# Patient Record
Sex: Male | Born: 1940 | Race: White | Hispanic: No | Marital: Married | Smoking: Former smoker
Health system: Southern US, Community
[De-identification: ages and names within clinical notes are randomized; demographics above are authoritative.]

## PROBLEM LIST (undated history)

## (undated) DIAGNOSIS — Z87891 Personal history of nicotine dependence: Secondary | ICD-10-CM

## (undated) DIAGNOSIS — I10 Essential (primary) hypertension: Secondary | ICD-10-CM

## (undated) DIAGNOSIS — C9211 Chronic myeloid leukemia, BCR/ABL-positive, in remission: Secondary | ICD-10-CM

## (undated) DIAGNOSIS — F419 Anxiety disorder, unspecified: Secondary | ICD-10-CM

## (undated) DIAGNOSIS — K219 Gastro-esophageal reflux disease without esophagitis: Secondary | ICD-10-CM

## (undated) DIAGNOSIS — K861 Other chronic pancreatitis: Secondary | ICD-10-CM

## (undated) DIAGNOSIS — G4733 Obstructive sleep apnea (adult) (pediatric): Secondary | ICD-10-CM

## (undated) DIAGNOSIS — R05 Cough: Secondary | ICD-10-CM

## (undated) DIAGNOSIS — C9210 Chronic myeloid leukemia, BCR/ABL-positive, not having achieved remission: Principal | ICD-10-CM

## (undated) DIAGNOSIS — R634 Abnormal weight loss: Secondary | ICD-10-CM

## (undated) DIAGNOSIS — G8929 Other chronic pain: Secondary | ICD-10-CM

## (undated) DIAGNOSIS — M549 Dorsalgia, unspecified: Secondary | ICD-10-CM

## (undated) DIAGNOSIS — IMO0002 Reserved for concepts with insufficient information to code with codable children: Secondary | ICD-10-CM

## (undated) DIAGNOSIS — R000 Tachycardia, unspecified: Principal | ICD-10-CM

## (undated) DIAGNOSIS — C259 Malignant neoplasm of pancreas, unspecified: Secondary | ICD-10-CM

## (undated) DIAGNOSIS — R079 Chest pain, unspecified: Secondary | ICD-10-CM

## (undated) DIAGNOSIS — K5900 Constipation, unspecified: Secondary | ICD-10-CM

## (undated) DIAGNOSIS — M79605 Pain in left leg: Secondary | ICD-10-CM

## (undated) DIAGNOSIS — K259 Gastric ulcer, unspecified as acute or chronic, without hemorrhage or perforation: Secondary | ICD-10-CM

## (undated) DIAGNOSIS — K1231 Oral mucositis (ulcerative) due to antineoplastic therapy: Principal | ICD-10-CM

## (undated) DIAGNOSIS — R110 Nausea: Principal | ICD-10-CM

## (undated) DIAGNOSIS — T7840XA Allergy, unspecified, initial encounter: Secondary | ICD-10-CM

## (undated) DIAGNOSIS — K859 Acute pancreatitis without necrosis or infection, unspecified: Secondary | ICD-10-CM

## (undated) DIAGNOSIS — Z923 Personal history of irradiation: Secondary | ICD-10-CM

## (undated) DIAGNOSIS — D72829 Elevated white blood cell count, unspecified: Secondary | ICD-10-CM

## (undated) DIAGNOSIS — E7800 Pure hypercholesterolemia, unspecified: Secondary | ICD-10-CM

## (undated) HISTORY — DX: Cough: R05

## (undated) HISTORY — PX: HERNIA REPAIR: SHX51

## (undated) HISTORY — DX: Constipation, unspecified: K59.00

## (undated) HISTORY — DX: Chronic myeloid leukemia, BCR/ABL-positive, not having achieved remission: C92.10

## (undated) HISTORY — DX: Gastric ulcer, unspecified as acute or chronic, without hemorrhage or perforation: K25.9

## (undated) HISTORY — DX: Personal history of nicotine dependence: Z87.891

## (undated) HISTORY — DX: Reserved for concepts with insufficient information to code with codable children: IMO0002

## (undated) HISTORY — DX: Personal history of irradiation: Z92.3

## (undated) HISTORY — DX: Acute pancreatitis without necrosis or infection, unspecified: K85.90

## (undated) HISTORY — PX: CATARACT EXTRACTION: SUR2

## (undated) HISTORY — DX: Pain in left leg: M79.605

## (undated) HISTORY — DX: Oral mucositis (ulcerative) due to antineoplastic therapy: K12.31

## (undated) HISTORY — PX: CHOLECYSTECTOMY: SHX55

## (undated) HISTORY — DX: Gastro-esophageal reflux disease without esophagitis: K21.9

## (undated) HISTORY — DX: Abnormal weight loss: R63.4

## (undated) HISTORY — DX: Elevated white blood cell count, unspecified: D72.829

## (undated) HISTORY — DX: Tachycardia, unspecified: R00.0

## (undated) HISTORY — PX: EYE SURGERY: SHX253

## (undated) HISTORY — DX: Chronic myeloid leukemia, BCR/ABL-positive, in remission: C92.11

## (undated) HISTORY — DX: Malignant neoplasm of pancreas, unspecified: C25.9

## (undated) HISTORY — DX: Nausea: R11.0

## (undated) HISTORY — PX: OTHER SURGICAL HISTORY: SHX169

## (undated) HISTORY — DX: Anxiety disorder, unspecified: F41.9

## (undated) HISTORY — DX: Obstructive sleep apnea (adult) (pediatric): G47.33

## (undated) HISTORY — DX: Chest pain, unspecified: R07.9

---

## 1998-04-03 ENCOUNTER — Inpatient Hospital Stay (HOSPITAL_COMMUNITY): Admission: EM | Admit: 1998-04-03 | Discharge: 1998-04-08 | Payer: Self-pay | Admitting: Emergency Medicine

## 2000-09-15 ENCOUNTER — Encounter: Admission: RE | Admit: 2000-09-15 | Discharge: 2000-09-15 | Payer: Self-pay | Admitting: Family Medicine

## 2000-09-15 ENCOUNTER — Encounter: Payer: Self-pay | Admitting: Family Medicine

## 2001-07-29 ENCOUNTER — Encounter: Admission: RE | Admit: 2001-07-29 | Discharge: 2001-07-29 | Payer: Self-pay | Admitting: General Surgery

## 2001-07-29 ENCOUNTER — Encounter: Payer: Self-pay | Admitting: General Surgery

## 2001-08-02 ENCOUNTER — Ambulatory Visit (HOSPITAL_BASED_OUTPATIENT_CLINIC_OR_DEPARTMENT_OTHER): Admission: RE | Admit: 2001-08-02 | Discharge: 2001-08-02 | Payer: Self-pay | Admitting: General Surgery

## 2002-03-09 ENCOUNTER — Encounter (INDEPENDENT_AMBULATORY_CARE_PROVIDER_SITE_OTHER): Payer: Self-pay | Admitting: *Deleted

## 2002-03-09 ENCOUNTER — Ambulatory Visit (HOSPITAL_COMMUNITY): Admission: RE | Admit: 2002-03-09 | Discharge: 2002-03-09 | Payer: Self-pay | Admitting: Gastroenterology

## 2002-05-15 ENCOUNTER — Encounter: Admission: RE | Admit: 2002-05-15 | Discharge: 2002-05-15 | Payer: Self-pay | Admitting: *Deleted

## 2002-05-15 ENCOUNTER — Encounter: Payer: Self-pay | Admitting: *Deleted

## 2002-05-23 ENCOUNTER — Encounter: Admission: RE | Admit: 2002-05-23 | Discharge: 2002-05-23 | Payer: Self-pay | Admitting: Internal Medicine

## 2002-05-23 ENCOUNTER — Encounter: Payer: Self-pay | Admitting: Internal Medicine

## 2002-11-16 DIAGNOSIS — K859 Acute pancreatitis without necrosis or infection, unspecified: Secondary | ICD-10-CM

## 2002-11-16 HISTORY — DX: Acute pancreatitis without necrosis or infection, unspecified: K85.90

## 2003-05-30 ENCOUNTER — Encounter: Payer: Self-pay | Admitting: Emergency Medicine

## 2003-05-31 ENCOUNTER — Observation Stay (HOSPITAL_COMMUNITY): Admission: EM | Admit: 2003-05-31 | Discharge: 2003-05-31 | Payer: Self-pay | Admitting: Emergency Medicine

## 2003-11-17 DIAGNOSIS — IMO0002 Reserved for concepts with insufficient information to code with codable children: Secondary | ICD-10-CM

## 2003-11-17 HISTORY — DX: Reserved for concepts with insufficient information to code with codable children: IMO0002

## 2003-11-25 ENCOUNTER — Emergency Department (HOSPITAL_COMMUNITY): Admission: EM | Admit: 2003-11-25 | Discharge: 2003-11-25 | Payer: Self-pay | Admitting: Emergency Medicine

## 2004-04-28 ENCOUNTER — Encounter: Admission: RE | Admit: 2004-04-28 | Discharge: 2004-07-27 | Payer: Self-pay | Admitting: Internal Medicine

## 2004-06-13 ENCOUNTER — Ambulatory Visit (HOSPITAL_COMMUNITY): Admission: RE | Admit: 2004-06-13 | Discharge: 2004-06-13 | Payer: Self-pay | Admitting: Chiropractic Medicine

## 2006-12-02 ENCOUNTER — Encounter: Payer: Self-pay | Admitting: Vascular Surgery

## 2006-12-02 ENCOUNTER — Ambulatory Visit (HOSPITAL_COMMUNITY): Admission: RE | Admit: 2006-12-02 | Discharge: 2006-12-02 | Payer: Self-pay | Admitting: Internal Medicine

## 2007-01-17 ENCOUNTER — Encounter: Admission: RE | Admit: 2007-01-17 | Discharge: 2007-01-17 | Payer: Self-pay | Admitting: Internal Medicine

## 2007-02-07 ENCOUNTER — Encounter: Admission: RE | Admit: 2007-02-07 | Discharge: 2007-02-07 | Payer: Self-pay | Admitting: Internal Medicine

## 2008-11-16 DIAGNOSIS — G4733 Obstructive sleep apnea (adult) (pediatric): Secondary | ICD-10-CM

## 2008-11-16 HISTORY — DX: Obstructive sleep apnea (adult) (pediatric): G47.33

## 2009-06-21 ENCOUNTER — Ambulatory Visit (HOSPITAL_BASED_OUTPATIENT_CLINIC_OR_DEPARTMENT_OTHER): Admission: RE | Admit: 2009-06-21 | Discharge: 2009-06-21 | Payer: Self-pay | Admitting: Orthopedic Surgery

## 2009-08-10 ENCOUNTER — Inpatient Hospital Stay (HOSPITAL_COMMUNITY): Admission: EM | Admit: 2009-08-10 | Discharge: 2009-08-12 | Payer: Self-pay | Admitting: Emergency Medicine

## 2010-07-24 ENCOUNTER — Ambulatory Visit (HOSPITAL_COMMUNITY): Admission: RE | Admit: 2010-07-24 | Discharge: 2010-07-24 | Payer: Self-pay | Admitting: Gastroenterology

## 2010-09-09 ENCOUNTER — Inpatient Hospital Stay (HOSPITAL_COMMUNITY): Admission: EM | Admit: 2010-09-09 | Discharge: 2010-09-10 | Payer: Self-pay | Admitting: Emergency Medicine

## 2010-11-16 HISTORY — PX: COLONOSCOPY: SHX174

## 2010-11-16 HISTORY — PX: OTHER SURGICAL HISTORY: SHX169

## 2011-01-28 LAB — LIPID PANEL
Cholesterol: 93 mg/dL (ref 0–200)
HDL: 32 mg/dL — ABNORMAL LOW (ref >39)

## 2011-01-28 LAB — GLUCOSE, CAPILLARY: Glucose-Capillary: 293 mg/dL — ABNORMAL HIGH (ref 70–99)

## 2011-01-28 LAB — COMPREHENSIVE METABOLIC PANEL
ALT: 26 U/L (ref 0–53)
AST: 22 U/L (ref 0–37)
Albumin: 3 g/dL — ABNORMAL LOW (ref 3.5–5.2)
Albumin: 3.4 g/dL — ABNORMAL LOW (ref 3.5–5.2)
Alkaline Phosphatase: 49 U/L (ref 39–117)
BUN: 9 mg/dL (ref 6–23)
Calcium: 8.9 mg/dL (ref 8.4–10.5)
Chloride: 105 mEq/L (ref 96–112)
Creatinine, Ser: 1.09 mg/dL (ref 0.4–1.5)
Creatinine, Ser: 1.3 mg/dL (ref 0.4–1.5)
GFR calc Af Amer: >60 mL/min (ref >60)
Potassium: 4.5 mEq/L (ref 3.5–5.1)
Potassium: 4.7 mEq/L (ref 3.5–5.1)
Sodium: 136 mEq/L (ref 135–145)
Total Bilirubin: 1.3 mg/dL — ABNORMAL HIGH (ref 0.3–1.2)
Total Protein: 5.3 g/dL — ABNORMAL LOW (ref 6.0–8.3)

## 2011-01-28 LAB — URINALYSIS, ROUTINE W REFLEX MICROSCOPIC
Nitrite: NEGATIVE
Specific Gravity, Urine: 1.01 (ref 1.005–1.030)
pH: 5 (ref 5.0–8.0)

## 2011-01-28 LAB — DIFFERENTIAL
Basophils Absolute: 0.1 10*3/uL (ref 0.0–0.1)
Eosinophils Relative: 0 % (ref 0–5)
Lymphocytes Relative: 13 % (ref 12–46)
Monocytes Absolute: 0.9 10*3/uL (ref 0.1–1.0)

## 2011-01-28 LAB — CBC
HCT: 29.9 % — ABNORMAL LOW (ref 39.0–52.0)
HCT: 32.4 % — ABNORMAL LOW (ref 39.0–52.0)
Hemoglobin: 11.2 g/dL — ABNORMAL LOW (ref 13.0–17.0)
MCH: 32.7 pg (ref 26.0–34.0)
MCH: 32.9 pg (ref 26.0–34.0)
MCHC: 34.2 g/dL (ref 30.0–36.0)
MCHC: 34.8 g/dL (ref 30.0–36.0)
MCV: 95 fL (ref 78.0–100.0)
Platelets: 174 10*3/uL (ref 150–400)
Platelets: 200 10*3/uL (ref 150–400)
RBC: 3.88 MIL/uL — ABNORMAL LOW (ref 4.22–5.81)
RDW: 13.3 % (ref 11.5–15.5)
RDW: 13.4 % (ref 11.5–15.5)
RDW: 13.5 % (ref 11.5–15.5)
WBC: 10.6 10*3/uL — ABNORMAL HIGH (ref 4.0–10.5)
WBC: 11.1 10*3/uL — ABNORMAL HIGH (ref 4.0–10.5)
WBC: 9.1 10*3/uL (ref 4.0–10.5)

## 2011-01-28 LAB — TYPE AND SCREEN
ABO/RH(D): A POS
Antibody Screen: NEGATIVE

## 2011-01-28 LAB — CK TOTAL AND CKMB (NOT AT ARMC): Relative Index: INVALID (ref 0.0–2.5)

## 2011-01-28 LAB — HEMOGLOBIN A1C: Hgb A1c MFr Bld: 7.3 % — ABNORMAL HIGH (ref <5.7)

## 2011-01-28 LAB — ABO/RH: ABO/RH(D): A POS

## 2011-01-28 LAB — URINE CULTURE: Culture  Setup Time: 201110260129

## 2011-01-28 LAB — HEMOCCULT GUIAC POC 1CARD (OFFICE): Fecal Occult Bld: POSITIVE

## 2011-01-28 LAB — BRAIN NATRIURETIC PEPTIDE: Pro B Natriuretic peptide (BNP): <30 pg/mL (ref 0.0–100.0)

## 2011-01-29 LAB — GLUCOSE, CAPILLARY: Glucose-Capillary: 169 mg/dL — ABNORMAL HIGH (ref 70–99)

## 2011-02-18 ENCOUNTER — Other Ambulatory Visit: Payer: Self-pay | Admitting: Dermatology

## 2011-02-20 LAB — BASIC METABOLIC PANEL
BUN: 44 mg/dL — ABNORMAL HIGH (ref 6–23)
CO2: 23 mEq/L (ref 19–32)
CO2: 24 mEq/L (ref 19–32)
Calcium: 8.4 mg/dL (ref 8.4–10.5)
Calcium: 8.7 mg/dL (ref 8.4–10.5)
Calcium: 9 mg/dL (ref 8.4–10.5)
Creatinine, Ser: 1.25 mg/dL (ref 0.4–1.5)
GFR calc Af Amer: >60 mL/min (ref >60)
GFR calc Af Amer: >60 mL/min (ref >60)
GFR calc non Af Amer: 43 mL/min — ABNORMAL LOW (ref >60)
GFR calc non Af Amer: 58 mL/min — ABNORMAL LOW (ref >60)
Glucose, Bld: 109 mg/dL — ABNORMAL HIGH (ref 70–99)
Potassium: 3.6 mEq/L (ref 3.5–5.1)
Potassium: 4.3 mEq/L (ref 3.5–5.1)
Sodium: 140 mEq/L (ref 135–145)
Sodium: 140 mEq/L (ref 135–145)
Sodium: 140 mEq/L (ref 135–145)

## 2011-02-20 LAB — DIFFERENTIAL
Basophils Absolute: 0 10*3/uL (ref 0.0–0.1)
Basophils Absolute: 0.1 10*3/uL (ref 0.0–0.1)
Basophils Relative: 0 % (ref 0–1)
Lymphocytes Relative: 19 % (ref 12–46)
Lymphocytes Relative: 24 % (ref 12–46)
Lymphs Abs: 1.9 10*3/uL (ref 0.7–4.0)
Monocytes Absolute: 1.2 10*3/uL — ABNORMAL HIGH (ref 0.1–1.0)
Neutro Abs: 4.5 10*3/uL (ref 1.7–7.7)
Neutro Abs: 7 10*3/uL (ref 1.7–7.7)

## 2011-02-20 LAB — URINE CULTURE

## 2011-02-20 LAB — CBC
HCT: 43.6 % (ref 39.0–52.0)
HCT: 44.9 % (ref 39.0–52.0)
Hemoglobin: 15.5 g/dL (ref 13.0–17.0)
MCV: 99.2 fL (ref 78.0–100.0)
Platelets: 181 10*3/uL (ref 150–400)
RBC: 4.53 MIL/uL (ref 4.22–5.81)
RDW: 13.7 % (ref 11.5–15.5)
WBC: 10.4 10*3/uL (ref 4.0–10.5)
WBC: 7.7 10*3/uL (ref 4.0–10.5)

## 2011-02-20 LAB — URINALYSIS, ROUTINE W REFLEX MICROSCOPIC
Ketones, ur: 15 mg/dL — AB
Nitrite: NEGATIVE
Protein, ur: 30 mg/dL — AB
Urobilinogen, UA: 1 mg/dL (ref 0.0–1.0)
pH: 5 (ref 5.0–8.0)

## 2011-02-20 LAB — CLOSTRIDIUM DIFFICILE EIA: C difficile Toxins A+B, EIA: NEGATIVE

## 2011-02-20 LAB — GLUCOSE, CAPILLARY
Glucose-Capillary: 100 mg/dL — ABNORMAL HIGH (ref 70–99)
Glucose-Capillary: 107 mg/dL — ABNORMAL HIGH (ref 70–99)
Glucose-Capillary: 116 mg/dL — ABNORMAL HIGH (ref 70–99)
Glucose-Capillary: 150 mg/dL — ABNORMAL HIGH (ref 70–99)
Glucose-Capillary: 94 mg/dL (ref 70–99)
Glucose-Capillary: 97 mg/dL (ref 70–99)

## 2011-02-20 LAB — COMPREHENSIVE METABOLIC PANEL
Albumin: 4.1 g/dL (ref 3.5–5.2)
Alkaline Phosphatase: 73 U/L (ref 39–117)
BUN: 53 mg/dL — ABNORMAL HIGH (ref 6–23)
CO2: 25 mEq/L (ref 19–32)
Chloride: 105 mEq/L (ref 96–112)
Creatinine, Ser: 2.09 mg/dL — ABNORMAL HIGH (ref 0.4–1.5)
GFR calc non Af Amer: 32 mL/min — ABNORMAL LOW (ref >60)
Glucose, Bld: 141 mg/dL — ABNORMAL HIGH (ref 70–99)
Potassium: 4.3 mEq/L (ref 3.5–5.1)
Total Bilirubin: 1.3 mg/dL — ABNORMAL HIGH (ref 0.3–1.2)

## 2011-02-20 LAB — URINE MICROSCOPIC-ADD ON

## 2011-02-20 LAB — LIPASE, BLOOD: Lipase: 21 U/L (ref 11–59)

## 2011-02-21 LAB — POCT HEMOGLOBIN-HEMACUE: Hemoglobin: 15.9 g/dL (ref 13.0–17.0)

## 2011-02-21 LAB — BASIC METABOLIC PANEL
CO2: 29 mEq/L (ref 19–32)
GFR calc Af Amer: >60 mL/min (ref >60)
GFR calc non Af Amer: >60 mL/min (ref >60)
Glucose, Bld: 209 mg/dL — ABNORMAL HIGH (ref 70–99)
Potassium: 4.6 mEq/L (ref 3.5–5.1)
Sodium: 139 mEq/L (ref 135–145)

## 2011-02-21 LAB — GLUCOSE, CAPILLARY: Glucose-Capillary: 154 mg/dL — ABNORMAL HIGH (ref 70–99)

## 2011-03-31 NOTE — Op Note (Signed)
NAME:  Evan [name], MOTEN NO.:  [id number]   MEDICAL RECORD NO.:  [id number]          PATIENT TYPE:  AMB   LOCATION:  DSC                          FACILITY:  [facility]   PHYSICIAN:  Harvie Junior, M.D.   DATE OF BIRTH:  1941-07-27   DATE OF PROCEDURE:  06/21/2009  DATE OF DISCHARGE:                               OPERATIVE REPORT   PREOPERATIVE DIAGNOSES:  Impingement acromioclavicular arthritis and  possible rotator cuff tear.   POSTOPERATIVE DIAGNOSES:  1. Impingement.  2. Acromioclavicular arthritis.  3. Tight shoulder with superior labral tearing anterior to posterior      and inferior glenoid wear.   PRINCIPAL PROCEDURES:  1. Arthroscopic subacromial decompression from the lateral and      posterior compartment.  2. Arthroscopic distal clavicle resection through an anterior      compartment.  3. Arthroscopic debridement of superior labral tear anterior to      posterior and inferior glenoid wear.  4. Manipulation of arm under anesthesia.   SURGEON:  Harvie Junior, MD   ASSISTANT:  Marshia Ly, PA   ANESTHESIA:  General.   BRIEF HISTORY:  Mr. Cherne is a 70 year old male with a long history  of a significant right shoulder pain.  We treated him conservatively for  a period of time with activity modification and anti-inflammatory  medication.  Also, we did a subacromial injection which gave good  relief.  Because of recurrence of pain and continued symptoms and  failure of conservative care, the patient also had been taken to the  operating room for operative shoulder arthroscopy procedure.   The patient was taken to the operating room.  After adequate anesthesia  obtained with general anesthetic, the patient was placed supine on the  operating table.  The right arm was prepped and draped in the usual  sterile fashion.  Following this, the routine arthroscopic examination  of the shoulder revealed there was an obvious labral tear anterior to  posterior.  Initially, we first anesthetized the patient, we manipulated  the arm.  He did have a significant limitation over elevation and  external rotation and internal rotation, all of which were manipulated.  Going into the shoulder, we found the labrum frayed and torn in the  superior portion which was debrided inferiorly.  The glenoid was worn  and had some cartilaginous injury inferiorly.  Humeral head looked quite  good.  Attention was then turned out of the glenohumeral joint.  Biceps  tendon was interesting in that it was a little bit loose, could almost  push down into the joint.  I gave consideration to doing a tenolysis,  but ultimately it felt like it was functioning, had some capacity.  The  rotator cuff looked great, so we felt that the biceps tendon would be  reasonable to leave at this point.  When out of the glenohumeral joint  and the subacromial space and basically did a large acromioplasty, did a  large distal clavicle resection over 18 mm, did a thorough debridement  of the rotator cuff from the top side, did essentially a total  bursectomy.  Once this was completed, the rotator cuff was evaluated  from the top side and felt to be normal  at this point.  The shoulder was thoroughly and copiously irrigated and  suctioned dry.  The arthroscopic portals were closed with a bandage.  A  sterile compressive dressing was applied.  The patient was taken to the  recovery room and was noted to be in satisfactory condition.      Harvie Junior, M.D.  Electronically Signed     JLG/MEDQ  D:  06/21/2009  T:  06/21/2009  Job:  [id number]

## 2011-04-03 NOTE — Procedures (Signed)
[place]  Patient:    Evan [name], Evan [name]. Visit Number: [id number] MRN: [id number]          Service Type: END Location: ENDO Attending Physician:  Dennison Bulla Ii Dictated by:   Verlin Grills, M.D. Proc. Date: 03/09/02 Admit Date:  03/09/2002 Discharge Date: 03/09/2002   CC:         Dr. Armstead Peaks   Procedure Report  PROCEDURE:  Colonoscopy with polypectomy.  REFERRING PHYSICIAN:  Dr. Armstead Peaks.  INDICATIONS FOR PROCEDURE:  The patient is a 70 year old male born 1941/01/11.  The patient is referred for his first screening colonoscopy with polypectomy to prevent colon cancer.  I discussed with the patient the complications associated with colonoscopy and polypectomy including a 15 per 1000 risk of bleeding and 4 per 1000 risk of colon perforation requiring surgical repair.  The patient has signed the operative permit.  ENDOSCOPIST:  Verlin Grills, M.D.  PREMEDICATION:  Fentanyl 50 mcg and Versed 5 mg.  ENDOSCOPE:  Olympus pediatric colonoscope.  DESCRIPTION OF PROCEDURE:  After obtaining informed consent, the patient was placed in the left lateral decubitus position.  I administered intravenous fentanyl and intravenous Versed to achieve conscious sedation for the procedure.  The patients blood pressure, oxygen saturation, and cardiac rhythm were monitored throughout the procedure, and documented in the medical record.  Anal inspection was normal.  Digital rectal exam revealed a non-nodular prostate.  The Olympus pediatric video colonoscope was introduced into the rectum and easily advanced to the cecum.  Colonic preparation for the exam today was satisfactory.  Rectum:  There are multiple 0.5 mm hyperplastic-appearing polyps in the rectum.  A few of these polyps were removed with the hot biopsy forceps.  Sigmoid colon, descending colon, and splenic flexure:  From the splenic flexure, a 2 mm  sessile polyp was removed with the hot biopsy forceps.  From the proximal sigmoid colon, a 3 mm sessile polyp was removed with electrocautery snare, but the polyp was not retrieved for pathology.  From the mid sigmoid colon, a 2 mm sessile polyp was removed with the hot biopsy forceps.  Transverse colon normal.  Hepatic flexure normal.  Ascending colon normal.  Cecum and ileocecal valve normal.  ASSESSMENT:  Multiple small polyps were removed from the rectum, sigmoid colon, and descending colon.  All polyps were submitted in one bottle for pathological evaluation.  The patient has a number of small (0.5 mm) polyps throughout his rectum which were not removed.  RECOMMENDATIONS:  Repeat colonoscopy in three years. Dictated by:   Verlin Grills, M.D. Attending Physician:  Dennison Bulla Ii DD:  03/09/02 TD:  03/09/02 Job: [id number]

## 2011-04-03 NOTE — Op Note (Signed)
[place]  Patient:    Evan [name], Evan [name]. Visit Number: [id number] MRN: [id number]          Service Type: DSU Location: [facility] Attending Physician:  Henrene Dodge Dictated by:   Anselm Pancoast. Zachery Dakins, M.D. Proc. Date: 08/02/01 Admit Date:  08/02/2001                             Operative Report  PREOPERATIVE DIAGNOSIS:  Umbilical hernia.  POSTOPERATIVE DIAGNOSIS:  Umbilical hernia.  OPERATION PERFORMED:  Repair of umbilical hernia with mesh reinforcement.  SURGEON:  Juanito Gonyer. Zachery Dakins, M.D.  ANESTHESIA:  General.  INDICATIONS FOR PROCEDURE:  The patient is a 70 year old slightly overweight Caucasian male who was referred to [place] by Dr. Yetta Barre for management of a symptomatic hernia.  He had had a previous cholecystectomy by Dr. Derrell Lolling approximately three years ago.  No weakness or pain at the navel until recently when he noted the bulge.  His previous incision had been a little transverse subumbilical incision.  He has an obvious fascial defect at the umbilicus.  I recommended we repair this with mesh reinforcement in the preperitoneal space and the patient was in agreement.  DESCRIPTION OF PROCEDURE:  The patient was taken to an operating suite. Induction of general anesthesia.  I opened the little transverse incision a little larger than it had been previously and then dissected the hernia sac from the overlying skin.  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  I then created a little subcutaneous subfascial pocket circumferentially and a little piece of mesh about 2 x 3 inches was placed in this and anchored in four corners with 3-0 Prolene. Next, closed the fascia over the mesh with five sutures of 0 Novofil and Surgilon and this was lying flat without tension.  I then anesthetized the fascia with about 12 cc  of Marcaine with Adrenalin.  I then closed the subcutaneous tissue with interrupted 3-0 Vicryl and then 5-0 nylon on the skin.  I then anesthetized the skin, all told about 20 cc of solution used. The patient will released after a short stay in the recovery room and I will see him in the office in approximately seven to 10 days.  I would think that he can return to work in two weeks without restrictions and we will make that decision at the time of his visit in the office. Dictated by:   Anselm Pancoast. Zachery Dakins, M.D. Attending Physician:  Henrene Dodge DD:  08/02/01 TD:  08/02/01 Job: [id number]

## 2012-01-24 ENCOUNTER — Encounter (HOSPITAL_COMMUNITY): Payer: Self-pay | Admitting: *Deleted

## 2012-01-24 ENCOUNTER — Emergency Department (HOSPITAL_COMMUNITY): Payer: Medicare Other

## 2012-01-24 ENCOUNTER — Emergency Department (HOSPITAL_COMMUNITY)
Admission: EM | Admit: 2012-01-24 | Discharge: 2012-01-24 | Disposition: A | Discharge status: Home / Self Care | Payer: Medicare Other | Attending: Emergency Medicine | Admitting: Emergency Medicine

## 2012-01-24 DIAGNOSIS — I10 Essential (primary) hypertension: Secondary | ICD-10-CM | POA: Insufficient documentation

## 2012-01-24 DIAGNOSIS — M5430 Sciatica, unspecified side: Secondary | ICD-10-CM

## 2012-01-24 DIAGNOSIS — S335XXA Sprain of ligaments of lumbar spine, initial encounter: Secondary | ICD-10-CM | POA: Insufficient documentation

## 2012-01-24 DIAGNOSIS — S39012A Strain of muscle, fascia and tendon of lower back, initial encounter: Secondary | ICD-10-CM

## 2012-01-24 DIAGNOSIS — E119 Type 2 diabetes mellitus without complications: Secondary | ICD-10-CM | POA: Insufficient documentation

## 2012-01-24 DIAGNOSIS — Z794 Long term (current) use of insulin: Secondary | ICD-10-CM | POA: Insufficient documentation

## 2012-01-24 DIAGNOSIS — M5380 Other specified dorsopathies, site unspecified: Secondary | ICD-10-CM | POA: Insufficient documentation

## 2012-01-24 DIAGNOSIS — X503XXA Overexertion from repetitive movements, initial encounter: Secondary | ICD-10-CM | POA: Insufficient documentation

## 2012-01-24 HISTORY — DX: Pure hypercholesterolemia, unspecified: E78.00

## 2012-01-24 HISTORY — DX: Reserved for concepts with insufficient information to code with codable children: IMO0002

## 2012-01-24 HISTORY — DX: Other chronic pain: G89.29

## 2012-01-24 HISTORY — DX: Dorsalgia, unspecified: M54.9

## 2012-01-24 HISTORY — DX: Essential (primary) hypertension: I10

## 2012-01-24 MED ORDER — OXYCODONE-ACETAMINOPHEN 5-325 MG PO TABS
1.0000 | ORAL_TABLET | Freq: Once | ORAL | Status: AC
  Administered 2012-01-24: 1 via ORAL
  Filled 2012-01-24: qty 1

## 2012-01-24 MED ORDER — DIAZEPAM 5 MG PO TABS: 5.0000 mg | ORAL_TABLET | Freq: Two times a day (BID) | ORAL | Status: AC | PRN

## 2012-01-24 MED ORDER — KETOROLAC TROMETHAMINE 30 MG/ML IJ SOLN
30.0000 mg | Freq: Once | INTRAMUSCULAR | Status: AC
  Administered 2012-01-24: 30 mg via INTRAMUSCULAR
  Filled 2012-01-24: qty 1

## 2012-01-24 MED ORDER — PREDNISONE 10 MG PO TABS: 20.0000 mg | ORAL_TABLET | Freq: Every day | ORAL | Status: DC

## 2012-01-24 MED ORDER — HYDROCODONE-ACETAMINOPHEN 5-325 MG PO TABS: 1.0000 | ORAL_TABLET | Freq: Four times a day (QID) | ORAL | Status: AC | PRN

## 2012-01-24 NOTE — Discharge Instructions (Signed)
Return here for any worsening in your condition.  Your  x-rays show degenerative changes in your lower back.  Followup with her doctors would be important for further evaluation.

## 2012-01-24 NOTE — ED Provider Notes (Signed)
Medical screening examination/treatment/procedure(s) were performed by non-physician practitioner and as supervising physician I was immediately available for consultation/collaboration.   Halo Shevlin, MD 01/24/12 1739 

## 2012-01-24 NOTE — ED Provider Notes (Signed)
History     CSN: [id number]  Arrival date & time 01/24/12  1144   First MD Initiated Contact with Patient 01/24/12 1210      Chief Complaint  Patient presents with  . Back Pain    middle, right    (Consider location/radiation/quality/duration/timing/severity/associated sxs/prior treatment) HPI Patient presents to the emergency department with onset of lower back pain after vacuuming 2 cars on Friday.  He has had chronic back pain with multiple level degenerative disc disease.  Patient states he has been seen and evaluated for that in the past.  He states today the pain increased and he started having some radiating pain into his right leg.  Patient denies weakness of extremities, numbness, abdominal pain, nausea/vomiting, fever, or bowel or bladder issues/ incontinence. Past Medical History  Diagnosis Date  . Chronic back pain greater than 3 months duration   . Bulging discs   . Diabetes mellitus   . Hypertension   . High cholesterol   . Cancer     Past Surgical History  Procedure Date  . Cholecystectomy   . Hernia repair   . Skin cancer removed   . Spinal stretching     History reviewed. No pertinent family history.  History  Substance Use Topics  . Smoking status: Former Smoker    Quit date: 01/24/2004  . Smokeless tobacco: Never Used  . Alcohol Use: No      Review of Systems All pertinent positives and negatives reviewed in the history of present illness  Allergies  Review of patient's allergies indicates no known allergies.  Home Medications   Current Outpatient Rx  Name Route Sig Dispense Refill  . ASPIRIN EC 81 MG PO TBEC Oral Take 81 mg by mouth daily.    . INSULIN GLARGINE 100 UNIT/ML [place] SOLN Subcutaneous Inject 28 Units into the skin at bedtime.    Marland Kitchen METFORMIN HCL 1000 MG PO TABS Oral Take 1,000 mg by mouth 2 (two) times daily with a meal.      BP 133/73  Pulse 89  Temp(Src) 98 F (36.7 C) (Oral)  Resp 18  Ht 6\' 2"  (1.88 m)  Wt 255 lb  (115.667 kg)  BMI 32.74 kg/m2  SpO2 95%  Physical Exam  Constitutional: He is oriented to person, place, and time. He appears well-developed and well-nourished. No distress.  HENT:  Head: Normocephalic and atraumatic.  Eyes: Pupils are equal, round, and reactive to light.  Cardiovascular: Normal rate, regular rhythm and normal heart sounds.  Exam reveals no gallop and no friction rub.   No murmur heard. Pulmonary/Chest: Effort normal and breath sounds normal. No respiratory distress.  Musculoskeletal:       Lumbar back: He exhibits decreased range of motion, tenderness, pain and spasm. He exhibits no bony tenderness, no swelling and no deformity.       Back:  Neurological: He is alert and oriented to person, place, and time. He has normal strength. No sensory deficit. Coordination and gait normal.  Reflex Scores:      Patellar reflexes are 2+ on the right side and 2+ on the left side.      Achilles reflexes are 2+ on the right side and 2+ on the left side. Skin: Skin is warm and dry. No rash noted.    ED Course  Procedures (including critical care time)  Labs Reviewed - No data to display Dg Lumbar Spine Complete  01/24/2012  *RADIOLOGY REPORT*  Clinical Data: Back pain.  LUMBAR SPINE -  COMPLETE 4+ VIEW  Comparison: CT scan 09/09/2010.  Findings: Normal alignment of the lumbar vertebral bodies. Degenerative disc disease and degenerative set disease appears stable.  No pars defects.  No acute bony findings or destructive bony changes.  Advanced vascular calcifications are noted.  IMPRESSION: Stable degenerative changes.  No acute bony findings.  Original Report Authenticated By: P. Loralie Champagne, M.D.     Patient most likely has some irritation of his chronic lower back condition.  He is advised followup with his primary care Dr. for recheck.  Is not have any neurological or sensory deficits on exam.  He has normal strength in his lower extremities along with normal reflexes.  Patient  is advised to return here for any worsening in his condition.  Advised in these ice and heat on his lower back.  I explained the importance of primary care followup with me may need to do further testing such as outpatient MRI.  Patient agrees to plan and all questions were answered.    MDM  MDM Reviewed: nursing note and vitals Interpretation: x-ray            Carlyle Dolly, PA-C 01/24/12 1359

## 2012-01-24 NOTE — ED Notes (Signed)
Pt from home with reports of worsening middle, right back pain since Friday, pt reports hx of chronic back pain and bulging discs since 2005 and sees [facility] "for stretching". Pt also reports "over doing it" on Friday while vaccuming and cleaning out his truck.

## 2012-07-29 ENCOUNTER — Telehealth: Payer: Self-pay | Admitting: Oncology

## 2012-07-29 ENCOUNTER — Encounter: Payer: Self-pay | Admitting: Oncology

## 2012-07-29 DIAGNOSIS — D72829 Elevated white blood cell count, unspecified: Secondary | ICD-10-CM | POA: Insufficient documentation

## 2012-07-29 NOTE — Telephone Encounter (Signed)
C/D on 9/13 for appt 9/18

## 2012-07-29 NOTE — Telephone Encounter (Signed)
R/s appt to 08/01/12 per Dr. Gaylyn Rong, pt aware per MD

## 2012-07-29 NOTE — Telephone Encounter (Signed)
S/W pt wife Evan [name] in re NP appt 9/18 @ 10:30 w/ Dr. Gaylyn Rong Referring Dr. Kirby Funk . 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 NP packet mail.

## 2012-07-31 NOTE — Patient Instructions (Addendum)
1.  Diagnosis:  Elevated white blood cell count WBC (leukocytosis) 2.  Causes: to be determined.  Possibilities:  Acute leukemia vs. Chronic leukemia.  3.  Work up:  Strongly recommend a bone marrow biopsy ASAP for diagnostic and prognostic purposes.  Bone marrow biopsy is scheduled for tomorrow at the [facility] at 9am.  Please show up at 8:50am.  4.  Treatment:  Pending result of bone marrow biopsy. 5.  Follow up:  Friday 08/05/2012 but sooner if result comes out sooner.

## 2012-08-01 ENCOUNTER — Other Ambulatory Visit: Payer: Self-pay | Admitting: *Deleted

## 2012-08-01 ENCOUNTER — Other Ambulatory Visit: Payer: Self-pay | Admitting: Oncology

## 2012-08-01 ENCOUNTER — Other Ambulatory Visit (HOSPITAL_BASED_OUTPATIENT_CLINIC_OR_DEPARTMENT_OTHER): Payer: Medicare Other | Admitting: Lab

## 2012-08-01 ENCOUNTER — Ambulatory Visit (HOSPITAL_COMMUNITY)
Admission: RE | Admit: 2012-08-01 | Discharge: 2012-08-01 | Disposition: A | Discharge status: Home / Self Care | Payer: Medicare Other | Source: Ambulatory Visit | Attending: Oncology | Admitting: Oncology

## 2012-08-01 ENCOUNTER — Encounter: Payer: Self-pay | Admitting: Oncology

## 2012-08-01 ENCOUNTER — Ambulatory Visit: Payer: Medicare Other

## 2012-08-01 ENCOUNTER — Telehealth: Payer: Self-pay | Admitting: Oncology

## 2012-08-01 ENCOUNTER — Ambulatory Visit (HOSPITAL_BASED_OUTPATIENT_CLINIC_OR_DEPARTMENT_OTHER): Payer: Medicare Other | Admitting: Oncology

## 2012-08-01 VITALS — BP 134/76 | HR 110 | Temp 98.0°F | Resp 20 | Ht 72.0 in | Wt 240.2 lb

## 2012-08-01 DIAGNOSIS — D720 Genetic anomalies of leukocytes: Secondary | ICD-10-CM

## 2012-08-01 DIAGNOSIS — R634 Abnormal weight loss: Secondary | ICD-10-CM

## 2012-08-01 DIAGNOSIS — D72829 Elevated white blood cell count, unspecified: Secondary | ICD-10-CM

## 2012-08-01 DIAGNOSIS — R059 Cough, unspecified: Secondary | ICD-10-CM | POA: Insufficient documentation

## 2012-08-01 DIAGNOSIS — Z87891 Personal history of nicotine dependence: Secondary | ICD-10-CM

## 2012-08-01 DIAGNOSIS — R05 Cough: Secondary | ICD-10-CM

## 2012-08-01 DIAGNOSIS — R1012 Left upper quadrant pain: Secondary | ICD-10-CM

## 2012-08-01 HISTORY — DX: Personal history of nicotine dependence: Z87.891

## 2012-08-01 HISTORY — DX: Cough, unspecified: R05.9

## 2012-08-01 HISTORY — DX: Abnormal weight loss: R63.4

## 2012-08-01 LAB — MANUAL DIFFERENTIAL
EOS: 1 % (ref 0–7)
MONO: 0 % (ref 0–14)
Metamyelocytes: 8 % — ABNORMAL HIGH (ref 0–0)
Myelocytes: 13 % — ABNORMAL HIGH (ref 0–0)

## 2012-08-01 LAB — FIBRINOGEN: Fibrinogen: 541 mg/dL — ABNORMAL HIGH (ref 204–475)

## 2012-08-01 LAB — COMPREHENSIVE METABOLIC PANEL (CC13)
Albumin: 3.9 g/dL (ref 3.5–5.0)
Alkaline Phosphatase: 120 U/L (ref 40–150)
BUN: 17 mg/dL (ref 7.0–26.0)
Calcium: 10.8 mg/dL — ABNORMAL HIGH (ref 8.4–10.4)
Chloride: 105 mEq/L (ref 98–107)
Glucose: 244 mg/dl — ABNORMAL HIGH (ref 70–99)
Potassium: 4.4 mEq/L (ref 3.5–5.1)

## 2012-08-01 LAB — CBC WITH DIFFERENTIAL/PLATELET
HCT: 41.4 % (ref 38.4–49.9)
MCHC: 31.5 g/dL — ABNORMAL LOW (ref 32.0–36.0)
Platelets: 380 10*3/uL (ref 140–400)
RBC: 4.36 10*6/uL (ref 4.20–5.82)
WBC: 141.2 10*3/uL (ref 4.0–10.3)

## 2012-08-01 LAB — PROTHROMBIN TIME: Prothrombin Time: 14.8 seconds (ref 11.6–15.2)

## 2012-08-01 NOTE — Telephone Encounter (Signed)
appts made and printed for pt, °

## 2012-08-01 NOTE — Progress Notes (Signed)
[facility]  [phone] [phone]     INITIAL HEMATOLOGY CONSULTATION    Referral MD:  Dr. Lillia Mountain, M.D.   Reason for Referral: neutrophilic leukocytosis.   HPI:  Mr. Evan [name]. Evan [name] is a 71 year-old man with history of type II DM, HTN, HLP.  He was in USOH until about a year ago when he started having generalized fatigue.  Work up for his fatigue in the past was non revealing.  He then developed for the past 5 months progressive LUQ pain.  He had a visit with Dr. Valentina Lucks last week.  A CBC was obtained which showed severe leukocytosis.  He was kindly referred to the [facility] for evaluation.   Mr. Evan [name] presented to the Cancer Center for the fist time today with his wife and daughter.  He has generalized fatigue and low stamina. He has DOE with long ambulation.  He denied pedal edema, PND, orthopnea.   He is still independent of activities of daily living.  However, he does not do much chores around the house or leave the house much.  He has LUQ crampy, mild pain which does not require frequent pain meds.  LUQ pain is not related to eating or bowel movement.  The pain lasts a few minutes and spontaneously resolve.  He denied jaundice, abdominal swelling, early satiety, nausea/vomiting, hematemasis, hematochezia, melena.  He also has dry, nonproductive cough the last few weeks.  He denied fever, chest pain, hemoptysis.  He does have night sweat and non intentional weight loss (about 10-15 lb over the last few months) despite normal appetite.  He has chronic back pain for many years which has not worsened.  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   Patient denies fever, headache, visual changes, confusion, palpable lymph node swelling, mucositis, odynophagia, dysphagia, productive cough, gum bleeding, epistaxis, skin rash, spontaneous bleeding, joint swelling,  joint pain, heat or cold intolerance, depression.    Past Medical History  Diagnosis Date  . Chronic back pain greater than 3 months duration   . Bulging discs   . Diabetes mellitus     type II; neuropathy;   . Hypertension   . High cholesterol   . Leukocytosis   . GERD (gastroesophageal reflux disease)   . Pancreatitis 2004  . OSA (obstructive sleep apnea) 2010  . Anxiety   . DDD (degenerative disc disease) 2005    lumbar spine  :    Past Surgical History  Procedure Date  . Cholecystectomy   . Hernia repair   . Skin cancer removed 2012    right ear, basal cell carcinoma.   Marland Kitchen Spinal stretching   . Colonoscopy 2012    [facility]  :   CURRENT MEDS: Current Outpatient Prescriptions  Medication Sig Dispense Refill  . aspirin EC 81 MG tablet Take 81 mg by mouth daily.      . B Complex-C (SUPER B COMPLEX/VITAMIN C PO) Take by mouth daily.      . insulin glargine (LANTUS) 100 UNIT/ML injection Inject 40 Units into the skin at bedtime.       Marland Kitchen losartan (COZAAR) 100 MG tablet Take 100 mg by mouth daily.      . metFORMIN (GLUCOPHAGE) 1000 MG tablet Take 1,000 mg by mouth 2 (two) times daily with a meal.      . omeprazole (PRILOSEC) 20 MG capsule Take 20 mg by mouth daily.      Marland Kitchen  saxagliptin HCl (ONGLYZA) 5 MG TABS tablet Take 5 mg by mouth daily.      . simvastatin (ZOCOR) 20 MG tablet Take 20 mg by mouth every evening.          No Known Allergies:  Family History  Problem Relation Age of Onset  . Heart attack Mother   . Cancer Mother 65    lung  . Heart attack Father   . Stroke Father   :  History   Social History  . Marital Status: Married    Spouse Name: N/A    Number of Children: 2  . Years of Education: N/A   Occupational History  .      retired Human resources officer   Social History Main Topics  . Smoking status: Former Smoker -- 1.5 packs/day for 40 years    Quit date: 01/24/2004  . Smokeless tobacco: Never Used  . Alcohol Use: No  . Drug Use: No  .  Sexually Active:    Other Topics Concern  . Not on file   Social History Narrative  . No narrative on file  :  REVIEW OF SYSTEM:  The rest of the 14-point review of sytem was negative.   Exam: ECOG 1.   General:  well-nourished man,  in no acute distress.  Eyes:  no scleral icterus.  ENT:  There were no oropharyngeal lesions.  Neck was without thyromegaly.  Lymphatics:  Negative cervical, supraclavicular or axillary adenopathy.  Respiratory: lungs were clear bilaterally without wheezing or crackles.  Cardiovascular:  Regular rate and rhythm, S1/S2, without murmur, rub or gallop.  There was no pedal edema.  GI:  abdomen was soft, flat, nontender, nondistended.  There was splenomegaly about 5 cm below costal margin.  There was no fluid wave or hepatomegaly.   Muscoloskeletal:  no spinal tenderness of palpation of vertebral spine.  Skin exam was without echymosis, petichae.  Neuro exam was nonfocal.  Patient was able to get on and off exam table without assistance.  Gait was normal.  Patient was alerted and oriented.  Attention was good.   Language was appropriate.  Mood was normal without depression.  Speech was not pressured.  Thought content was not tangential.    LABS:  Lab Results  Component Value Date   WBC 141.2* 08/01/2012   HGB 13.0 08/01/2012   HCT 41.4 08/01/2012   PLT 380 08/01/2012   GLUCOSE 166* 09/10/2010   CHOL  Value: 93        ATP III CLASSIFICATION:  <200     mg/dL   Desirable  161-096  mg/dL   Borderline High  >=045    mg/dL   High        40/98/1191   TRIG 123 09/10/2010   HDL 32* 09/10/2010   LDLCALC  Value: 36        Total Cholesterol/HDL:CHD Risk Coronary Heart Disease Risk Table                     Men   Women  1/2 Average Risk   3.4   3.3  Average Risk       5.0   4.4  2 X Average Risk   9.6   7.1  3 X Average Risk  23.4   11.0        Use the calculated Patient Ratio above and the CHD Risk Table to determine the patient's CHD Risk.        ATP III  CLASSIFICATION (LDL):   <100     mg/dL   Optimal  161-096  mg/dL   Near or Above                    Optimal  130-159  mg/dL   Borderline  045-409  mg/dL   High  >811     mg/dL   Very High 91/47/8295   ALT 20 09/10/2010   AST 18 09/10/2010   NA 141 09/10/2010   K 4.7 09/10/2010   CL 110 09/10/2010   CREATININE 1.09 09/10/2010   BUN 9 09/10/2010   CO2 26 09/10/2010   INR 1.10 09/09/2010   HGBA1C  Value: 7.3 (NOTE)                                                                       According to the ADA Clinical Practice Recommendations for 2011, when HbA1c is used as a screening test:   >=6.5%   Diagnostic of Diabetes Mellitus           (if abnormal result  is confirmed)  5.7-6.4%   Increased risk of developing Diabetes Mellitus  References:Diagnosis and Classification of Diabetes Mellitus,Diabetes Care,2011,34(Suppl 1):S62-S69 and Standards of Medical Care in         Diabetes - 2011,Diabetes AOZH,0865,78  (Suppl 1):S11-S61.* 09/09/2010    Blood smear review:   I personally reviewed the patient's peripheral blood smear today.  There was anisocytosis.  There was rare peripheral blast.  There were increased in band and left shift in granulocytic cell line.  There was no schistocytosis, spherocytosis, target cell, rouleaux formation, tear drop cell.  There was no giant platelets or platelet clumps.      ASSESSMENT AND PLAN:  1.  Diabetes mellitus, type II:  He is on insulin, metformin, saxagliptin per PCP.   2.  Hypertension:  Well controlled on Losartan per PCP.  3.  Hyperlipidemia:  On simvastatin per PCP.   4.  History of smoking: none at this time.  5.  Cough, weight loss:  Given history of smoking, I sent for a PA/lat CXR today which I personally reviewed myself which showed a left lower lobe nodule.  Given high suspicion for bronchogenic carcinoma, I referred patient to CT chest with contrast.   6.  Splenomegaly:  Concerned myeloproliferative disease given leukocytosis.  Work up as below.   7.   Leukocytosis with neutrophilia and left shift:  - Differentials:  CML is high on differential.  Others:  CMML,  to be determined.  Need to also rule out AML but less likely given lack of anemia or thrombocytopenia.  -Work up:  Strongly recommend a bone marrow biopsy ASAP for diagnostic and prognostic purposes.  Bone marrow biopsy is scheduled for tomorrow at the Cancer Center at 9am.  Please show up at 8:50am.  I advised them of potential side effects of bone marrow biopsy which include but not limited to pain, bleeding, infection, perforation.  Mr. Izquierdo and family expressed informed understanding but wished to proceed with the bone marrow biopsy tomorrow.  - Treatment:  Pending result of bone marrow biopsy. - Follow up:  Friday 08/05/2012 but sooner if result comes out sooner.     Thank  you for this referral.    The length of time of the encounter was 60 minutes. More than 50% of time was spent counseling and coordination of care.

## 2012-08-02 ENCOUNTER — Other Ambulatory Visit: Payer: Medicare Other | Admitting: Lab

## 2012-08-02 ENCOUNTER — Ambulatory Visit (HOSPITAL_BASED_OUTPATIENT_CLINIC_OR_DEPARTMENT_OTHER): Payer: Medicare Other | Admitting: Oncology

## 2012-08-02 ENCOUNTER — Other Ambulatory Visit (HOSPITAL_COMMUNITY)
Admission: RE | Admit: 2012-08-02 | Discharge: 2012-08-02 | Disposition: A | Discharge status: Home / Self Care | Payer: Medicare Other | Source: Ambulatory Visit | Attending: Oncology | Admitting: Oncology

## 2012-08-02 ENCOUNTER — Telehealth: Payer: Self-pay | Admitting: Oncology

## 2012-08-02 VITALS — BP 115/68 | HR 79 | Temp 97.6°F

## 2012-08-02 DIAGNOSIS — D72829 Elevated white blood cell count, unspecified: Secondary | ICD-10-CM | POA: Insufficient documentation

## 2012-08-02 NOTE — Telephone Encounter (Signed)
gv pt wife ct appt for 9/19 and confirmed 9/20 f/u appt.

## 2012-08-02 NOTE — Progress Notes (Signed)
Patient received Bone Marrow Biopsy today; tolerated well; pre and post vital signs stable.

## 2012-08-02 NOTE — Procedures (Signed)
   [facility]  [phone] [phone]   BONE MARROW BIOPSY AND ASPIRATION   INDICATION:leukocytosis; suspecting myeloproliferative disease CML.   Procedure: After obtained from consent, Evan [name] was placed in the prone position. Time out was performed verifying correct patient and procedure. The skin overlying the let posterior crest was prepped with Betadine and draped in the usual sterile fashion. The skin and periosteum were infiltrated with 10 mL of 2% lidocaine x 2 as he was still uncomfortable after the first 10mL. A small puncture wound was made with #11 scalpel blade.  Bone marrow aspirate was obtained on the first pass of the aspiration needle.  Once core biopsy was obtained through the same incision.   The aspirate was sent for routine histology, flow cytometry, BCR/ABL FISH and classical cytogenetics.  Core biopsy was sent for routine histology.   Evan [name] tolerated procedure well with minimal  blood loss and without immediate complication.   A sterile dressing was applied.   Jethro Bolus M.D. 08/02/2012

## 2012-08-02 NOTE — Progress Notes (Signed)
Bone marrow biopsy.  Please see procedure note same date.

## 2012-08-02 NOTE — Patient Instructions (Addendum)
[facility] Discharge Instructions for Post Bone Marrow Procedure  Today you had a bone marrow biopsy and aspirate of lt. hip   Please keep the pressure dressing in place for at least 24 hours.  Have someone check your dressing periodically for bleeding.  If needed you can reapply a pressure dressing to the site.  Take pain medication tylenol and motrin as directed.  IF BLEEDING REOCCURS THAT SHOULD BE REPORTED IMMEDIATELY. Call the Cancer Center at [phone] if during business hours. Or report to the Emergency Room.   I have been informed and understand all the instructions given to me. I know to contact the clinic, my physician, or go to the Emergency Department if any problems should occur. I do not have any questions at this time, but understand that I may call the clinic during office hours at [phone]  should I have any questions or need assistance in obtaining follow up care.    __________________________________________  _____________  __________ Signature of Patient or Authorized Representative            Date                   Time    __________________________________________ Nurse's Signature

## 2012-08-03 ENCOUNTER — Ambulatory Visit: Payer: Medicare Other | Admitting: Oncology

## 2012-08-03 ENCOUNTER — Other Ambulatory Visit: Payer: Medicare Other | Admitting: Lab

## 2012-08-03 ENCOUNTER — Ambulatory Visit: Payer: Medicare Other

## 2012-08-04 ENCOUNTER — Ambulatory Visit (HOSPITAL_COMMUNITY)
Admission: RE | Admit: 2012-08-04 | Discharge: 2012-08-04 | Disposition: A | Discharge status: Home / Self Care | Payer: Medicare Other | Source: Ambulatory Visit | Attending: Oncology | Admitting: Oncology

## 2012-08-04 DIAGNOSIS — R911 Solitary pulmonary nodule: Secondary | ICD-10-CM | POA: Insufficient documentation

## 2012-08-04 DIAGNOSIS — J438 Other emphysema: Secondary | ICD-10-CM | POA: Insufficient documentation

## 2012-08-04 DIAGNOSIS — D72829 Elevated white blood cell count, unspecified: Secondary | ICD-10-CM

## 2012-08-04 DIAGNOSIS — I2510 Atherosclerotic heart disease of native coronary artery without angina pectoris: Secondary | ICD-10-CM | POA: Insufficient documentation

## 2012-08-04 DIAGNOSIS — Z9089 Acquired absence of other organs: Secondary | ICD-10-CM | POA: Insufficient documentation

## 2012-08-04 DIAGNOSIS — R161 Splenomegaly, not elsewhere classified: Secondary | ICD-10-CM | POA: Insufficient documentation

## 2012-08-04 DIAGNOSIS — R05 Cough: Secondary | ICD-10-CM

## 2012-08-04 DIAGNOSIS — R634 Abnormal weight loss: Secondary | ICD-10-CM | POA: Insufficient documentation

## 2012-08-04 DIAGNOSIS — I700 Atherosclerosis of aorta: Secondary | ICD-10-CM | POA: Insufficient documentation

## 2012-08-04 DIAGNOSIS — Z87891 Personal history of nicotine dependence: Secondary | ICD-10-CM | POA: Insufficient documentation

## 2012-08-04 MED ORDER — IOHEXOL 300 MG/ML  SOLN
80.0000 mL | Freq: Once | INTRAMUSCULAR | Status: AC | PRN
  Administered 2012-08-04: 80 mL via INTRAVENOUS

## 2012-08-04 NOTE — Progress Notes (Signed)
Visit was moved up to 08/01/12.

## 2012-08-05 ENCOUNTER — Ambulatory Visit (HOSPITAL_COMMUNITY)
Admission: RE | Admit: 2012-08-05 | Discharge: 2012-08-05 | Disposition: A | Discharge status: Home / Self Care | Payer: Medicare Other | Source: Ambulatory Visit | Attending: Oncology | Admitting: Oncology

## 2012-08-05 ENCOUNTER — Ambulatory Visit (HOSPITAL_BASED_OUTPATIENT_CLINIC_OR_DEPARTMENT_OTHER): Payer: Medicare Other | Admitting: Oncology

## 2012-08-05 ENCOUNTER — Telehealth: Payer: Self-pay | Admitting: *Deleted

## 2012-08-05 ENCOUNTER — Telehealth: Payer: Self-pay | Admitting: Oncology

## 2012-08-05 ENCOUNTER — Other Ambulatory Visit: Payer: Self-pay

## 2012-08-05 ENCOUNTER — Encounter: Payer: Self-pay | Admitting: Oncology

## 2012-08-05 VITALS — BP 131/69 | HR 92 | Temp 97.1°F | Resp 20 | Ht 72.0 in | Wt 238.7 lb

## 2012-08-05 DIAGNOSIS — C9210 Chronic myeloid leukemia, BCR/ABL-positive, not having achieved remission: Secondary | ICD-10-CM | POA: Insufficient documentation

## 2012-08-05 DIAGNOSIS — M79605 Pain in left leg: Secondary | ICD-10-CM

## 2012-08-05 DIAGNOSIS — M79609 Pain in unspecified limb: Secondary | ICD-10-CM | POA: Insufficient documentation

## 2012-08-05 DIAGNOSIS — R911 Solitary pulmonary nodule: Secondary | ICD-10-CM

## 2012-08-05 DIAGNOSIS — D72829 Elevated white blood cell count, unspecified: Secondary | ICD-10-CM | POA: Insufficient documentation

## 2012-08-05 HISTORY — DX: Pain in left leg: M79.605

## 2012-08-05 HISTORY — DX: Chronic myeloid leukemia, BCR/ABL-positive, not having achieved remission: C92.10

## 2012-08-05 MED ORDER — DASATINIB 100 MG PO TABS: 100.0000 mg | ORAL_TABLET | Freq: Every day | ORAL | Status: DC

## 2012-08-05 NOTE — Telephone Encounter (Signed)
Sent pt to Doppler of Left leg , gave pt appt for September and October 2013, no precert needed per Lilyan Punt

## 2012-08-05 NOTE — Progress Notes (Signed)
Rx for Sprycel given to Axel Filler in [facility] Dept for prior Auth.

## 2012-08-05 NOTE — Telephone Encounter (Signed)
VM from [facility] in Vascular Lab.  States preliminary results are negative for DVT or Baker's cyst.  They sent pt home.

## 2012-08-05 NOTE — Progress Notes (Signed)
[employer], [id number], sprycel 100mg  has been approved from 08/05/12-08/05/13.

## 2012-08-05 NOTE — Progress Notes (Signed)
[facility]  [phone] [phone]   OFFICE PROGRESS NOTE   Cc:  Lillia Mountain, MD  DIAGNOSIS:  Chronic phase, chronic myelogenous leukemia.  Presenting WBC of 140K; peripheral blood BCR/ABL1 by PCR showed 209.05% b2a2 type and b3a2 type.  Bone marrow biopsy on 08/02/2012 was consistent with CML and 3% blast.    CURRENT THERAPY:  Here to finalize treatment option today.   INTERVAL HISTORY: Evan [name] 71 y.o. male returns for discussion with his wife.  His daughter who was here last time didn't make it today to the visit. He reports mild fatigue. He spends a lot of time in sitting position. He still has some mild left upper quadrant abdominal discomfort; however, he does not require chronic pain medication. He reports left posterior knee pain is been going on for the past week. This pain keeps him up from sleep at night. He has been taking Tylenol without much relief. There is no swelling or palpable nodules on the posterior left calf and thigh.  Patient denies fever, anorexia, weight loss, headache, visual changes, confusion, drenching night sweats, palpable lymph node swelling, mucositis, odynophagia, dysphagia, nausea vomiting, jaundice, chest pain, palpitation, shortness of breath, dyspnea on exertion, productive cough, gum bleeding, epistaxis, hematemesis, hemoptysis, abdominal pain, abdominal swelling, early satiety, melena, hematochezia, hematuria, skin rash, spontaneous bleeding, joint swelling, heat or cold intolerance, bowel bladder incontinence, back pain, focal motor weakness, paresthesia, depression.    Past Medical History  Diagnosis Date  . Chronic back pain greater than 3 months duration   . Bulging discs   . Diabetes mellitus     type II; neuropathy;   . Hypertension   . High cholesterol   . Leukocytosis   . GERD (gastroesophageal reflux disease)   . Pancreatitis 2004  . OSA (obstructive sleep apnea) 2010  . Anxiety     . DDD (degenerative disc disease) 2005    lumbar spine  . History of smoking 08/01/2012  . Weight loss 08/01/2012  . Cough 08/01/2012  . 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 Left leg pain 08/05/2012    Past Surgical History  Procedure Date  . Cholecystectomy   . Hernia repair   . Skin cancer removed 2012    right ear, basal cell carcinoma.   Marland Kitchen Spinal stretching   . Colonoscopy 2012    [facility]    Current Outpatient Prescriptions  Medication Sig Dispense Refill  . aspirin EC 81 MG tablet Take 81 mg by mouth daily.      . B Complex-C (SUPER B COMPLEX/VITAMIN C PO) Take by mouth daily.      . insulin glargine (LANTUS) 100 UNIT/ML injection Inject 40 Units into the skin at bedtime.       Marland Kitchen losartan (COZAAR) 100 MG tablet Take 100 mg by mouth daily.      Marland Kitchen omeprazole (PRILOSEC) 20 MG capsule Take 20 mg by mouth daily.      . saxagliptin HCl (ONGLYZA) 5 MG TABS tablet Take 5 mg by mouth daily.      . simvastatin (ZOCOR) 20 MG tablet Take 20 mg by mouth every evening.      . dasatinib (SPRYCEL) 100 MG tablet Take 1 tablet (100 mg total) by mouth daily.  30 tablet  3  . metFORMIN (GLUCOPHAGE) 1000 MG tablet Take 1,000 mg by mouth 2 (two) times daily with a meal.        ALLERGIES:   has no known allergies.  REVIEW OF SYSTEMS:  The rest of the 14-point review of system was negative.   Filed Vitals:   08/05/12 1004  BP: 131/69  Pulse: 92  Temp: 97.1 F (36.2 C)  Resp: 20   Wt Readings from Last 3 Encounters:  08/05/12 238 lb 11.2 oz (108.274 kg)  08/01/12 240 lb 3.2 oz (108.954 kg)  01/24/12 255 lb (115.667 kg)   ECOG Performance status: 1-2  PHYSICAL EXAMINATION:   General: well-nourished man, in no acute distress. Eyes: no scleral icterus. ENT: There were no oropharyngeal lesions. Neck was without thyromegaly. Lymphatics: Negative cervical, supraclavicular or axillary adenopathy. Respiratory: lungs were clear bilaterally without wheezing or crackles. Cardiovascular:  Regular rate and rhythm, S1/S2, without murmur, rub or gallop. There was no pedal edema. GI: abdomen was soft, flat, nontender, nondistended. There was splenomegaly about 5 cm below costal margin. There was no fluid wave or hepatomegaly. Muscoloskeletal: no spinal tenderness of palpation of vertebral spine.  I could not palpate any left popliteal/posterior knee/calf/thigh nodule.   Skin exam was without echymosis, petichae. Neuro exam was nonfocal. Patient was able to get on and off exam table without assistance. Gait was normal. Patient was alerted and oriented. Attention was good. Language was appropriate. Mood was normal without depression. Speech was not pressured. Thought content was not tangential.        LABORATORY/RADIOLOGY DATA:  Lab Results  Component Value Date   WBC 141.2* 08/01/2012   HGB 13.0 08/01/2012   HCT 41.4 08/01/2012   PLT 380 08/01/2012   GLUCOSE 244* 08/01/2012   CHOL  Value: 93        ATP III CLASSIFICATION:  <200     mg/dL   Desirable  161-096  mg/dL   Borderline High  >=045    mg/dL   High        40/98/1191   TRIG 123 09/10/2010   HDL 32* 09/10/2010   LDLCALC  Value: 36        Total Cholesterol/HDL:CHD Risk Coronary Heart Disease Risk Table                     Men   Women  1/2 Average Risk   3.4   3.3  Average Risk       5.0   4.4  2 X Average Risk   9.6   7.1  3 X Average Risk  23.4   11.0        Use the calculated Patient Ratio above and the CHD Risk Table to determine the patient's CHD Risk.        ATP III CLASSIFICATION (LDL):  <100     mg/dL   Optimal  478-295  mg/dL   Near or Above                    Optimal  130-159  mg/dL   Borderline  621-308  mg/dL   High  >657     mg/dL   Very High 84/69/6295   ALKPHOS 120 08/01/2012   ALT 17 08/01/2012   AST 24 08/01/2012   NA 143 08/01/2012   K 4.4 08/01/2012   CL 105 08/01/2012   CREATININE 1.6* 08/01/2012   BUN 17.0 08/01/2012   CO2 26 08/01/2012   INR 1.11 08/01/2012   HGBA1C  Value: 7.3 (NOTE)  According to the ADA Clinical Practice Recommendations for 2011, when HbA1c is used as a screening test:   >=6.5%   Diagnostic of Diabetes Mellitus           (if abnormal result  is confirmed)  5.7-6.4%   Increased risk of developing Diabetes Mellitus  References:Diagnosis and Classification of Diabetes Mellitus,Diabetes Care,2011,34(Suppl 1):S62-S69 and Standards of Medical Care in         Diabetes - 2011,Diabetes [phone]  (Suppl 1):S11-S61.* 09/09/2010    IMAGING:  I personally reviewed the following CXR and CT scan and showed the patient and his wife the images.    Dg Chest 2 View  08/01/2012  *RADIOLOGY REPORT*  Clinical Data: Smoker.  Weight loss.  CHEST - 2 VIEW  Comparison: 09/09/2010  Findings: Heart size is normal.  No pleural effusion or edema identified.  There is no airspace consolidation identified.  Vague, ill defined opacity is noted in the left base and appears new from previous exam.  IMPRESSION:  1.  Vague ill-defined opacity in the left lung base.  Recommend further evaluation with noncontrast CT of the chest.   Original Report Authenticated By: Rosealee Albee, M.D.    Ct Chest W Contrast  08/04/2012  *RADIOLOGY REPORT*  Clinical Data: Weight loss.  History of smoking.  Possible mass noted on chest x-ray.  CT CHEST WITH CONTRAST  Technique:  Multidetector CT imaging of the chest was performed following the standard protocol during bolus administration of intravenous contrast.  Contrast: 80mL OMNIPAQUE IOHEXOL 300 MG/ML  SOLN  Comparison: Chest x-ray 08/01/2012.  Findings:  Mediastinum: Heart size is normal. There is no significant pericardial fluid, thickening or pericardial calcification. There is atherosclerosis of the thoracic aorta, the great vessels of the mediastinum and the coronary arteries, including calcified atherosclerotic plaque in the left main, left anterior descending, left circumflex and right coronary arteries. No  pathologically enlarged mediastinal or hilar lymph nodes. Esophagus is unremarkable in appearance.  Lungs/Pleura: 6 mm nodule in the posterior aspect of the left upper lobe associated with the left major fissure (image 23 of series 5). No other larger more suspicious appearing pulmonary nodules or masses are otherwise noted.  Calcified granuloma in the posterior aspect of the right lower lobe inferiorly.  Mild diffuse bronchial wall thickening with mild - moderate centrilobular emphysema, most pronounced in the lung apices.  No acute consolidative airspace disease.  No pleural effusions.  Focal area of subsegmental atelectasis or scarring in the inferior segment lingula.  Upper Abdomen: Splenomegaly (7.8 x 20.4 cm).  Status post cholecystectomy.  Musculoskeletal: There are no aggressive appearing lytic or blastic lesions noted in the visualized portions of the skeleton.  IMPRESSION: 1.  There is no suspicious appearing pulmonary nodule or mass to correspond to the perceived abnormality on the recent chest radiograph. 2.  Mild diffuse bronchial wall thickening with moderate centrilobular emphysema, most pronounced in the lung apices; findings compatible with underlying COPD. 3.  There is a small 6 mm subpleural nodule associated with the superior aspect of the left major fissure which is nonspecific. While this may simply represent a subpleural lymph node, given the smoking related changes in the lungs, the patient is at high risk for bronchogenic carcinoma, and follow-up chest CT in 6-12 months is recommended.   This recommendation follows the consensus statement: Guidelines for Management of Small Pulmonary Nodules Detected on CT Scans: A Statement from the Fleischner Society as published in Radiology 2005; 237:395-400. 4. Atherosclerosis, including left main  and three-vessel coronary artery disease.   Assessment for potential risk factor modification, dietary therapy or pharmacologic therapy may be warranted, if  clinically indicated. 5.  Splenomegaly.   Original Report Authenticated By: Florencia Reasons, M.D.     ASSESSMENT AND PLAN:   1. Diabetes mellitus, type II: He is on insulin, metformin, saxagliptin per PCP.  2. Hypertension: Well controlled on Losartan per PCP.  3. Hyperlipidemia: On simvastatin per PCP.  4. History of smoking: none at this time.  5. Subcm left lobe nodule:  Given his history of smoking and weight loss, I got a screening CXR which showed possible lung nodule.  I followed up with a chest CT which showed COPD and a subcm 5mm left lung nodule.  I will consider follow up with a chest CT around March 2014 to ensure stability.  6.  Left posterior knee/popliteal pain:  Due to suspicion for DVT (decreased mobility), I sent him for [place] with Doppler today which was negative for DVT or Baker's cyst.  I discussed the case with Dr. Valentina Lucks who is willing to help out with further work up if symptom persist.  7.  Chronic myelogenous leukemia (CML); chronic phase. CML was not staged like solid cancer; rather according to phases (chronic phase, accelerated phase, last phase.) Chronic phase is the best phase to be in.    *Treatment: Oral chemotherapy. There is no role for bone marrow transplant unless the CML is refractory to treatment with oral chemotherapy.   *  Oral chemotherapy options:  They all work to block the growth mechanism of CML by blocking BCR/ABL gene.     - Imatinib (brand name: Gleevec):  Old option.    - Dasatinib (brand name:  Sprycel):  Better than imatinib in inducing remission and lower chance of transforming to blastic phase (acute myeloid leukemia AML).  Potential side effects include but not limited to low blood count, bleeding, infection, mild fatigue, skin rash, fluid retention, pleural effusion (fluid outside the lungs).  I recommend this one.    - Nilotinib (brand name:  Tasigna):  Also better than imatinib number; however, it can cause pancreatitis.  Given the  patient's history of pancreatitis, this is not my first option.   * Assessment of response:  Follow up with blood test to see WBC normalizes (hematologic response); bone marrow biopsy with decreasing Philadelphia chromosome (translocation 9;22); and blood test or bone marrow FISH test with no more BCR/ABL.   * Follow up:  In 2, 6, and 9 weeks to ensure labs OK.  Follow up visit in 1 month with Belenda Cruise, NP and with me in 3 months with bone marrow biopsy the week before.    The length of time of the face-to-face encounter was 45 minutes. More than 50% of time was spent counseling and coordination of care.

## 2012-08-05 NOTE — Progress Notes (Signed)
VASCULAR LAB PRELIMINARY  PRELIMINARY  PRELIMINARY  PRELIMINARY  Left lower extremity venous duplex completed.    Preliminary report:  Left:  No evidence of DVT, superficial thrombosis, or Baker's cyst.  Kayah Hecker, RVT 08/05/2012, 12:02 PM

## 2012-08-05 NOTE — Patient Instructions (Addendum)
1. Diagnosis: Chronic myelogenous leukemia (CML); chronic phase. CML was not staged like solid cancer; rather according to phases (chronic phase, accelerated phase, last phase.) Chronic phase is the best phase to be in.  2. Treatment: Oral chemotherapy. There is no role for bone marrow transplant unless the CML is refractory to treatment with oral chemotherapy. 3. Oral chemotherapy options:  They all work to block the growth mechanism of CML by blocking BCR/ABL gene.    * Imatinib (brand name: Gleevec):  Old option.   * Dasatinib (brand name:  Sprycel):  Better than imatinib in inducing remission and lower chance of transforming to blastic phase (acute myeloid leukemia AML).  Potential side effects include but not limited to low blood count, bleeding, infection, mild fatigue, skin rash, fluid retention, pleural effusion (fluid outside the lungs).  I recommend this one.   * Nilotinib (brand name:  Tasigna):  Also better than imatinib number; however, it can cause pancreatitis.  Given the patient's history of pancreatitis, this is not my first option.  4. Assessment of response:  Follow up with blood test to see WBC normalizes (hematologic response); bone marrow biopsy with decreasing Philadelphia chromosome (translocation 9;22); and blood test or bone marrow FISH test with no more BCR/ABL.  5. Follow up:  In 2, 6, and 9 weeks to ensure labs OK.  Follow up visit in 1 month with Belenda Cruise, NP and with me in 3 months with bone marrow biopsy the week before.

## 2012-08-08 ENCOUNTER — Encounter: Payer: Self-pay | Admitting: Oncology

## 2012-08-08 NOTE — Progress Notes (Signed)
Faxed sprycel prescription to [facility].  Copay is $1900 they will assist him in applying for copay assistance.

## 2012-08-10 ENCOUNTER — Encounter: Payer: Self-pay | Admitting: Dietician

## 2012-08-10 ENCOUNTER — Telehealth: Payer: Self-pay | Admitting: *Deleted

## 2012-08-10 NOTE — Telephone Encounter (Signed)
Wife left VM they notified by [facility] pt's med, Sprycel, will arrive on Friday 9/27.  Should pt start taking med when he receives it?

## 2012-08-10 NOTE — Progress Notes (Signed)
Brief Out-patient Oncology Nutrition Note  Reason: Patient Screened Positive For Nutrition Risk For Unintentional Weight Loss and Decreased Appetite.   Evan [name] is a 71 year old male patient of Dr. Gaylyn Rong, diagnosed with leukocytosis. Contacted patient via telephone for positive nutrition risk. He reported his appetite is getting better. He reported he eats well at two meals daily. He reported he does not drink any nutrition supplements.   Wt Readings from Last 10 Encounters:  08/05/12 238 lb 11.2 oz (108.274 kg)  08/01/12 240 lb 3.2 oz (108.954 kg)  01/24/12 255 lb (115.667 kg)    I have educated the patient on high calorie, high protein diet. We discussed strategies to increase caloric intake. He reported he is consuming a high calorie diet. The patient was without any further nutrition related questions. Provided RD contact information and instructed patient to contact RD for future questions.   RD available for nutrition needs.   Iven Finn, MS, RD, LDN [phone]

## 2012-08-10 NOTE — Telephone Encounter (Signed)
Yes.  Please start Sprycel when it arrives.

## 2012-08-10 NOTE — Telephone Encounter (Signed)
Called wife back and instructed for pt to start Sprycel when it arrives.  Keep lab appt as scheduled on Friday 10/04 and call for any new questions/ concerns.  She verbalized understanding.  FYI; They found out pt's First Cousin's daughter also has same diagnosis of CML and want Dr. Gaylyn Rong to be aware.

## 2012-08-19 ENCOUNTER — Other Ambulatory Visit (HOSPITAL_BASED_OUTPATIENT_CLINIC_OR_DEPARTMENT_OTHER): Payer: Medicare Other

## 2012-08-19 ENCOUNTER — Telehealth: Payer: Self-pay | Admitting: *Deleted

## 2012-08-19 DIAGNOSIS — C9210 Chronic myeloid leukemia, BCR/ABL-positive, not having achieved remission: Secondary | ICD-10-CM

## 2012-08-19 DIAGNOSIS — M79605 Pain in left leg: Secondary | ICD-10-CM

## 2012-08-19 DIAGNOSIS — M79609 Pain in unspecified limb: Secondary | ICD-10-CM

## 2012-08-19 LAB — MANUAL DIFFERENTIAL
ANC (CHCC manual diff): 98.6 10*3/uL — ABNORMAL HIGH (ref 1.5–6.5)
Basophil: 10 % — ABNORMAL HIGH (ref 0–2)
Blasts: 1 % — ABNORMAL HIGH (ref 0–0)
EOS: 1 % (ref 0–7)
Metamyelocytes: 13 % — ABNORMAL HIGH (ref 0–0)
PLT EST: ADEQUATE
PROMYELO: 0 % (ref 0–0)

## 2012-08-19 LAB — COMPREHENSIVE METABOLIC PANEL (CC13)
ALT: 15 U/L (ref 0–55)
Albumin: 3.6 g/dL (ref 3.5–5.0)
CO2: 21 mEq/L — ABNORMAL LOW (ref 22–29)
Calcium: 10.1 mg/dL (ref 8.4–10.4)
Chloride: 105 mEq/L (ref 98–107)
Potassium: 4.1 mEq/L (ref 3.5–5.1)
Sodium: 140 mEq/L (ref 136–145)
Total Protein: 7 g/dL (ref 6.4–8.3)

## 2012-08-19 LAB — CBC WITH DIFFERENTIAL/PLATELET
HGB: 12.4 g/dL — ABNORMAL LOW (ref 13.0–17.1)
MCV: 94.1 fL (ref 79.3–98.0)
RDW: 16.7 % — ABNORMAL HIGH (ref 11.0–14.6)
WBC: 120.2 10*3/uL (ref 4.0–10.3)

## 2012-08-19 NOTE — Telephone Encounter (Signed)
Wife left a Vm yesterday at 4:11pm to report pt had headache and sore throat.  He wasn't sure if he should be seen by Dr. Gaylyn Rong or his PCP, Dr. Valentina Lucks? Called wife back w/ results of labs today.  Informed WBC improving per Dr. Gaylyn Rong.  Wife verbalized understanding.  She reports pt's sore throat is improved today.   Instructed for pt to contact PCP if sore throat recurs or any fevers/ coughing.  She verbalized understanding.

## 2012-09-05 ENCOUNTER — Other Ambulatory Visit (HOSPITAL_BASED_OUTPATIENT_CLINIC_OR_DEPARTMENT_OTHER): Payer: Medicare Other

## 2012-09-05 ENCOUNTER — Encounter: Payer: Self-pay | Admitting: Oncology

## 2012-09-05 ENCOUNTER — Ambulatory Visit (HOSPITAL_BASED_OUTPATIENT_CLINIC_OR_DEPARTMENT_OTHER): Payer: Medicare Other | Admitting: Oncology

## 2012-09-05 VITALS — BP 107/58 | HR 89 | Temp 97.1°F | Resp 20 | Ht 72.0 in | Wt 238.2 lb

## 2012-09-05 DIAGNOSIS — M79605 Pain in left leg: Secondary | ICD-10-CM

## 2012-09-05 DIAGNOSIS — R911 Solitary pulmonary nodule: Secondary | ICD-10-CM

## 2012-09-05 DIAGNOSIS — C9210 Chronic myeloid leukemia, BCR/ABL-positive, not having achieved remission: Secondary | ICD-10-CM

## 2012-09-05 LAB — CBC WITH DIFFERENTIAL/PLATELET
Basophils Absolute: 0.1 10*3/uL (ref 0.0–0.1)
Eosinophils Absolute: 0.1 10*3/uL (ref 0.0–0.5)
HGB: 11.5 g/dL — ABNORMAL LOW (ref 13.0–17.1)
MCV: 93.3 fL (ref 79.3–98.0)
MONO#: 0.7 10*3/uL (ref 0.1–0.9)
MONO%: 7.5 % (ref 0.0–14.0)
NEUT#: 6.1 10*3/uL (ref 1.5–6.5)
Platelets: 142 10*3/uL (ref 140–400)
RDW: 17.6 % — ABNORMAL HIGH (ref 11.0–14.6)
WBC: 9.5 10*3/uL (ref 4.0–10.3)

## 2012-09-05 LAB — COMPREHENSIVE METABOLIC PANEL (CC13)
Albumin: 3.7 g/dL (ref 3.5–5.0)
Alkaline Phosphatase: 68 U/L (ref 40–150)
BUN: 17 mg/dL (ref 7.0–26.0)
CO2: 21 mEq/L — ABNORMAL LOW (ref 22–29)
Calcium: 9.9 mg/dL (ref 8.4–10.4)
Chloride: 108 mEq/L — ABNORMAL HIGH (ref 98–107)
Glucose: 167 mg/dl — ABNORMAL HIGH (ref 70–99)
Potassium: 4.4 mEq/L (ref 3.5–5.1)
Sodium: 139 mEq/L (ref 136–145)
Total Protein: 6.6 g/dL (ref 6.4–8.3)

## 2012-09-05 MED ORDER — ONDANSETRON HCL 8 MG PO TABS: 8.0000 mg | ORAL_TABLET | Freq: Three times a day (TID) | ORAL | Status: DC | PRN

## 2012-09-05 NOTE — Progress Notes (Signed)
[facility]  Telephone:(336) [phone] Fax:(336) [phone]   OFFICE PROGRESS NOTE   Cc:  Lillia Mountain, MD  DIAGNOSIS:  Chronic phase, chronic myelogenous leukemia.  Presenting WBC of 140K; peripheral blood BCR/ABL1 by PCR showed 209.05% b2a2 type and b3a2 type.  Bone marrow biopsy on 08/02/2012 was consistent with CML and 3% blast.    CURRENT THERAPY:  Sprycel 100 mg daily started on 08/12/12.  INTERVAL HISTORY: Evan [name] 71 y.o. male returns for routine follow-up with is wife. He reports mild fatigue; but he was recently able to shovel mulch at his home. He is walking daily. He had pain in the left upper quadrant of his abdomen, but this has resolved since starting Sprycel. He had a sore throat without any other symptoms lasting 1 day, but resolved on its own. He vomited once, but has no further nausea. No chest pain or shortness of breath. Appetite and weight are stable.  Patient denies fever, anorexia, weight loss, headache, visual changes, confusion, drenching night sweats, palpable lymph node swelling, mucositis, odynophagia, dysphagia, nausea vomiting, jaundice, chest pain, palpitation, shortness of breath, dyspnea on exertion, productive cough, gum bleeding, epistaxis, hematemesis, hemoptysis, abdominal pain, abdominal swelling, early satiety, melena, hematochezia, hematuria, skin rash, spontaneous bleeding, joint swelling, heat or cold intolerance, bowel bladder incontinence, back pain, focal motor weakness, paresthesia, depression.    Past Medical History  Diagnosis Date  . Chronic back pain greater than 3 months duration   . Bulging discs   . Diabetes mellitus     type II; neuropathy;   . Hypertension   . High cholesterol   . Leukocytosis   . GERD (gastroesophageal reflux disease)   . Pancreatitis 2004  . OSA (obstructive sleep apnea) 2010  . Anxiety   . DDD (degenerative disc disease) 2005    lumbar spine  . History of smoking 08/01/2012  .  Weight loss 08/01/2012  . Cough 08/01/2012  . 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 Left leg pain 08/05/2012    Past Surgical History  Procedure Date  . Cholecystectomy   . Hernia repair   . Skin cancer removed 2012    right ear, basal cell carcinoma.   Marland Kitchen Spinal stretching   . Colonoscopy 2012    [facility]    Current Outpatient Prescriptions  Medication Sig Dispense Refill  . aspirin EC 81 MG tablet Take 81 mg by mouth daily.      . B Complex-C (SUPER B COMPLEX/VITAMIN C PO) Take by mouth daily.      . dasatinib (SPRYCEL) 100 MG tablet Take 1 tablet (100 mg total) by mouth daily.  30 tablet  3  . insulin glargine (LANTUS) 100 UNIT/ML injection Inject 40 Units into the skin at bedtime.       Marland Kitchen losartan (COZAAR) 100 MG tablet Take 100 mg by mouth daily.      . metFORMIN (GLUCOPHAGE) 1000 MG tablet Take 1,000 mg by mouth 2 (two) times daily with a meal.      . omeprazole (PRILOSEC) 20 MG capsule Take 20 mg by mouth daily.      . ondansetron (ZOFRAN) 8 MG tablet Take 1 tablet (8 mg total) by mouth every 8 (eight) hours as needed for nausea.  20 tablet  2  . saxagliptin HCl (ONGLYZA) 5 MG TABS tablet Take 5 mg by mouth daily.      . simvastatin (ZOCOR) 20 MG tablet Take 20 mg by mouth every evening.  ALLERGIES:   has no known allergies.  REVIEW OF SYSTEMS:  The rest of the 14-point review of system was negative.   Filed Vitals:   09/05/12 1033  BP: 107/58  Pulse: 89  Temp: 97.1 F (36.2 C)  Resp: 20   Wt Readings from Last 3 Encounters:  09/05/12 238 lb 3.2 oz (108.047 kg)  08/05/12 238 lb 11.2 oz (108.274 kg)  08/01/12 240 lb 3.2 oz (108.954 kg)   ECOG Performance status: 1-2  PHYSICAL EXAMINATION:   General: well-nourished man, in no acute distress. Eyes: no scleral icterus. ENT: There were no oropharyngeal lesions. Neck was without thyromegaly. Lymphatics: Negative cervical, supraclavicular or axillary adenopathy. Respiratory: lungs were clear bilaterally  without wheezing or crackles. Cardiovascular: Regular rate and rhythm, S1/S2, without murmur, rub or gallop. There was no pedal edema. GI: abdomen was soft, flat, nontender, nondistended. There was splenomegaly about 1 cm below costal margin. There was no fluid wave or hepatomegaly. Muscoloskeletal: no spinal tenderness of palpation of vertebral spine.  I could not palpate any left popliteal/posterior knee/calf/thigh nodule.   Skin exam was without echymosis, petichae. Neuro exam was nonfocal. Patient was able to get on and off exam table without assistance. Gait was normal. Patient was alerted and oriented. Attention was good. Language was appropriate. Mood was normal without depression. Speech was not pressured. Thought content was not tangential.    LABORATORY/RADIOLOGY DATA:  Lab Results  Component Value Date   WBC 9.5 09/05/2012   HGB 11.5* 09/05/2012   HCT 34.0* 09/05/2012   PLT 142 09/05/2012   GLUCOSE 167* 09/05/2012   CHOL  Value: 93        ATP III CLASSIFICATION:  <200     mg/dL   Desirable  098-119  mg/dL   Borderline High  >=147    mg/dL   High        82/95/6213   TRIG 123 09/10/2010   HDL 32* 09/10/2010   LDLCALC  Value: 36        Total Cholesterol/HDL:CHD Risk Coronary Heart Disease Risk Table                     Men   Women  1/2 Average Risk   3.4   3.3  Average Risk       5.0   4.4  2 X Average Risk   9.6   7.1  3 X Average Risk  23.4   11.0        Use the calculated Patient Ratio above and the CHD Risk Table to determine the patient's CHD Risk.        ATP III CLASSIFICATION (LDL):  <100     mg/dL   Optimal  086-578  mg/dL   Near or Above                    Optimal  130-159  mg/dL   Borderline  469-629  mg/dL   High  >528     mg/dL   Very High 41/32/4401   ALKPHOS 68 09/05/2012   ALT 17 09/05/2012   AST 20 09/05/2012   NA 139 09/05/2012   K 4.4 09/05/2012   CL 108* 09/05/2012   CREATININE 1.2 09/05/2012   BUN 17.0 09/05/2012   CO2 21* 09/05/2012   INR 1.11 08/01/2012   HGBA1C   Value: 7.3 (NOTE)  According to the ADA Clinical Practice Recommendations for 2011, when HbA1c is used as a screening test:   >=6.5%   Diagnostic of Diabetes Mellitus           (if abnormal result  is confirmed)  5.7-6.4%   Increased risk of developing Diabetes Mellitus  References:Diagnosis and Classification of Diabetes Mellitus,Diabetes Care,2011,34(Suppl 1):S62-S69 and Standards of Medical Care in         Diabetes - 2011,Diabetes Care,2011,34  (Suppl 1):S11-S61.* 09/09/2010   ASSESSMENT AND PLAN:   1. Diabetes mellitus, type II: He is on insulin, metformin, saxagliptin per PCP.  2. Hypertension: Well controlled on Losartan per PCP.  3. Hyperlipidemia: On simvastatin per PCP.  4. History of smoking: none at this time.  5. Subcm left lobe nodule:  Given his history of smoking and weight loss, I got a screening CXR which showed possible lung nodule.  I followed up with a chest CT which showed COPD and a subcm 5mm left lung nodule.  I will consider follow up with a chest CT around March 2014 to ensure stability.  6.  Left posterior knee/popliteal pain:  Resolved today. 7.  Chronic myelogenous leukemia (CML); chronic phase. On Sprycel and tolerating this well with a significant drop in his WBC. He has mild anemia, but no dose modification is needed at this time. Will assess response with CBC.He will have a bone marrow biopsy with decreasing Philadelphia chromosome (translocation 9;22); and blood test or bone marrow FISH test with no more BCR/ABL in December. 8. Anemia, mild and likely due to Sprycel. No active bleeding. No transfusion is indicated. 9. Nausea/vomiting prophylaxis. He had one episode of vomiting and it is unclear if this is related to the Sprycel. I have given him a prescription of Zofran to have on hand. 10. Follow-up: Labs every 2 weeks. Bone marrow biopsy in December with visit 1 week later.  The length of time of  the face-to-face encounter was 25 minutes. More than 50% of time was spent counseling and coordination of care.

## 2012-09-08 ENCOUNTER — Encounter: Payer: Self-pay | Admitting: *Deleted

## 2012-09-08 NOTE — Progress Notes (Unsigned)
Fax received from [facility],  They shipped pt's Sprycel on 09/07/12 for delivery today.

## 2012-09-16 ENCOUNTER — Other Ambulatory Visit (HOSPITAL_BASED_OUTPATIENT_CLINIC_OR_DEPARTMENT_OTHER): Payer: Medicare Other

## 2012-09-16 ENCOUNTER — Telehealth: Payer: Self-pay | Admitting: Oncology

## 2012-09-16 DIAGNOSIS — M79605 Pain in left leg: Secondary | ICD-10-CM

## 2012-09-16 DIAGNOSIS — M79609 Pain in unspecified limb: Secondary | ICD-10-CM

## 2012-09-16 DIAGNOSIS — C9210 Chronic myeloid leukemia, BCR/ABL-positive, not having achieved remission: Secondary | ICD-10-CM

## 2012-09-16 LAB — COMPREHENSIVE METABOLIC PANEL (CC13)
Albumin: 4 g/dL (ref 3.5–5.0)
Alkaline Phosphatase: 57 U/L (ref 40–150)
BUN: 18 mg/dL (ref 7.0–26.0)
Calcium: 10.3 mg/dL (ref 8.4–10.4)
Chloride: 108 mEq/L — ABNORMAL HIGH (ref 98–107)
Glucose: 147 mg/dl — ABNORMAL HIGH (ref 70–99)
Potassium: 4.1 mEq/L (ref 3.5–5.1)
Sodium: 139 mEq/L (ref 136–145)
Total Protein: 6.7 g/dL (ref 6.4–8.3)

## 2012-09-16 LAB — CBC WITH DIFFERENTIAL/PLATELET
Basophils Absolute: 0.2 10*3/uL — ABNORMAL HIGH (ref 0.0–0.1)
Eosinophils Absolute: 0.1 10*3/uL (ref 0.0–0.5)
HGB: 11.2 g/dL — ABNORMAL LOW (ref 13.0–17.1)
MONO#: 0.4 10*3/uL (ref 0.1–0.9)
MONO%: 7.2 % (ref 0.0–14.0)
NEUT#: 2.3 10*3/uL (ref 1.5–6.5)
RBC: 3.47 10*6/uL — ABNORMAL LOW (ref 4.20–5.82)
RDW: 18.4 % — ABNORMAL HIGH (ref 11.0–14.6)
WBC: 5.2 10*3/uL (ref 4.0–10.3)
lymph#: 2.3 10*3/uL (ref 0.9–3.3)

## 2012-09-16 NOTE — Telephone Encounter (Signed)
Labs reviewed with wife. Recommend that patient decrease Sprycel to 50 mg daily. Recheck labs as scheduled on 11/22.13.

## 2012-09-16 NOTE — Telephone Encounter (Signed)
Message copied by Myrtis Ser on Fri Sep 16, 2012 12:52 PM ------      Message from: Jethro Bolus T      Created: Fri Sep 16, 2012 11:17 AM       Arletta Bale not sure what this pt's supposed to be on Sprycel 100 or 50?  I thought that you talked with me last week and we did decrease somebody from 100 to 50.              Please call pt.  If he is taking 100 daily, then decrease it to 50 daily.  If he's taking 50 daily, then we should do it every other day.              Thanks.

## 2012-09-29 ENCOUNTER — Telehealth: Payer: Self-pay | Admitting: *Deleted

## 2012-09-29 NOTE — Telephone Encounter (Signed)
VM from wife wants to make sure it is ok for pt to have his Teeth cleaned next week at [facility].

## 2012-09-29 NOTE — Telephone Encounter (Signed)
Cleaning is OK.  I would not do any invasive procedure since his plt is slightly low on chemo pill.  Thanks.

## 2012-09-29 NOTE — Telephone Encounter (Signed)
Left VM for wife ok for cleaning but no invasive procedures due to slightly low platelets.  Instructed to call back if any questions.

## 2012-10-07 ENCOUNTER — Telehealth: Payer: Self-pay

## 2012-10-07 ENCOUNTER — Other Ambulatory Visit: Payer: Self-pay

## 2012-10-07 ENCOUNTER — Other Ambulatory Visit (HOSPITAL_BASED_OUTPATIENT_CLINIC_OR_DEPARTMENT_OTHER): Payer: Medicare Other | Admitting: Lab

## 2012-10-07 DIAGNOSIS — M79609 Pain in unspecified limb: Secondary | ICD-10-CM

## 2012-10-07 DIAGNOSIS — C9210 Chronic myeloid leukemia, BCR/ABL-positive, not having achieved remission: Secondary | ICD-10-CM

## 2012-10-07 DIAGNOSIS — M79605 Pain in left leg: Secondary | ICD-10-CM

## 2012-10-07 LAB — COMPREHENSIVE METABOLIC PANEL (CC13)
ALT: 16 U/L (ref 0–55)
AST: 17 U/L (ref 5–34)
Albumin: 3.8 g/dL (ref 3.5–5.0)
Alkaline Phosphatase: 57 U/L (ref 40–150)
BUN: 17 mg/dL (ref 7.0–26.0)
Chloride: 106 mEq/L (ref 98–107)
Creatinine: 1.2 mg/dL (ref 0.7–1.3)
Potassium: 4.3 mEq/L (ref 3.5–5.1)

## 2012-10-07 LAB — CBC WITH DIFFERENTIAL/PLATELET
BASO%: 1.2 % (ref 0.0–2.0)
Basophils Absolute: 0.1 10*3/uL (ref 0.0–0.1)
EOS%: 1.7 % (ref 0.0–7.0)
HCT: 36 % — ABNORMAL LOW (ref 38.4–49.9)
HGB: 12.5 g/dL — ABNORMAL LOW (ref 13.0–17.1)
LYMPH%: 37 % (ref 14.0–49.0)
MCH: 32.9 pg (ref 27.2–33.4)
MCHC: 34.6 g/dL (ref 32.0–36.0)
MCV: 94.9 fL (ref 79.3–98.0)
MONO%: 8.7 % (ref 0.0–14.0)
NEUT%: 51.4 % (ref 39.0–75.0)

## 2012-10-07 MED ORDER — DASATINIB 50 MG PO TABS: 50.0000 mg | ORAL_TABLET | Freq: Every day | ORAL | Status: DC

## 2012-10-07 NOTE — Telephone Encounter (Signed)
Message copied by Kallie Locks on Fri Oct 07, 2012 12:59 PM ------      Message from: Jethro Bolus T      Created: Fri Oct 07, 2012  9:54 AM       Please call pt.  His CML is stable on dasatinib (aka Sprycel) at 100mg  PO daily.  No change.  Thanks.

## 2012-10-10 ENCOUNTER — Encounter: Payer: Self-pay | Admitting: *Deleted

## 2012-10-10 NOTE — Progress Notes (Signed)
RECEIVED A FAX FROM [facility] CONCERNING A CONFIRMATION OF FACSIMILE RECEIPT FOR PT. REFERRAL. 

## 2012-10-11 NOTE — Telephone Encounter (Signed)
RECEIVED A FAX FROM [facility] CONCERNING A CONFIRMATION OF PRESCRIPTION SHIPMENT FOR SPRYCEL ON 10/10/12.

## 2012-10-28 ENCOUNTER — Other Ambulatory Visit: Payer: Self-pay | Admitting: *Deleted

## 2012-10-28 NOTE — Telephone Encounter (Signed)
THIS REFILL REQUEST FOR SPRYCEL WAS GIVEN TO DR.HA'S NURSE, CAMEO PORTER,RN. 

## 2012-10-31 ENCOUNTER — Other Ambulatory Visit: Payer: Self-pay | Admitting: Oncology

## 2012-10-31 NOTE — Telephone Encounter (Signed)
RECEIVED A FAX FROM [facility] CONCERNING A CONFIRMATION OF FACSIMILE RECEIPT FOR PT. REFERRAL. 

## 2012-11-01 ENCOUNTER — Other Ambulatory Visit: Payer: Self-pay | Admitting: Oncology

## 2012-11-02 ENCOUNTER — Ambulatory Visit (HOSPITAL_BASED_OUTPATIENT_CLINIC_OR_DEPARTMENT_OTHER): Payer: Medicare Other | Admitting: Oncology

## 2012-11-02 ENCOUNTER — Other Ambulatory Visit (HOSPITAL_COMMUNITY)
Admission: RE | Admit: 2012-11-02 | Discharge: 2012-11-02 | Disposition: A | Discharge status: Home / Self Care | Payer: Medicare Other | Source: Ambulatory Visit | Attending: Oncology | Admitting: Oncology

## 2012-11-02 ENCOUNTER — Other Ambulatory Visit (HOSPITAL_BASED_OUTPATIENT_CLINIC_OR_DEPARTMENT_OTHER): Payer: Medicare Other | Admitting: Lab

## 2012-11-02 VITALS — BP 118/68 | HR 76 | Temp 97.9°F | Resp 18

## 2012-11-02 DIAGNOSIS — C9210 Chronic myeloid leukemia, BCR/ABL-positive, not having achieved remission: Secondary | ICD-10-CM

## 2012-11-02 DIAGNOSIS — M79605 Pain in left leg: Secondary | ICD-10-CM

## 2012-11-02 LAB — CBC WITH DIFFERENTIAL/PLATELET
BASO%: 0.9 % (ref 0.0–2.0)
Basophils Absolute: 0.1 10*3/uL (ref 0.0–0.1)
EOS%: 2.6 % (ref 0.0–7.0)
MCH: 32 pg (ref 27.2–33.4)
MCHC: 33.9 g/dL (ref 32.0–36.0)
MCV: 94.3 fL (ref 79.3–98.0)
MONO%: 9.9 % (ref 0.0–14.0)
RBC: 4.34 10*6/uL (ref 4.20–5.82)
RDW: 14.5 % (ref 11.0–14.6)

## 2012-11-02 LAB — COMPREHENSIVE METABOLIC PANEL (CC13)
ALT: 20 U/L (ref 0–55)
AST: 22 U/L (ref 5–34)
Albumin: 3.9 g/dL (ref 3.5–5.0)
Alkaline Phosphatase: 56 U/L (ref 40–150)
BUN: 16 mg/dL (ref 7.0–26.0)
Potassium: 4.5 mEq/L (ref 3.5–5.1)
Sodium: 147 mEq/L — ABNORMAL HIGH (ref 136–145)

## 2012-11-02 NOTE — Progress Notes (Signed)
Please see bone marrow biopsy procedure note dated 11/02/2012.

## 2012-11-02 NOTE — Patient Instructions (Addendum)
Cancer Center Discharge Instructions for Post Bone Marrow Procedure  Today you had a bone marrow biopsy and aspirate of right hip.  Please keep the pressure dressing in place for at least 24 hours.  Have someone check your dressing periodically for bleeding.  If needed you can reapply a pressure dressing to the site.  Take pain medication as directed.  IF BLEEDING REOCCURS THAT SHOULD BE REPORTED IMMEDIATELY. Call the Cancer Center at [phone] if during business hours. Or report to the Emergency Room.   I have been informed and understand all the instructions given to me. I know to contact the clinic, my physician, or go to the Emergency Department if any problems should occur. I do not have any questions at this time, but understand that I may call the clinic during office hours at [phone]  should I have any questions or need assistance in obtaining follow up care.    __________________________________________  _____________  __________ Signature of Patient or Authorized Representative            Date                   Time    __________________________________________ Nurse's Signature     

## 2012-11-02 NOTE — Procedures (Signed)
   [facility]  Telephone:(336) [phone] Fax:(336) [phone]   BONE MARROW BIOPSY AND ASPIRATION   INDICATION:  Restaging for CML.   Procedure: After obtained from consent, Evan [name] was placed in the prone position. Time out was performed verifying correct patient and procedure. The skin overlying the left posterior crest was prepped with Betadine and draped in the usual sterile fashion. The skin and periosteum were infiltrated with 30 mL of 2% lidocaine as he was still uncomfortable after the first . A small puncture wound was made with #11 scalpel blade.  Bone marrow aspirate was obtained on the first pass of the aspiration needle.  One core biopsy was obtained through the same incision.   The aspirate was sent for routine histology, flow cytometry, FISH for BCR/ABL and cytogenetics.  Core biopsy was sent for routine histology.   Evan [name] tolerated procedure well with minimal  blood loss and without immediate complication.   A sterile dressing was applied.   Jethro Bolus M.D. 11/02/2012

## 2012-11-10 ENCOUNTER — Ambulatory Visit (HOSPITAL_BASED_OUTPATIENT_CLINIC_OR_DEPARTMENT_OTHER): Payer: Medicare Other | Admitting: Oncology

## 2012-11-10 ENCOUNTER — Telehealth: Payer: Self-pay | Admitting: Oncology

## 2012-11-10 VITALS — BP 139/74 | HR 66 | Temp 97.2°F | Resp 18 | Ht 74.0 in | Wt 242.6 lb

## 2012-11-10 DIAGNOSIS — R911 Solitary pulmonary nodule: Secondary | ICD-10-CM

## 2012-11-10 DIAGNOSIS — C9210 Chronic myeloid leukemia, BCR/ABL-positive, not having achieved remission: Secondary | ICD-10-CM

## 2012-11-10 NOTE — Patient Instructions (Addendum)
1.  Dx:  CML. 2.  Treatment:  Dasatinib (or Sprycel) 50mg  by mouth once daily. 3.  Status:  Sign of response with normalization of blood count. 4.  Continue treatment indefinitely.  Follow up in about 3 months.  Lab-only appointment every month.

## 2012-11-10 NOTE — Telephone Encounter (Signed)
gv and printed pt appt schedule for Jan thru April 2014...Marland KitchenMarland Kitchen

## 2012-11-10 NOTE — Progress Notes (Signed)
[facility]  Telephone:(336) [phone] Fax:(336) [phone]   OFFICE PROGRESS NOTE   Cc:  Lillia Mountain, MD  DIAGNOSIS:  Chronic phase, chronic myelogenous leukemia.  Presenting WBC of 140K; peripheral blood BCR/ABL1 by PCR showed 209.05% b2a2 type and b3a2 type.  Bone marrow biopsy on 08/02/2012 was consistent with CML and 3% blast.  Follow up bone marrow biopsy on 11/02/2012 (33-month mark) showed normal bone marrow morphologically.  Peripheral blood BCR/ABL showed 9.3% on 11/02/12 (25-month mark); bone marrow biopsy cytogenetic pending from 11/02/12.   CURRENT THERAPY:  Started on Dasatinib in 07/2012.   INTERVAL HISTORY: Evan [name] 71 y.o. male returns for discussion with his wife.  His daughter who was here last time didn't make it today to the visit. He reports mild fatigue. He spends a lot of time in sitting position. He still has some mild left upper quadrant abdominal discomfort; however, he does not require chronic pain medication. He reports left posterior knee pain is been going on for the past week. This pain keeps him up from sleep at night. He has been taking Tylenol without much relief. There is no swelling or palpable nodules on the posterior left calf and thigh.  Patient denies fever, anorexia, weight loss, headache, visual changes, confusion, drenching night sweats, palpable lymph node swelling, mucositis, odynophagia, dysphagia, nausea vomiting, jaundice, chest pain, palpitation, shortness of breath, dyspnea on exertion, productive cough, gum bleeding, epistaxis, hematemesis, hemoptysis, abdominal pain, abdominal swelling, early satiety, melena, hematochezia, hematuria, skin rash, spontaneous bleeding, joint swelling, heat or cold intolerance, bowel bladder incontinence, back pain, focal motor weakness, paresthesia, depression.    Past Medical History  Diagnosis Date  . Chronic back pain greater than 3 months duration   . Bulging discs   . Diabetes  mellitus     type II; neuropathy;   . Hypertension   . High cholesterol   . Leukocytosis   . GERD (gastroesophageal reflux disease)   . Pancreatitis 2004  . OSA (obstructive sleep apnea) 2010  . Anxiety   . DDD (degenerative disc disease) 2005    lumbar spine  . History of smoking 08/01/2012  . Weight loss 08/01/2012  . Cough 08/01/2012  . 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 Left leg pain 08/05/2012    Past Surgical History  Procedure Date  . Cholecystectomy   . Hernia repair   . Skin cancer removed 2012    right ear, basal cell carcinoma.   Marland Kitchen Spinal stretching   . Colonoscopy 2012    [facility]    Current Outpatient Prescriptions  Medication Sig Dispense Refill  . aspirin EC 81 MG tablet Take 81 mg by mouth daily.      . B Complex-C (SUPER B COMPLEX/VITAMIN C PO) Take by mouth daily.      . dasatinib (SPRYCEL) 50 MG tablet Take 1 tablet (50 mg total) by mouth daily.  30 tablet  0  . fluocinonide cream (LIDEX) 0.05 % As needed      . insulin glargine (LANTUS) 100 UNIT/ML injection Inject 35 Units into the skin at bedtime.       Marland Kitchen losartan (COZAAR) 100 MG tablet Take 100 mg by mouth daily.      . metFORMIN (GLUCOPHAGE) 1000 MG tablet Take 1,000 mg by mouth 2 (two) times daily with a meal.      . omeprazole (PRILOSEC) 20 MG capsule Take 20 mg by mouth. As needed      . ondansetron (ZOFRAN) 8  MG tablet Take 1 tablet (8 mg total) by mouth every 8 (eight) hours as needed for nausea.  20 tablet  2  . saxagliptin HCl (ONGLYZA) 5 MG TABS tablet Take 5 mg by mouth daily.      . simvastatin (ZOCOR) 20 MG tablet Take 20 mg by mouth every evening.        ALLERGIES:   has no known allergies.  REVIEW OF SYSTEMS:  The rest of the 14-point review of system was negative.   Filed Vitals:   11/10/12 1022  BP: 139/74  Pulse: 66  Temp: 97.2 F (36.2 C)  Resp: 18   Wt Readings from Last 3 Encounters:  11/10/12 242 lb 9.6 oz (110.043 kg)  09/05/12 238 lb 3.2 oz (108.047 kg)    08/05/12 238 lb 11.2 oz (108.274 kg)   ECOG Performance status: 1-2  PHYSICAL EXAMINATION:   General: well-nourished man, in no acute distress. Eyes: no scleral icterus. ENT: There were no oropharyngeal lesions. Neck was without thyromegaly. Lymphatics: Negative cervical, supraclavicular or axillary adenopathy. Respiratory: lungs were clear bilaterally without wheezing or crackles. Cardiovascular: Regular rate and rhythm, S1/S2, without murmur, rub or gallop. There was no pedal edema. GI: abdomen was soft, flat, nontender, nondistended. There was splenomegaly about 5 cm below costal margin. There was no fluid wave or hepatomegaly. Muscoloskeletal: no spinal tenderness of palpation of vertebral spine.  I could not palpate any left popliteal/posterior knee/calf/thigh nodule.   Skin exam was without echymosis, petichae. Neuro exam was nonfocal. Patient was able to get on and off exam table without assistance. Gait was normal. Patient was alerted and oriented. Attention was good. Language was appropriate. Mood was normal without depression. Speech was not pressured. Thought content was not tangential.        LABORATORY/RADIOLOGY DATA:  Lab Results  Component Value Date   WBC 6.8 11/02/2012   HGB 13.9 11/02/2012   HCT 40.9 11/02/2012   PLT 144 11/02/2012   GLUCOSE 122* 11/02/2012   CHOL  Value: 93        ATP III CLASSIFICATION:  <200     mg/dL   Desirable  696-295  mg/dL   Borderline High  >=284    mg/dL   High        13/24/4010   TRIG 123 09/10/2010   HDL 32* 09/10/2010   LDLCALC  Value: 36        Total Cholesterol/HDL:CHD Risk Coronary Heart Disease Risk Table                     Men   Women  1/2 Average Risk   3.4   3.3  Average Risk       5.0   4.4  2 X Average Risk   9.6   7.1  3 X Average Risk  23.4   11.0        Use the calculated Patient Ratio above and the CHD Risk Table to determine the patient's CHD Risk.        ATP III CLASSIFICATION (LDL):  <100     mg/dL   Optimal  272-536  mg/dL    Near or Above                    Optimal  130-159  mg/dL   Borderline  644-034  mg/dL   High  >742     mg/dL   Very High 59/56/3875   ALKPHOS 56 11/02/2012   ALT  20 11/02/2012   AST 22 11/02/2012   NA 147* 11/02/2012   K 4.5 11/02/2012   CL 107 11/02/2012   CREATININE 1.3 11/02/2012   BUN 16.0 11/02/2012   CO2 27 11/02/2012   INR 1.11 08/01/2012   HGBA1C  Value: 7.3 (NOTE)                                                                       According to the ADA Clinical Practice Recommendations for 2011, when HbA1c is used as a screening test:   >=6.5%   Diagnostic of Diabetes Mellitus           (if abnormal result  is confirmed)  5.7-6.4%   Increased risk of developing Diabetes Mellitus  References:Diagnosis and Classification of Diabetes Mellitus,Diabetes Care,2011,34(Suppl 1):S62-S69 and Standards of Medical Care in         Diabetes - 2011,Diabetes Care,2011,34  (Suppl 1):S11-S61.* 09/09/2010    EKG:  Normal rate, normal rhythm, normal intervals; no ST-T wave changes.  QTc interval was 436 ms.    ASSESSMENT AND PLAN:   1. Diabetes mellitus, type II: He is on insulin, metformin, saxagliptin per PCP.  2. Hypertension: Well controlled on Losartan per PCP.  3. Hyperlipidemia: On simvastatin per PCP.  4. History of smoking: none at this time.  5. Subcm left lobe nodule:  Given his history of smoking and weight loss, I got a screening CXR which showed possible lung nodule.  I followed up with a chest CT which showed COPD and a subcm 5mm left lung nodule.  I will consider follow up with a chest CT around March 2014 to ensure stability.  This should be ordered next time he is here.  7.  Chronic myelogenous leukemia (CML); chronic phase.   *  Treatment:  Was on dasatinib 100mg  PO daily; but was having cytopenia.  Dose now has been stable at 50mg  PO daily with normal CBC. His EKG showed normal QT interval.  He does not have ssx of pleural effusion.  There is no other side effects.   *  He has  achieved complete hematologic response, very good partial molecular response (cytogeneic response assessment pending) at 3 months.  *  I recommended continuation of dasatinib at 50mg  PO daily.  I will follow up with monthly CBC and adjust the dose as needed.  Return visit in about 3 months.   The length of time of the face-to-face encounter was 25 minutes. More than 50% of time was spent counseling and coordination of care.

## 2012-11-21 ENCOUNTER — Encounter: Payer: Self-pay | Admitting: Oncology

## 2012-12-01 ENCOUNTER — Encounter: Payer: Self-pay | Admitting: *Deleted

## 2012-12-01 NOTE — Progress Notes (Signed)
Rec'd fax that pt's Sprycel rx has been transferred from [facility] to [facility], [phone] and fax#[phone].

## 2012-12-02 ENCOUNTER — Encounter: Payer: Self-pay | Admitting: *Deleted

## 2012-12-02 ENCOUNTER — Encounter: Payer: Self-pay | Admitting: Oncology

## 2012-12-02 NOTE — Progress Notes (Signed)
Fax from [facility] pharmacy they have shipped pt's Sprycel for delivery on 12/05/12.

## 2012-12-02 NOTE — Progress Notes (Signed)
Fax received from [facility] pharmacy states pt approved of copay assistance of Spyrcel through [employer].  Pt will have no copay until funds are depleted.  Letter does not say how much funds or when they might be depleted.

## 2012-12-08 ENCOUNTER — Other Ambulatory Visit (HOSPITAL_BASED_OUTPATIENT_CLINIC_OR_DEPARTMENT_OTHER): Payer: Medicare Other

## 2012-12-08 DIAGNOSIS — C9210 Chronic myeloid leukemia, BCR/ABL-positive, not having achieved remission: Secondary | ICD-10-CM

## 2012-12-08 LAB — CBC WITH DIFFERENTIAL/PLATELET
Eosinophils Absolute: 0.7 10*3/uL — ABNORMAL HIGH (ref 0.0–0.5)
HCT: 40.7 % (ref 38.4–49.9)
LYMPH%: 39.5 % (ref 14.0–49.0)
MCV: 93.1 fL (ref 79.3–98.0)
MONO#: 0.8 10*3/uL (ref 0.1–0.9)
MONO%: 8.5 % (ref 0.0–14.0)
NEUT#: 3.9 10*3/uL (ref 1.5–6.5)
NEUT%: 43.3 % (ref 39.0–75.0)
Platelets: 144 10*3/uL (ref 140–400)
RBC: 4.37 10*6/uL (ref 4.20–5.82)

## 2012-12-12 ENCOUNTER — Telehealth: Payer: Self-pay | Admitting: *Deleted

## 2012-12-12 NOTE — Telephone Encounter (Signed)
Message copied by Wende Mott on Mon Dec 12, 2012  5:37 PM ------      Message from: HA, Raliegh Ip T      Created: Thu Dec 08, 2012 11:31 AM       Please call pt and advise him to continue dasatinib (aka Sprycel) at current dose 50mg  PO daily.  No change.  Thanks.

## 2012-12-12 NOTE — Telephone Encounter (Signed)
Called and spoke w/ wife.  Informed of labs stable and to continue Sprycel 50 mg daily,  No change.  Keep lab appt on 2/20 as scheduled. She verbalized understanding.

## 2012-12-29 ENCOUNTER — Encounter: Payer: Self-pay | Admitting: *Deleted

## 2012-12-29 NOTE — Progress Notes (Signed)
Fax from [facility] (ph#[phone], fax# [phone]),  They shipped Sprycel to pt for delivery on 12/30/12.  No further refills left on this Rx.

## 2012-12-31 ENCOUNTER — Other Ambulatory Visit: Payer: Self-pay

## 2013-01-05 ENCOUNTER — Telehealth: Payer: Self-pay | Admitting: *Deleted

## 2013-01-05 ENCOUNTER — Other Ambulatory Visit: Payer: Medicare Other

## 2013-01-05 DIAGNOSIS — C9210 Chronic myeloid leukemia, BCR/ABL-positive, not having achieved remission: Secondary | ICD-10-CM

## 2013-01-05 LAB — CBC WITH DIFFERENTIAL/PLATELET
BASO%: 0.7 % (ref 0.0–2.0)
EOS%: 5.4 % (ref 0.0–7.0)
HCT: 39 % (ref 38.4–49.9)
LYMPH%: 38 % (ref 14.0–49.0)
MCH: 32.9 pg (ref 27.2–33.4)
MCHC: 35.2 g/dL (ref 32.0–36.0)
MCV: 93.6 fL (ref 79.3–98.0)
MONO#: 0.6 10*3/uL (ref 0.1–0.9)
MONO%: 9.2 % (ref 0.0–14.0)
NEUT%: 46.7 % (ref 39.0–75.0)
Platelets: 144 10*3/uL (ref 140–400)
RBC: 4.16 10*6/uL — ABNORMAL LOW (ref 4.20–5.82)
WBC: 6.7 10*3/uL (ref 4.0–10.3)

## 2013-01-05 NOTE — Telephone Encounter (Signed)
Message copied by Wende Mott on Thu Jan 05, 2013  4:09 PM ------      Message from: HA, Raliegh Ip T      Created: Thu Jan 05, 2013  1:48 PM       Please call pt and advise him to continue Sprycel at 50mg  PO daily (no change in dose).  Thanks. ------

## 2013-01-05 NOTE — Telephone Encounter (Signed)
Notified pt labs wnl,  Continue Sprycel 50 mg daily and keep next appt in March as scheduled.  He verbalized understanding.

## 2013-01-23 ENCOUNTER — Other Ambulatory Visit: Payer: Self-pay | Admitting: Oncology

## 2013-01-23 MED ORDER — DASATINIB 50 MG PO TABS: 50.0000 mg | ORAL_TABLET | Freq: Every day | ORAL | Status: DC

## 2013-02-02 ENCOUNTER — Telehealth: Payer: Self-pay | Admitting: Oncology

## 2013-02-02 ENCOUNTER — Other Ambulatory Visit (HOSPITAL_BASED_OUTPATIENT_CLINIC_OR_DEPARTMENT_OTHER): Payer: Medicare Other | Admitting: Lab

## 2013-02-02 ENCOUNTER — Ambulatory Visit (HOSPITAL_BASED_OUTPATIENT_CLINIC_OR_DEPARTMENT_OTHER): Payer: Medicare Other | Admitting: Oncology

## 2013-02-02 ENCOUNTER — Encounter: Payer: Self-pay | Admitting: Oncology

## 2013-02-02 VITALS — BP 141/71 | HR 71 | Temp 97.8°F | Resp 20 | Ht 74.0 in | Wt 246.5 lb

## 2013-02-02 DIAGNOSIS — C9210 Chronic myeloid leukemia, BCR/ABL-positive, not having achieved remission: Secondary | ICD-10-CM

## 2013-02-02 LAB — COMPREHENSIVE METABOLIC PANEL (CC13)
ALT: 21 U/L (ref 0–55)
AST: 20 U/L (ref 5–34)
CO2: 27 mEq/L (ref 22–29)
Calcium: 9.9 mg/dL (ref 8.4–10.4)
Chloride: 106 mEq/L (ref 98–107)
Creatinine: 1.4 mg/dL — ABNORMAL HIGH (ref 0.7–1.3)
Sodium: 141 mEq/L (ref 136–145)
Total Bilirubin: 0.72 mg/dL (ref 0.20–1.20)
Total Protein: 6.7 g/dL (ref 6.4–8.3)

## 2013-02-02 LAB — CBC WITH DIFFERENTIAL/PLATELET
BASO%: 0.8 % (ref 0.0–2.0)
EOS%: 4.6 % (ref 0.0–7.0)
HCT: 38.5 % (ref 38.4–49.9)
MCH: 33.5 pg — ABNORMAL HIGH (ref 27.2–33.4)
MCHC: 34.9 g/dL (ref 32.0–36.0)
MONO#: 0.8 10*3/uL (ref 0.1–0.9)
NEUT%: 43.4 % (ref 39.0–75.0)
RBC: 4.01 10*6/uL — ABNORMAL LOW (ref 4.20–5.82)
WBC: 7.2 10*3/uL (ref 4.0–10.3)
lymph#: 2.9 10*3/uL (ref 0.9–3.3)

## 2013-02-02 NOTE — Progress Notes (Signed)
[facility]  Telephone:(336) [phone] [phone]) [phone]   OFFICE PROGRESS NOTE   Cc:  Lillia Mountain, MD  DIAGNOSIS:  Chronic phase, chronic myelogenous leukemia.  Presenting WBC of 140K; peripheral blood BCR/ABL1 by PCR showed 209.05% b2a2 type and b3a2 type.  Bone marrow biopsy on 08/02/2012 was consistent with CML and 3% blast.  Follow up bone marrow biopsy on 11/02/2012 (11-month mark) showed normal bone marrow morphologically.  Peripheral blood BCR/ABL showed 9.3% on 11/02/12 (58-month mark); bone marrow biopsy cytogenetic pending from 11/02/12.   CURRENT THERAPY:  Started on Dasatinib in 07/2012.   INTERVAL HISTORY: Evan [name] 72 y.o. male returns for a routine visit with his wife.  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 Rash present for 1 month and getting better. Rash is pruritic. No drainage or pain noted.  Patient denies fever, anorexia, weight loss, headache, visual changes, confusion, drenching night sweats, palpable lymph node swelling, mucositis, odynophagia, dysphagia, nausea vomiting, jaundice, chest pain, palpitation, shortness of breath, dyspnea on exertion, productive cough, gum bleeding, epistaxis, hematemesis, hemoptysis, abdominal pain, abdominal swelling, early satiety, melena, hematochezia, hematuria, skin rash, spontaneous bleeding, joint swelling, heat or cold intolerance, bowel bladder incontinence, back pain, focal motor weakness, paresthesia, depression.    Past Medical History  Diagnosis Date  . Chronic back pain greater than 3 months duration   . Bulging discs   . Diabetes mellitus     type II; neuropathy;   . Hypertension   . High cholesterol   . Leukocytosis   . GERD (gastroesophageal reflux disease)   . Pancreatitis 2004  . OSA (obstructive sleep apnea) 2010  . Anxiety   . DDD (degenerative disc disease) 2005   lumbar spine  . History of smoking 08/01/2012  . Weight loss 08/01/2012  . Cough 08/01/2012  . 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 Left leg pain 08/05/2012    Past Surgical History  Procedure Laterality Date  . Cholecystectomy    . Hernia repair    . Skin cancer removed  2012    right ear, basal cell carcinoma.   Marland Kitchen Spinal stretching    . Colonoscopy  2012    [facility]    Current Outpatient Prescriptions  Medication Sig Dispense Refill  . aspirin EC 81 MG tablet Take 81 mg by mouth daily.      . B Complex-C (SUPER B COMPLEX/VITAMIN C PO) Take by mouth daily.      . dasatinib (SPRYCEL) 50 MG tablet Take 1 tablet (50 mg total) by mouth daily.  90 tablet  1  . fluocinonide cream (LIDEX) 0.05 % As needed      . insulin glargine (LANTUS) 100 UNIT/ML injection Inject 35 Units into the skin at bedtime.       Marland Kitchen losartan (COZAAR) 100 MG tablet Take 100 mg by mouth daily.      . metFORMIN (GLUCOPHAGE) 1000 MG tablet Take 1,000 mg by mouth 2 (two) times daily with a meal.      . omeprazole (PRILOSEC) 20 MG capsule Take 20 mg by mouth. As needed      . ondansetron (ZOFRAN) 8 MG tablet Take 1 tablet (8 mg total) by mouth every 8 (eight) hours as needed for nausea.  20 tablet  2  . saxagliptin HCl (ONGLYZA) 5 MG TABS tablet Take 5 mg by mouth daily.      Marland Kitchen  simvastatin (ZOCOR) 20 MG tablet Take 20 mg by mouth every evening.       No current facility-administered medications for this visit.    ALLERGIES:  has No Known Allergies.  REVIEW OF SYSTEMS:  The rest of the 14-point review of system was negative.   Filed Vitals:   02/02/13 1028  BP: 141/71  Pulse: 71  Temp: 97.8 F (36.6 C)  Resp: 20   Wt Readings from Last 3 Encounters:  02/02/13 246 lb 8 oz (111.812 kg)  11/10/12 242 lb 9.6 oz (110.043 kg)  09/05/12 238 lb 3.2 oz (108.047 kg)   ECOG Performance status: 1-2  PHYSICAL EXAMINATION:   General: well-nourished man, in no acute distress. Eyes: no scleral icterus. ENT:  There were no oropharyngeal lesions. Neck was without thyromegaly. Lymphatics: Negative cervical, supraclavicular or axillary adenopathy. Respiratory: lungs were clear bilaterally without wheezing or crackles. Cardiovascular: Regular rate and rhythm, S1/S2, without murmur, rub or gallop. There was no pedal edema. GI: abdomen was soft, flat, nontender, nondistended. There was splenomegaly about 5 cm below costal margin. There was no fluid wave or hepatomegaly. Muscoloskeletal: no spinal tenderness of palpation of vertebral spine.  I could not palpate any left popliteal/posterior knee/calf/thigh nodule.   Skin exam was without echymosis, petichae. Dry skin noted. He has a dark pink rash to his left flank. No drainage noted. Neuro exam was nonfocal. Patient was able to get on and off exam table without assistance. Gait was normal. Patient was alerted and oriented. Attention was good. Language was appropriate. Mood was normal without depression. Speech was not pressured. Thought content was not tangential.     LABORATORY/RADIOLOGY DATA:  Lab Results  Component Value Date   WBC 7.2 02/02/2013   HGB 13.5 02/02/2013   HCT 38.5 02/02/2013   PLT 145 02/02/2013   GLUCOSE 150* 02/02/2013   CHOL  Value: 93        ATP III CLASSIFICATION:  <200     mg/dL   Desirable  440-102  mg/dL   Borderline High  >=725    mg/dL   High        36/64/4034   TRIG 123 09/10/2010   HDL 32* 09/10/2010   LDLCALC  Value: 36        Total Cholesterol/HDL:CHD Risk Coronary Heart Disease Risk Table                     Men   Women  1/2 Average Risk   3.4   3.3  Average Risk       5.0   4.4  2 X Average Risk   9.6   7.1  3 X Average Risk  23.4   11.0        Use the calculated Patient Ratio above and the CHD Risk Table to determine the patient's CHD Risk.        ATP III CLASSIFICATION (LDL):  <100     mg/dL   Optimal  742-595  mg/dL   Near or Above                    Optimal  130-159  mg/dL   Borderline  638-756  mg/dL   High  >433     mg/dL    Very High 29/51/8841   ALKPHOS 59 02/02/2013   ALT 21 02/02/2013   AST 20 02/02/2013   NA 141 02/02/2013   K 4.1 02/02/2013   CL 106 02/02/2013  CREATININE 1.4* 02/02/2013   BUN 17.7 02/02/2013   CO2 27 02/02/2013   INR 1.11 08/01/2012   HGBA1C  Value: 7.3 (NOTE)                                                                       According to the ADA Clinical Practice Recommendations for 2011, when HbA1c is used as a screening test:   >=6.5%   Diagnostic of Diabetes Mellitus           (if abnormal result  is confirmed)  5.7-6.4%   Increased risk of developing Diabetes Mellitus  References:Diagnosis and Classification of Diabetes Mellitus,Diabetes Care,2011,34(Suppl 1):S62-S69 and Standards of Medical Care in         Diabetes - 2011,Diabetes Care,2011,34  (Suppl 1):S11-S61.* 09/09/2010    ASSESSMENT AND PLAN:   1. Diabetes mellitus, type II: He is on insulin, metformin, saxagliptin per PCP.  2. Hypertension: Well controlled on Losartan per PCP.  3. Hyperlipidemia: On simvastatin per PCP.  4. History of smoking: none at this time.  5. Subcm left lobe nodule:  Given his history of smoking and weight loss, I got a screening CXR which showed possible lung nodule.  I followed up with a chest CT which showed COPD and a subcm 5mm left lung nodule.  He will have a repeat CT of the chest within 7-10 days to endure stability of this nodule. 6.  Chronic myelogenous leukemia (CML); chronic phase.   *  Treatment:  Was on dasatinib 100mg  PO daily; but was having cytopenia.  Dose now has been stable at 50mg  PO daily with normal CBC. His EKG showed normal QT interval.  He does not have ssx of pleural effusion.  There is no other side effects.   *  He has achieved complete hematologic response, very good partial molecular response (cytogeneic response assessment pending) at 3 months.  *  I recommended continuation of dasatinib at 50mg  PO daily.  I will follow up with monthly CBC and adjust the dose as needed.   Return visit in about 3 months.  7. Rash: given the pattern of this rash, I am suspicious that he may have had shingles that is now resolving. Rash is not typical of drug rash. I have recommended use of Hydrocortisone cream to the rash BID. He may also use moisturizers given his dry skin.  The length of time of the face-to-face encounter was 25 minutes. More than 50% of time was spent counseling and coordination of care.

## 2013-02-02 NOTE — Telephone Encounter (Signed)
gv and printed appt schedule for pt for April thru Aug...pt aware cs will call with d/t of ct

## 2013-02-09 ENCOUNTER — Telehealth: Payer: Self-pay | Admitting: *Deleted

## 2013-02-09 ENCOUNTER — Ambulatory Visit (HOSPITAL_COMMUNITY)
Admission: RE | Admit: 2013-02-09 | Discharge: 2013-02-09 | Disposition: A | Discharge status: Home / Self Care | Payer: Medicare Other | Source: Ambulatory Visit | Attending: Oncology | Admitting: Oncology

## 2013-02-09 DIAGNOSIS — I2510 Atherosclerotic heart disease of native coronary artery without angina pectoris: Secondary | ICD-10-CM | POA: Insufficient documentation

## 2013-02-09 DIAGNOSIS — R059 Cough, unspecified: Secondary | ICD-10-CM | POA: Insufficient documentation

## 2013-02-09 DIAGNOSIS — C9210 Chronic myeloid leukemia, BCR/ABL-positive, not having achieved remission: Secondary | ICD-10-CM | POA: Insufficient documentation

## 2013-02-09 DIAGNOSIS — R05 Cough: Secondary | ICD-10-CM | POA: Insufficient documentation

## 2013-02-09 DIAGNOSIS — R911 Solitary pulmonary nodule: Secondary | ICD-10-CM | POA: Insufficient documentation

## 2013-02-09 MED ORDER — IOHEXOL 300 MG/ML  SOLN
80.0000 mL | Freq: Once | INTRAMUSCULAR | Status: AC | PRN
  Administered 2013-02-09: 80 mL via INTRAVENOUS

## 2013-02-09 NOTE — Telephone Encounter (Signed)
Message copied by Wende Mott on Thu Feb 09, 2013  4:16 PM ------      Message from: Clenton Pare R      Created: Thu Feb 09, 2013  4:02 PM       Please call pt. Pulmonary nodule remains stable. Likely represents scar tissue. ------

## 2013-02-10 NOTE — Telephone Encounter (Signed)
Left VM for pt to return call for scan results.

## 2013-03-02 ENCOUNTER — Other Ambulatory Visit (HOSPITAL_BASED_OUTPATIENT_CLINIC_OR_DEPARTMENT_OTHER): Payer: Medicare Other | Admitting: Lab

## 2013-03-02 DIAGNOSIS — C9210 Chronic myeloid leukemia, BCR/ABL-positive, not having achieved remission: Secondary | ICD-10-CM

## 2013-03-02 LAB — CBC WITH DIFFERENTIAL/PLATELET
BASO%: 1.1 % (ref 0.0–2.0)
Basophils Absolute: 0.1 10*3/uL (ref 0.0–0.1)
HCT: 40.9 % (ref 38.4–49.9)
HGB: 14.2 g/dL (ref 13.0–17.1)
MCHC: 34.6 g/dL (ref 32.0–36.0)
MONO#: 0.8 10*3/uL (ref 0.1–0.9)
NEUT%: 47.3 % (ref 39.0–75.0)
RDW: 14.8 % — ABNORMAL HIGH (ref 11.0–14.6)
WBC: 8.3 10*3/uL (ref 4.0–10.3)
lymph#: 3.1 10*3/uL (ref 0.9–3.3)

## 2013-03-27 ENCOUNTER — Telehealth: Payer: Self-pay | Admitting: *Deleted

## 2013-03-27 ENCOUNTER — Encounter: Payer: Self-pay | Admitting: *Deleted

## 2013-03-27 NOTE — Progress Notes (Signed)
Fax rec'd from [facility] pharmacy they processed pt's Sprycel for delivery on 03/28/13.

## 2013-03-27 NOTE — Telephone Encounter (Signed)
Wife left VM informing pt has new hip pain.  Called pt and he reports hip pain on one side only. It hurts when standing or walking and relieved with rest.  Instructed pt to call PCP for hip pain if it does not improve and likely not r/t to his cancer diagnosis or treatment per Dr. Gaylyn Rong.  He verbalized understanding.

## 2013-03-28 ENCOUNTER — Other Ambulatory Visit (HOSPITAL_BASED_OUTPATIENT_CLINIC_OR_DEPARTMENT_OTHER): Payer: Medicare Other

## 2013-03-28 DIAGNOSIS — C9210 Chronic myeloid leukemia, BCR/ABL-positive, not having achieved remission: Secondary | ICD-10-CM

## 2013-03-28 LAB — CBC WITH DIFFERENTIAL/PLATELET
Basophils Absolute: 0.1 10*3/uL (ref 0.0–0.1)
EOS%: 5.3 % (ref 0.0–7.0)
Eosinophils Absolute: 0.5 10*3/uL (ref 0.0–0.5)
HCT: 40.1 % (ref 38.4–49.9)
HGB: 14 g/dL (ref 13.0–17.1)
MCH: 33.8 pg — ABNORMAL HIGH (ref 27.2–33.4)
MONO#: 0.7 10*3/uL (ref 0.1–0.9)
NEUT#: 4.4 10*3/uL (ref 1.5–6.5)
NEUT%: 49.9 % (ref 39.0–75.0)
RDW: 14.3 % (ref 11.0–14.6)
lymph#: 3.2 10*3/uL (ref 0.9–3.3)

## 2013-03-30 ENCOUNTER — Other Ambulatory Visit: Payer: Medicare Other

## 2013-04-24 ENCOUNTER — Encounter: Payer: Self-pay | Admitting: *Deleted

## 2013-04-24 NOTE — Progress Notes (Signed)
Fax received from [facility], they will ship Sprycel for delivery on 04/26/13.

## 2013-04-27 ENCOUNTER — Encounter: Payer: Self-pay | Admitting: *Deleted

## 2013-04-27 ENCOUNTER — Telehealth: Payer: Self-pay | Admitting: Oncology

## 2013-04-27 ENCOUNTER — Encounter: Payer: Self-pay | Admitting: Oncology

## 2013-04-27 ENCOUNTER — Ambulatory Visit (HOSPITAL_BASED_OUTPATIENT_CLINIC_OR_DEPARTMENT_OTHER): Payer: Medicare Other | Admitting: Oncology

## 2013-04-27 ENCOUNTER — Other Ambulatory Visit (HOSPITAL_BASED_OUTPATIENT_CLINIC_OR_DEPARTMENT_OTHER): Payer: Medicare Other | Admitting: Lab

## 2013-04-27 VITALS — BP 122/61 | HR 81 | Temp 97.1°F | Resp 18 | Ht 74.0 in | Wt 253.8 lb

## 2013-04-27 DIAGNOSIS — I10 Essential (primary) hypertension: Secondary | ICD-10-CM

## 2013-04-27 DIAGNOSIS — C9210 Chronic myeloid leukemia, BCR/ABL-positive, not having achieved remission: Secondary | ICD-10-CM

## 2013-04-27 DIAGNOSIS — E119 Type 2 diabetes mellitus without complications: Secondary | ICD-10-CM

## 2013-04-27 LAB — COMPREHENSIVE METABOLIC PANEL (CC13)
AST: 21 U/L (ref 5–34)
Albumin: 3.9 g/dL (ref 3.5–5.0)
BUN: 17.3 mg/dL (ref 7.0–26.0)
Calcium: 10 mg/dL (ref 8.4–10.4)
Chloride: 107 mEq/L (ref 98–107)
Glucose: 212 mg/dl — ABNORMAL HIGH (ref 70–99)
Potassium: 4.3 mEq/L (ref 3.5–5.1)

## 2013-04-27 LAB — CBC WITH DIFFERENTIAL/PLATELET
Basophils Absolute: 0.1 10*3/uL (ref 0.0–0.1)
Eosinophils Absolute: 0.5 10*3/uL (ref 0.0–0.5)
HGB: 13.8 g/dL (ref 13.0–17.1)
MONO#: 0.7 10*3/uL (ref 0.1–0.9)
NEUT#: 3.5 10*3/uL (ref 1.5–6.5)
RDW: 14.8 % — ABNORMAL HIGH (ref 11.0–14.6)
lymph#: 2.6 10*3/uL (ref 0.9–3.3)

## 2013-04-27 NOTE — Telephone Encounter (Signed)
lvm for pt regarding to Dr. Gaylyn Rong cancelling July and Aug lab only per email...and Dr. Gaylyn Rong will be sending dentist info by fax.Marland KitchenMarland KitchenMarland Kitchen

## 2013-04-27 NOTE — Progress Notes (Signed)
Faxed letter from Dr. Gaylyn Rong to Dr. Lorelle Formosa at fax [phone].

## 2013-04-27 NOTE — Telephone Encounter (Signed)
gv and printed appt sched and avs for pt  °

## 2013-04-27 NOTE — Progress Notes (Signed)
[facility]  Telephone:(336) [phone] Fax:(336) [phone]   OFFICE PROGRESS NOTE   Cc:  Lillia Mountain, MD  DIAGNOSIS:  Chronic phase, chronic myelogenous leukemia.  Presenting WBC of 140K; peripheral blood BCR/ABL1 by PCR showed 209.05% b2a2 type and b3a2 type.  Bone marrow biopsy on 08/02/2012 was consistent with CML and 3% blast.  Follow up bone marrow biopsy on 11/02/2012 (37-month mark) showed normal bone marrow morphologically.  Peripheral blood BCR/ABL showed 9.3% on 11/02/12 (21-month mark); bone marrow biopsy cytogenetic pending from 11/02/12.   CURRENT THERAPY:  Started on Dasatinib in 07/2012.   INTERVAL HISTORY: Evan [name] 72 y.o. male returns for discussion with his wife and a grandson.  He reported mild fatigue when he works in the yard.  However, he is able to work out at the [facility] 3-4x/day with 45-60 min each time.  He has good appetite and continues to gain weight.    Patient denies fever, anorexia, weight loss, headache, visual changes, confusion, drenching night sweats, palpable lymph node swelling, mucositis, odynophagia, dysphagia, nausea vomiting, jaundice, chest pain, palpitation, shortness of breath, dyspnea on exertion, productive cough, gum bleeding, epistaxis, hematemesis, hemoptysis, abdominal pain, abdominal swelling, early satiety, melena, hematochezia, hematuria, skin rash, spontaneous bleeding, joint swelling, joint pain, heat or cold intolerance, bowel bladder incontinence, back pain, focal motor weakness, paresthesia, depression.    Past Medical History  Diagnosis Date  . Chronic back pain greater than 3 months duration   . Bulging discs   . Diabetes mellitus     type II; neuropathy;   . Hypertension   . High cholesterol   . Leukocytosis   . GERD (gastroesophageal reflux disease)   . Pancreatitis 2004  . OSA (obstructive sleep apnea) 2010  . Anxiety   . DDD (degenerative disc disease) 2005    lumbar spine  . History of  smoking 08/01/2012  . Weight loss 08/01/2012  . Cough 08/01/2012  . 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 Left leg pain 08/05/2012    Past Surgical History  Procedure Laterality Date  . Cholecystectomy    . Hernia repair    . Skin cancer removed  2012    right ear, basal cell carcinoma.   Marland Kitchen Spinal stretching    . Colonoscopy  2012    [facility]    Current Outpatient Prescriptions  Medication Sig Dispense Refill  . aspirin EC 81 MG tablet Take 81 mg by mouth daily.      . B Complex-C (SUPER B COMPLEX/VITAMIN C PO) Take by mouth daily.      . dasatinib (SPRYCEL) 50 MG tablet Take 1 tablet (50 mg total) by mouth daily.  90 tablet  1  . fluocinonide cream (LIDEX) 0.05 % As needed      . insulin glargine (LANTUS) 100 UNIT/ML injection Inject 35 Units into the skin at bedtime.       Marland Kitchen losartan (COZAAR) 100 MG tablet Take 100 mg by mouth daily.      . metFORMIN (GLUCOPHAGE) 1000 MG tablet Take 1,000 mg by mouth 2 (two) times daily with a meal.      . omeprazole (PRILOSEC) 20 MG capsule Take 20 mg by mouth. As needed      . ondansetron (ZOFRAN) 8 MG tablet Take 1 tablet (8 mg total) by mouth every 8 (eight) hours as needed for nausea.  20 tablet  2  . saxagliptin HCl (ONGLYZA) 5 MG TABS tablet Take 5 mg by mouth daily.      Marland Kitchen  simvastatin (ZOCOR) 20 MG tablet Take 20 mg by mouth every evening.       No current facility-administered medications for this visit.    ALLERGIES:  has No Known Allergies.  REVIEW OF SYSTEMS:  The rest of the 14-point review of system was negative.   Filed Vitals:   04/27/13 0953  BP: 122/61  Pulse: 81  Temp: 97.1 F (36.2 C)  Resp: 18   Wt Readings from Last 3 Encounters:  04/27/13 253 lb 12.8 oz (115.123 kg)  02/02/13 246 lb 8 oz (111.812 kg)  11/10/12 242 lb 9.6 oz (110.043 kg)   ECOG Performance status: 0-1  PHYSICAL EXAMINATION:   General: well-nourished man, in no acute distress. Eyes: no scleral icterus. ENT: There were no oropharyngeal  lesions. Neck was without thyromegaly. Lymphatics: Negative cervical, supraclavicular or axillary adenopathy. Respiratory: lungs were clear bilaterally without wheezing or crackles. Cardiovascular: Regular rate and rhythm, S1/S2, without murmur, rub or gallop. There was no pedal edema. GI: abdomen was soft, flat, nontender, nondistended. There was no organomegaly. Muscoloskeletal: no spinal tenderness of palpation of vertebral spine.  I could not palpate any left popliteal/posterior knee/calf/thigh nodule.   Skin exam was without echymosis, petichae. Neuro exam was nonfocal. Patient was able to get on and off exam table without assistance. Gait was normal. Patient was alert and oriented. Attention was good. Language was appropriate. Mood was normal without depression. Speech was not pressured. Thought content was not tangential.        LABORATORY/RADIOLOGY DATA:  Lab Results  Component Value Date   WBC 7.4 04/27/2013   HGB 13.8 04/27/2013   HCT 39.2 04/27/2013   PLT 152 04/27/2013   GLUCOSE 212* 04/27/2013   CHOL  Value: 93        ATP III CLASSIFICATION:  <200     mg/dL   Desirable  161-096  mg/dL   Borderline High  >=045    mg/dL   High        40/98/1191   TRIG 123 09/10/2010   HDL 32* 09/10/2010   LDLCALC  Value: 36        Total Cholesterol/HDL:CHD Risk Coronary Heart Disease Risk Table                     Men   Women  1/2 Average Risk   3.4   3.3  Average Risk       5.0   4.4  2 X Average Risk   9.6   7.1  3 X Average Risk  23.4   11.0        Use the calculated Patient Ratio above and the CHD Risk Table to determine the patient's CHD Risk.        ATP III CLASSIFICATION (LDL):  <100     mg/dL   Optimal  478-295  mg/dL   Near or Above                    Optimal  130-159  mg/dL   Borderline  621-308  mg/dL   High  >657     mg/dL   Very High 84/69/6295   ALKPHOS 59 04/27/2013   ALT 18 04/27/2013   AST 21 04/27/2013   NA 142 04/27/2013   K 4.3 04/27/2013   CL 107 04/27/2013   CREATININE 1.5* 04/27/2013     BUN 17.3 04/27/2013   CO2 25 04/27/2013   INR 1.11 08/01/2012   HGBA1C  Value: 7.3 (  NOTE)                                                                       According to the ADA Clinical Practice Recommendations for 2011, when HbA1c is used as a screening test:   >=6.5%   Diagnostic of Diabetes Mellitus           (if abnormal result  is confirmed)  5.7-6.4%   Increased risk of developing Diabetes Mellitus  References:Diagnosis and Classification of Diabetes Mellitus,Diabetes Care,2011,34(Suppl 1):S62-S69 and Standards of Medical Care in         Diabetes - 2011,Diabetes Care,2011,34  (Suppl 1):S11-S61.* 09/09/2010     ASSESSMENT AND PLAN:   1. Diabetes mellitus, type II: He is on insulin, metformin, saxagliptin per PCP.  2. Hypertension: Well controlled on Losartan per PCP.  3. Hyperlipidemia: On simvastatin per PCP.  4. History of smoking: none at this time.  5. Subcm left lobe nodule:  Last CT chest in 01/2013 showed stability of the nodule.  The next CT chest should be around 01/2014.  7.  Chronic myelogenous leukemia (CML); chronic phase.   * Doing well on Sprycel with hematologic, cytogenetic, and molecular remission at 3 month mark bone marrow biopsy.   * No side effect on lower dose of Sprycel now except for grade 1 fatigue.  I recommended to continue this indefinitely.    *  Follow up:  Lab/and visit in about 3 months.    I informed Mr. Marcin that I am leaving the practice.  [facility] for [facility] to see another provider when he returns.   The length of time of the face-to-face encounter was 15 minutes. More than 50% of time was spent counseling and coordination of care.

## 2013-05-25 ENCOUNTER — Other Ambulatory Visit: Payer: Medicare Other

## 2013-06-01 ENCOUNTER — Encounter: Payer: Self-pay | Admitting: *Deleted

## 2013-06-01 NOTE — Progress Notes (Signed)
Fax rec'd from [facility], they shipped Sprycel to pt for delivery on 7/15.

## 2013-06-21 ENCOUNTER — Other Ambulatory Visit: Payer: Self-pay

## 2013-06-22 ENCOUNTER — Other Ambulatory Visit: Payer: Medicare Other

## 2013-07-18 ENCOUNTER — Other Ambulatory Visit: Payer: Self-pay

## 2013-07-18 DIAGNOSIS — C9210 Chronic myeloid leukemia, BCR/ABL-positive, not having achieved remission: Secondary | ICD-10-CM

## 2013-07-18 MED ORDER — DASATINIB 50 MG PO TABS: 50.0000 mg | ORAL_TABLET | Freq: Every day | ORAL | Status: DC

## 2013-07-26 ENCOUNTER — Telehealth: Payer: Self-pay | Admitting: Hematology and Oncology

## 2013-07-26 NOTE — Telephone Encounter (Signed)
Moved 9/12 appt to 10/3. lmonvm for pt and mailed schedule.

## 2013-07-27 ENCOUNTER — Telehealth: Payer: Self-pay | Admitting: *Deleted

## 2013-07-27 NOTE — Telephone Encounter (Signed)
Wife called to clarify changes made to pt's schedule.  Called back and s/w pt.. Informed of lab and office visit on 10/03 and no need to come in sooner unless he is having any problems.  Pt denies any new problems,  States continues to take Sprycel as directed and recently got refill.

## 2013-07-28 ENCOUNTER — Ambulatory Visit: Payer: Medicare Other

## 2013-07-28 ENCOUNTER — Other Ambulatory Visit: Payer: Medicare Other | Admitting: Lab

## 2013-08-18 ENCOUNTER — Other Ambulatory Visit: Payer: Medicare Other | Admitting: Lab

## 2013-08-18 ENCOUNTER — Ambulatory Visit (HOSPITAL_BASED_OUTPATIENT_CLINIC_OR_DEPARTMENT_OTHER): Payer: Medicare Other | Admitting: Lab

## 2013-08-18 ENCOUNTER — Telehealth: Payer: Self-pay | Admitting: Hematology and Oncology

## 2013-08-18 ENCOUNTER — Ambulatory Visit (HOSPITAL_BASED_OUTPATIENT_CLINIC_OR_DEPARTMENT_OTHER): Payer: Medicare Other | Admitting: Hematology and Oncology

## 2013-08-18 ENCOUNTER — Encounter: Payer: Self-pay | Admitting: Hematology and Oncology

## 2013-08-18 VITALS — BP 136/63 | HR 68 | Temp 97.5°F | Resp 18 | Ht 74.0 in | Wt 258.0 lb

## 2013-08-18 DIAGNOSIS — C9210 Chronic myeloid leukemia, BCR/ABL-positive, not having achieved remission: Secondary | ICD-10-CM

## 2013-08-18 DIAGNOSIS — R21 Rash and other nonspecific skin eruption: Secondary | ICD-10-CM

## 2013-08-18 DIAGNOSIS — G609 Hereditary and idiopathic neuropathy, unspecified: Secondary | ICD-10-CM

## 2013-08-18 DIAGNOSIS — Z85828 Personal history of other malignant neoplasm of skin: Secondary | ICD-10-CM

## 2013-08-18 LAB — CBC WITH DIFFERENTIAL/PLATELET
Basophils Absolute: 0.1 10*3/uL (ref 0.0–0.1)
Eosinophils Absolute: 0.4 10*3/uL (ref 0.0–0.5)
HCT: 38.4 % (ref 38.4–49.9)
HGB: 13.3 g/dL (ref 13.0–17.1)
MONO#: 0.8 10*3/uL (ref 0.1–0.9)
MONO%: 9.6 % (ref 0.0–14.0)
NEUT#: 3.7 10*3/uL (ref 1.5–6.5)
NEUT%: 44.1 % (ref 39.0–75.0)
Platelets: 170 10*3/uL (ref 140–400)
RDW: 14.6 % (ref 11.0–14.6)
WBC: 8.5 10*3/uL (ref 4.0–10.3)
lymph#: 3.5 10*3/uL — ABNORMAL HIGH (ref 0.9–3.3)

## 2013-08-18 LAB — COMPREHENSIVE METABOLIC PANEL (CC13)
AST: 21 U/L (ref 5–34)
BUN: 17 mg/dL (ref 7.0–26.0)
Calcium: 10 mg/dL (ref 8.4–10.4)
Chloride: 108 mEq/L (ref 98–109)
Creatinine: 1.4 mg/dL — ABNORMAL HIGH (ref 0.7–1.3)
Glucose: 113 mg/dl (ref 70–140)
Total Bilirubin: 0.54 mg/dL (ref 0.20–1.20)

## 2013-08-18 NOTE — Telephone Encounter (Signed)
Gave pt appt for lab and MD January 2015 °

## 2013-08-18 NOTE — Progress Notes (Signed)
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 I also collaborated the history with the patient. He was found to have significant and progressive leukocytosis last year and was subsequently referred here for further evaluation. On 08/02/2012, bone marrow aspirate and biopsy show hypocellular marrow with 3% blasts, consistent with chronic phase CML. A followup bone marrow aspirate and biopsy in December 2013 showed that his bone marrow was in normal morphology. His peripheral blood PCR able has improved significantly at 3 months. His last BCR able from June 2014 showed that he is in complete remission with major molecular response  INTERVAL HISTORY: Evan [name] 72 y.o. male returns for further followup. He is tolerating the chemotherapy pill well except for mild skin rash. The patient had history of skin cancer and was told to avoid the sun but he does a lot of yard work during daytime. He is able to tolerate the treatment well despite needing dose adjustment. When I ask him why does he have his dose adjusted, he say he was told that the previous dosage was to powerful. He denies any diarrhea. His energy level is fair. His skin a bit of weight since he was seen here. His diabetes appeared to be under control. He does have mild peripheral neuropathy in his hands and feet that bothers him. Denies any fevers, chills, or night sweats. I have reviewed the past medical history, past surgical history, social history and family history with the patient and they are unchanged from previous note.  ALLERGIES:  has No Known Allergies.  MEDICATIONS: Current outpatient prescriptions:aspirin EC 81 MG tablet, Take 81 mg by mouth daily., Disp: , Rfl: ;  B Complex-C (SUPER B COMPLEX/VITAMIN C PO), Take by mouth daily., Disp: , Rfl: ;  dasatinib (SPRYCEL)  50 MG tablet, Take 1 tablet (50 mg total) by mouth daily., Disp: 90 tablet, Rfl: 1;  fluocinonide cream (LIDEX) 0.05 %, As needed, Disp: , Rfl:  insulin glargine (LANTUS) 100 UNIT/ML injection, Inject 30 Units into the skin at bedtime. , Disp: , Rfl: ;  losartan (COZAAR) 100 MG tablet, Take 100 mg by mouth daily., Disp: , Rfl: ;  metFORMIN (GLUCOPHAGE) 1000 MG tablet, Take 1,000 mg by mouth 2 (two) times daily with a meal., Disp: , Rfl: ;  omeprazole (PRILOSEC) 20 MG capsule, Take 20 mg by mouth. As needed, Disp: , Rfl:  ondansetron (ZOFRAN) 8 MG tablet, Take 1 tablet (8 mg total) by mouth every 8 (eight) hours as needed for nausea., Disp: 20 tablet, Rfl: 2;  saxagliptin HCl (ONGLYZA) 5 MG TABS tablet, Take 5 mg by mouth daily., Disp: , Rfl: ;  simvastatin (ZOCOR) 20 MG tablet, Take 20 mg by mouth every evening., Disp: , Rfl:   REVIEW OF SYSTEMS:   Constitutional: Denies fevers, chills or abnormal weight loss Eyes: Denies blurriness of vision Ears, nose, mouth, throat, and face: Denies mucositis or sore throat Respiratory: Denies cough, dyspnea or wheezes Cardiovascular: Denies palpitation, chest discomfort or lower extremity swelling Gastrointestinal:  Denies nausea, heartburn or change in bowel habits Skin: Denies abnormal skin rashes Lymphatics: Denies new lymphadenopathy or easy bruising Neurological:Denies numbness, tingling or new weaknesses Behavioral/Psych: Mood is stable, no new changes  All other systems were reviewed with the patient and are negative.  PHYSICAL EXAMINATION: ECOG PERFORMANCE STATUS: 0 - Asymptomatic  Filed Vitals:   08/18/13 1142  BP: 136/63  Pulse: 68  Temp: 97.5 F (36.4 C)  Resp: 18   Filed Weights   08/18/13 1142  Weight: 258 lb (117.028 kg)    GENERAL:alert, no distress and comfortable patient is moderately obese SKIN: skin color, texture, turgor are normal, no rashes or significant lesions EYES: normal, Conjunctiva are pink and non-injected, sclera  clear OROPHARYNX:no exudate, no erythema and lips, buccal mucosa, and tongue normal  NECK: supple, thyroid normal size, non-tender, without nodularity LYMPH:  no palpable lymphadenopathy in the cervical, axillary or inguinal LUNGS: clear to auscultation and percussion with normal breathing effort HEART: regular rate & rhythm and no murmurs and no lower extremity edema ABDOMEN:abdomen soft, non-tender and normal bowel sounds no splenomegaly Musculoskeletal:no cyanosis of digits and no clubbing  NEURO: alert & oriented x 3 with fluent speech, no focal motor/sensory deficits  LABORATORY DATA:  I have reviewed the data as listed    Component Value Date/Time   NA 142 04/27/2013 0926   NA 141 09/10/2010 0650   K 4.3 04/27/2013 0926   K 4.7 09/10/2010 0650   CL 107 04/27/2013 0926   CL 110 09/10/2010 0650   CO2 25 04/27/2013 0926   CO2 26 09/10/2010 0650   GLUCOSE 212* 04/27/2013 0926   GLUCOSE 166* 09/10/2010 0650   BUN 17.3 04/27/2013 0926   BUN 9 09/10/2010 0650   CREATININE 1.5* 04/27/2013 0926   CREATININE 1.09 09/10/2010 0650   CALCIUM 10.0 04/27/2013 0926   CALCIUM 8.9 09/10/2010 0650   PROT 7.0 04/27/2013 0926   PROT 5.3* 09/10/2010 0650   ALBUMIN 3.9 04/27/2013 0926   ALBUMIN 3.0* 09/10/2010 0650   AST 21 04/27/2013 0926   AST 18 09/10/2010 0650   ALT 18 04/27/2013 0926   ALT 20 09/10/2010 0650   ALKPHOS 59 04/27/2013 0926   ALKPHOS 49 09/10/2010 0650   BILITOT 0.47 04/27/2013 0926   BILITOT 0.7 09/10/2010 0650   GFRNONAA >60 09/10/2010 0650   GFRAA  Value: >60        The eGFR has been calculated using the MDRD equation. This calculation has not been validated in all clinical situations. eGFR's persistently <60 mL/min signify possible Chronic Kidney Disease. 09/10/2010 0650    No results found for this basename: SPEP, UPEP,  kappa and lambda light chains    Lab Results  Component Value Date   WBC 7.4 04/27/2013   NEUTROABS 3.5 04/27/2013   HGB 13.8 04/27/2013   HCT 39.2  04/27/2013   MCV 97.3 04/27/2013   PLT 152 04/27/2013      Chemistry      Component Value Date/Time   NA 142 04/27/2013 0926   NA 141 09/10/2010 0650   K 4.3 04/27/2013 0926   K 4.7 09/10/2010 0650   CL 107 04/27/2013 0926   CL 110 09/10/2010 0650   CO2 25 04/27/2013 0926   CO2 26 09/10/2010 0650   BUN 17.3 04/27/2013 0926   BUN 9 09/10/2010 0650   CREATININE 1.5* 04/27/2013 0926   CREATININE 1.09 09/10/2010 0650      Component Value Date/Time   CALCIUM 10.0 04/27/2013 0926   CALCIUM 8.9 09/10/2010 0650   ALKPHOS 59 04/27/2013 0926   ALKPHOS 49 09/10/2010 0650   AST 21 04/27/2013 0926   AST 18 09/10/2010 0650   ALT 18 04/27/2013 0926   ALT 20 09/10/2010 0650   BILITOT 0.47 04/27/2013 0926   BILITOT 0.7 09/10/2010 0650     ASSESSMENT: CML in remission  PLAN:  #1 CML The patient is tolerating current dosage of chemotherapy well and if achieve remission. We'll continue on this indefinitely. I will go ahead and repeat blood work with CBC with differential, complete metabolic panel, as well as BCR/ABL for further assessment. He needs to be seeing her every 3 months with history, physical examination, and blood work. #2 peripheral neuropathy This is not related to chemotherapy. This is likely related to his diabetes. He'll continue conservative management #3 skin rash This is likely related to his medication side effects. I recommended avoidance of excessive sun exposure #4 preventive care I recommend vitamin D supplements. I also recommend yearly influenza vaccination to avoid shingles vaccine. #5 skin cancer He will continue followup with his dermatologist. As mentioned above I recommend the patient to avoid excessive sun exposure.  All questions were answered. The patient knows to call the clinic with any problems, questions or concerns. We can certainly see the patient much sooner if necessary. No barriers to learning was detected. I spent 25 minutes counseling the patient face to  face. The total time spent in the appointment was 40 minutes and more than 50% was on counseling and review of test results     [facility], Jermie Hippe, MD 08/18/2013 12:41 PM

## 2013-08-24 ENCOUNTER — Encounter: Payer: Self-pay | Admitting: *Deleted

## 2013-08-24 NOTE — Progress Notes (Signed)
Fax rec'd from [employer].  They shipped Sprycel to pt for delivery on 08/24/13.

## 2013-09-18 ENCOUNTER — Encounter: Payer: Self-pay | Admitting: *Deleted

## 2013-09-18 NOTE — Progress Notes (Signed)
Fax received from [employer]. sprycel will be shipped to pt for delivery on 09/22/13.

## 2013-09-21 ENCOUNTER — Other Ambulatory Visit: Payer: Self-pay

## 2013-10-23 ENCOUNTER — Encounter: Payer: Self-pay | Admitting: *Deleted

## 2013-10-23 NOTE — Progress Notes (Signed)
Received fax from [facility] stating Sprycel tablets will be shipped for delivery on 10/24/13

## 2013-11-06 ENCOUNTER — Other Ambulatory Visit: Payer: Self-pay | Admitting: Hematology and Oncology

## 2013-11-06 DIAGNOSIS — C9210 Chronic myeloid leukemia, BCR/ABL-positive, not having achieved remission: Secondary | ICD-10-CM

## 2013-11-07 ENCOUNTER — Other Ambulatory Visit (HOSPITAL_BASED_OUTPATIENT_CLINIC_OR_DEPARTMENT_OTHER): Payer: Medicare Other

## 2013-11-07 DIAGNOSIS — C9210 Chronic myeloid leukemia, BCR/ABL-positive, not having achieved remission: Secondary | ICD-10-CM

## 2013-11-07 LAB — COMPREHENSIVE METABOLIC PANEL (CC13)
AST: 21 U/L (ref 5–34)
Albumin: 3.8 g/dL (ref 3.5–5.0)
Alkaline Phosphatase: 67 U/L (ref 40–150)
BUN: 18 mg/dL (ref 7.0–26.0)
CO2: 24 mEq/L (ref 22–29)
Calcium: 10.2 mg/dL (ref 8.4–10.4)
Chloride: 109 mEq/L (ref 98–109)
Creatinine: 1.4 mg/dL — ABNORMAL HIGH (ref 0.7–1.3)
Glucose: 198 mg/dl — ABNORMAL HIGH (ref 70–140)
Potassium: 4.6 mEq/L (ref 3.5–5.1)
Sodium: 143 mEq/L (ref 136–145)
Total Protein: 7.2 g/dL (ref 6.4–8.3)

## 2013-11-07 LAB — CBC WITH DIFFERENTIAL/PLATELET
BASO%: 1.2 % (ref 0.0–2.0)
Basophils Absolute: 0.1 10*3/uL (ref 0.0–0.1)
EOS%: 5.8 % (ref 0.0–7.0)
Eosinophils Absolute: 0.4 10*3/uL (ref 0.0–0.5)
HCT: 40.2 % (ref 38.4–49.9)
HGB: 13.9 g/dL (ref 13.0–17.1)
MCH: 34 pg — ABNORMAL HIGH (ref 27.2–33.4)
MCV: 98.1 fL — ABNORMAL HIGH (ref 79.3–98.0)
MONO#: 0.7 10*3/uL (ref 0.1–0.9)
MONO%: 9.4 % (ref 0.0–14.0)
NEUT#: 3.7 10*3/uL (ref 1.5–6.5)
NEUT%: 50.5 % (ref 39.0–75.0)
RBC: 4.1 10*6/uL — ABNORMAL LOW (ref 4.20–5.82)
RDW: 14.2 % (ref 11.0–14.6)

## 2013-11-17 ENCOUNTER — Encounter: Payer: Self-pay | Admitting: *Deleted

## 2013-11-17 NOTE — Progress Notes (Signed)
Fax received from [employer], they will ship Sprycel to pt for delivery on 11/23/13.

## 2013-11-20 ENCOUNTER — Encounter: Payer: Self-pay | Admitting: Hematology and Oncology

## 2013-11-20 ENCOUNTER — Telehealth: Payer: Self-pay | Admitting: Hematology and Oncology

## 2013-11-20 ENCOUNTER — Ambulatory Visit (HOSPITAL_BASED_OUTPATIENT_CLINIC_OR_DEPARTMENT_OTHER): Payer: Medicare Other | Admitting: Hematology and Oncology

## 2013-11-20 VITALS — BP 150/62 | HR 93 | Temp 98.1°F | Resp 18 | Ht 74.0 in | Wt 257.2 lb

## 2013-11-20 DIAGNOSIS — I10 Essential (primary) hypertension: Secondary | ICD-10-CM

## 2013-11-20 DIAGNOSIS — C9211 Chronic myeloid leukemia, BCR/ABL-positive, in remission: Secondary | ICD-10-CM

## 2013-11-20 DIAGNOSIS — C9210 Chronic myeloid leukemia, BCR/ABL-positive, not having achieved remission: Secondary | ICD-10-CM

## 2013-11-20 DIAGNOSIS — E1142 Type 2 diabetes mellitus with diabetic polyneuropathy: Secondary | ICD-10-CM

## 2013-11-20 DIAGNOSIS — E1149 Type 2 diabetes mellitus with other diabetic neurological complication: Secondary | ICD-10-CM

## 2013-11-20 DIAGNOSIS — J069 Acute upper respiratory infection, unspecified: Secondary | ICD-10-CM

## 2013-11-20 NOTE — Telephone Encounter (Signed)
appts made per 1/5 POF LVMM on pts home phone shh

## 2013-11-20 NOTE — Progress Notes (Signed)
[facility] OFFICE PROGRESS NOTE  Patient Care Team: [name], MD as PCP - General (Internal Medicine) [name] as Referring Physician (Specialist) Provider Not In System [name], MD as Consulting Physician (Hematology and Oncology)  DIAGNOSIS: CML in molecular remission  SUMMARY OF ONCOLOGIC HISTORY: He was found to have significant and progressive leukocytosis last year and was subsequently referred here for further evaluation. On 08/02/2012, bone marrow aspirate and biopsy show hypocellular marrow with 3% blasts, consistent with chronic phase CML. A followup bone marrow aspirate and biopsy in December 2013 showed that his bone marrow was in normal morphology. His peripheral blood PCR able has improved significantly at 3 months. His last BCR able from June 2014 showed that he is in complete remission with major molecular response  INTERVAL HISTORY: Evan [name] 73 y.o. male returns for further followup. His only new complaint is a broken tooth. He also had upper respiratory tract congestion but no fevers or chills. He complained of mild insomnia. He tolerated chemotherapy well. Denies any shortness of breath, diarrhea or fatigue  I have reviewed the past medical history, past surgical history, social history and family history with the patient and they are unchanged from previous note.  ALLERGIES:  has No Known Allergies.  MEDICATIONS:  Current Outpatient Prescriptions  Medication Sig Dispense Refill  . aspirin EC 81 MG tablet Take 81 mg by mouth daily.      . B Complex-C (SUPER B COMPLEX/VITAMIN C PO) Take by mouth daily.      . dasatinib (SPRYCEL) 50 MG tablet Take 1 tablet (50 mg total) by mouth daily.  90 tablet  1  . fluocinonide cream (LIDEX) 0.05 % As needed      . insulin glargine (LANTUS) 100 UNIT/ML injection Inject 30 Units into the skin at bedtime.       Marland Kitchen losartan (COZAAR) 100 MG tablet Take 100 mg by mouth daily.      . metFORMIN  (GLUCOPHAGE) 1000 MG tablet Take 1,000 mg by mouth 2 (two) times daily with a meal.      . omeprazole (PRILOSEC) 20 MG capsule Take 20 mg by mouth. As needed      . ondansetron (ZOFRAN) 8 MG tablet Take 1 tablet (8 mg total) by mouth every 8 (eight) hours as needed for nausea.  20 tablet  2  . saxagliptin HCl (ONGLYZA) 5 MG TABS tablet Take 5 mg by mouth daily.      . simvastatin (ZOCOR) 20 MG tablet Take 20 mg by mouth every evening.       No current facility-administered medications for this visit.    REVIEW OF SYSTEMS:   Constitutional: Denies fevers, chills or abnormal weight loss Eyes: Denies blurriness of vision Respiratory: Denies cough, dyspnea or wheezes Cardiovascular: Denies palpitation, chest discomfort or lower extremity swelling Gastrointestinal:  Denies nausea, heartburn or change in bowel habits Skin: Denies abnormal skin rashes Lymphatics: Denies new lymphadenopathy or easy bruising Neurological:Denies numbness, tingling or new weaknesses Behavioral/Psych: Mood is stable, no new changes  All other systems were reviewed with the patient and are negative.  PHYSICAL EXAMINATION: ECOG PERFORMANCE STATUS: 0 - Asymptomatic  Filed Vitals:   11/20/13 0842  BP: 150/62  Pulse: 93  Temp: 98.1 F (36.7 C)  Resp: 18   Filed Weights   11/20/13 0842  Weight: 257 lb 3.2 oz (116.665 kg)    GENERAL:alert, no distress and comfortable. He is moderately obese SKIN: skin color, texture, turgor are  normal, no rashes or significant lesions EYES: normal, Conjunctiva are pink and non-injected, sclera clear OROPHARYNX:no exudate, no erythema and lips, buccal mucosa, and tongue normal . Noted poor dentition NECK: supple, thyroid normal size, non-tender, without nodularity LYMPH:  no palpable lymphadenopathy in the cervical, axillary or inguinal LUNGS: clear to auscultation and percussion with normal breathing effort HEART: regular rate & rhythm and no murmurs and no lower extremity  edema ABDOMEN:abdomen soft, non-tender and normal bowel sounds. No palpable splenomegaly Musculoskeletal:no cyanosis of digits and no clubbing  NEURO: alert & oriented x 3 with fluent speech, no focal motor/sensory deficits  LABORATORY DATA:  I have reviewed the data as listed    Component Value Date/Time   NA 143 11/07/2013 0857   NA 141 09/10/2010 0650   K 4.6 11/07/2013 0857   K 4.7 09/10/2010 0650   CL 107 04/27/2013 0926   CL 110 09/10/2010 0650   CO2 24 11/07/2013 0857   CO2 26 09/10/2010 0650   GLUCOSE 198* 11/07/2013 0857   GLUCOSE 212* 04/27/2013 0926   GLUCOSE 166* 09/10/2010 0650   BUN 18.0 11/07/2013 0857   BUN 9 09/10/2010 0650   CREATININE 1.4* 11/07/2013 0857   CREATININE 1.09 09/10/2010 0650   CALCIUM 10.2 11/07/2013 0857   CALCIUM 8.9 09/10/2010 0650   PROT 7.2 11/07/2013 0857   PROT 5.3* 09/10/2010 0650   ALBUMIN 3.8 11/07/2013 0857   ALBUMIN 3.0* 09/10/2010 0650   AST 21 11/07/2013 0857   AST 18 09/10/2010 0650   ALT 21 11/07/2013 0857   ALT 20 09/10/2010 0650   ALKPHOS 67 11/07/2013 0857   ALKPHOS 49 09/10/2010 0650   BILITOT 0.52 11/07/2013 0857   BILITOT 0.7 09/10/2010 0650   GFRNONAA >60 09/10/2010 0650   GFRAA  Value: >60        The eGFR has been calculated using the MDRD equation. This calculation has not been validated in all clinical situations. eGFR's persistently <60 mL/min signify possible Chronic Kidney Disease. 09/10/2010 0650    No results found for this basename: SPEP,  UPEP,   kappa and lambda light chains    Lab Results  Component Value Date   WBC 7.3 11/07/2013   NEUTROABS 3.7 11/07/2013   HGB 13.9 11/07/2013   HCT 40.2 11/07/2013   MCV 98.1* 11/07/2013   PLT 160 11/07/2013      Chemistry      Component Value Date/Time   NA 143 11/07/2013 0857   NA 141 09/10/2010 0650   K 4.6 11/07/2013 0857   K 4.7 09/10/2010 0650   CL 107 04/27/2013 0926   CL 110 09/10/2010 0650   CO2 24 11/07/2013 0857   CO2 26 09/10/2010 0650    BUN 18.0 11/07/2013 0857   BUN 9 09/10/2010 0650   CREATININE 1.4* 11/07/2013 0857   CREATININE 1.09 09/10/2010 0650      Component Value Date/Time   CALCIUM 10.2 11/07/2013 0857   CALCIUM 8.9 09/10/2010 0650   ALKPHOS 67 11/07/2013 0857   ALKPHOS 49 09/10/2010 0650   AST 21 11/07/2013 0857   AST 18 09/10/2010 0650   ALT 21 11/07/2013 0857   ALT 20 09/10/2010 0650   BILITOT 0.52 11/07/2013 0857   BILITOT 0.7 09/10/2010 0650     ASSESSMENT & PLAN:  #1 CML, in remission The patient is tolerating current dosage of chemotherapy well and has achieved a molecular remission. We'll continue on this indefinitely. I will go ahead and repeat blood work with CBC with  differential, complete metabolic panel, as well as BCR/ABL for further assessment. He needs to be seeing her every 3 months with history, physical examination, and blood work. #2 upper respiratory tract infection This is likely viral in nature. I recommend conservative management #3 poor dentition The patient has a broken tooth recently. There is no contraindication to proceed with dental extraction  #4 diabetes with peripheral neuropathy Continue current management #5 hypertension His blood pressure is mildly elevated. I recommend he contact primary care provider for medication adjustment  Orders Placed This Encounter  Procedures  . BCR-ABL    With RT-PCR technique    Standing Status: Future     Number of Occurrences:      Standing Expiration Date: 11/20/2014  . Comprehensive metabolic panel    Standing Status: Future     Number of Occurrences:      Standing Expiration Date: 11/20/2014  . CBC with Differential    Standing Status: Future     Number of Occurrences:      Standing Expiration Date: 08/12/2014   All questions were answered. The patient knows to call the clinic with any problems, questions or concerns. No barriers to learning was detected. I spent 15 minutes counseling the patient face to face. The total time  spent in the appointment was 20 minutes and more than 50% was on counseling and review of test results     [facility], [place], MD 11/20/2013 9:16 AM

## 2013-11-20 NOTE — Telephone Encounter (Signed)
s.w pt wife called to r/s April appt d/t done

## 2013-12-20 ENCOUNTER — Encounter: Payer: Self-pay | Admitting: *Deleted

## 2013-12-20 NOTE — Progress Notes (Signed)
Fax received from [place] state they have shipped Sprycel for delivery to pt on 12/21/13.

## 2014-01-15 ENCOUNTER — Encounter: Payer: Self-pay | Admitting: Hematology and Oncology

## 2014-01-15 ENCOUNTER — Other Ambulatory Visit: Payer: Self-pay | Admitting: *Deleted

## 2014-01-15 ENCOUNTER — Other Ambulatory Visit: Payer: Self-pay | Admitting: Hematology and Oncology

## 2014-01-15 DIAGNOSIS — C9210 Chronic myeloid leukemia, BCR/ABL-positive, not having achieved remission: Secondary | ICD-10-CM

## 2014-01-15 DIAGNOSIS — C9211 Chronic myeloid leukemia, BCR/ABL-positive, in remission: Secondary | ICD-10-CM

## 2014-01-15 HISTORY — DX: Chronic myeloid leukemia, BCR/ABL-positive, in remission: C92.11

## 2014-01-15 MED ORDER — DASATINIB 50 MG PO TABS: 50.0000 mg | ORAL_TABLET | Freq: Every day | ORAL | Status: DC

## 2014-01-15 NOTE — Telephone Encounter (Signed)
Rx for Sprycel Faxed To [place] at Fax [phone]

## 2014-01-19 ENCOUNTER — Encounter: Payer: Self-pay | Admitting: *Deleted

## 2014-01-19 NOTE — Progress Notes (Signed)
Received fax from [employer] [facility]. They will ship pt's Sprycel for delivery on 3/11/5.

## 2014-02-19 ENCOUNTER — Ambulatory Visit: Payer: Medicare Other | Admitting: Hematology and Oncology

## 2014-02-19 ENCOUNTER — Other Ambulatory Visit: Payer: Medicare Other

## 2014-02-20 ENCOUNTER — Ambulatory Visit (HOSPITAL_BASED_OUTPATIENT_CLINIC_OR_DEPARTMENT_OTHER): Payer: Medicare Other | Admitting: Hematology and Oncology

## 2014-02-20 ENCOUNTER — Other Ambulatory Visit (HOSPITAL_BASED_OUTPATIENT_CLINIC_OR_DEPARTMENT_OTHER): Payer: Medicare Other

## 2014-02-20 ENCOUNTER — Telehealth: Payer: Self-pay | Admitting: Hematology and Oncology

## 2014-02-20 ENCOUNTER — Encounter: Payer: Self-pay | Admitting: Hematology and Oncology

## 2014-02-20 VITALS — BP 129/69 | HR 74 | Temp 98.0°F | Resp 20 | Ht 74.0 in | Wt 258.9 lb

## 2014-02-20 DIAGNOSIS — E1142 Type 2 diabetes mellitus with diabetic polyneuropathy: Secondary | ICD-10-CM

## 2014-02-20 DIAGNOSIS — C9211 Chronic myeloid leukemia, BCR/ABL-positive, in remission: Secondary | ICD-10-CM

## 2014-02-20 DIAGNOSIS — E1140 Type 2 diabetes mellitus with diabetic neuropathy, unspecified: Secondary | ICD-10-CM | POA: Insufficient documentation

## 2014-02-20 DIAGNOSIS — C9210 Chronic myeloid leukemia, BCR/ABL-positive, not having achieved remission: Secondary | ICD-10-CM

## 2014-02-20 DIAGNOSIS — I10 Essential (primary) hypertension: Secondary | ICD-10-CM | POA: Insufficient documentation

## 2014-02-20 LAB — COMPREHENSIVE METABOLIC PANEL (CC13)
ALK PHOS: 66 U/L (ref 40–150)
ALT: 15 U/L (ref 0–55)
ANION GAP: 12 meq/L — AB (ref 3–11)
AST: 17 U/L (ref 5–34)
Albumin: 3.8 g/dL (ref 3.5–5.0)
BUN: 16 mg/dL (ref 7.0–26.0)
CO2: 24 meq/L (ref 22–29)
CREATININE: 1.6 mg/dL — AB (ref 0.7–1.3)
Calcium: 9.8 mg/dL (ref 8.4–10.4)
Chloride: 110 mEq/L — ABNORMAL HIGH (ref 98–109)
GLUCOSE: 234 mg/dL — AB (ref 70–140)
Potassium: 4.6 mEq/L (ref 3.5–5.1)
Sodium: 145 mEq/L (ref 136–145)
Total Bilirubin: 0.47 mg/dL (ref 0.20–1.20)
Total Protein: 6.8 g/dL (ref 6.4–8.3)

## 2014-02-20 LAB — CBC WITH DIFFERENTIAL/PLATELET
BASO%: 1.4 % (ref 0.0–2.0)
Basophils Absolute: 0.1 10*3/uL (ref 0.0–0.1)
EOS ABS: 0.3 10*3/uL (ref 0.0–0.5)
EOS%: 4.5 % (ref 0.0–7.0)
HCT: 39.5 % (ref 38.4–49.9)
HGB: 13.6 g/dL (ref 13.0–17.1)
LYMPH#: 2.1 10*3/uL (ref 0.9–3.3)
LYMPH%: 32.3 % (ref 14.0–49.0)
MCH: 33.9 pg — ABNORMAL HIGH (ref 27.2–33.4)
MCHC: 34.4 g/dL (ref 32.0–36.0)
MCV: 98.6 fL — ABNORMAL HIGH (ref 79.3–98.0)
MONO#: 0.6 10*3/uL (ref 0.1–0.9)
MONO%: 8.6 % (ref 0.0–14.0)
NEUT#: 3.5 10*3/uL (ref 1.5–6.5)
NEUT%: 53.2 % (ref 39.0–75.0)
Platelets: 155 10*3/uL (ref 140–400)
RBC: 4.01 10*6/uL — ABNORMAL LOW (ref 4.20–5.82)
RDW: 14.2 % (ref 11.0–14.6)
WBC: 6.5 10*3/uL (ref 4.0–10.3)

## 2014-02-20 NOTE — Telephone Encounter (Signed)
S/w the pt's dtr and she is aware of the July appts.

## 2014-02-20 NOTE — Progress Notes (Signed)
**Evan [name] De-Identified via Obfuscation** Evan Evan [name]  Patient Care Team: [name], MD as PCP - General (Internal Medicine) [name] as Referring Physician (Specialist) Provider Not In System [name], MD as Consulting Physician (Hematology and Oncology)  DIAGNOSIS: CML in remission  SUMMARY OF ONCOLOGIC HISTORY: He was found to have significant and progressive leukocytosis last year and was subsequently referred here for further evaluation. On 08/02/2012, bone marrow aspirate and biopsy show hypocellular marrow with 3% blasts, consistent with chronic phase CML. A followup bone marrow aspirate and biopsy in December 2013 showed that his bone marrow was in normal morphology. His peripheral blood PCR able has improved significantly at 3 months. His last BCR able from June 2014 showed that he is in complete remission with major molecular response   INTERVAL HISTORY: Evan Evan [name] 73 y.o. male returns for further followup. He has been feeling well. His main complaint is high blood sugar and peripheral neuropathy. Denies any recent infection. No abnormal skin rashes or diarrhea.  I have reviewed the past medical history, past surgical history, social history and family history with the patient and they are unchanged from previous Evan [name].  ALLERGIES:  has No Known Allergies.  MEDICATIONS:  Current Outpatient Prescriptions  Medication Sig Dispense Refill  . aspirin EC 81 MG tablet Take 81 mg by mouth daily.      . B Complex-C (SUPER B COMPLEX/VITAMIN C PO) Take by mouth daily.      . dasatinib (SPRYCEL) 50 MG tablet Take 1 tablet (50 mg total) by mouth daily.  90 tablet  6  . fluocinonide cream (LIDEX) 0.05 % As needed      . insulin glargine (LANTUS) 100 UNIT/ML injection Inject 30 Units into the skin at bedtime.       Marland Kitchen losartan (COZAAR) 100 MG tablet Take 100 mg by mouth daily.      . metFORMIN (GLUCOPHAGE) 1000 MG tablet Take 1,000 mg by mouth 2 (two) times daily with a  meal.      . omeprazole (PRILOSEC) 20 MG capsule Take 20 mg by mouth. As needed      . ondansetron (ZOFRAN) 8 MG tablet Take 1 tablet (8 mg total) by mouth every 8 (eight) hours as needed for nausea.  20 tablet  2  . saxagliptin HCl (ONGLYZA) 5 MG TABS tablet Take 5 mg by mouth daily.      . simvastatin (ZOCOR) 20 MG tablet Take 20 mg by mouth every evening.       No current facility-administered medications for this visit.    REVIEW OF SYSTEMS:   Constitutional: Denies fevers, chills or abnormal weight loss Eyes: Denies blurriness of vision Ears, nose, mouth, throat, and face: Denies mucositis or sore throat Respiratory: Denies cough, dyspnea or wheezes Cardiovascular: Denies palpitation, chest discomfort or lower extremity swelling Gastrointestinal:  Denies nausea, heartburn or change in bowel habits Skin: Denies abnormal skin rashes Lymphatics: Denies new lymphadenopathy or easy bruising Behavioral/Psych: Mood is stable, no new changes  All other systems were reviewed with the patient and are negative.  PHYSICAL EXAMINATION: ECOG PERFORMANCE STATUS: 1 - Symptomatic but completely ambulatory  Filed Vitals:   02/20/14 0928  BP: 129/69  Pulse: 74  Temp:   Resp:    Filed Weights   02/20/14 0919  Weight: 258 lb 14.4 oz (117.436 kg)    GENERAL:alert, no distress and comfortable. He is morbidly obese SKIN: skin color, texture, turgor are normal, no rashes or significant lesions EYES:  normal, Conjunctiva are pink and non-injected, sclera clear OROPHARYNX:no exudate, no erythema and lips, buccal mucosa, and tongue normal  NECK: supple, thyroid normal size, non-tender, without nodularity LYMPH:  no palpable lymphadenopathy in the cervical, axillary or inguinal LUNGS: clear to auscultation and percussion with normal breathing effort HEART: regular rate & rhythm and no murmurs and no lower extremity edema ABDOMEN:abdomen soft, non-tender and normal bowel sounds. Limited splenic  evaluation due to morbid obesity Musculoskeletal:no cyanosis of digits and no clubbing  NEURO: alert & oriented x 3 with fluent speech, no focal motor/sensory deficits  LABORATORY DATA:  I have reviewed the data as listed    Component Value Date/Time   NA 143 11/07/2013 0857   NA 141 09/10/2010 0650   K 4.6 11/07/2013 0857   K 4.7 09/10/2010 0650   CL 107 04/27/2013 0926   CL 110 09/10/2010 0650   CO2 24 11/07/2013 0857   CO2 26 09/10/2010 0650   GLUCOSE 198* 11/07/2013 0857   GLUCOSE 212* 04/27/2013 0926   GLUCOSE 166* 09/10/2010 0650   BUN 18.0 11/07/2013 0857   BUN 9 09/10/2010 0650   CREATININE 1.4* 11/07/2013 0857   CREATININE 1.09 09/10/2010 0650   CALCIUM 10.2 11/07/2013 0857   CALCIUM 8.9 09/10/2010 0650   PROT 7.2 11/07/2013 0857   PROT 5.3* 09/10/2010 0650   ALBUMIN 3.8 11/07/2013 0857   ALBUMIN 3.0* 09/10/2010 0650   AST 21 11/07/2013 0857   AST 18 09/10/2010 0650   ALT 21 11/07/2013 0857   ALT 20 09/10/2010 0650   ALKPHOS 67 11/07/2013 0857   ALKPHOS 49 09/10/2010 0650   BILITOT 0.52 11/07/2013 0857   BILITOT 0.7 09/10/2010 0650   GFRNONAA >60 09/10/2010 0650   GFRAA  Value: >60        The eGFR has been calculated using the MDRD equation. This calculation has not been validated in all clinical situations. eGFR's persistently <60 mL/min signify possible Chronic Kidney Disease. 09/10/2010 0650    No results found for this basename: SPEP,  UPEP,   kappa and lambda light chains    Lab Results  Component Value Date   WBC 6.5 02/20/2014   NEUTROABS 3.5 02/20/2014   HGB 13.6 02/20/2014   HCT 39.5 02/20/2014   MCV 98.6* 02/20/2014   PLT 155 02/20/2014      Chemistry      Component Value Date/Time   NA 143 11/07/2013 0857   NA 141 09/10/2010 0650   K 4.6 11/07/2013 0857   K 4.7 09/10/2010 0650   CL 107 04/27/2013 0926   CL 110 09/10/2010 0650   CO2 24 11/07/2013 0857   CO2 26 09/10/2010 0650   BUN 18.0 11/07/2013 0857   BUN 9 09/10/2010 0650   CREATININE 1.4*  11/07/2013 0857   CREATININE 1.09 09/10/2010 0650      Component Value Date/Time   CALCIUM 10.2 11/07/2013 0857   CALCIUM 8.9 09/10/2010 0650   ALKPHOS 67 11/07/2013 0857   ALKPHOS 49 09/10/2010 0650   AST 21 11/07/2013 0857   AST 18 09/10/2010 0650   ALT 21 11/07/2013 0857   ALT 20 09/10/2010 0650   BILITOT 0.52 11/07/2013 0857   BILITOT 0.7 09/10/2010 0650      ASSESSMENT & PLAN:  #1 CML, in remission The patient is tolerating current dosage of chemotherapy well and has achieved a molecular remission. We will continue on this indefinitely. I will go ahead and repeat blood work with CBC with differential, complete metabolic panel,  as well as BCR/ABL for further assessment. He needs to be seen every 3 months with history, physical examination, and blood work. #2 diabetes with peripheral neuropathy Continue current management per primary care physician #3 hypertension His blood pressure is mildly elevated. I recommend he contact primary care provider for medication adjustment   Orders Placed This Encounter  Procedures  . CBC with Differential    Standing Status: Future     Number of Occurrences:      Standing Expiration Date: 02/20/2015  . Comprehensive metabolic panel    Standing Status: Future     Number of Occurrences:      Standing Expiration Date: 02/20/2015  . Lactate dehydrogenase    Standing Status: Future     Number of Occurrences:      Standing Expiration Date: 02/20/2015  . BCR-ABL    With RT-PCR technique    Standing Status: Future     Number of Occurrences:      Standing Expiration Date: 02/20/2015   All questions were answered. The patient knows to call the clinic with any problems, questions or concerns. No barriers to learning was detected. I spent 15 minutes counseling the patient face to face. The total time spent in the appointment was 20 minutes and more than 50% was on counseling and review of test results     [facility], [place], MD 02/20/2014 9:40 AM

## 2014-03-22 ENCOUNTER — Telehealth: Payer: Self-pay | Admitting: *Deleted

## 2014-03-22 NOTE — Telephone Encounter (Signed)
Received fax from [facility] was shipped to patient on 03/26/14

## 2014-04-17 ENCOUNTER — Encounter: Payer: Self-pay | Admitting: *Deleted

## 2014-04-17 NOTE — Progress Notes (Signed)
RECEIVED A FAX FROM [facility] CONCERNING A CONFIRMATION OF PRESCRIPTION SHIPMENT FOR [place] ON 04/19/14.

## 2014-05-23 ENCOUNTER — Other Ambulatory Visit: Payer: Self-pay | Admitting: *Deleted

## 2014-05-23 ENCOUNTER — Encounter: Payer: Self-pay | Admitting: *Deleted

## 2014-05-23 DIAGNOSIS — R200 Anesthesia of skin: Secondary | ICD-10-CM

## 2014-05-23 NOTE — Progress Notes (Signed)
Fax received from [facility] they shipped [facility] for delivery to pt on 7/09.

## 2014-05-29 ENCOUNTER — Encounter (HOSPITAL_COMMUNITY): Payer: Self-pay | Admitting: Emergency Medicine

## 2014-05-29 ENCOUNTER — Telehealth: Payer: Self-pay | Admitting: *Deleted

## 2014-05-29 ENCOUNTER — Encounter: Payer: Self-pay | Admitting: Hematology and Oncology

## 2014-05-29 ENCOUNTER — Other Ambulatory Visit (HOSPITAL_BASED_OUTPATIENT_CLINIC_OR_DEPARTMENT_OTHER): Payer: Medicare Other

## 2014-05-29 ENCOUNTER — Emergency Department (HOSPITAL_COMMUNITY)
Admission: EM | Admit: 2014-05-29 | Discharge: 2014-05-29 | Disposition: A | Discharge status: Home / Self Care | Payer: Medicare Other | Attending: Emergency Medicine | Admitting: Emergency Medicine

## 2014-05-29 ENCOUNTER — Ambulatory Visit (HOSPITAL_BASED_OUTPATIENT_CLINIC_OR_DEPARTMENT_OTHER): Payer: Medicare Other | Admitting: Hematology and Oncology

## 2014-05-29 ENCOUNTER — Emergency Department (HOSPITAL_COMMUNITY): Payer: Medicare Other

## 2014-05-29 VITALS — BP 136/80 | HR 86 | Temp 97.6°F | Resp 18 | Ht 74.0 in | Wt 249.9 lb

## 2014-05-29 DIAGNOSIS — R1012 Left upper quadrant pain: Secondary | ICD-10-CM | POA: Diagnosis not present

## 2014-05-29 DIAGNOSIS — Z794 Long term (current) use of insulin: Secondary | ICD-10-CM | POA: Insufficient documentation

## 2014-05-29 DIAGNOSIS — E1149 Type 2 diabetes mellitus with other diabetic neurological complication: Secondary | ICD-10-CM | POA: Insufficient documentation

## 2014-05-29 DIAGNOSIS — I10 Essential (primary) hypertension: Secondary | ICD-10-CM | POA: Insufficient documentation

## 2014-05-29 DIAGNOSIS — Z862 Personal history of diseases of the blood and blood-forming organs and certain disorders involving the immune mechanism: Secondary | ICD-10-CM | POA: Diagnosis not present

## 2014-05-29 DIAGNOSIS — Z87891 Personal history of nicotine dependence: Secondary | ICD-10-CM | POA: Insufficient documentation

## 2014-05-29 DIAGNOSIS — E1140 Type 2 diabetes mellitus with diabetic neuropathy, unspecified: Secondary | ICD-10-CM

## 2014-05-29 DIAGNOSIS — Z8739 Personal history of other diseases of the musculoskeletal system and connective tissue: Secondary | ICD-10-CM | POA: Diagnosis not present

## 2014-05-29 DIAGNOSIS — Z856 Personal history of leukemia: Secondary | ICD-10-CM | POA: Diagnosis not present

## 2014-05-29 DIAGNOSIS — Z79899 Other long term (current) drug therapy: Secondary | ICD-10-CM | POA: Diagnosis not present

## 2014-05-29 DIAGNOSIS — F411 Generalized anxiety disorder: Secondary | ICD-10-CM | POA: Insufficient documentation

## 2014-05-29 DIAGNOSIS — E1142 Type 2 diabetes mellitus with diabetic polyneuropathy: Secondary | ICD-10-CM

## 2014-05-29 DIAGNOSIS — R748 Abnormal levels of other serum enzymes: Secondary | ICD-10-CM

## 2014-05-29 DIAGNOSIS — E7800 Pure hypercholesterolemia, unspecified: Secondary | ICD-10-CM | POA: Insufficient documentation

## 2014-05-29 DIAGNOSIS — Z7982 Long term (current) use of aspirin: Secondary | ICD-10-CM | POA: Diagnosis not present

## 2014-05-29 DIAGNOSIS — G8929 Other chronic pain: Secondary | ICD-10-CM | POA: Diagnosis not present

## 2014-05-29 DIAGNOSIS — C9211 Chronic myeloid leukemia, BCR/ABL-positive, in remission: Secondary | ICD-10-CM

## 2014-05-29 DIAGNOSIS — R7989 Other specified abnormal findings of blood chemistry: Secondary | ICD-10-CM | POA: Diagnosis present

## 2014-05-29 DIAGNOSIS — Z8719 Personal history of other diseases of the digestive system: Secondary | ICD-10-CM | POA: Insufficient documentation

## 2014-05-29 DIAGNOSIS — R945 Abnormal results of liver function studies: Secondary | ICD-10-CM

## 2014-05-29 HISTORY — DX: Other chronic pancreatitis: K86.1

## 2014-05-29 LAB — COMPREHENSIVE METABOLIC PANEL (CC13)
ANION GAP: 11 meq/L (ref 3–11)
AST: 265 U/L (ref 5–34)
Albumin: 3.8 g/dL (ref 3.5–5.0)
Alkaline Phosphatase: 269 U/L — ABNORMAL HIGH (ref 40–150)
BILIRUBIN TOTAL: 1.25 mg/dL — AB (ref 0.20–1.20)
BUN: 15.6 mg/dL (ref 7.0–26.0)
CHLORIDE: 106 meq/L (ref 98–109)
CO2: 23 meq/L (ref 22–29)
CREATININE: 1.5 mg/dL — AB (ref 0.7–1.3)
Calcium: 10.5 mg/dL — ABNORMAL HIGH (ref 8.4–10.4)
GLUCOSE: 294 mg/dL — AB (ref 70–140)
Potassium: 4.4 mEq/L (ref 3.5–5.1)
Sodium: 140 mEq/L (ref 136–145)
Total Protein: 7.2 g/dL (ref 6.4–8.3)

## 2014-05-29 LAB — HEPATIC FUNCTION PANEL
ALBUMIN: 3.8 g/dL (ref 3.5–5.2)
ALT: 342 U/L — ABNORMAL HIGH (ref 0–53)
AST: 201 U/L — AB (ref 0–37)
Alkaline Phosphatase: 284 U/L — ABNORMAL HIGH (ref 39–117)
Bilirubin, Direct: 0.5 mg/dL — ABNORMAL HIGH (ref 0.0–0.3)
Indirect Bilirubin: 0.6 mg/dL (ref 0.3–0.9)
TOTAL PROTEIN: 7.2 g/dL (ref 6.0–8.3)
Total Bilirubin: 1.1 mg/dL (ref 0.3–1.2)

## 2014-05-29 LAB — CBC WITH DIFFERENTIAL/PLATELET
BASO%: 0.5 % (ref 0.0–2.0)
BASOS ABS: 0 10*3/uL (ref 0.0–0.1)
EOS ABS: 0.1 10*3/uL (ref 0.0–0.5)
EOS%: 2 % (ref 0.0–7.0)
HEMATOCRIT: 40.7 % (ref 38.4–49.9)
HEMOGLOBIN: 13.8 g/dL (ref 13.0–17.1)
LYMPH#: 1.4 10*3/uL (ref 0.9–3.3)
LYMPH%: 25.4 % (ref 14.0–49.0)
MCH: 33.1 pg (ref 27.2–33.4)
MCHC: 33.9 g/dL (ref 32.0–36.0)
MCV: 97.6 fL (ref 79.3–98.0)
MONO#: 0.5 10*3/uL (ref 0.1–0.9)
MONO%: 8.9 % (ref 0.0–14.0)
NEUT%: 63.2 % (ref 39.0–75.0)
NEUTROS ABS: 3.5 10*3/uL (ref 1.5–6.5)
PLATELETS: 150 10*3/uL (ref 140–400)
RBC: 4.17 10*6/uL — ABNORMAL LOW (ref 4.20–5.82)
RDW: 14.7 % — ABNORMAL HIGH (ref 11.0–14.6)
WBC: 5.6 10*3/uL (ref 4.0–10.3)

## 2014-05-29 LAB — LIPASE, BLOOD: Lipase: 185 U/L — ABNORMAL HIGH (ref 11–59)

## 2014-05-29 LAB — HEPATITIS PANEL, ACUTE
HCV Ab: NEGATIVE
Hep A IgM: NONREACTIVE
Hep B C IgM: NONREACTIVE
Hepatitis B Surface Ag: NEGATIVE

## 2014-05-29 LAB — LACTATE DEHYDROGENASE (CC13): LDH: 261 U/L — AB (ref 125–245)

## 2014-05-29 NOTE — Assessment & Plan Note (Signed)
The cause is unknown. At the time of dictation, the patient has left the office and subsequent liver function test came back very abnormal. My nurse is instructed to direct him to the emergency department for urgent workup.

## 2014-05-29 NOTE — Progress Notes (Signed)
[place] OFFICE PROGRESS NOTE  Patient Care Team: [name], MD as PCP - General (Internal Medicine) [name], MD as Referring Physician (Specialist) Provider Not In System [name], MD as Consulting Physician (Hematology and Oncology)  SUMMARY OF ONCOLOGIC HISTORY: He was found to have significant and progressive leukocytosis last year and was subsequently referred here for further evaluation. On 08/02/2012, bone marrow aspirate and biopsy show hypocellular marrow with 3% blasts, consistent with chronic phase CML. A followup bone marrow aspirate and biopsy in December 2013 showed that his bone marrow was in normal morphology. His peripheral blood PCR able has improved significantly at 3 months. His last BCR able from June 2014 showed that he is in complete remission with major molecular response   INTERVAL HISTORY: Please see below for problem oriented charting. He denies recent infection. He still had persistent peripheral neuropathy due to poorly controlled diabetes. He has been compliant taking medication. Denies any skin rash or shortness of breath.  REVIEW OF SYSTEMS:   Constitutional: Denies fevers, chills or abnormal weight loss Eyes: Denies blurriness of vision Ears, nose, mouth, throat, and face: Denies mucositis or sore throat Respiratory: Denies cough, dyspnea or wheezes Cardiovascular: Denies palpitation, chest discomfort or lower extremity swelling Gastrointestinal:  Denies nausea, heartburn or change in bowel habits Skin: Denies abnormal skin rashes Lymphatics: Denies new lymphadenopathy or easy bruising Neurological:Denies numbness, tingling or new weaknesses Behavioral/Psych: Mood is stable, no new changes  All other systems were reviewed with the patient and are negative.  I have reviewed the past medical history, past surgical history, social history and family history with the patient and they are unchanged from previous  note.  ALLERGIES:  has No Known Allergies.  MEDICATIONS:  Current Outpatient Prescriptions  Medication Sig Dispense Refill  . aspirin EC 81 MG tablet Take 81 mg by mouth daily.      . B Complex-C (SUPER B COMPLEX/VITAMIN C PO) Take 12 tablets by mouth daily.       . dasatinib (SPRYCEL) 50 MG tablet Take 1 tablet (50 mg total) by mouth daily.  90 tablet  6  . fluocinonide cream (LIDEX) 5.00 % Apply 1 application topically 2 (two) times daily as needed (rash). As needed      . insulin glargine (LANTUS) 100 UNIT/ML injection Inject 35 Units into the skin at bedtime.       Marland Kitchen losartan (COZAAR) 100 MG tablet Take 100 mg by mouth daily.      . metFORMIN (GLUCOPHAGE) 1000 MG tablet Take 1,000 mg by mouth 2 (two) times daily with a meal.      . ondansetron (ZOFRAN) 8 MG tablet Take 1 tablet (8 mg total) by mouth every 8 (eight) hours as needed for nausea.  20 tablet  2  . saxagliptin HCl (ONGLYZA) 5 MG TABS tablet Take 5 mg by mouth daily.      . simvastatin (ZOCOR) 20 MG tablet Take 20 mg by mouth every evening.      . vitamin B-12 (CYANOCOBALAMIN) 1000 MCG tablet Take 1,000 mcg by mouth daily.      Marland Kitchen acetaminophen (TYLENOL) 325 MG tablet Take 650 mg by mouth every 6 (six) hours as needed for moderate pain.       No current facility-administered medications for this visit.    PHYSICAL EXAMINATION: ECOG PERFORMANCE STATUS: 0 - Asymptomatic  Filed Vitals:   05/29/14 0922  BP: 136/80  Pulse: 86  Temp: 97.6 F (36.4 C)  Resp:  18   Filed Weights   05/29/14 0922  Weight: 249 lb 14.4 oz (113.354 kg)    GENERAL:alert, no distress and comfortable. He is morbidly obese SKIN: skin color, texture, turgor are normal, no rashes or significant lesions EYES: normal, Conjunctiva are pink and non-injected, sclera clear OROPHARYNX:no exudate, no erythema and lips, buccal mucosa, and tongue normal  NECK: supple, thyroid normal size, non-tender, without nodularity LYMPH:  no palpable lymphadenopathy  in the cervical, axillary or inguinal LUNGS: clear to auscultation and percussion with normal breathing effort HEART: regular rate & rhythm and no murmurs and no lower extremity edema ABDOMEN:abdomen soft, non-tender and normal bowel sounds Musculoskeletal:no cyanosis of digits and no clubbing  NEURO: alert & oriented x 3 with fluent speech, no focal motor/sensory deficits  LABORATORY DATA:  I have reviewed the data as listed    Component Value Date/Time   NA 140 05/29/2014 0912   NA 141 09/10/2010 0650   K 4.4 05/29/2014 0912   K 4.7 09/10/2010 0650   CL 107 04/27/2013 0926   CL 110 09/10/2010 0650   CO2 23 05/29/2014 0912   CO2 26 09/10/2010 0650   GLUCOSE 294* 05/29/2014 0912   GLUCOSE 212* 04/27/2013 0926   GLUCOSE 166* 09/10/2010 0650   BUN 15.6 05/29/2014 0912   BUN 9 09/10/2010 0650   CREATININE 1.5* 05/29/2014 0912   CREATININE 1.09 09/10/2010 0650   CALCIUM 10.5* 05/29/2014 0912   CALCIUM 8.9 09/10/2010 0650   PROT 7.2 05/29/2014 1425   PROT 7.2 05/29/2014 0912   ALBUMIN 3.8 05/29/2014 1425   ALBUMIN 3.8 05/29/2014 0912   AST 201* 05/29/2014 1425   AST 265 Repeated and Verified* 05/29/2014 0912   ALT 342* 05/29/2014 1425   ALT 405 Repeated and Verified* 05/29/2014 0912   ALKPHOS 284* 05/29/2014 1425   ALKPHOS 269* 05/29/2014 0912   BILITOT 1.1 05/29/2014 1425   BILITOT 1.25* 05/29/2014 0912   GFRNONAA >60 09/10/2010 0650   GFRAA  Value: >60        The eGFR has been calculated using the MDRD equation. This calculation has not been validated in all clinical situations. eGFR's persistently <60 mL/min signify possible Chronic Kidney Disease. 09/10/2010 0650    No results found for this basename: SPEP,  UPEP,   kappa and lambda light chains    Lab Results  Component Value Date   WBC 5.6 05/29/2014   NEUTROABS 3.5 05/29/2014   HGB 13.8 05/29/2014   HCT 40.7 05/29/2014   MCV 97.6 05/29/2014   PLT 150 05/29/2014      Chemistry      Component Value Date/Time   NA 140 05/29/2014 0912    NA 141 09/10/2010 0650   K 4.4 05/29/2014 0912   K 4.7 09/10/2010 0650   CL 107 04/27/2013 0926   CL 110 09/10/2010 0650   CO2 23 05/29/2014 0912   CO2 26 09/10/2010 0650   BUN 15.6 05/29/2014 0912   BUN 9 09/10/2010 0650   CREATININE 1.5* 05/29/2014 0912   CREATININE 1.09 09/10/2010 0650      Component Value Date/Time   CALCIUM 10.5* 05/29/2014 0912   CALCIUM 8.9 09/10/2010 0650   ALKPHOS 284* 05/29/2014 1425   ALKPHOS 269* 05/29/2014 0912   AST 201* 05/29/2014 1425   AST 265 Repeated and Verified* 05/29/2014 0912   ALT 342* 05/29/2014 1425   ALT 405 Repeated and Verified* 05/29/2014 0912   BILITOT 1.1 05/29/2014 1425   BILITOT 1.25* 05/29/2014 0912  ASSESSMENT & PLAN:  CML in remission The patient has been in remission for the past year. I would lengthen his visit to every 6 months. His most recent BCR/ABL is still pending. At the time of dictation, his liver function tests are abnormal. The patient is instructed to hold his chemotherapy until further workup.  Diabetes mellitus with neuropathy He has poorly controlled diabetes. I recommend dietary modification.  Elevated liver enzymes The cause is unknown. At the time of dictation, the patient has left the office and subsequent liver function test came back very abnormal. My nurse is instructed to direct him to the emergency department for urgent workup.    Orders Placed This Encounter  Procedures  . Comprehensive metabolic panel    Standing Status: Future     Number of Occurrences:      Standing Expiration Date: 05/29/2015  . CBC with Differential    Standing Status: Future     Number of Occurrences:      Standing Expiration Date: 05/29/2015  . Lactate dehydrogenase    Standing Status: Future     Number of Occurrences:      Standing Expiration Date: 05/29/2015  . BCR-ABL    With RT-PCR technique    Standing Status: Future     Number of Occurrences:      Standing Expiration Date: 05/29/2015   All questions were  answered. The patient knows to call the clinic with any problems, questions or concerns. No barriers to learning was detected. I spent 25 minutes counseling the patient face to face. The total time spent in the appointment was 30 minutes and more than 50% was on counseling and review of test results     [facility], Caroll Weinheimer, MD 05/29/2014 8:20 PM

## 2014-05-29 NOTE — ED Provider Notes (Signed)
Pt received at sign out with [place] abd pending. [place] without new/acute abnormality. Pt states he continues to "feel just fine" and wants to go home now. VSS, A&O, resps easy, no N/V while in the ED. Pt to f/u with GI Dr. Paulita Fujita. Dx and testing d/w pt and family.  Questions answered.  Verb understanding, agreeable to d/c home with outpt f/u.     Alfonzo Feller, DO 05/29/14 1723

## 2014-05-29 NOTE — Telephone Encounter (Signed)
Informed wife of elevated LFTs and to take pt to ED today.  Hold Sprycel until we know what is wrong.  Wife states pt not at home right now but she will find him and take him to ED this afternoon.

## 2014-05-29 NOTE — Assessment & Plan Note (Signed)
He has poorly controlled diabetes. I recommend dietary modification.

## 2014-05-29 NOTE — Telephone Encounter (Signed)
Panic LFT levels reported by lab.  Dr. Alvy Bimler instructs for pt to go to ED for evaluation and hold Sprycel until we know what is wrong.  Left VM on pt's home and wife's cell phone number requesting pt or wife call nurse back as soon as they get message.

## 2014-05-29 NOTE — Assessment & Plan Note (Signed)
The patient has been in remission for the past year. I would lengthen his visit to every 6 months. His most recent BCR/ABL is still pending. At the time of dictation, his liver function tests are abnormal. The patient is instructed to hold his chemotherapy until further workup.

## 2014-05-29 NOTE — ED Provider Notes (Signed)
CSN: [id number]     Arrival date & time 05/29/14  1216 History   First MD Initiated Contact with Patient 05/29/14 1314     Chief Complaint  Patient presents with  . Abnormal Lab Value   . Abdominal Pain     (Consider location/radiation/quality/duration/timing/severity/associated sxs/prior Treatment) Patient is a 73 y.o. male presenting with abdominal pain. The history is provided by the patient.  Abdominal Pain Pain location:  LUQ Associated symptoms: no chest pain, no diarrhea, no nausea, no shortness of breath and no vomiting    patient has mild left upper quadrant pain. No nausea vomiting. No fevers. No diarrhea. He had a normal visit with his oncologist today. LFTs were done and were mildly elevated. He was sent in the ER. He was told this was life threatening. No fevers. He had a remote history of pancreatitis. He has a history of CML, and is currently in remission. Past Medical History  Diagnosis Date  . Chronic back pain greater than 3 months duration   . Bulging discs   . Diabetes mellitus     type II; neuropathy;   . Hypertension   . High cholesterol   . Leukocytosis   . GERD (gastroesophageal reflux disease)   . Pancreatitis 2004  . OSA (obstructive sleep apnea) 2010  . Anxiety   . DDD (degenerative disc disease) 2005    lumbar spine  . History of smoking 08/01/2012  . Weight loss 08/01/2012  . Cough 08/01/2012  . 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 Left leg pain 08/05/2012  . CML in remission 01/15/2014   Past Surgical History  Procedure Laterality Date  . Cholecystectomy    . Hernia repair    . Skin cancer removed  2012    right ear, basal cell carcinoma.   Marland Kitchen Spinal stretching    . Colonoscopy  2012    [facility]   Family History  Problem Relation Age of Onset  . Heart attack Mother   . Cancer Mother 79    lung  . Heart attack Father   . Stroke Father    History  Substance Use Topics  . Smoking status: Former Smoker -- 1.50 packs/day for 40 years    Quit date: 01/24/2004  . Smokeless tobacco: Never Used  . Alcohol Use: No    Review of Systems  Constitutional: Negative for activity change and appetite change.  Eyes: Negative for pain.  Respiratory: Negative for chest tightness and shortness of breath.   Cardiovascular: Negative for chest pain and leg swelling.  Gastrointestinal: Positive for abdominal pain. Negative for nausea, vomiting and diarrhea.  Genitourinary: Negative for flank pain.  Musculoskeletal: Negative for back pain and neck stiffness.  Skin: Negative for pallor and rash.  Neurological: Negative for weakness, numbness and headaches.  Psychiatric/Behavioral: Negative for behavioral problems.      Allergies  Review of patient's allergies indicates no known allergies.  Home Medications   Prior to Admission medications   Medication Sig Start Date End Date Taking? Authorizing Provider  acetaminophen (TYLENOL) 325 MG tablet Take 650 mg by mouth every 6 (six) hours as needed for moderate pain.   Yes Historical Provider, MD  aspirin EC 81 MG tablet Take 81 mg by mouth daily.   Yes Historical Provider, MD  B Complex-C (SUPER B COMPLEX/VITAMIN C PO) Take 12 tablets by mouth daily.    Yes Historical Provider, MD  dasatinib (SPRYCEL) 50 MG tablet Take 1 tablet (50 mg total) by mouth daily. 01/15/14  Yes [name], MD  fluocinonide cream (LIDEX) 1.44 % Apply 1 application topically 2 (two) times daily as needed (rash). As needed 10/11/12  Yes Historical Provider, MD  insulin glargine (LANTUS) 100 UNIT/ML injection Inject 35 Units into the skin at bedtime.    Yes [name], MD  losartan (COZAAR) 100 MG tablet Take 100 mg by mouth daily.   Yes Historical Provider, MD  metFORMIN (GLUCOPHAGE) 1000 MG tablet Take 1,000 mg by mouth 2 (two) times daily with a meal.   Yes Historical Provider, MD  ondansetron (ZOFRAN) 8 MG tablet Take 1 tablet (8 mg total) by mouth every 8 (eight) hours as needed for nausea. 09/05/12  Yes  Maryanna Shape, NP  saxagliptin HCl (ONGLYZA) 5 MG TABS tablet Take 5 mg by mouth daily.   Yes Historical Provider, MD  simvastatin (ZOCOR) 20 MG tablet Take 20 mg by mouth every evening.   Yes Historical Provider, MD  vitamin B-12 (CYANOCOBALAMIN) 1000 MCG tablet Take 1,000 mcg by mouth daily.   Yes Historical Provider, MD   BP 143/74  Pulse 75  Temp(Src) 98.4 F (36.9 C) (Oral)  Resp 18  SpO2 97% Physical Exam  Nursing note and vitals reviewed. Constitutional: He is oriented to person, place, and time. He appears well-developed and well-nourished.  HENT:  Head: Normocephalic and atraumatic.  Eyes: EOM are normal. Pupils are equal, round, and reactive to light.  Neck: Normal range of motion. Neck supple.  Cardiovascular: Normal rate, regular rhythm and normal heart sounds.   No murmur heard. Pulmonary/Chest: Effort normal and breath sounds normal.  Abdominal: Soft. Bowel sounds are normal. He exhibits no distension and no mass. There is tenderness. There is no rebound and no guarding.  Very mild left upper quadrant tenderness without rebound or guarding.   Musculoskeletal: Normal range of motion. He exhibits no edema.  Neurological: He is alert and oriented to person, place, and time. No cranial nerve deficit.  Skin: Skin is warm and dry.  Psychiatric: He has a normal mood and affect.    ED Course  Procedures (including critical care time) Labs Review Labs Reviewed  HEPATIC FUNCTION PANEL - Abnormal; Notable for the following:    AST 201 (*)    ALT 342 (*)    Alkaline Phosphatase 284 (*)    Bilirubin, Direct 0.5 (*)    All other components within normal limits  LIPASE, BLOOD - Abnormal; Notable for the following:    Lipase 185 (*)    All other components within normal limits  HEPATITIS PANEL, ACUTE    Imaging Review No results found.   EKG Interpretation None      MDM   Final diagnoses:  Elevated liver function tests    Patient with mild left-sided  abdominal pain. Well-appearing. Sent in for mildly elevated LFTs. Lipase is also mildly elevated. Will get ultrasound. I discussed with Dr. Paulita Fujita from [facility] gastroenterology. They will see him in followup.    Jasper Riling. Alvino Chapel, MD 05/29/14 [phone]

## 2014-05-29 NOTE — ED Notes (Addendum)
Pt presents after being notified by Oncologist's office of elevated LFTs.  C/o constipation and LUQ abdominal pain, only when he lies on his L side starting this morning.  Denies pain.  Hx of leukemia.

## 2014-05-29 NOTE — Discharge Instructions (Signed)
°Emergency Department Resource Guide °1) Find a Doctor and Pay Out of Pocket °Although you won't have to find out who is covered by your insurance plan, it is a good idea to ask around and get recommendations. You will then need to call the office and see if the doctor you have chosen will accept you as a new patient and what types of options they offer for patients who are self-pay. Some doctors offer discounts or will set up payment plans for their patients who do not have insurance, but you will need to ask so you aren't surprised when you get to your appointment. ° °2) Contact Your Local Health Department °Not all health departments have doctors that can see patients for sick visits, but many do, so it is worth a call to see if yours does. If you don't know where your local health department is, you can check in your phone book. The CDC also has a tool to help you locate your state's health department, and many state websites also have listings of all of their local health departments. ° °3) Find a Walk-in Clinic °If your illness is not likely to be very severe or complicated, you may want to try a walk in clinic. These are popping up all over the country in pharmacies, drugstores, and shopping centers. They're usually staffed by nurse practitioners or physician assistants that have been trained to treat common illnesses and complaints. They're usually fairly quick and inexpensive. However, if you have serious medical issues or chronic medical problems, these are probably not your best option. ° °No Primary Care Doctor: °- Call [facility] Connect at  832-8000 - they can help you locate a primary care doctor that  accepts your insurance, provides certain services, etc. °- Physician Referral Service- 1-800-533-3463 ° °Chronic Pain Problems: °Organization         Address  Phone   Notes  °[facility]  (336) 297-2271 Patients need to be referred by their primary care doctor.  ° °Medication  Assistance: °Organization         Address  Phone   Notes  °[place] [facility] [address] °[place], [place] [address] (336) 641-8030 --Must be a resident of [place] °-- Must have NO insurance coverage whatsoever (no Medicaid/ Medicare, etc.) °-- The pt. MUST have a primary care doctor that directs their care regularly and follows them in the community °  °[facility]  (866) 331-1348   °[employer]  (888) 892-1162   ° °Agencies that provide inexpensive medical care: °Organization         Address  Phone   Notes  °[facility]  (336) 832-8035   °[facility]    (336) 832-7272   °[facility] [address] °[place], [place] [address] (336) 832-4777   °[facility] [address], °[place] (336) 271-4999   °[facility]    (336) 373-0678   °[facility]    (336) 272-1050   °[facility] ° [address], [place] Phone:  (336) 832-4444, Fax:  (336) 832-4440 Hours of Operation:  9 am - 6 pm, M-F.  Also accepts Medicaid/Medicare and self-pay.  °[facility] ° [address],  Phone: (336) 832-3150, Fax: (336) [phone]. Hours of Operation:  8:30 am - 5:30 pm, M-F.  Also accepts Medicaid and self-pay.  °[facility] [place] [address], [place] Phone: (336) 878-6027   °[facility] [address], [place], [place] (336)723-1848, Ext. 123 Mondays & Thursdays: 7-9 AM.  First 15 patients are seen on a first come, first serve basis. °  ° °Medicaid-accepting [place] Providers: ° °Organization         Address  Phone   Notes  °[facility] [address], [place] (336) 641-2100 Also accepts self-pay patients.  °[facility] [address], [place] ° (336) 856-9996   °[facility] [address], [place]  (336) 288-8857   °[facility] [address] [place] [address], [place]  (336) 299-7000   °Veita Bland [address], [place]  ° (336) 373-1557 Only accepts [place] Access Medicaid patients after they have their name applied to their card.  ° °Self-Pay (no insurance) in [place]: ° °Organization         Address  Phone   Notes  °Sickle Cell Patients, [facility] [address], [place] (336) 832-1970   °[facility] [address], [place] (336) 832-4400   ° [facility] [place] ° [address], [place] (336) 992-4800   °[facility]/Dr. Osei-Bonsu ° [address] [place] [address], [place] or [address], [place] (336) [phone] Phone number for both [place] and [place] locations is the same.  °[facility] [address], [place] (336) 299-0000   °[facility] [address] [place] [address], [place] or [address] (336) 852-7530 °(336) 878-2260   °[facility] [address], [place] (336) 350-1642, phone; (336) 294-5005, fax Sees patients 1st and 3rd Saturday of every month.  Must not qualify for public or private insurance (i.e. Medicaid, Medicare, [place] Health Choice, Veterans' Benefits) • Household income should be no more than 200% of the poverty level •The clinic cannot treat you if you are pregnant or think you are pregnant • Sexually transmitted diseases are not treated at the clinic.  ° ° °Dental Care: °Organization         Address  Phone  Notes  °[place] [facility] [address], [place] (336) 641-6152 Accepts children up to age 21 who are enrolled in Medicaid or [place] Health Choice; pregnant women with a Medicaid card; and children who have applied for Medicaid or [place] Health Choice, but were declined, whose parents can pay a reduced fee at time of service.  °[place]  [facility] [place]  [address], [place] (336) 641-7733 Accepts children up to age 21 who are enrolled in Medicaid or [place] Health Choice; pregnant women with a Medicaid card; and children who have applied for Medicaid or [place] Health Choice, but were declined, whose parents can pay a reduced fee at time of service.  °[facility] ° [address], [place] (336) 641-4533 Patients are seen by appointment only. Walk-ins are not accepted. [facility] will see patients 18 years of age and older. °Monday - Tuesday (8am-5pm) °Most Wednesdays (8:30-5pm) °$30 per visit, cash only  °[facility] ° [address], [place] (336) 641-4533 Patients are seen by appointment only. Walk-ins are not accepted. [facility] will see patients 18 years of age and older. °One   Wednesday Evening (Monthly: Volunteer Based).  $30 per visit, cash only  °[facility]  (919) 537-3737 for adults; Children under age 4, call Graduate Pediatric Dentistry at (919) [phone]. Children aged 4-14, please call (919) [phone] to request a pediatric application. ° Dental services are provided in all areas of dental care including fillings, crowns and bridges, complete and partial dentures, implants, gum treatment, root canals, and extractions. Preventive care is also provided. Treatment is provided to both adults and children. °Patients are selected via a lottery and there is often a waiting list. °  °[facility] [address], °[place] ° (336) 763-8833 [email or web address].drcivils.[email or web address] °  °[facility] [address], [place], [place] (336)723-1848, Ext. 123 Second and Fourth Thursday of each month, opens at 6:30 AM; Clinic ends at 9 AM.  Patients are seen on a first-come first-served basis, and a limited number are seen during each clinic.  ° °[facility] ° [address], [place], [place] (336) [phone]    Eligibility Requirements °You must have lived in [place], [place], or [place] counties for at least the last three months. °  You cannot be eligible for state or federal sponsored healthcare insurance, including Veterans Administration, Medicaid, or Medicare. °  You generally cannot be eligible for healthcare insurance through your employer.  °  How to apply: °Eligibility screenings are held every Tuesday and Wednesday afternoon from 1:00 pm until 4:00 pm. You do not need an appointment for the interview!  °[facility] [address], [place], [place] 336-631-2330   °[facility]  336-342-8273   °[facility]  336-703-3100   °[facility]  336-570-6415   ° °Behavioral Health Resources in the Community: °Intensive Outpatient Programs °Organization         Address  Phone  Notes  °[facility] [address], [place], [place] 336-878-6098   °[facility] [address], [place], [place] 336-832-9800   °[facility] [address], [place], [place] ° 336-882-2125   °[facility] [address],  °[place], [place] 1-800-853-5163 or 336-641-4981   °Substance Abuse Resources °Organization         Address  Phone  Notes  °[facility]  336-882-2125   °[facility]  336-784-9470   °[facility] [place] [facility]  336-285-9073   °[facility]  336-845-3988   °Residential & Outpatient Substance Abuse Program  1-800-659-3381   °Psychological Services °Organization         Address  Phone  Notes  °[facility]  336- 832-9600   °[facility]  336- 378-7881   °[facility] [address], [place] 1-800-853-5163 or 336-641-4981   ° °Mobile Crisis Teams °Organization         Address  Phone  Notes  °[facility], Mobile Crisis Care Unit  1-877-626-1772   °Assertive °[facility] ° [address]  [place], [place] 336-834-9664   °Sharon DeEsch [address] °[place] 336-554-5454   ° °Self-Help/Support Groups °Organization         Address  Phone             Notes  °[facility] - variety of support groups  336- 373-1402 Call for more information  °Narcotics Anonymous (NA), [facility] [address], °[place]  2 meetings at this location  ° °  Residential Treatment Programs Organization         Address  Phone  Notes  [facility] [address],    [place]  1-(912) 290-1333   [facility]  [address], [place] [address], [place], [place]   [place], [place] [phone] Admissions: 8am-3pm M-F  [facility] [place] [address]    [place], [place] 262-035-5974   [facility] [address] [place], [place], [place]   [facility] [address]  [place], [place]   [facility] Intensive Outpatient [address], [place], [place]   [facility] ([place] [place].,  [place], [place] 1-(726)324-9208 or 838-322-5333   [facility] [address], [place], [place] Accepts Medicaid  [facility] [address],  [place] 1-506-638-3105 Substance Abuse/Addiction Treatment   [facility]         Address  Phone  Notes  [facility]  [phone]   Domenic Schwab, PhD [address] [place], [place]   [phone] or 4017657487   [place], [place] [phone]   [facility] [address], [place], [place] 414-869-1960 Insurance/Medicaid/sponsorship through [facility] and Families [address]                                    [place], [place] [phone]) [phone] [place] [address], [place] [phone]    Dr. Adele Schilder  [phone]   [facility] Dept. 1) [address]  2) [place] 3)  [place] [address], [place] ([phone]) [phone]  [phone]   [place] (726)480-1198 or 732-516-2000 (After Hours)       Take your usual prescriptions as previously directed.  Call the GI doctor tomorrow to schedule a follow up appointment within the next 3 days.  Return to the Emergency Department immediately sooner if worsening.

## 2014-05-31 ENCOUNTER — Telehealth: Payer: Self-pay | Admitting: Hematology and Oncology

## 2014-05-31 NOTE — Telephone Encounter (Signed)
S/w the pt's wife and she is aware of the jan appts

## 2014-06-01 ENCOUNTER — Other Ambulatory Visit: Payer: Self-pay | Admitting: Internal Medicine

## 2014-06-01 DIAGNOSIS — R945 Abnormal results of liver function studies: Principal | ICD-10-CM

## 2014-06-01 DIAGNOSIS — Z139 Encounter for screening, unspecified: Secondary | ICD-10-CM

## 2014-06-01 DIAGNOSIS — R7989 Other specified abnormal findings of blood chemistry: Secondary | ICD-10-CM

## 2014-06-04 ENCOUNTER — Ambulatory Visit
Admission: RE | Admit: 2014-06-04 | Discharge: 2014-06-04 | Disposition: A | Discharge status: Home / Self Care | Payer: Medicare Other | Source: Ambulatory Visit | Attending: Internal Medicine | Admitting: Internal Medicine

## 2014-06-04 ENCOUNTER — Encounter (HOSPITAL_COMMUNITY): Payer: Self-pay | Admitting: Emergency Medicine

## 2014-06-04 DIAGNOSIS — Z6832 Body mass index (BMI) 32.0-32.9, adult: Secondary | ICD-10-CM

## 2014-06-04 DIAGNOSIS — N183 Chronic kidney disease, stage 3 unspecified: Secondary | ICD-10-CM | POA: Diagnosis present

## 2014-06-04 DIAGNOSIS — K219 Gastro-esophageal reflux disease without esophagitis: Secondary | ICD-10-CM | POA: Diagnosis present

## 2014-06-04 DIAGNOSIS — R17 Unspecified jaundice: Secondary | ICD-10-CM | POA: Diagnosis not present

## 2014-06-04 DIAGNOSIS — G4733 Obstructive sleep apnea (adult) (pediatric): Secondary | ICD-10-CM | POA: Diagnosis present

## 2014-06-04 DIAGNOSIS — C250 Malignant neoplasm of head of pancreas: Secondary | ICD-10-CM | POA: Diagnosis not present

## 2014-06-04 DIAGNOSIS — C9211 Chronic myeloid leukemia, BCR/ABL-positive, in remission: Secondary | ICD-10-CM | POA: Diagnosis present

## 2014-06-04 DIAGNOSIS — E559 Vitamin D deficiency, unspecified: Secondary | ICD-10-CM | POA: Diagnosis present

## 2014-06-04 DIAGNOSIS — Z7982 Long term (current) use of aspirin: Secondary | ICD-10-CM

## 2014-06-04 DIAGNOSIS — E669 Obesity, unspecified: Secondary | ICD-10-CM | POA: Diagnosis present

## 2014-06-04 DIAGNOSIS — Z87891 Personal history of nicotine dependence: Secondary | ICD-10-CM

## 2014-06-04 DIAGNOSIS — Z79899 Other long term (current) drug therapy: Secondary | ICD-10-CM

## 2014-06-04 DIAGNOSIS — I129 Hypertensive chronic kidney disease with stage 1 through stage 4 chronic kidney disease, or unspecified chronic kidney disease: Secondary | ICD-10-CM | POA: Diagnosis present

## 2014-06-04 DIAGNOSIS — E1149 Type 2 diabetes mellitus with other diabetic neurological complication: Secondary | ICD-10-CM | POA: Diagnosis present

## 2014-06-04 DIAGNOSIS — E1142 Type 2 diabetes mellitus with diabetic polyneuropathy: Secondary | ICD-10-CM | POA: Diagnosis present

## 2014-06-04 DIAGNOSIS — Z8249 Family history of ischemic heart disease and other diseases of the circulatory system: Secondary | ICD-10-CM

## 2014-06-04 DIAGNOSIS — K7200 Acute and subacute hepatic failure without coma: Secondary | ICD-10-CM | POA: Diagnosis present

## 2014-06-04 DIAGNOSIS — Z823 Family history of stroke: Secondary | ICD-10-CM

## 2014-06-04 DIAGNOSIS — Z139 Encounter for screening, unspecified: Secondary | ICD-10-CM

## 2014-06-04 DIAGNOSIS — R21 Rash and other nonspecific skin eruption: Secondary | ICD-10-CM | POA: Diagnosis present

## 2014-06-04 DIAGNOSIS — G8929 Other chronic pain: Secondary | ICD-10-CM | POA: Diagnosis present

## 2014-06-04 DIAGNOSIS — E1129 Type 2 diabetes mellitus with other diabetic kidney complication: Secondary | ICD-10-CM | POA: Diagnosis present

## 2014-06-04 DIAGNOSIS — Z9089 Acquired absence of other organs: Secondary | ICD-10-CM

## 2014-06-04 DIAGNOSIS — Z794 Long term (current) use of insulin: Secondary | ICD-10-CM

## 2014-06-04 DIAGNOSIS — R918 Other nonspecific abnormal finding of lung field: Secondary | ICD-10-CM | POA: Diagnosis present

## 2014-06-04 NOTE — ED Notes (Signed)
Pt states that his PCP told him that when his eyes turn yellow to be checked out. Pt wife states that she saw yellow tinge to his eyes today. Pt states suppose to have testing done tomorrow.

## 2014-06-05 ENCOUNTER — Other Ambulatory Visit: Payer: Medicare Other

## 2014-06-05 ENCOUNTER — Emergency Department (HOSPITAL_COMMUNITY): Payer: Medicare Other

## 2014-06-05 ENCOUNTER — Inpatient Hospital Stay (HOSPITAL_COMMUNITY)
Admission: EM | Admit: 2014-06-05 | Discharge: 2014-06-08 | DRG: 435 | Disposition: A | Discharge status: Home / Self Care | Payer: Medicare Other | Attending: Internal Medicine | Admitting: Internal Medicine

## 2014-06-05 ENCOUNTER — Encounter (HOSPITAL_COMMUNITY): Payer: Self-pay

## 2014-06-05 ENCOUNTER — Inpatient Hospital Stay: Admission: RE | Admit: 2014-06-05 | Payer: Medicare Other | Source: Ambulatory Visit

## 2014-06-05 DIAGNOSIS — E1122 Type 2 diabetes mellitus with diabetic chronic kidney disease: Secondary | ICD-10-CM | POA: Diagnosis present

## 2014-06-05 DIAGNOSIS — Z79899 Other long term (current) drug therapy: Secondary | ICD-10-CM | POA: Diagnosis not present

## 2014-06-05 DIAGNOSIS — R918 Other nonspecific abnormal finding of lung field: Secondary | ICD-10-CM | POA: Diagnosis present

## 2014-06-05 DIAGNOSIS — E669 Obesity, unspecified: Secondary | ICD-10-CM | POA: Insufficient documentation

## 2014-06-05 DIAGNOSIS — C250 Malignant neoplasm of head of pancreas: Secondary | ICD-10-CM | POA: Diagnosis present

## 2014-06-05 DIAGNOSIS — E1142 Type 2 diabetes mellitus with diabetic polyneuropathy: Secondary | ICD-10-CM | POA: Diagnosis present

## 2014-06-05 DIAGNOSIS — Z823 Family history of stroke: Secondary | ICD-10-CM | POA: Diagnosis not present

## 2014-06-05 DIAGNOSIS — K869 Disease of pancreas, unspecified: Secondary | ICD-10-CM

## 2014-06-05 DIAGNOSIS — Z8249 Family history of ischemic heart disease and other diseases of the circulatory system: Secondary | ICD-10-CM | POA: Diagnosis not present

## 2014-06-05 DIAGNOSIS — R944 Abnormal results of kidney function studies: Secondary | ICD-10-CM

## 2014-06-05 DIAGNOSIS — E538 Deficiency of other specified B group vitamins: Secondary | ICD-10-CM | POA: Insufficient documentation

## 2014-06-05 DIAGNOSIS — N183 Chronic kidney disease, stage 3 unspecified: Secondary | ICD-10-CM | POA: Diagnosis present

## 2014-06-05 DIAGNOSIS — K831 Obstruction of bile duct: Secondary | ICD-10-CM

## 2014-06-05 DIAGNOSIS — Z9089 Acquired absence of other organs: Secondary | ICD-10-CM | POA: Diagnosis not present

## 2014-06-05 DIAGNOSIS — R21 Rash and other nonspecific skin eruption: Secondary | ICD-10-CM | POA: Diagnosis present

## 2014-06-05 DIAGNOSIS — K7200 Acute and subacute hepatic failure without coma: Secondary | ICD-10-CM

## 2014-06-05 DIAGNOSIS — R17 Unspecified jaundice: Secondary | ICD-10-CM

## 2014-06-05 DIAGNOSIS — I129 Hypertensive chronic kidney disease with stage 1 through stage 4 chronic kidney disease, or unspecified chronic kidney disease: Secondary | ICD-10-CM | POA: Diagnosis present

## 2014-06-05 DIAGNOSIS — Z87891 Personal history of nicotine dependence: Secondary | ICD-10-CM | POA: Diagnosis not present

## 2014-06-05 DIAGNOSIS — R7989 Other specified abnormal findings of blood chemistry: Secondary | ICD-10-CM

## 2014-06-05 DIAGNOSIS — G8929 Other chronic pain: Secondary | ICD-10-CM | POA: Diagnosis present

## 2014-06-05 DIAGNOSIS — R109 Unspecified abdominal pain: Secondary | ICD-10-CM

## 2014-06-05 DIAGNOSIS — E559 Vitamin D deficiency, unspecified: Secondary | ICD-10-CM | POA: Diagnosis present

## 2014-06-05 DIAGNOSIS — G4733 Obstructive sleep apnea (adult) (pediatric): Secondary | ICD-10-CM | POA: Diagnosis present

## 2014-06-05 DIAGNOSIS — D490 Neoplasm of unspecified behavior of digestive system: Secondary | ICD-10-CM

## 2014-06-05 DIAGNOSIS — Z7982 Long term (current) use of aspirin: Secondary | ICD-10-CM | POA: Diagnosis not present

## 2014-06-05 DIAGNOSIS — E1149 Type 2 diabetes mellitus with other diabetic neurological complication: Secondary | ICD-10-CM | POA: Diagnosis present

## 2014-06-05 DIAGNOSIS — K8689 Other specified diseases of pancreas: Secondary | ICD-10-CM | POA: Diagnosis present

## 2014-06-05 DIAGNOSIS — K219 Gastro-esophageal reflux disease without esophagitis: Secondary | ICD-10-CM | POA: Diagnosis present

## 2014-06-05 DIAGNOSIS — Z6832 Body mass index (BMI) 32.0-32.9, adult: Secondary | ICD-10-CM | POA: Diagnosis not present

## 2014-06-05 DIAGNOSIS — Z794 Long term (current) use of insulin: Secondary | ICD-10-CM | POA: Diagnosis not present

## 2014-06-05 DIAGNOSIS — E1129 Type 2 diabetes mellitus with other diabetic kidney complication: Secondary | ICD-10-CM | POA: Diagnosis present

## 2014-06-05 DIAGNOSIS — C9211 Chronic myeloid leukemia, BCR/ABL-positive, in remission: Secondary | ICD-10-CM | POA: Diagnosis present

## 2014-06-05 DIAGNOSIS — R945 Abnormal results of liver function studies: Secondary | ICD-10-CM

## 2014-06-05 DIAGNOSIS — I10 Essential (primary) hypertension: Secondary | ICD-10-CM | POA: Diagnosis present

## 2014-06-05 DIAGNOSIS — E1140 Type 2 diabetes mellitus with diabetic neuropathy, unspecified: Secondary | ICD-10-CM | POA: Diagnosis present

## 2014-06-05 DIAGNOSIS — C9111 Chronic lymphocytic leukemia of B-cell type in remission: Secondary | ICD-10-CM

## 2014-06-05 DIAGNOSIS — K838 Other specified diseases of biliary tract: Secondary | ICD-10-CM

## 2014-06-05 DIAGNOSIS — G63 Polyneuropathy in diseases classified elsewhere: Secondary | ICD-10-CM

## 2014-06-05 LAB — CBC WITH DIFFERENTIAL/PLATELET
Basophils Absolute: 0 10*3/uL (ref 0.0–0.1)
Basophils Relative: 0 % (ref 0–1)
Eosinophils Absolute: 0.2 10*3/uL (ref 0.0–0.7)
Eosinophils Relative: 3 % (ref 0–5)
HEMATOCRIT: 42 % (ref 39.0–52.0)
HEMOGLOBIN: 14.3 g/dL (ref 13.0–17.0)
LYMPHS ABS: 1.7 10*3/uL (ref 0.7–4.0)
LYMPHS PCT: 30 % (ref 12–46)
MCH: 33.3 pg (ref 26.0–34.0)
MCHC: 34 g/dL (ref 30.0–36.0)
MCV: 97.9 fL (ref 78.0–100.0)
MONO ABS: 0.7 10*3/uL (ref 0.1–1.0)
Monocytes Relative: 12 % (ref 3–12)
NEUTROS ABS: 3.2 10*3/uL (ref 1.7–7.7)
Neutrophils Relative %: 55 % (ref 43–77)
Platelets: 187 10*3/uL (ref 150–400)
RBC: 4.29 MIL/uL (ref 4.22–5.81)
RDW: 14.9 % (ref 11.5–15.5)
WBC: 5.7 10*3/uL (ref 4.0–10.5)

## 2014-06-05 LAB — COMPREHENSIVE METABOLIC PANEL
ALT: 313 U/L — AB (ref 0–53)
AST: 176 U/L — ABNORMAL HIGH (ref 0–37)
Albumin: 3.7 g/dL (ref 3.5–5.2)
Alkaline Phosphatase: 394 U/L — ABNORMAL HIGH (ref 39–117)
Anion gap: 12 (ref 5–15)
BILIRUBIN TOTAL: 5.5 mg/dL — AB (ref 0.3–1.2)
BUN: 18 mg/dL (ref 6–23)
CALCIUM: 10.6 mg/dL — AB (ref 8.4–10.5)
CHLORIDE: 98 meq/L (ref 96–112)
CO2: 27 meq/L (ref 19–32)
Creatinine, Ser: 1.47 mg/dL — ABNORMAL HIGH (ref 0.50–1.35)
GFR, EST AFRICAN AMERICAN: 54 mL/min — AB (ref >90)
GFR, EST NON AFRICAN AMERICAN: 46 mL/min — AB (ref >90)
GLUCOSE: 333 mg/dL — AB (ref 70–99)
Potassium: 4.5 mEq/L (ref 3.7–5.3)
Sodium: 137 mEq/L (ref 137–147)
Total Protein: 7.2 g/dL (ref 6.0–8.3)

## 2014-06-05 LAB — GLUCOSE, CAPILLARY
GLUCOSE-CAPILLARY: 209 mg/dL — AB (ref 70–99)
GLUCOSE-CAPILLARY: 293 mg/dL — AB (ref 70–99)
GLUCOSE-CAPILLARY: 235 mg/dL — AB (ref 70–99)

## 2014-06-05 LAB — PROTIME-INR
INR: 0.87 (ref 0.00–1.49)
PROTHROMBIN TIME: 11.8 s (ref 11.6–15.2)

## 2014-06-05 LAB — LIPASE, BLOOD: Lipase: 172 U/L — ABNORMAL HIGH (ref 11–59)

## 2014-06-05 MED ORDER — DASATINIB 50 MG PO TABS: 50.0000 mg | ORAL_TABLET | Freq: Every day | ORAL | Status: DC

## 2014-06-05 MED ORDER — MORPHINE SULFATE 2 MG/ML IJ SOLN
1.0000 mg | INTRAMUSCULAR | Status: DC | PRN
  Administered 2014-06-05: 1 mg via INTRAVENOUS
  Filled 2014-06-05: qty 1

## 2014-06-05 MED ORDER — BIOTENE DRY MOUTH MT LIQD
15.0000 mL | Freq: Two times a day (BID) | OROMUCOSAL | Status: DC
  Administered 2014-06-05 – 2014-06-07 (×5): 15 mL via OROMUCOSAL

## 2014-06-05 MED ORDER — ONDANSETRON HCL 4 MG/2ML IJ SOLN
4.0000 mg | Freq: Once | INTRAMUSCULAR | Status: AC
  Administered 2014-06-05: 4 mg via INTRAVENOUS
  Filled 2014-06-05: qty 2

## 2014-06-05 MED ORDER — GADOBENATE DIMEGLUMINE 529 MG/ML IV SOLN
20.0000 mL | Freq: Once | INTRAVENOUS | Status: AC
  Administered 2014-06-05: 20 mL via INTRAVENOUS

## 2014-06-05 MED ORDER — SODIUM CHLORIDE 0.9 % IV BOLUS (SEPSIS)
500.0000 mL | Freq: Once | INTRAVENOUS | Status: AC
  Administered 2014-06-05: 500 mL via INTRAVENOUS

## 2014-06-05 MED ORDER — ALBUTEROL SULFATE (2.5 MG/3ML) 0.083% IN NEBU: 2.5000 mg | INHALATION_SOLUTION | RESPIRATORY_TRACT | Status: DC | PRN

## 2014-06-05 MED ORDER — MORPHINE SULFATE 4 MG/ML IJ SOLN
2.0000 mg | Freq: Once | INTRAMUSCULAR | Status: AC
  Administered 2014-06-05: 2 mg via INTRAVENOUS
  Filled 2014-06-05: qty 1

## 2014-06-05 MED ORDER — VITAMIN B-12 1000 MCG PO TABS
1000.0000 ug | ORAL_TABLET | Freq: Every day | ORAL | Status: DC
  Administered 2014-06-05 – 2014-06-06 (×2): 1000 ug via ORAL
  Filled 2014-06-05 (×5): qty 1

## 2014-06-05 MED ORDER — ONDANSETRON HCL 4 MG PO TABS: 4.0000 mg | ORAL_TABLET | Freq: Four times a day (QID) | ORAL | Status: DC | PRN

## 2014-06-05 MED ORDER — INSULIN ASPART 100 UNIT/ML ~~LOC~~ SOLN
0.0000 [IU] | SUBCUTANEOUS | Status: DC
  Administered 2014-06-05: 8 [IU] via SUBCUTANEOUS
  Administered 2014-06-06: 2 [IU] via SUBCUTANEOUS
  Administered 2014-06-06: 3 [IU] via SUBCUTANEOUS
  Administered 2014-06-06: 5 [IU] via SUBCUTANEOUS
  Administered 2014-06-06: 11 [IU] via SUBCUTANEOUS
  Administered 2014-06-06 (×2): 3 [IU] via SUBCUTANEOUS
  Administered 2014-06-06: 8 [IU] via SUBCUTANEOUS
  Administered 2014-06-07: 2 [IU] via SUBCUTANEOUS
  Administered 2014-06-07 (×3): 3 [IU] via SUBCUTANEOUS
  Administered 2014-06-07 – 2014-06-08 (×2): 5 [IU] via SUBCUTANEOUS
  Administered 2014-06-08: 3 [IU] via SUBCUTANEOUS

## 2014-06-05 MED ORDER — INSULIN ASPART 100 UNIT/ML ~~LOC~~ SOLN: 0.0000 [IU] | Freq: Three times a day (TID) | SUBCUTANEOUS | Status: DC

## 2014-06-05 MED ORDER — INSULIN ASPART 100 UNIT/ML ~~LOC~~ SOLN: 0.0000 [IU] | SUBCUTANEOUS | Status: DC

## 2014-06-05 MED ORDER — INSULIN ASPART 100 UNIT/ML ~~LOC~~ SOLN
0.0000 [IU] | Freq: Three times a day (TID) | SUBCUTANEOUS | Status: DC
  Administered 2014-06-05: 3 [IU] via SUBCUTANEOUS

## 2014-06-05 MED ORDER — INSULIN GLARGINE 100 UNIT/ML ~~LOC~~ SOLN
18.0000 [IU] | Freq: Every day | SUBCUTANEOUS | Status: DC
  Administered 2014-06-05 – 2014-06-07 (×3): 18 [IU] via SUBCUTANEOUS
  Filled 2014-06-05 (×4): qty 0.18

## 2014-06-05 MED ORDER — SODIUM CHLORIDE 0.9 % IV SOLN
INTRAVENOUS | Status: DC
Start: 2014-06-05 — End: 2014-06-08
  Administered 2014-06-05: 1000 mL via INTRAVENOUS
  Administered 2014-06-07 – 2014-06-08 (×3): via INTRAVENOUS

## 2014-06-05 MED ORDER — ONDANSETRON HCL 4 MG/2ML IJ SOLN: 4.0000 mg | Freq: Four times a day (QID) | INTRAMUSCULAR | Status: DC | PRN

## 2014-06-05 NOTE — ED Notes (Signed)
MD at bedside. 

## 2014-06-05 NOTE — ED Notes (Signed)
Vitamin B-12 not administered due to not receiving it from pharmacy prior to care link arrival

## 2014-06-05 NOTE — Progress Notes (Signed)
Inpatient Diabetes Program Recommendations  AACE/ADA: New Consensus Statement on Inpatient Glycemic Control (2013)  Target Ranges:  Prepandial:   less than 140 mg/dL      Peak postprandial:   less than 180 mg/dL (1-2 hours)      Critically ill patients:  140 - 180 mg/dL   Reason for Visit: Hyperglycemia  Diabetes history: DM2 Outpatient Diabetes medications: Lantus 35 units QHS, metformin 1000 mg bid and onglyza 5 mg QAM Current orders for Inpatient glycemic control: Novolog sensitive tidwc  Inpatient Diabetes Program Recommendations Insulin - Basal: Add Lantus 18 units QHS (Home dose is 35 units QHS) Correction (SSI): Increase to Novolog moderate Q4H. When po intake and diet increase, tidwc and hs. Diet: When advanced, CHO mod med  Note: Will follow. Thank you. Lorenda Peck, RD, LDN, CDE Inpatient Diabetes Coordinator [phone]

## 2014-06-05 NOTE — Consult Note (Addendum)
Referring Provider: Dr. [place] Pink Primary Care Physician:  [name], MD Primary Gastroenterologist:  Dr. Wynetta Emery  Reason for Consultation:  Jaundice  HPI: Evan [name] is a 73 y.o. 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 Wife reports nausea without vomiting and complaints of upper quadrant pain. Elevated LFTs last week and was advised to go to ER when jaundice developed. Patient unable to give me any additional history. Son and wife present. Denies alcohol. CT shows pancreatic head mass with obstruction of the common bile duct and pancreatic duct concerning for pancreatic adenocarcinoma. No CT evidence of liver mets. Denies F/C.  LFTs as follows:   Results for KAION, TISDALE (MRN [id number]) as of 06/05/2014 13:00  Ref. Range 05/29/2014 14:25 06/05/2014 00:05  Alkaline Phosphatase Latest Range: 39-117 U/L 284 (H) 394 (H)  Albumin Latest Range: 3.5-5.2 g/dL 3.8 3.7  Lipase Latest Range: 11-59 U/L 185 (H) 172 (H)  AST Latest Range: 0-37 U/L 201 (H) 176 (H)  ALT Latest Range: 0-53 U/L 342 (H) 313 (H)  Total Protein Latest Range: 6.4-8.3 g/dL 7.2 7.2  Bilirubin, Direct Latest Range: 0.0-0.3 mg/dL 0.5 (H)   Indirect Bilirubin Latest Range: 0.3-0.9 mg/dL 0.6   Total Bilirubin Latest Range: 0.3-1.2 mg/dL 1.1 5.5 (H)    Past Medical History  Diagnosis Date  . Chronic back pain greater than 3 months duration   . Bulging discs   . Diabetes mellitus     type II; neuropathy;   . Hypertension   . High cholesterol   . Leukocytosis   . GERD (gastroesophageal reflux disease)   . Pancreatitis 2004  . OSA (obstructive sleep apnea) 2010  . Anxiety   . DDD (degenerative disc disease) 2005    lumbar spine  . History of smoking 08/01/2012  . Weight loss 08/01/2012  . Cough 08/01/2012  . 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 Left leg pain 08/05/2012  . 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 Chronic  pancreatitis     Past Surgical History  Procedure Laterality Date  . Cholecystectomy    . Hernia repair    . Skin cancer removed  2012    right ear, basal cell carcinoma.   Marland Kitchen Spinal stretching    . Colonoscopy  2012    [facility]    Prior to Admission medications   Medication Sig Start Date End Date Taking? Authorizing Provider  acetaminophen (TYLENOL) 325 MG tablet Take 650 mg by mouth every 6 (six) hours as needed for moderate pain.   Yes Historical Provider, MD  aspirin EC 81 MG tablet Take 81 mg by mouth daily.   Yes Historical Provider, MD  B Complex-C (SUPER B COMPLEX/VITAMIN C PO) Take 12 tablets by mouth daily.    Yes Historical Provider, MD  dasatinib (SPRYCEL) 50 MG tablet Take 1 tablet (50 mg total) by mouth daily. 01/15/14  Yes [name], MD  fluocinonide cream (LIDEX) 8.92 % Apply 1 application topically 2 (two) times daily as needed (rash). As needed 10/11/12  Yes Historical Provider, MD  insulin glargine (LANTUS) 100 UNIT/ML injection Inject 35 Units into the skin at bedtime.    Yes [name], MD  losartan (COZAAR) 100 MG tablet Take 100 mg by mouth daily.   Yes Historical Provider, MD  metFORMIN (GLUCOPHAGE) 1000 MG tablet Take 1,000 mg by mouth 2 (two) times daily with a meal.   Yes Historical Provider, MD  ondansetron (  ZOFRAN) 8 MG tablet Take 1 tablet (8 mg total) by mouth every 8 (eight) hours as needed for nausea. 09/05/12  Yes Maryanna Shape, NP  saxagliptin HCl (ONGLYZA) 5 MG TABS tablet Take 5 mg by mouth daily.   Yes Historical Provider, MD  simvastatin (ZOCOR) 20 MG tablet Take 20 mg by mouth every evening.   Yes Historical Provider, MD  vitamin B-12 (CYANOCOBALAMIN) 1000 MCG tablet Take 1,000 mcg by mouth daily.   Yes Historical Provider, MD    Scheduled Meds: . dasatinib  50 mg Oral QHS  . vitamin B-12  1,000 mcg Oral Daily   Continuous Infusions:  PRN Meds:.  Allergies as of 06/04/2014  . (No Known Allergies)    Family History  Problem  Relation Age of Onset  . Heart attack Mother   . Cancer Mother 85    lung  . Heart attack Father   . Stroke Father     History   Social History  . Marital Status: Married    Spouse Name: N/A    Number of Children: 2  . Years of Education: N/A   Occupational History  .      retired Programmer, [employer]   Social History Main Topics  . Smoking status: Former Smoker -- 1.50 packs/day for 40 years    Quit date: 01/24/2004  . Smokeless tobacco: Never Used  . Alcohol Use: No  . Drug Use: No  . Sexual Activity: Not on file   Other Topics Concern  . Not on file   Social History Narrative  . No narrative on file    Review of Systems: All negative from GI standpoint except as stated above in HPI.  Physical Exam: Vital signs: Filed Vitals:   06/05/14 1242  BP: 147/73  Pulse: 85  Temp: 98 F (36.7 C)  Resp: 20     General:  Awake, Well-developed, well-nourished, pleasant and cooperative in NAD, jaundice HEENT: +scleral icterus Neck: supple, nontender Lungs:  Clear throughout to auscultation.   No wheezes, crackles, or rhonchi. No acute distress. Heart:  Regular rate and rhythm; no murmurs, clicks, rubs,  or gallops. Abdomen: LLQ tenderness with guarding, guarding in epigastric and RUQ quadrant but denies tenderness in those quadrants, +distention, +BS  Rectal:  Deferred Ext: no edema Skin: jaundice Neuro: alert  GI:  Lab Results:  Recent Labs  06/05/14 0005  WBC 5.7  HGB 14.3  HCT 42.0  PLT 187   BMET  Recent Labs  06/05/14 0005  NA 137  K 4.5  CL 98  CO2 27  GLUCOSE 333*  BUN 18  CREATININE 1.47*  CALCIUM 10.6*   LFT  Recent Labs  06/05/14 0005  PROT 7.2  ALBUMIN 3.7  AST 176*  ALT 313*  ALKPHOS 394*  BILITOT 5.5*   PT/INR No results found for this basename: LABPROT, INR,  in the last 72 hours   Studies/Results: Dg Eye Foreign Body  06/04/2014   CLINICAL DATA:  Metal working/exposure; clearance prior to MRI  EXAM: ORBITS FOR FOREIGN  BODY - 2 VIEW  COMPARISON:  None.  FINDINGS: There is no evidence of metallic foreign body within the orbits. No significant bone abnormality identified.  IMPRESSION: No evidence of metallic foreign body within the orbits.   Electronically Signed   By: Rozetta Nunnery M.D.   On: 06/04/2014 11:28   Mr Abdomen W Wo Contrast  06/05/2014   CLINICAL DATA:  Jaundice, elevated LFTs  EXAM: MRI ABDOMEN WITHOUT AND  WITH CONTRAST  TECHNIQUE: Multiplanar multisequence MR imaging of the abdomen was performed both before and after the administration of intravenous contrast.  CONTRAST:  67mL MULTIHANCE GADOBENATE DIMEGLUMINE 529 MG/ML IV SOLN  COMPARISON:  Ultrasound 05/29/2014  FINDINGS: There is a moderate intrahepatic biliary duct dilatation and moderate to severe extrahepatic biliary ductal dilatation. The common bile duct and common hepatic duct measure up to 11 mm. There is abrupt tapering of the common bile duct as it enters the pancreatic head as seen on coronal series 2, image 11. Additionally there is dilatation of the pancreatic duct and atrophy of the tail. There is abrupt narrowing of the pancreatic duct as it enters the genu region of the pancreas as seen on coronal series 3, image 26. This so-called "double duct sign" is consistent with a obstructing pancreatic lesion. On the T1 weighted pre and post-contrast images there is a hypo intense lesion in the head of the pancreas at the level of the ductal obstruction measuring 2.2 x 2.2 cm in axial dimension (image 66 series 102). This is most concerning for a pancreatic adenocarcinoma.  This likely adenocarcinoma abuts the most proximal aspect of the main portal vein along the ventral surface for approximately 2 cm. The celiac trunk in its trifurcation vessels as well as the superior mesenteric artery are not involved by the pancreatic lesion. There is a cystic lesion the pancreas proximal to the ductal obstruction measuring 2.4 cm.  There is no significant periportal  peripancreatic lymphadenopathy. There is no focal enhancing hepatic lesion. The spleen the splenic vein appears normal. The left adrenal glands and is normal. There is a nodule measuring 11 mm extending from the right adrenal gland which demonstrates loss of signal intensity on the opposed phase imaging consistent with a small adrenal adenoma. Several nonenhancing cysts within the cortex of the right kidney.  No aggressive osseous lesion.  Lung bases appear clear.  IMPRESSION: 1. 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 2. Moderate intra and extrahepatic biliary duct dilatation. Recommend endoscopic ultrasound for tissue sampling as well as relief of the biliary obstruction. 3. Celiac trunk and trifurcation as well as the superior mesenteric artery are uninvolved by the pancreatic lesion. 4. Proximal main portal vein abuts the lesion over 2 cm. 5. No evidence of metastatic adenopathy. 6. No evidence of liver metastasis. Findings conveyed to Rolland Porter, MD on 06/05/2014  at09:06.   Electronically Signed   By: Suzy Bouchard M.D.   On: 06/05/2014 09:07    Impression/Plan: 73 yo with obstructive jaundice and evidence of a pancreatic mass on MRI concerning for pancreatic cancer. Needs an ERCP for stent placement due to the obstructive jaundice and an EUS for tissue sampling of the mass. TB and ALP rising and AST/ALT slightly lower. Elevated lipase so will give clear liquids only today. Will need EUS/ERCP this week and discussed risks/benefits and he agrees to proceed. EUS/ERCP tentatively will be scheduled for 06/07/14 (Dr. Paulita Fujita) but NPO p MN in case ERCP/EUS needed tomorrow.    LOS: 0 days   [place] C.  06/05/2014, 12:53 PM

## 2014-06-05 NOTE — Progress Notes (Signed)
Patient admitted to rm 1327, from [facility] ED, came via [place]. Alert and orientedx4. Patient oriented to environment/room. Pleasant. Kept comfortably in bed. _ Sandie Ano RN

## 2014-06-05 NOTE — ED Notes (Signed)
Attempted Report 

## 2014-06-05 NOTE — ED Provider Notes (Signed)
Pt left at change of shift to f/u of MRI of the abdomen.   08:58 Radiologist called MR results.   09:10 Discussed MR results with patient and his family. Agreeable for admission. Understand he may need to be transferred to [facility].   09:52 Dr Lyman Speller, admit to [facility], med-surg on [place] if possible, team 8   Dg Eye Foreign Body  06/04/2014   CLINICAL DATA:  Metal working/exposure; clearance prior to MRI  EXAM: ORBITS FOR FOREIGN BODY - 2 VIEW  COMPARISON:  None.  FINDINGS: There is no evidence of metallic foreign body within the orbits. No significant bone abnormality identified.  IMPRESSION: No evidence of metallic foreign body within the orbits.   Electronically Signed   By: Rozetta Nunnery M.D.   On: 06/04/2014 11:28   Mr Abdomen W Wo Contrast  06/05/2014   CLINICAL DATA:  Jaundice, elevated LFTs  EXAM: MRI ABDOMEN WITHOUT AND WITH CONTRAST  TECHNIQUE: Multiplanar multisequence MR imaging of the abdomen was performed both before and after the administration of intravenous contrast.  CONTRAST:  41mL MULTIHANCE GADOBENATE DIMEGLUMINE 529 MG/ML IV SOLN  COMPARISON:  Ultrasound 05/29/2014  FINDINGS: There is a moderate intrahepatic biliary duct dilatation and moderate to severe extrahepatic biliary ductal dilatation. The common bile duct and common hepatic duct measure up to 11 mm. There is abrupt tapering of the common bile duct as it enters the pancreatic head as seen on coronal series 2, image 11. Additionally there is dilatation of the pancreatic duct and atrophy of the tail. There is abrupt narrowing of the pancreatic duct as it enters the genu region of the pancreas as seen on coronal series 3, image 26. This so-called "double duct sign" is consistent with a obstructing pancreatic lesion. On the T1 weighted pre and post-contrast images there is a hypo intense lesion in the head of the pancreas at the level of the ductal obstruction measuring 2.2 x 2.2 cm in axial dimension (image 66 series 102). This is  most concerning for a pancreatic adenocarcinoma.  This likely adenocarcinoma abuts the most proximal aspect of the main portal vein along the ventral surface for approximately 2 cm. The celiac trunk in its trifurcation vessels as well as the superior mesenteric artery are not involved by the pancreatic lesion. There is a cystic lesion the pancreas proximal to the ductal obstruction measuring 2.4 cm.  There is no significant periportal peripancreatic lymphadenopathy. There is no focal enhancing hepatic lesion. The spleen the splenic vein appears normal. The left adrenal glands and is normal. There is a nodule measuring 11 mm extending from the right adrenal gland which demonstrates loss of signal intensity on the opposed phase imaging consistent with a small adrenal adenoma. Several nonenhancing cysts within the cortex of the right kidney.  No aggressive osseous lesion.  Lung bases appear clear.  IMPRESSION: 1. 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 2. Moderate intra and extrahepatic biliary duct dilatation. Recommend endoscopic ultrasound for tissue sampling as well as relief of the biliary obstruction. 3. Celiac trunk and trifurcation as well as the superior mesenteric artery are uninvolved by the pancreatic lesion. 4. Proximal main portal vein abuts the lesion over 2 cm. 5. No evidence of metastatic adenopathy. 6. No evidence of liver metastasis. Findings conveyed to Rolland Porter, MD on 06/05/2014  at09:06.   Electronically Signed   By: Suzy Bouchard M.D.   On:  06/05/2014 09:07    Diagnoses that have been ruled out:  None  Diagnoses that are still under consideration:  None  Final diagnoses:  Pancreatic mass  Abnormal liver function test      Plan  Transfer to [facility] for admission  Rolland Porter, MD, Alanson Aly, MD 06/05/14 [id number]

## 2014-06-05 NOTE — Consult Note (Signed)
[facility] Surgery Consult Note  Evan [name] 29-Nov-1940  [id number].    Requesting MD: Dr. [place] Pink Chief Complaint/Reason for Consult:   HPI:  73 y.o. 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 Wife reports nausea without vomiting, vague abdominal pain, lighter than normal stools, constipation, and itching. He had elevated LFTs last week at a normally scheduled oncology clinic appt.  He was called by the nurse at the cancer center and told to go to the ER for further evaluation of his elevated LFT's.  He was seen on 05/29/14 and an ultrasound was obtained.  It showed no acute/new findings and he was discharged to follow up with Dr. Paulita Fujita for OP workup.  His appt was supposed to be today with Dr. Paulita Fujita, but he was advised to go to ER when jaundice developed, thus his wife brought him to the hospital today (06/05/14).  He denied fever/chills, vomiting, CP/SOB, abdominal distension, myalgias.  No radicular symptoms, no precipitating or alleviating symptoms.  He does eat predominantly fried, greasy, high fat foods.  He was transferred to [facility] and admitted by the Hospitalist service.  GI has consulted and are recommending ERCP/EUS, a CA 19-9 is pending.  Son, daughter, and wife present help with the medical history, as he is very sedated after morphine administration.    CT shows pancreatic head mass with obstruction of the common bile duct and pancreatic duct concerning for pancreatic adenocarcinoma. No CT evidence of liver mets.    ROS: All systems reviewed and otherwise negative except for as above  Family History  Problem Relation Age of Onset  . Heart attack Mother   . Cancer Mother 81    lung  . Heart attack Father   . Stroke Father     Past Medical History  Diagnosis Date  . Chronic  back pain greater than 3 months duration   . Bulging discs   . Diabetes mellitus     type II; neuropathy;   . Hypertension   . High cholesterol   . Leukocytosis   . GERD (gastroesophageal reflux disease)   . Pancreatitis 2004  . OSA (obstructive sleep apnea) 2010  . Anxiety   . DDD (degenerative disc disease) 2005    lumbar spine  . History of smoking 08/01/2012  . Weight loss 08/01/2012  . Cough 08/01/2012  . 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 Left leg pain 08/05/2012  . 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 Chronic pancreatitis     Past Surgical History  Procedure Laterality Date  . Cholecystectomy    . Hernia repair    . Skin cancer removed  2012    right ear, basal cell carcinoma.   Marland Kitchen Spinal stretching    . Colonoscopy  2012    [facility]    Social History:  reports that he quit smoking about 10 years ago. He has never used smokeless tobacco. He reports that he does not drink alcohol or use illicit drugs.  Allergies: No Known Allergies  Medications Prior to Admission  Medication Sig Dispense Refill  . acetaminophen (TYLENOL) 325 MG tablet Take 650 mg by mouth every 6 (six) hours as needed for moderate pain.      Marland Kitchen aspirin EC 81 MG tablet Take 81 mg by mouth daily.      . B Complex-C (SUPER B COMPLEX/VITAMIN C PO)  Take 12 tablets by mouth daily.       . dasatinib (SPRYCEL) 50 MG tablet Take 1 tablet (50 mg total) by mouth daily.  90 tablet  6  . fluocinonide cream (LIDEX) 3.79 % Apply 1 application topically 2 (two) times daily as needed (rash). As needed      . insulin glargine (LANTUS) 100 UNIT/ML injection Inject 35 Units into the skin at bedtime.       Marland Kitchen losartan (COZAAR) 100 MG tablet Take 100 mg by mouth daily.      . metFORMIN (GLUCOPHAGE) 1000 MG tablet Take 1,000 mg by mouth 2 (two) times daily with a meal.      . ondansetron (ZOFRAN) 8 MG tablet Take 1 tablet (8 mg total) by mouth every 8 (eight) hours as needed for nausea.  20 tablet  2  . saxagliptin HCl  (ONGLYZA) 5 MG TABS tablet Take 5 mg by mouth daily.      . simvastatin (ZOCOR) 20 MG tablet Take 20 mg by mouth every evening.      . vitamin B-12 (CYANOCOBALAMIN) 1000 MCG tablet Take 1,000 mcg by mouth daily.        Blood pressure 150/77, pulse 74, temperature 97.8 F (36.6 C), temperature source Oral, resp. rate 16, height _0  (1.88 m), weight 250 lb (113.399 kg), SpO2 96.00%. Physical Exam: General: pleasant, WD/WN white male who is laying in bed in NAD HEENT: head is normocephalic, atraumatic.  Sclera are noninjected, but b/l icterus noted.  PERRL.  Ears and nose without any masses or lesions.  Mouth is pink and moist, palate icteric. Heart: regular, rate, and rhythm.  No obvious murmurs, gallops, or rubs noted.  Palpable pedal pulses bilaterally Lungs: CTAB, no wheezes, rhonchi, or rales noted.  Respiratory effort nonlabored Abd: obese, soft, ND, tender in the LLQ to palpation otherwise painess, +BS, no masses, hernias, or organomegaly, well healed surgical scars periumbilical from hernia repair and laparoscopic scars from cholecystectomy MS: all 4 extremities are symmetrical with no cyanosis, clubbing, or edema. Skin: warm and dry with no masses, lesions, but central body icterus noted Psych: quite sedated, slow to answer, slurred speech, a bit confused, not able to answer all questions   Results for orders placed during the hospital encounter of 06/05/14 (from the past 48 hour(s))  CBC WITH DIFFERENTIAL     Status: None   Collection Time    06/05/14 12:05 AM      Result Value Ref Range   WBC 5.7  4.0 - 10.5 K/uL   RBC 4.29  4.22 - 5.81 MIL/uL   Hemoglobin 14.3  13.0 - 17.0 g/dL   HCT 42.0  39.0 - 52.0 %   MCV 97.9  78.0 - 100.0 fL   MCH 33.3  26.0 - 34.0 pg   MCHC 34.0  30.0 - 36.0 g/dL   RDW 14.9  11.5 - 15.5 %   Platelets 187  150 - 400 K/uL   Neutrophils Relative % 55  43 - 77 %   Neutro Abs 3.2  1.7 - 7.7 K/uL   Lymphocytes Relative 30  12 - 46 %   Lymphs Abs 1.7   0.7 - 4.0 K/uL   Monocytes Relative 12  3 - 12 %   Monocytes Absolute 0.7  0.1 - 1.0 K/uL   Eosinophils Relative 3  0 - 5 %   Eosinophils Absolute 0.2  0.0 - 0.7 K/uL   Basophils Relative 0  0 - 1 %  Basophils Absolute 0.0  0.0 - 0.1 K/uL  COMPREHENSIVE METABOLIC PANEL     Status: Abnormal   Collection Time    06/05/14 12:05 AM      Result Value Ref Range   Sodium 137  137 - 147 mEq/L   Potassium 4.5  3.7 - 5.3 mEq/L   Chloride 98  96 - 112 mEq/L   CO2 27  19 - 32 mEq/L   Glucose, Bld 333 (*) 70 - 99 mg/dL   BUN 18  6 - 23 mg/dL   Creatinine, Ser 1.47 (*) 0.50 - 1.35 mg/dL   Calcium 10.6 (*) 8.4 - 10.5 mg/dL   Total Protein 7.2  6.0 - 8.3 g/dL   Albumin 3.7  3.5 - 5.2 g/dL   AST 176 (*) 0 - 37 U/L   ALT 313 (*) 0 - 53 U/L   Alkaline Phosphatase 394 (*) 39 - 117 U/L   Total Bilirubin 5.5 (*) 0.3 - 1.2 mg/dL   GFR calc non Af Amer 46 (*) >90 mL/min   GFR calc Af Amer 54 (*) >90 mL/min   Comment: (NOTE)     The eGFR has been calculated using the CKD EPI equation.     This calculation has not been validated in all clinical situations.     eGFR's persistently <90 mL/min signify possible Chronic Kidney     Disease.   Anion gap 12  5 - 15  LIPASE, BLOOD     Status: Abnormal   Collection Time    06/05/14 12:05 AM      Result Value Ref Range   Lipase 172 (*) 11 - 59 U/L  GLUCOSE, CAPILLARY     Status: Abnormal   Collection Time    06/05/14  1:38 PM      Result Value Ref Range   Glucose-Capillary 209 (*) 70 - 99 mg/dL   Comment 1 Documented in Chart     Comment 2 Notify RN    PROTIME-INR     Status: None   Collection Time    06/05/14  1:55 PM      Result Value Ref Range   Prothrombin Time 11.8  11.6 - 15.2 seconds   INR 0.87  0.00 - 1.49   Dg Eye Foreign Body  06/04/2014   CLINICAL DATA:  Metal working/exposure; clearance prior to MRI  EXAM: ORBITS FOR FOREIGN BODY - 2 VIEW  COMPARISON:  None.  FINDINGS: There is no evidence of metallic foreign body within the orbits. No  significant bone abnormality identified.  IMPRESSION: No evidence of metallic foreign body within the orbits.   Electronically Signed   By: Rozetta Nunnery M.D.   On: 06/04/2014 11:28   Mr Abdomen W Wo Contrast  06/05/2014   CLINICAL DATA:  Jaundice, elevated LFTs  EXAM: MRI ABDOMEN WITHOUT AND WITH CONTRAST  TECHNIQUE: Multiplanar multisequence MR imaging of the abdomen was performed both before and after the administration of intravenous contrast.  CONTRAST:  71m MULTIHANCE GADOBENATE DIMEGLUMINE 529 MG/ML IV SOLN  COMPARISON:  Ultrasound 05/29/2014  FINDINGS: There is a moderate intrahepatic biliary duct dilatation and moderate to severe extrahepatic biliary ductal dilatation. The common bile duct and common hepatic duct measure up to 11 mm. There is abrupt tapering of the common bile duct as it enters the pancreatic head as seen on coronal series 2, image 11. Additionally there is dilatation of the pancreatic duct and atrophy of the tail. There is abrupt narrowing of the pancreatic duct as  it enters the genu region of the pancreas as seen on coronal series 3, image 26. This so-called "double duct sign" is consistent with a obstructing pancreatic lesion. On the T1 weighted pre and post-contrast images there is a hypo intense lesion in the head of the pancreas at the level of the ductal obstruction measuring 2.2 x 2.2 cm in axial dimension (image 66 series 102). This is most concerning for a pancreatic adenocarcinoma.  This likely adenocarcinoma abuts the most proximal aspect of the main portal vein along the ventral surface for approximately 2 cm. The celiac trunk in its trifurcation vessels as well as the superior mesenteric artery are not involved by the pancreatic lesion. There is a cystic lesion the pancreas proximal to the ductal obstruction measuring 2.4 cm.  There is no significant periportal peripancreatic lymphadenopathy. There is no focal enhancing hepatic lesion. The spleen the splenic vein appears  normal. The left adrenal glands and is normal. There is a nodule measuring 11 mm extending from the right adrenal gland which demonstrates loss of signal intensity on the opposed phase imaging consistent with a small adrenal adenoma. Several nonenhancing cysts within the cortex of the right kidney.  No aggressive osseous lesion.  Lung bases appear clear.  IMPRESSION: 1. 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 2. Moderate intra and extrahepatic biliary duct dilatation. Recommend endoscopic ultrasound for tissue sampling as well as relief of the biliary obstruction. 3. Celiac trunk and trifurcation as well as the superior mesenteric artery are uninvolved by the pancreatic lesion. 4. Proximal main portal vein abuts the lesion over 2 cm. 5. No evidence of metastatic adenopathy. 6. No evidence of liver metastasis. Findings conveyed to Rolland Porter, MD on 06/05/2014  at09:06.   Electronically Signed   By: Suzy Bouchard M.D.   On: 06/05/2014 09:07      Assessment/Plan 2.2cm pancreatic head mass - concerning for adenocarcinoma CBD and pancreatic duct obstruction Painless obstructive jaundice Transaminitis Hyperbilirubinemia Elevated alk phos  Plan: 1.  Continue current medical workup including ERCP, EUS, CA 19-9 in process 2.  Dr. Barry Dienes our Hepatobiliary/Onc surgeon will see and evaluate and discuss plans for OR against surgery once the workup is finalized 3.  Clears until stent can be placed, continue to monitor labs daily 4.  Surgery would not be this admission, would hope to resolve his obstruction with a stent and arrange f/u as an outpatient with Dr. Doreene Adas, [place], [facility] Surgery 06/05/2014, 2:58 PM Pager: [phone]

## 2014-06-05 NOTE — Consult Note (Signed)
[facility] CONSULT NOTE  Patient Care Team: [name], MD as PCP - General (Internal Medicine) [name], MD as Referring Physician (Specialist) Provider Not In System [name], MD as Consulting Physician (Hematology and Oncology)  CHIEF COMPLAINTS/PURPOSE OF CONSULTATION:  Pancreatic mass with obstructive jaundice, worrisome for pancreatic cancer  HISTORY OF PRESENTING ILLNESS:  Evan [name] 73 y.o. male is here because of obstructive jaundice with pancreatic mass. I saw the patient recently. 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 On 08/02/2012, bone marrow aspirate and biopsy show hypocellular marrow with 3% blasts, consistent with chronic phase CML. A followup bone marrow aspirate and biopsy in December 2013 showed that his bone marrow was in normal morphology. His peripheral blood PCR able has improved significantly at 3 months. His last BCR able from June 2014 showed that he is in complete remission with major molecular response.  On 05/29/2014, as part of his routine visit, he was noted to have severe abnormal liver function tests. He was directed to the emergency department and had ultrasound which was unremarkable apart from biliary dilatation. Outpatient MRI pancreas was ordered and he came back suspicious for pancreatic cancer at the head of the pancreas. He was admitted for management of obstructive jaundice and possible newly diagnosed pancreatic cancer. He has intermittent pain around the right upper quadrant region. It is resolving with pain medicine. At the time of evaluation, he has minimum pain. He denies any nausea or constipation. Overall, he feels fine. Denies any skin itching. Appetite stable denies any recent weight loss.  MEDICAL HISTORY:  Past Medical History  Diagnosis Date  . Chronic back pain greater than 3 months duration   . Bulging discs   . Diabetes mellitus     type II; neuropathy;    . Hypertension   . High cholesterol   . Leukocytosis   . GERD (gastroesophageal reflux disease)   . Pancreatitis 2004  . OSA (obstructive sleep apnea) 2010  . Anxiety   . DDD (degenerative disc disease) 2005    lumbar spine  . History of smoking 08/01/2012  . Weight loss 08/01/2012  . Cough 08/01/2012  . 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 Left leg pain 08/05/2012  . 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 Chronic pancreatitis     SURGICAL HISTORY: Past Surgical History  Procedure Laterality Date  . Cholecystectomy    . Hernia repair    . Skin cancer removed  2012    right ear, basal cell carcinoma.   Marland Kitchen Spinal stretching    . Colonoscopy  2012    [facility]    SOCIAL HISTORY: History   Social History  . Marital Status: Married    Spouse Name: N/A    Number of Children: 2  . Years of Education: N/A   Occupational History  .      retired Programmer[employer]   Social History Main Topics  . Smoking status: Former Smoker -- 1.50 packs/day for 40 years    Quit date: 01/24/2004  . Smokeless tobacco: Never Used  . Alcohol Use: No  . Drug Use: No  . Sexual Activity: No   Other Topics Concern  . Not on file   Social History Narrative  . No narrative on file    FAMILY HISTORY: Family History  Problem Relation Age of Onset  . Heart attack Mother   . Cancer Mother 77    lung  . Heart attack Father   .  Stroke Father     ALLERGIES:  has No Known Allergies.  MEDICATIONS:  Current Facility-Administered Medications  Medication Dose Route Frequency Provider Last Rate Last Dose  . 0.9 %  sodium chloride infusion   Intravenous Continuous Janice Norrie, MD 75 mL/hr at 06/05/14 1339 1,000 mL at 06/05/14 1339  . albuterol (PROVENTIL) (2.5 MG/3ML) 0.083% nebulizer solution 2.5 mg  2.5 mg Nebulization Q2H PRN Annita Brod, MD      . antiseptic oral rinse (BIOTENE) solution 15 mL  15 mL Mouth Rinse BID Annita Brod, MD      . dasatinib [facility]) tablet 50 mg  50 mg Oral  QHS Annita Brod, MD      . insulin aspart (novoLOG) injection 0-9 Units  0-9 Units Subcutaneous TID WC Annita Brod, MD   3 Units at 06/05/14 1448  . morphine 2 MG/ML injection 1 mg  1 mg Intravenous Q3H PRN Annita Brod, MD   1 mg at 06/05/14 1345  . ondansetron (ZOFRAN) tablet 4 mg  4 mg Oral Q6H PRN Annita Brod, MD       Or  . ondansetron (ZOFRAN) injection 4 mg  4 mg Intravenous Q6H PRN Annita Brod, MD      . vitamin B-12 (CYANOCOBALAMIN) tablet 1,000 mcg  1,000 mcg Oral Daily Annita Brod, MD   1,000 mcg at 06/05/14 1515    REVIEW OF SYSTEMS:   Constitutional: Denies fevers, chills or abnormal night sweats Eyes: Denies blurriness of vision, double vision or watery eyes Ears, nose, mouth, throat, and face: Denies mucositis or sore throat Respiratory: Denies cough, dyspnea or wheezes Cardiovascular: Denies palpitation, chest discomfort or lower extremity swelling Skin: Denies abnormal skin rashes Lymphatics: Denies new lymphadenopathy or easy bruising Neurological:Denies numbness, tingling or new weaknesses Behavioral/Psych: Mood is stable, no new changes  All other systems were reviewed with the patient and are negative.  PHYSICAL EXAMINATION: ECOG PERFORMANCE STATUS: 1 - Symptomatic but completely ambulatory  Filed Vitals:   06/05/14 1324  BP: 150/77  Pulse: 74  Temp: 97.8 F (36.6 C)  Resp: 16   Filed Weights   06/04/14 2359 06/05/14 1324  Weight: 247 lb (112.038 kg) 250 lb (113.399 kg)    GENERAL:alert, no distress and comfortable. He is moderately obese SKIN: skin color, texture, turgor are normal, no rashes or significant lesions EYES: normal, conjunctiva are jaundiced and non-injected, sclera clear OROPHARYNX:no exudate, no erythema and lips, buccal mucosa, and tongue normal  NECK: supple, thyroid normal size, non-tender, without nodularity LYMPH:  no palpable lymphadenopathy in the cervical, axillary or inguinal LUNGS: clear to  auscultation and percussion with normal breathing effort HEART: regular rate & rhythm and no murmurs and no lower extremity edema ABDOMEN:abdomen soft, mild tenderness in the right upper quadrant with no rebound or guarding, with normal bowel sounds Musculoskeletal:no cyanosis of digits and no clubbing  PSYCH: alert & oriented x 3 with fluent speech NEURO: no focal motor/sensory deficits  LABORATORY DATA:  I have reviewed the data as listed Lab Results  Component Value Date   WBC 5.7 06/05/2014   HGB 14.3 06/05/2014   HCT 42.0 06/05/2014   MCV 97.9 06/05/2014   PLT 187 06/05/2014    Recent Labs  02/20/14 0902 05/29/14 0912 05/29/14 1425 06/05/14 0005  NA 145 140  --  137  K 4.6 4.4  --  4.5  CL  --   --   --  98  CO2 24  23  --  27  GLUCOSE 234* 294*  --  333*  BUN 16.0 15.6  --  18  CREATININE 1.6* 1.5*  --  1.47*  CALCIUM 9.8 10.5*  --  10.6*  GFRNONAA  --   --   --  46*  GFRAA  --   --   --  54*  PROT 6.8 7.2 7.2 7.2  ALBUMIN 3.8 3.8 3.8 3.7  AST 17 265 Repeated and Verified* 201* 176*  ALT 15 405 Repeated and Verified* 342* 313*  ALKPHOS 66 269* 284* 394*  BILITOT 0.47 1.25* 1.1 5.5*  BILIDIR  --   --  0.5*  --   IBILI  --   --  0.6  --     RADIOGRAPHIC STUDIES: I reviewed the imaging study. I have personally reviewed the radiological images as listed and agreed with the findings in the report. Mr Abdomen W Wo Contrast  06/05/2014   CLINICAL DATA:  Jaundice, elevated LFTs  EXAM: MRI ABDOMEN WITHOUT AND WITH CONTRAST  TECHNIQUE: Multiplanar multisequence MR imaging of the abdomen was performed both before and after the administration of intravenous contrast.  CONTRAST:  48mL MULTIHANCE GADOBENATE DIMEGLUMINE 529 MG/ML IV SOLN  COMPARISON:  Ultrasound 05/29/2014  FINDINGS: There is a moderate intrahepatic biliary duct dilatation and moderate to severe extrahepatic biliary ductal dilatation. The common bile duct and common hepatic duct measure up to 11 mm. There is abrupt  tapering of the common bile duct as it enters the pancreatic head as seen on coronal series 2, image 11. Additionally there is dilatation of the pancreatic duct and atrophy of the tail. There is abrupt narrowing of the pancreatic duct as it enters the genu region of the pancreas as seen on coronal series 3, image 26. This so-called "double duct sign" is consistent with a obstructing pancreatic lesion. On the T1 weighted pre and post-contrast images there is a hypo intense lesion in the head of the pancreas at the level of the ductal obstruction measuring 2.2 x 2.2 cm in axial dimension (image 66 series 102). This is most concerning for a pancreatic adenocarcinoma.  This likely adenocarcinoma abuts the most proximal aspect of the main portal vein along the ventral surface for approximately 2 cm. The celiac trunk in its trifurcation vessels as well as the superior mesenteric artery are not involved by the pancreatic lesion. There is a cystic lesion the pancreas proximal to the ductal obstruction measuring 2.4 cm.  There is no significant periportal peripancreatic lymphadenopathy. There is no focal enhancing hepatic lesion. The spleen the splenic vein appears normal. The left adrenal glands and is normal. There is a nodule measuring 11 mm extending from the right adrenal gland which demonstrates loss of signal intensity on the opposed phase imaging consistent with a small adrenal adenoma. Several nonenhancing cysts within the cortex of the right kidney.  No aggressive osseous lesion.  Lung bases appear clear.  IMPRESSION: 1. 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 2. Moderate intra and extrahepatic biliary duct dilatation. Recommend endoscopic ultrasound for tissue sampling as well as relief of the biliary obstruction. 3. Celiac trunk and trifurcation as well as the superior mesenteric artery are uninvolved by  the pancreatic lesion. 4. Proximal main portal vein abuts the lesion over 2 cm. 5. No evidence of metastatic adenopathy. 6. No evidence of liver metastasis. Findings conveyed to Rolland Porter, MD on 06/05/2014  at09:06.   Electronically Signed   By: Suzy Bouchard M.D.   On: 06/05/2014 09:07   ASSESSMENT & PLAN:  #1 pancreatic mass I have requested general surgery consultation. I will order a CT scan of the chest to complete staging. I will order a CA 19-9. It appears, on MRCP, the patient might be of candidate for surgical resection. I will wait for Gen. surgery consultation. #2 obstructive jaundice #3 elevated creatinine I recommend continue IV fluids to improve hydration and prevent worsening kidney function. The patient may need stent placement. #4 CML The patient is in complete remission. I recommend holding off his chemotherapy for now. #5 abdominal pain His pain appears to be well-controlled with current pain medicine. #6 CODE STATUS Full code #7 DVT prophylaxis He should be on Lovenox daily.   Orders Placed This Encounter  Procedures  . MR Abdomen W Wo Contrast    Standing Status: Standing     Number of Occurrences: 1     Standing Expiration Date:     Order Specific Question:  Reason for exam:    Answer:  JAUNDICE    Order Specific Question:  Reason for exam:    Answer:  ABDOMINAL PAIN    Order Specific Question:  Does the patient have a pacemaker or implanted devices?    Answer:  No    Order Specific Question:  What is the patient's sedation requirement?    Answer:  No Sedation  . CT Chest Wo Contrast    Standing Status: Standing     Number of Occurrences: 1     Standing Expiration Date:     Order Specific Question:  Reason for exam:    Answer:  staging pancreatic cancer, avoiding contrast if possible  . CBC with Differential    Standing Status: Standing     Number of Occurrences: 1     Standing Expiration Date:   . Comprehensive metabolic panel    Standing  Status: Standing     Number of Occurrences: 1     Standing Expiration Date:   . Lipase, blood    Standing Status: Standing     Number of Occurrences: 1     Standing Expiration Date:   . Cancer antigen 19-9    Standing Status: Standing     Number of Occurrences: 1     Standing Expiration Date:   . Comprehensive metabolic panel    Standing Status: Standing     Number of Occurrences: 1     Standing Expiration Date:   . CBC    Standing Status: Standing     Number of Occurrences: 1     Standing Expiration Date:   . Protime-INR    Standing Status: Standing     Number of Occurrences: 1     Standing Expiration Date:   . Protime-INR    Standing Status: Standing     Number of Occurrences: 1     Standing Expiration Date:   . Glucose, capillary    Standing Status: Standing     Number of Occurrences: 1     Standing Expiration Date:   . Diet clear liquid    Standing Status: Standing     Number of Occurrences: 1     Standing Expiration Date:     Order Specific Question:  Room service appropriate?    Answer:  Yes    Order Specific Question:  Fluid consistency:    Answer:  Thin  . Diet NPO time specified  Standing Status: Standing     Number of Occurrences: 1     Standing Expiration Date:   . Page Admitting Doctor upon patients arrival to unit/floor    Standing Status: Standing     Number of Occurrences: 1     Standing Expiration Date:   Marland Kitchen Vital signs    Standing Status: Standing     Number of Occurrences: 1     Standing Expiration Date:   . Bed rest    Standing Status: Standing     Number of Occurrences: 1     Standing Expiration Date:   Marland Kitchen Vital signs    Standing Status: Standing     Number of Occurrences: 1     Standing Expiration Date:   . Notify physician    Notify physician for pulse less than 55 or greater than 120, respiratory rate less than 12 or greater than 25, temperature greater than 100.5 F, urinary output less than 30 mL/kg/hr for four hours, systolic BP  less than 90 or greater than 952, diastolic BP less than 60 or greater than 100.    Standing Status: Standing     Number of Occurrences: 20     Standing Expiration Date:   . SCDs    Standing Status: Standing     Number of Occurrences: 1     Standing Expiration Date:     Order Specific Question:  Laterality    Answer:  Bilateral  . Up with assistance    Standing Status: Standing     Number of Occurrences: 84132     Standing Expiration Date:   . No meal coverage at this time    Standing Status: Standing     Number of Occurrences: 1     Standing Expiration Date:   . No basal insulin at this time    Standing Status: Standing     Number of Occurrences: 1     Standing Expiration Date:   . No HS correction Insulin    Standing Status: Standing     Number of Occurrences: 1     Standing Expiration Date:   . No basal insulin at this time    Standing Status: Standing     Number of Occurrences: 1     Standing Expiration Date:   . Full code    Standing Status: Standing     Number of Occurrences: 1     Standing Expiration Date:   . Consult to hospitalist    Standing Status: Standing     Number of Occurrences: 1     Standing Expiration Date:     Order Specific Question:  Place call to:    Answer:  [employer] photographer Question:  Reason for Consult    Answer:  Admit  . Saline lock IV    Standing Status: Standing     Number of Occurrences: 1     Standing Expiration Date:   . Admit to Inpatient (patient's expected length of stay will be greater than 2 midnights)    Standing Status: Standing     Number of Occurrences: 1     Standing Expiration Date:     Order Specific Question:  Hospital Area    Answer:  [facility] [[id number]]    Order Specific Question:  Diagnosis    Answer:  Pancreatic mass [[id number]]    Order Specific Question:  Level of Care    Answer:  Med-Surg [16]  Order Specific Question:  Admitting Physician    Answer:  Annita Brod  [phone]    Order Specific Question:  Estimated length of stay    Answer:  past midnight tomorrow    Order Specific Question:  Certification:    Answer:  I certify this patient will need inpatient services for at least 2 midnights    Order Specific Question:  Attending Physician    Answer:  Bethann Berkshire [[id number]]    Order Specific Question:  Bed request comments    Answer:  [facility] if possible    Order Specific Question:  PT Class (Do Not Modify)    Answer:  Inpatient [101]    Order Specific Question:  PT Acc Code (Do Not Modify)    Answer:  Private [1]  . Admit to Inpatient (patient's expected length of stay will be greater than 2 midnights)    Standing Status: Standing     Number of Occurrences: 1     Standing Expiration Date:     Order Specific Question:  Hospital Area    Answer:  [facility] [[id number]]    Order Specific Question:  Diagnosis    Answer:  Pancreatic mass [[id number]]    Order Specific Question:  Level of Care    Answer:  Med-Surg [16]    Order Specific Question:  Admitting Physician    Answer:  Annita Brod [[id number]]    Order Specific Question:  Estimated length of stay    Answer:  past midnight tomorrow    Order Specific Question:  Certification:    Answer:  I certify this patient will need inpatient services for at least 2 midnights    Order Specific Question:  Attending Physician    Answer:  Annita Brod [[id number]]    Order Specific Question:  PT Class (Do Not Modify)    Answer:  Inpatient [101]    Order Specific Question:  PT Acc Code (Do Not Modify)    Answer:  Private [1]    All questions were answered. The patient knows to call the clinic with any problems, questions or concerns.    [facility], [place], MD 06/05/2014 3:21 PM

## 2014-06-05 NOTE — ED Notes (Signed)
MD at bedside. Admitting  

## 2014-06-05 NOTE — H&P (Signed)
History and Physical  Evan [name] ENI:[id number] DOB: 1941-04-30 DOA: 06/05/2014  Referring physician: Rolland Porter PCP: [name], MD   Chief Complaint: Yellow eyes  HPI: Evan [name] is a 73 y.o. male  Past medical history of hypertension, diabetes and leukemia formation who was seen by oncology for routine followup last week and noted to have elevated transaminases. Underwent right upper quadrant ultrasound which was unrevealing and at that time bilirubin was normal. Patient was to followup with GI as outpatient, but appointment not yet able to be made. Advised by PCP to bring him to hospital if his sclera became icteric, which he did early this morning of 7/21.  In the emergency room, labs done noting bilirubin now 5.5, transaminitis, although slightly improved from last week. Patient underwent MRI of abdomen noting mass in head of pancreas, highly suspicious for adenocarcinoma. Hospitalists called for further evaluation and admission.   Review of Systems:  Patient seen in emergency room. Pt complains of fatigue. He's had some difficulty ambulating in the last 2 years today to neuropathy felt to be in part from diabetes and also possibly B12. In the last few months he has had problems with constipation requiring MiraLAX daily.  Pt denies any headache, vision changes, dysphagia, chest pain, palpitations, shortness of breath, wheeze, cough, abdominal pain, hematuria, dysuria, diarrhea, focal extremity pain.  Review of systems are otherwise negative  Past Medical History  Diagnosis Date  . Chronic back pain greater than 3 months duration   . Bulging discs   . Diabetes mellitus     type II; neuropathy;   . Hypertension   . High cholesterol   . Leukocytosis   . GERD (gastroesophageal reflux disease)   . Pancreatitis 2004  . OSA (obstructive sleep apnea) 2010  . Anxiety   . DDD (degenerative disc disease) 2005    lumbar spine  . History of smoking 08/01/2012  .  Weight loss 08/01/2012  . Cough 08/01/2012  . 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 Left leg pain 08/05/2012  . 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 Chronic pancreatitis    Past Surgical History  Procedure Laterality Date  . Cholecystectomy      laparoscopic  . Hernia repair      umbilical  . Skin cancer removed  2012    right ear, basal cell carcinoma.   Marland Kitchen Spinal stretching    . Colonoscopy  2012    [facility]   Social History:  reports that he quit smoking about 10 years ago. He has never used smokeless tobacco. He reports that he does not drink alcohol or use illicit drugs. Patient lives at home with his wife & is able to participate in activities of daily living with some difficulty given his neuropathy, requiring occasional can  No Known Allergies  Family History  Problem Relation Age of Onset  . Heart attack Mother   . Cancer Mother 3    lung  . Heart attack Father   . Stroke Father     Family history reviewed with patient  Prior to Admission medications   Medication Sig Start Date End Date Taking? Authorizing Provider  acetaminophen (TYLENOL) 325 MG tablet Take 650 mg by mouth every 6 (six) hours as needed for moderate pain.   Yes Historical Provider, MD  aspirin EC 81 MG tablet Take 81 mg by mouth daily.   Yes Historical Provider, MD  B Complex-C (SUPER B COMPLEX/VITAMIN C PO) Take 12 tablets by mouth daily.  Yes Historical Provider, MD  dasatinib (SPRYCEL) 50 MG tablet Take 1 tablet (50 mg total) by mouth daily. 01/15/14  Yes [name], MD  fluocinonide cream (LIDEX) 3.01 % Apply 1 application topically 2 (two) times daily as needed (rash). As needed 10/11/12  Yes Historical Provider, MD  insulin glargine (LANTUS) 100 UNIT/ML injection Inject 35 Units into the skin at bedtime.    Yes [name], MD  losartan (COZAAR) 100 MG tablet Take 100 mg by mouth daily.   Yes Historical Provider, MD  metFORMIN (GLUCOPHAGE) 1000 MG tablet Take 1,000 mg by mouth 2 (two) times  daily with a meal.   Yes Historical Provider, MD  ondansetron (ZOFRAN) 8 MG tablet Take 1 tablet (8 mg total) by mouth every 8 (eight) hours as needed for nausea. 09/05/12  Yes Maryanna Shape, NP  saxagliptin HCl (ONGLYZA) 5 MG TABS tablet Take 5 mg by mouth daily.   Yes Historical Provider, MD  simvastatin (ZOCOR) 20 MG tablet Take 20 mg by mouth every evening.   Yes Historical Provider, MD  vitamin B-12 (CYANOCOBALAMIN) 1000 MCG tablet Take 1,000 mcg by mouth daily.   Yes Historical Provider, MD    Physical Exam: BP 150/77  Pulse 74  Temp(Src) 97.8 F (36.6 C) (Oral)  Resp 16  Ht 6\' 2"  (1.88 m)  Wt 113.399 kg (250 lb)  BMI 32.08 kg/m2  SpO2 96%  General:  Alert and oriented x3, no acute distress Eyes: Mildly icteric, extraocular movements are intact ENT: Normocephalic, atraumatic, mucous membranes are slightly dry Neck: Thick, no JVD Cardiovascular: Regular rate and rhythm, S1-S2 Respiratory: Clear to auscultation bilaterally Abdomen: Soft, obese, nontender, positive bowel sounds Skin: No skin breaks, tears or lesions. His lower extremities from below the knees down bilaterally are smooth consistent with neuropathy Musculoskeletal: No clubbing or cyanosis, trace pitting edema Psychiatric: Patient is appropriate, no evidence of psychoses Neurologic: Significant lack of sensation in his feet up to mid calf bilaterally           Labs on Admission:  Basic Metabolic Panel:  Recent Labs Lab 06/05/14 0005  NA 137  K 4.5  CL 98  CO2 27  GLUCOSE 333*  BUN 18  CREATININE 1.47*  CALCIUM 10.6*   Liver Function Tests:  Recent Labs Lab 06/05/14 0005  AST 176*  ALT 313*  ALKPHOS 394*  BILITOT 5.5*  PROT 7.2  ALBUMIN 3.7    Recent Labs Lab 06/05/14 0005  LIPASE 172*   No results found for this basename: AMMONIA,  in the last 168 hours CBC:  Recent Labs Lab 06/05/14 0005  WBC 5.7  NEUTROABS 3.2  HGB 14.3  HCT 42.0  MCV 97.9  PLT 187   Cardiac  Enzymes: No results found for this basename: CKTOTAL, CKMB, CKMBINDEX, TROPONINI,  in the last 168 hours  BNP (last 3 results) No results found for this basename: PROBNP,  in the last 8760 hours CBG:  Recent Labs Lab 06/05/14 1338  GLUCAP 209*    Radiological Exams on Admission: Dg Eye Foreign Body  06/04/2014     IMPRESSION: No evidence of metallic foreign body within the orbits.   Electronically Signed   By: Rozetta Nunnery M.D.   On: 06/04/2014 11:28   Mr Abdomen W Wo Contrast  06/05/2014  .  IMPRESSION: 1. 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 2. Moderate intra and extrahepatic  biliary duct dilatation. Recommend endoscopic ultrasound for tissue sampling as well as relief of the biliary obstruction. 3. Celiac trunk and trifurcation as well as the superior mesenteric artery are uninvolved by the pancreatic lesion. 4. Proximal main portal vein abuts the lesion over 2 cm. 5. No evidence of metastatic adenopathy. 6. No evidence of liver metastasis. Findings conveyed to Rolland Porter, MD on 06/05/2014  at09:06.   Electronically Signed   By: Suzy Bouchard M.D.   On: 06/05/2014 09:07    Assessment/Plan Present on Admission:  . Pancreatic mass: Highly suspicious for adenocarcinoma. CA 19-9 level ordered. Fortunately, no evidence of metastases, lymph node involvement or pancreatic blood vessel entanglement. Seen by general surgery. Workup in progress.  . Hyperbilirubinemia: See below.  . Acute liver failure: Secondary to pancreatic obstruction. Gastroenterology seen. For possible ERCP in the next one to 2 days. Recheck bilirubin tomorrow morning and other LFTs. Fortunately, INR not elevated  . CML in remission: Noted.  . Diabetes mellitus with neuropathy: Appreciate diabetes educator help. Patient on half dose of Lantus plus every 4 hours moderate sliding scale. Holding oral  medications.  . Vitamin B12 deficiency neuropathy: Stable. Continue daily B12.  . Hypertension: Blood pressure stable. Continue home ARB.   . CKD stage 3 due to type 2 diabetes mellitus: In review of previous labs, creatinine looks to be at baseline.  obesity: Patient needs criteria but BMI greater than 30  Consultants:  Gastroenterology Oncology General surgery  Code Status: Full code  Family Communication: Discussed with wife at the bedside   Disposition Plan: Patient transferred to [address] [facility]. Likely will be there for several days to stabilize liver and consider treatment options for suspected pancreatic cancer  Time spent: 56 minutes  [facility] Pager [phone]  **Disclaimer: This note may have been dictated with voice recognition software. Similar sounding words can inadvertently be transcribed and this note may contain transcription errors which may not have been corrected upon publication of note.**

## 2014-06-06 ENCOUNTER — Inpatient Hospital Stay (HOSPITAL_COMMUNITY): Payer: Medicare Other

## 2014-06-06 ENCOUNTER — Encounter (HOSPITAL_COMMUNITY): Payer: Self-pay | Admitting: Radiology

## 2014-06-06 ENCOUNTER — Telehealth (INDEPENDENT_AMBULATORY_CARE_PROVIDER_SITE_OTHER): Payer: Self-pay | Admitting: General Surgery

## 2014-06-06 DIAGNOSIS — R911 Solitary pulmonary nodule: Secondary | ICD-10-CM

## 2014-06-06 LAB — GLUCOSE, CAPILLARY
GLUCOSE-CAPILLARY: 165 mg/dL — AB (ref 70–99)
Glucose-Capillary: 164 mg/dL — ABNORMAL HIGH (ref 70–99)
Glucose-Capillary: 186 mg/dL — ABNORMAL HIGH (ref 70–99)
Glucose-Capillary: 192 mg/dL — ABNORMAL HIGH (ref 70–99)
Glucose-Capillary: 237 mg/dL — ABNORMAL HIGH (ref 70–99)
Glucose-Capillary: 287 mg/dL — ABNORMAL HIGH (ref 70–99)
Glucose-Capillary: 348 mg/dL — ABNORMAL HIGH (ref 70–99)

## 2014-06-06 LAB — COMPREHENSIVE METABOLIC PANEL
ALT: 279 U/L — AB (ref 0–53)
AST: 184 U/L — AB (ref 0–37)
Albumin: 3.4 g/dL — ABNORMAL LOW (ref 3.5–5.2)
Alkaline Phosphatase: 385 U/L — ABNORMAL HIGH (ref 39–117)
Anion gap: 14 (ref 5–15)
BUN: 13 mg/dL (ref 6–23)
CALCIUM: 10.1 mg/dL (ref 8.4–10.5)
CO2: 21 mEq/L (ref 19–32)
Chloride: 102 mEq/L (ref 96–112)
Creatinine, Ser: 1.05 mg/dL (ref 0.50–1.35)
GFR calc non Af Amer: 69 mL/min — ABNORMAL LOW (ref >90)
GFR, EST AFRICAN AMERICAN: 80 mL/min — AB (ref >90)
GLUCOSE: 178 mg/dL — AB (ref 70–99)
Potassium: 4.1 mEq/L (ref 3.7–5.3)
Sodium: 137 mEq/L (ref 137–147)
TOTAL PROTEIN: 6.9 g/dL (ref 6.0–8.3)
Total Bilirubin: 5.4 mg/dL — ABNORMAL HIGH (ref 0.3–1.2)

## 2014-06-06 LAB — CBC
HCT: 40.6 % (ref 39.0–52.0)
HEMOGLOBIN: 13.6 g/dL (ref 13.0–17.0)
MCH: 33.3 pg (ref 26.0–34.0)
MCHC: 33.5 g/dL (ref 30.0–36.0)
MCV: 99.3 fL (ref 78.0–100.0)
Platelets: 174 10*3/uL (ref 150–400)
RBC: 4.09 MIL/uL — AB (ref 4.22–5.81)
RDW: 15.3 % (ref 11.5–15.5)
WBC: 5.7 10*3/uL (ref 4.0–10.5)

## 2014-06-06 LAB — CANCER ANTIGEN 19-9: CA 19-9: 1234 U/mL — ABNORMAL HIGH (ref <35.0)

## 2014-06-06 LAB — PROTIME-INR
INR: 0.94 (ref 0.00–1.49)
Prothrombin Time: 12.6 seconds (ref 11.6–15.2)

## 2014-06-06 LAB — HEMOGLOBIN A1C
Hgb A1c MFr Bld: 7.5 % — ABNORMAL HIGH (ref <5.7)
MEAN PLASMA GLUCOSE: 169 mg/dL — AB (ref <117)

## 2014-06-06 NOTE — Progress Notes (Signed)
Subjective: No complaints  Objective: Vital signs in last 24 hours: Temp:  [97.8 F (36.6 C)-98.3 F (36.8 C)] 98.2 F (36.8 C) (07/22 0545) Pulse Rate:  [70-99] 70 (07/22 0545) Resp:  [12-26] 20 (07/22 0545) BP: (127-159)/(63-85) 131/77 mmHg (07/22 0545) SpO2:  [92 %-96 %] 95 % (07/22 0545) Weight:  [113.399 kg (250 lb)] 113.399 kg (250 lb) (07/21 1324) Weight change: 1.361 kg (3 lb) Last BM Date: 06/04/14  Intake/Output from previous day: 07/21 0701 - 07/22 0700 In: 758.8 [P.O.:480; I.V.:278.8] Out: 1920 [Urine:1920] Intake/Output this shift: Total I/O In: -  Out: 1300 [Urine:1300]  General appearance: alert and cooperative Resp: clear to auscultation bilaterally Cardio: regular rate and rhythm, S1, S2 normal, no murmur, click, rub or gallop GI: soft, non-tender; bowel sounds normal; no masses,  no organomegaly Extremities: extremities normal, atraumatic, no cyanosis or edema  Lab Results:  Recent Labs  06/05/14 0005 06/06/14 0500  WBC 5.7 5.7  HGB 14.3 13.6  HCT 42.0 40.6  PLT 187 174   BMET  Recent Labs  06/05/14 0005 06/06/14 0500  NA 137 137  K 4.5 4.1  CL 98 102  CO2 27 21  GLUCOSE 333* 178*  BUN 18 13  CREATININE 1.47* 1.05  CALCIUM 10.6* 10.1    Studies/Results: Dg Eye Foreign Body  06/04/2014   CLINICAL DATA:  Metal working/exposure; clearance prior to MRI  EXAM: ORBITS FOR FOREIGN BODY - 2 VIEW  COMPARISON:  None.  FINDINGS: There is no evidence of metallic foreign body within the orbits. No significant bone abnormality identified.  IMPRESSION: No evidence of metallic foreign body within the orbits.   Electronically Signed   By: Rozetta Nunnery M.D.   On: 06/04/2014 11:28   Mr Abdomen W Wo Contrast  06/05/2014   CLINICAL DATA:  Jaundice, elevated LFTs  EXAM: MRI ABDOMEN WITHOUT AND WITH CONTRAST  TECHNIQUE: Multiplanar multisequence MR imaging of the abdomen was performed both before and after the administration of intravenous contrast.   CONTRAST:  38mL MULTIHANCE GADOBENATE DIMEGLUMINE 529 MG/ML IV SOLN  COMPARISON:  Ultrasound 05/29/2014  FINDINGS: There is a moderate intrahepatic biliary duct dilatation and moderate to severe extrahepatic biliary ductal dilatation. The common bile duct and common hepatic duct measure up to 11 mm. There is abrupt tapering of the common bile duct as it enters the pancreatic head as seen on coronal series 2, image 11. Additionally there is dilatation of the pancreatic duct and atrophy of the tail. There is abrupt narrowing of the pancreatic duct as it enters the genu region of the pancreas as seen on coronal series 3, image 26. This so-called "double duct sign" is consistent with a obstructing pancreatic lesion. On the T1 weighted pre and post-contrast images there is a hypo intense lesion in the head of the pancreas at the level of the ductal obstruction measuring 2.2 x 2.2 cm in axial dimension (image 66 series 102). This is most concerning for a pancreatic adenocarcinoma.  This likely adenocarcinoma abuts the most proximal aspect of the main portal vein along the ventral surface for approximately 2 cm. The celiac trunk in its trifurcation vessels as well as the superior mesenteric artery are not involved by the pancreatic lesion. There is a cystic lesion the pancreas proximal to the ductal obstruction measuring 2.4 cm.  There is no significant periportal peripancreatic lymphadenopathy. There is no focal enhancing hepatic lesion. The spleen the splenic vein appears normal. The left adrenal glands and is normal. There is a  nodule measuring 11 mm extending from the right adrenal gland which demonstrates loss of signal intensity on the opposed phase imaging consistent with a small adrenal adenoma. Several nonenhancing cysts within the cortex of the right kidney.  No aggressive osseous lesion.  Lung bases appear clear.  IMPRESSION: 1. A 2.2 cm lesion in the pancreatic head obstructing the common bile duct and the  pancreatic duct is highly concerning for pancreatic adenocarcinoma. 2. Moderate intra and extrahepatic biliary duct dilatation. Recommend endoscopic ultrasound for tissue sampling as well as relief of the biliary obstruction. 3. Celiac trunk and trifurcation as well as the superior mesenteric artery are uninvolved by the pancreatic lesion. 4. Proximal main portal vein abuts the lesion over 2 cm. 5. No evidence of metastatic adenopathy. 6. No evidence of liver metastasis. Findings conveyed to Rolland Porter, MD on 06/05/2014  at09:06.   Electronically Signed   By: Suzy Bouchard M.D.   On: 06/05/2014 09:07    Medications: I have reviewed the patient's current medications.  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

## 2014-06-06 NOTE — Progress Notes (Signed)
Inpatient Diabetes Program Recommendations  AACE/ADA: New Consensus Statement on Inpatient Glycemic Control (2013)  Target Ranges:  Prepandial:   less than 140 mg/dL      Peak postprandial:   less than 180 mg/dL (1-2 hours)      Critically ill patients:  140 - 180 mg/dL   Reason for Visit: Hyperglycemia  Results for Evan [name], Evan [name] (MRN [id number]) as of 06/06/2014 10:07  Ref. Range 06/05/2014 13:38 06/05/2014 18:03 06/05/2014 21:21 06/06/2014 00:18 06/06/2014 04:25  Glucose-Capillary Latest Range: 70-99 mg/dL 209 (H) 293 (H) 235 (H) 186 (H) 164 (H)   Blood sugars much improved. To start CHO mod med diet this morning.  Recommendations: Change Novolog to moderate tidwc and hs. Consider small increase in Lantus - 22 units QHS. (Home dose is 35 units) If post-prandial blood sugars >180 mg/dL, add Novolog 3 units tidwc  Will continue to follow. Thank you. Lorenda Peck, RD, LDN, CDE Inpatient Diabetes Coordinator [phone]

## 2014-06-06 NOTE — Progress Notes (Signed)
cbg 283 not able to pull pt's med rec up on glucomter.

## 2014-06-06 NOTE — Progress Notes (Signed)
TABARI VOLKERT   DOB:October 06, 1941   FQ#:[id number]    Subjective: Overall, he is feeling better. He denies pain in his abdomen. Denies nausea.  Objective:  Filed Vitals:   06/06/14 2008  BP: 139/73  Pulse: 82  Temp: 98.3 F (36.8 C)  Resp: 14     Intake/Output Summary (Last 24 hours) at 06/06/14 2112 Last data filed at 06/06/14 2008  Gross per 24 hour  Intake    480 ml  Output   1000 ml  Net   -520 ml    GENERAL:alert, no distress and comfortable SKIN: skin color, texture, turgor are normal, no rashes or significant lesions Musculoskeletal:no cyanosis of digits and no clubbing  NEURO: alert & oriented x 3 with fluent speech, no focal motor/sensory deficits   Labs:  Lab Results  Component Value Date   WBC 5.7 06/06/2014   HGB 13.6 06/06/2014   HCT 40.6 06/06/2014   MCV 99.3 06/06/2014   PLT 174 06/06/2014   NEUTROABS 3.2 06/05/2014    Lab Results  Component Value Date   NA 137 06/06/2014   K 4.1 06/06/2014   CL 102 06/06/2014   CO2 21 06/06/2014    Studies: I reviewed the CT scan of the chest myself.   Assessment & Plan:   #1 pancreatic mass  I have requested general surgery consultation.  CT scan of the chest showed 2 pulmonary nodules, likely benign. CA 19-9 is elevated, suggestive but not diagnostic of pancreatic cancer. It appears, on MRCP, the patient might be of candidate for surgical resection.  I will see the patient again after he has his surgery. There is role for adjuvant chemotherapy after surgery. #2 obstructive jaundice  #3 elevated creatinine  I recommend continue IV fluids to improve hydration and prevent worsening kidney function.  The patient may need stent placement.  #4 CML  The patient is in complete remission.  I recommend holding off his chemotherapy for now.  #5 abdominal pain  His pain appears to be well-controlled with current pain medicine.  #6 CODE STATUS  Full code  #7 DVT prophylaxis  He should be on Lovenox daily.  I  addressed multiple questions by family members. I will sign off.  [facility], [place], MD 06/06/2014  9:12 PM

## 2014-06-06 NOTE — Progress Notes (Signed)
Subjective: Is having some pruritus. Mild lower abdominal pain.  Objective: Vital signs in last 24 hours: Temp:  [97.8 F (36.6 C)-98.3 F (36.8 C)] 98.2 F (36.8 C) (07/22 0545) Pulse Rate:  [70-99] 70 (07/22 0545) Resp:  [12-20] 20 (07/22 0545) BP: (127-150)/(72-82) 131/77 mmHg (07/22 0545) SpO2:  [93 %-96 %] 95 % (07/22 0545) Weight:  [113.399 kg (250 lb)] 113.399 kg (250 lb) (07/21 1324) Weight change: 1.361 kg (3 lb) Last BM Date: 06/04/14  PE: GEN:  Jaundiced skin, sclera icteric. ABD:  Soft, non-tender  Lab Results: CBC    Component Value Date/Time   WBC 5.7 06/06/2014 0500   WBC 5.6 05/29/2014 0912   RBC 4.09* 06/06/2014 0500   RBC 4.17* 05/29/2014 0912   HGB 13.6 06/06/2014 0500   HGB 13.8 05/29/2014 0912   HCT 40.6 06/06/2014 0500   HCT 40.7 05/29/2014 0912   PLT 174 06/06/2014 0500   PLT 150 05/29/2014 0912   MCV 99.3 06/06/2014 0500   MCV 97.6 05/29/2014 0912   MCH 33.3 06/06/2014 0500   MCH 33.1 05/29/2014 0912   MCHC 33.5 06/06/2014 0500   MCHC 33.9 05/29/2014 0912   RDW 15.3 06/06/2014 0500   RDW 14.7* 05/29/2014 0912   LYMPHSABS 1.7 06/05/2014 0005   LYMPHSABS 1.4 05/29/2014 0912   MONOABS 0.7 06/05/2014 0005   MONOABS 0.5 05/29/2014 0912   EOSABS 0.2 06/05/2014 0005   EOSABS 0.1 05/29/2014 0912   BASOSABS 0.0 06/05/2014 0005   BASOSABS 0.0 05/29/2014 0912   CMP     Component Value Date/Time   NA 137 06/06/2014 0500   NA 140 05/29/2014 0912   K 4.1 06/06/2014 0500   K 4.4 05/29/2014 0912   CL 102 06/06/2014 0500   CL 107 04/27/2013 0926   CO2 21 06/06/2014 0500   CO2 23 05/29/2014 0912   GLUCOSE 178* 06/06/2014 0500   GLUCOSE 294* 05/29/2014 0912   GLUCOSE 212* 04/27/2013 0926   BUN 13 06/06/2014 0500   BUN 15.6 05/29/2014 0912   CREATININE 1.05 06/06/2014 0500   CREATININE 1.5* 05/29/2014 0912   CALCIUM 10.1 06/06/2014 0500   CALCIUM 10.5* 05/29/2014 0912   PROT 6.9 06/06/2014 0500   PROT 7.2 05/29/2014 0912   ALBUMIN 3.4* 06/06/2014 0500   ALBUMIN 3.8 05/29/2014 0912    AST 184* 06/06/2014 0500   AST 265 Repeated and Verified* 05/29/2014 0912   ALT 279* 06/06/2014 0500   ALT 405 Repeated and Verified* 05/29/2014 0912   ALKPHOS 385* 06/06/2014 0500   ALKPHOS 269* 05/29/2014 0912   BILITOT 5.4* 06/06/2014 0500   BILITOT 1.25* 05/29/2014 0912   GFRNONAA 69* 06/06/2014 0500   GFRAA 80* 06/06/2014 0500   Assessment:  1.  Obstructive jaundice due to pancreatic head mass.  Likely adenocarcinoma.  Ca 19-9 > 1000. 2.  Slightly elevated lipase.  Likely due to his tumor.  No radiographic or clinical evidence of pancreatitis. 3.  Chronic myelocytic anemia.  In remission.  Normal WBC count.  Plan:  1.  Plan for endoscopic procedures tomorrow morning, 1000. 2.  Endoscopic ultrasound with likely fine needle aspiration (for locoregional staging and tissue diagnosis). 3.  Endoscopic retrograde cholangiopancreatography with planned bile duct stent placement (to relieve jaundice). 4.  Risks and benefits of both procedures explained to patient and his spouse in great detail. 5.  Risks (bleeding, infection, bowel perforation that could require surgery, sedation-related changes in cardiopulmonary systems), benefits (identification and possible treatment of source of symptoms, exclusion of  certain causes of symptoms), and alternatives (watchful waiting, radiographic imaging studies, empiric medical treatment) of upper endoscopy with ultrasound and likely fine needle aspiration biopsies (EUS +/- FNA) were explained to patient/family in detail and patient wishes to proceed. 6.  Risks (up to and including bleeding, infection, perforation, pancreatitis that can be complicated by infected necrosis and death), benefits (removal of stones, alleviating blockage, decreasing risk of cholangitis or choledocholithiasis-related pancreatitis), and alternatives (watchful waiting, percutaneous transhepatic cholangiography) of ERCP were explained to patient/family in detail and patient elects to  proceed. 7.  Patient ok to have solid food today (and NPO after midnight) from my perspective, and has no clinical features of pancreatitis that would make me concerned about him having solid food. 8.  Will follow.  Landry Dyke 06/06/2014, 11:14 AM

## 2014-06-06 NOTE — Telephone Encounter (Signed)
°  Patient's wife calling in because Dr. Barry Dienes will be doing a whipple procedure on him.  He has not been scheduled yet, but they were told they will need a preop appt so that information can be given about the surgery.  Informed them because this will be a 30 minute slot for Dr. Barry Dienes, I will send this message to his assistant and she will work on getting this scheduled depending on how soon they are wanting to perform the whipple.  Patient's wife verbalized understanding and agreement with this.

## 2014-06-07 ENCOUNTER — Inpatient Hospital Stay (HOSPITAL_COMMUNITY): Payer: Medicare Other

## 2014-06-07 ENCOUNTER — Inpatient Hospital Stay (HOSPITAL_COMMUNITY): Payer: Medicare Other | Admitting: Anesthesiology

## 2014-06-07 ENCOUNTER — Encounter (HOSPITAL_COMMUNITY)
Admission: EM | Disposition: A | Discharge status: Home / Self Care | Payer: Medicare Other | Source: Home / Self Care | Attending: Internal Medicine

## 2014-06-07 ENCOUNTER — Encounter (HOSPITAL_COMMUNITY): Payer: Medicare Other | Admitting: Anesthesiology

## 2014-06-07 ENCOUNTER — Telehealth (INDEPENDENT_AMBULATORY_CARE_PROVIDER_SITE_OTHER): Payer: Self-pay

## 2014-06-07 ENCOUNTER — Encounter (HOSPITAL_COMMUNITY): Payer: Self-pay | Admitting: Anesthesiology

## 2014-06-07 HISTORY — PX: EUS: SHX5427

## 2014-06-07 HISTORY — PX: ERCP: SHX5425

## 2014-06-07 LAB — GLUCOSE, CAPILLARY
GLUCOSE-CAPILLARY: 189 mg/dL — AB (ref 70–99)
GLUCOSE-CAPILLARY: 205 mg/dL — AB (ref 70–99)
Glucose-Capillary: 127 mg/dL — ABNORMAL HIGH (ref 70–99)
Glucose-Capillary: 161 mg/dL — ABNORMAL HIGH (ref 70–99)
Glucose-Capillary: 168 mg/dL — ABNORMAL HIGH (ref 70–99)

## 2014-06-07 LAB — BASIC METABOLIC PANEL
Anion gap: 13 (ref 5–15)
BUN: 15 mg/dL (ref 6–23)
CALCIUM: 10.5 mg/dL (ref 8.4–10.5)
CO2: 23 meq/L (ref 19–32)
CREATININE: 0.98 mg/dL (ref 0.50–1.35)
Chloride: 101 mEq/L (ref 96–112)
GFR calc Af Amer: >90 mL/min (ref >90)
GFR, EST NON AFRICAN AMERICAN: 81 mL/min — AB (ref >90)
GLUCOSE: 140 mg/dL — AB (ref 70–99)
Potassium: 3.9 mEq/L (ref 3.7–5.3)
Sodium: 137 mEq/L (ref 137–147)

## 2014-06-07 SURGERY — UPPER ENDOSCOPIC ULTRASOUND (EUS) LINEAR
Anesthesia: General

## 2014-06-07 MED ORDER — CIPROFLOXACIN IN D5W 400 MG/200ML IV SOLN
INTRAVENOUS | Status: AC
  Filled 2014-06-07: qty 200

## 2014-06-07 MED ORDER — PHENYLEPHRINE HCL 10 MG/ML IJ SOLN
INTRAMUSCULAR | Status: DC | PRN
  Administered 2014-06-07 (×3): 80 ug via INTRAVENOUS

## 2014-06-07 MED ORDER — PROPOFOL 10 MG/ML IV BOLUS
INTRAVENOUS | Status: AC
  Filled 2014-06-07: qty 20

## 2014-06-07 MED ORDER — SODIUM CHLORIDE 0.9 % IV SOLN
INTRAVENOUS | Status: DC | PRN
  Administered 2014-06-07: 13:00:00

## 2014-06-07 MED ORDER — GLUCAGON HCL RDNA (DIAGNOSTIC) 1 MG IJ SOLR
INTRAMUSCULAR | Status: AC
  Filled 2014-06-07: qty 2

## 2014-06-07 MED ORDER — CIPROFLOXACIN IN D5W 400 MG/200ML IV SOLN
400.0000 mg | Freq: Once | INTRAVENOUS | Status: AC
  Administered 2014-06-07: 400 mg via INTRAVENOUS
  Filled 2014-06-07: qty 200

## 2014-06-07 MED ORDER — FENTANYL CITRATE 0.05 MG/ML IJ SOLN
INTRAMUSCULAR | Status: AC
  Filled 2014-06-07: qty 2

## 2014-06-07 MED ORDER — SODIUM CHLORIDE 0.9 % IV SOLN: INTRAVENOUS | Status: DC

## 2014-06-07 MED ORDER — FENTANYL CITRATE 0.05 MG/ML IJ SOLN
INTRAMUSCULAR | Status: DC | PRN
  Administered 2014-06-07: 75 ug via INTRAVENOUS
  Administered 2014-06-07: 100 ug via INTRAVENOUS
  Administered 2014-06-07: 25 ug via INTRAVENOUS

## 2014-06-07 MED ORDER — SODIUM CHLORIDE 0.9 % IJ SOLN
INTRAMUSCULAR | Status: AC
  Filled 2014-06-07: qty 10

## 2014-06-07 MED ORDER — LIDOCAINE HCL (CARDIAC) 20 MG/ML IV SOLN
INTRAVENOUS | Status: DC | PRN
  Administered 2014-06-07: 100 mg via INTRAVENOUS

## 2014-06-07 MED ORDER — GLUCAGON HCL RDNA (DIAGNOSTIC) 1 MG IJ SOLR
INTRAMUSCULAR | Status: DC | PRN
  Administered 2014-06-07: 1 mg via INTRAVENOUS

## 2014-06-07 MED ORDER — LIDOCAINE HCL (CARDIAC) 20 MG/ML IV SOLN
INTRAVENOUS | Status: AC
  Filled 2014-06-07: qty 5

## 2014-06-07 MED ORDER — GLYCOPYRROLATE 0.2 MG/ML IJ SOLN
INTRAMUSCULAR | Status: AC
  Filled 2014-06-07: qty 1

## 2014-06-07 MED ORDER — SUCCINYLCHOLINE CHLORIDE 20 MG/ML IJ SOLN
INTRAMUSCULAR | Status: DC | PRN
  Administered 2014-06-07: 100 mg via INTRAVENOUS

## 2014-06-07 MED ORDER — PHENYLEPHRINE 40 MCG/ML (10ML) SYRINGE FOR IV PUSH (FOR BLOOD PRESSURE SUPPORT)
PREFILLED_SYRINGE | INTRAVENOUS | Status: AC
  Filled 2014-06-07: qty 10

## 2014-06-07 MED ORDER — PROPOFOL 10 MG/ML IV BOLUS
INTRAVENOUS | Status: DC | PRN
  Administered 2014-06-07: 100 mg via INTRAVENOUS
  Administered 2014-06-07: 200 mg via INTRAVENOUS

## 2014-06-07 MED ORDER — GLYCOPYRROLATE 0.2 MG/ML IJ SOLN
INTRAMUSCULAR | Status: DC | PRN
  Administered 2014-06-07: .2 mg via INTRAVENOUS

## 2014-06-07 MED ORDER — LACTATED RINGERS IV SOLN: INTRAVENOUS | Status: DC

## 2014-06-07 MED ORDER — LACTATED RINGERS IV SOLN
INTRAVENOUS | Status: DC | PRN
  Administered 2014-06-07 (×2): via INTRAVENOUS

## 2014-06-07 SURGICAL SUPPLY — 1 items: WallFlex fully covered biliary stent 10mmx60mm ×4 IMPLANT

## 2014-06-07 NOTE — H&P (View-Only) (Signed)
Subjective: No complaints  Objective: Vital signs in last 24 hours: Temp:  [98 F (36.7 C)-98.3 F (36.8 C)] 98.3 F (36.8 C) (07/22 2008) Pulse Rate:  [67-82] 82 (07/22 2008) Resp:  [14-18] 14 (07/22 2008) BP: (139-155)/(73-80) 139/73 mmHg (07/22 2008) SpO2:  [96 %] 96 % (07/22 2008) Weight change:  Last BM Date: 06/04/14  Intake/Output from previous day: 07/22 0701 - 07/23 0700 In: 480 [P.O.:480] Out: 200 [Urine:200] Intake/Output this shift: Total I/O In: 480 [P.O.:480] Out: 200 [Urine:200]  General appearance: alert and cooperative GI: soft, non-tender; bowel sounds normal; no masses,  no organomegaly  Lab Results:  Recent Labs  06/05/14 0005 06/06/14 0500  WBC 5.7 5.7  HGB 14.3 13.6  HCT 42.0 40.6  PLT 187 174   BMET  Recent Labs  06/06/14 0500 06/07/14 0404  NA 137 137  K 4.1 3.9  CL 102 101  CO2 21 23  GLUCOSE 178* 140*  BUN 13 15  CREATININE 1.05 0.98  CALCIUM 10.1 10.5    Studies/Results: Ct Chest Wo Contrast  06/06/2014   CLINICAL DATA:  Potential pancreatic adenocarcinoma.  Staging  EXAM: CT CHEST WITHOUT CONTRAST  TECHNIQUE: Multidetector CT imaging of the chest was performed following the standard protocol without IV contrast.  COMPARISON:  MRI abdomen 721 1,015  FINDINGS: There is a rounded nodule along the superior aspect of the left oblique fissure measuring 7 mm (image 21, series 5). 5 mm noncalcified pulmonary nodule at the left lower lobe (image 38, series 5). There is bibasilar linear atelectasis / scarring. Centrilobular emphysema noted in the upper lobes.  No axillary or supraclavicular lymphadenopathy. Mild symmetric gynecomastia. No mediastinal lymphadenopathy. Coronary calcifications are noted. No pericardial fluid. Esophagus is normal.  Upper abdominal abnormalities concerning for pancreatic carcinoma described on MRI of 06/05/2014.  Review of the bone windows demonstrates no aggressive osseous lesion.  IMPRESSION: 1. Two small left  lung pulmonary nodule are probably benign. Recommend attention on follow-up. 2. Centrilobular emphysema.   Electronically Signed   By: Suzy Bouchard M.D.   On: 06/06/2014 08:21   Mr Abdomen W Wo Contrast  06/05/2014   CLINICAL DATA:  Jaundice, elevated LFTs  EXAM: MRI ABDOMEN WITHOUT AND WITH CONTRAST  TECHNIQUE: Multiplanar multisequence MR imaging of the abdomen was performed both before and after the administration of intravenous contrast.  CONTRAST:  35mL MULTIHANCE GADOBENATE DIMEGLUMINE 529 MG/ML IV SOLN  COMPARISON:  Ultrasound 05/29/2014  FINDINGS: There is a moderate intrahepatic biliary duct dilatation and moderate to severe extrahepatic biliary ductal dilatation. The common bile duct and common hepatic duct measure up to 11 mm. There is abrupt tapering of the common bile duct as it enters the pancreatic head as seen on coronal series 2, image 11. Additionally there is dilatation of the pancreatic duct and atrophy of the tail. There is abrupt narrowing of the pancreatic duct as it enters the genu region of the pancreas as seen on coronal series 3, image 26. This so-called "double duct sign" is consistent with a obstructing pancreatic lesion. On the T1 weighted pre and post-contrast images there is a hypo intense lesion in the head of the pancreas at the level of the ductal obstruction measuring 2.2 x 2.2 cm in axial dimension (image 66 series 102). This is most concerning for a pancreatic adenocarcinoma.  This likely adenocarcinoma abuts the most proximal aspect of the main portal vein along the ventral surface for approximately 2 cm. The celiac trunk in its trifurcation vessels as well  as the superior mesenteric artery are not involved by the pancreatic lesion. There is a cystic lesion the pancreas proximal to the ductal obstruction measuring 2.4 cm.  There is no significant periportal peripancreatic lymphadenopathy. There is no focal enhancing hepatic lesion. The spleen the splenic vein appears  normal. The left adrenal glands and is normal. There is a nodule measuring 11 mm extending from the right adrenal gland which demonstrates loss of signal intensity on the opposed phase imaging consistent with a small adrenal adenoma. Several nonenhancing cysts within the cortex of the right kidney.  No aggressive osseous lesion.  Lung bases appear clear.  IMPRESSION: 1. 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 2. Moderate intra and extrahepatic biliary duct dilatation. Recommend endoscopic ultrasound for tissue sampling as well as relief of the biliary obstruction. 3. Celiac trunk and trifurcation as well as the superior mesenteric artery are uninvolved by the pancreatic lesion. 4. Proximal main portal vein abuts the lesion over 2 cm. 5. No evidence of metastatic adenopathy. 6. No evidence of liver metastasis. Findings conveyed to Rolland Porter, MD on 06/05/2014  at09:06.   Electronically Signed   By: Suzy Bouchard M.D.   On: 06/05/2014 09:07    Medications: I have reviewed the patient's current medications.  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

## 2014-06-07 NOTE — Anesthesia Preprocedure Evaluation (Addendum)
Anesthesia Evaluation  Patient identified by MRN, date of birth, ID band Patient awake  General Assessment Comment:  Chronic back pain greater than 3 months duration     .  Bulging discs     .  Diabetes mellitus         type II; neuropathy;    .  Hypertension     .  High cholesterol     .  Leukocytosis     .  GERD (gastroesophageal reflux disease)     .  Pancreatitis  2004   .  OSA (obstructive sleep apnea)  2010   .  Anxiety     .  DDD (degenerative disc disease)  2005       lumbar spine   .  History of smoking  08/01/2012   .  Weight loss  08/01/2012   .  Cough  08/01/2012   .  CML (chronic myelocytic leukemia)  08/05/2012   .  Left leg pain  08/05/2012   .  CML in remission  01/15/2014   .  Chronic pancreatitis           Reviewed: Allergy & Precautions, H&P , NPO status , Patient's Chart, lab work & pertinent test results  Airway Mallampati: II TM Distance: >3 FB Neck ROM: Full    Dental no notable dental hx.    Pulmonary sleep apnea , former smoker,  breath sounds clear to auscultation  Pulmonary exam normal       Cardiovascular Exercise Tolerance: Good hypertension, Pt. on medications negative cardio ROS  Rhythm:Regular Rate:Normal     Neuro/Psych Anxiety negative neurological ROS     GI/Hepatic Neg liver ROS, GERD-  Medicated,  Endo/Other  diabetes, Type 2, Insulin Dependent, Oral Hypoglycemic Agents  Renal/GU Renal disease  negative genitourinary   Musculoskeletal negative musculoskeletal ROS (+)   Abdominal (+) + obese,   Peds negative pediatric ROS (+)  Hematology negative hematology ROS (+)   Anesthesia Other Findings   Reproductive/Obstetrics negative OB ROS                          Anesthesia Physical Anesthesia Plan  ASA: III  Anesthesia Plan: General   Post-op Pain Management:    Induction: Intravenous  Airway Management Planned: Oral ETT  Additional  Equipment:   Intra-op Plan:   Post-operative Plan: Extubation in OR  Informed Consent: I have reviewed the patients History and Physical, chart, labs and discussed the procedure including the risks, benefits and alternatives for the proposed anesthesia with the patient or authorized representative who has indicated his/her understanding and acceptance.   Dental advisory given  Plan Discussed with: CRNA  Anesthesia Plan Comments: (Discussed general and MAC with Dr. Paulita Fujita. Feel general probably best.)        Anesthesia Quick Evaluation

## 2014-06-07 NOTE — Progress Notes (Signed)
Subjective: No complaints  Objective: Vital signs in last 24 hours: Temp:  [98 F (36.7 C)-98.3 F (36.8 C)] 98.3 F (36.8 C) (07/22 2008) Pulse Rate:  [67-82] 82 (07/22 2008) Resp:  [14-18] 14 (07/22 2008) BP: (139-155)/(73-80) 139/73 mmHg (07/22 2008) SpO2:  [96 %] 96 % (07/22 2008) Weight change:  Last BM Date: 06/04/14  Intake/Output from previous day: 07/22 0701 - 07/23 0700 In: 480 [P.O.:480] Out: 200 [Urine:200] Intake/Output this shift: Total I/O In: 480 [P.O.:480] Out: 200 [Urine:200]  General appearance: alert and cooperative GI: soft, non-tender; bowel sounds normal; no masses,  no organomegaly  Lab Results:  Recent Labs  06/05/14 0005 06/06/14 0500  WBC 5.7 5.7  HGB 14.3 13.6  HCT 42.0 40.6  PLT 187 174   BMET  Recent Labs  06/06/14 0500 06/07/14 0404  NA 137 137  K 4.1 3.9  CL 102 101  CO2 21 23  GLUCOSE 178* 140*  BUN 13 15  CREATININE 1.05 0.98  CALCIUM 10.1 10.5    Studies/Results: Ct Chest Wo Contrast  06/06/2014   CLINICAL DATA:  Potential pancreatic adenocarcinoma.  Staging  EXAM: CT CHEST WITHOUT CONTRAST  TECHNIQUE: Multidetector CT imaging of the chest was performed following the standard protocol without IV contrast.  COMPARISON:  MRI abdomen 721 1,015  FINDINGS: There is a rounded nodule along the superior aspect of the left oblique fissure measuring 7 mm (image 21, series 5). 5 mm noncalcified pulmonary nodule at the left lower lobe (image 38, series 5). There is bibasilar linear atelectasis / scarring. Centrilobular emphysema noted in the upper lobes.  No axillary or supraclavicular lymphadenopathy. Mild symmetric gynecomastia. No mediastinal lymphadenopathy. Coronary calcifications are noted. No pericardial fluid. Esophagus is normal.  Upper abdominal abnormalities concerning for pancreatic carcinoma described on MRI of 06/05/2014.  Review of the bone windows demonstrates no aggressive osseous lesion.  IMPRESSION: 1. Two small left  lung pulmonary nodule are probably benign. Recommend attention on follow-up. 2. Centrilobular emphysema.   Electronically Signed   By: Suzy Bouchard M.D.   On: 06/06/2014 08:21   Mr Abdomen W Wo Contrast  06/05/2014   CLINICAL DATA:  Jaundice, elevated LFTs  EXAM: MRI ABDOMEN WITHOUT AND WITH CONTRAST  TECHNIQUE: Multiplanar multisequence MR imaging of the abdomen was performed both before and after the administration of intravenous contrast.  CONTRAST:  48mL MULTIHANCE GADOBENATE DIMEGLUMINE 529 MG/ML IV SOLN  COMPARISON:  Ultrasound 05/29/2014  FINDINGS: There is a moderate intrahepatic biliary duct dilatation and moderate to severe extrahepatic biliary ductal dilatation. The common bile duct and common hepatic duct measure up to 11 mm. There is abrupt tapering of the common bile duct as it enters the pancreatic head as seen on coronal series 2, image 11. Additionally there is dilatation of the pancreatic duct and atrophy of the tail. There is abrupt narrowing of the pancreatic duct as it enters the genu region of the pancreas as seen on coronal series 3, image 26. This so-called "double duct sign" is consistent with a obstructing pancreatic lesion. On the T1 weighted pre and post-contrast images there is a hypo intense lesion in the head of the pancreas at the level of the ductal obstruction measuring 2.2 x 2.2 cm in axial dimension (image 66 series 102). This is most concerning for a pancreatic adenocarcinoma.  This likely adenocarcinoma abuts the most proximal aspect of the main portal vein along the ventral surface for approximately 2 cm. The celiac trunk in its trifurcation vessels as well  as the superior mesenteric artery are not involved by the pancreatic lesion. There is a cystic lesion the pancreas proximal to the ductal obstruction measuring 2.4 cm.  There is no significant periportal peripancreatic lymphadenopathy. There is no focal enhancing hepatic lesion. The spleen the splenic vein appears  normal. The left adrenal glands and is normal. There is a nodule measuring 11 mm extending from the right adrenal gland which demonstrates loss of signal intensity on the opposed phase imaging consistent with a small adrenal adenoma. Several nonenhancing cysts within the cortex of the right kidney.  No aggressive osseous lesion.  Lung bases appear clear.  IMPRESSION: 1. 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 2. Moderate intra and extrahepatic biliary duct dilatation. Recommend endoscopic ultrasound for tissue sampling as well as relief of the biliary obstruction. 3. Celiac trunk and trifurcation as well as the superior mesenteric artery are uninvolved by the pancreatic lesion. 4. Proximal main portal vein abuts the lesion over 2 cm. 5. No evidence of metastatic adenopathy. 6. No evidence of liver metastasis. Findings conveyed to Rolland Porter, MD on 06/05/2014  at09:06.   Electronically Signed   By: Suzy Bouchard M.D.   On: 06/05/2014 09:07    Medications: I have reviewed the patient's current medications.  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

## 2014-06-07 NOTE — Progress Notes (Addendum)
Delayed entry.  Pt seen 7/22.  Subjective: Pt is feeling a bit better than admission.    Objective: Vital signs in last 24 hours: Temp:  [98 F (36.7 C)-98.3 F (36.8 C)] 98.1 F (36.7 C) (07/23 0911) Pulse Rate:  [67-86] 86 (07/23 0911) Resp:  [14-18] 16 (07/23 0911) BP: (139-156)/(66-80) 156/77 mmHg (07/23 0911) SpO2:  [94 %-96 %] 95 % (07/23 0911) Last BM Date: 06/06/14  Intake/Output from previous day: 07/22 0701 - 07/23 0700 In: 480 [P.O.:480] Out: 200 [Urine:200] Intake/Output this shift:    General appearance: alert, cooperative and no distress Resp: breathing comfortably GI: soft, non-tender; bowel sounds normal; no masses,  no organomegaly  Lab Results:   Recent Labs  06/05/14 0005 06/06/14 0500  WBC 5.7 5.7  HGB 14.3 13.6  HCT 42.0 40.6  PLT 187 174   BMET  Recent Labs  06/06/14 0500 06/07/14 0404  NA 137 137  K 4.1 3.9  CL 102 101  CO2 21 23  GLUCOSE 178* 140*  BUN 13 15  CREATININE 1.05 0.98  CALCIUM 10.1 10.5   PT/INR  Recent Labs  06/05/14 1355 06/06/14 0500  LABPROT 11.8 12.6  INR 0.87 0.94   ABG No results found for this basename: PHART, PCO2, PO2, HCO3,  in the last 72 hours  Studies/Results: Ct Chest Wo Contrast  06/06/2014   CLINICAL DATA:  Potential pancreatic adenocarcinoma.  Staging  EXAM: CT CHEST WITHOUT CONTRAST  TECHNIQUE: Multidetector CT imaging of the chest was performed following the standard protocol without IV contrast.  COMPARISON:  MRI abdomen 721 1,015  FINDINGS: There is a rounded nodule along the superior aspect of the left oblique fissure measuring 7 mm (image 21, series 5). 5 mm noncalcified pulmonary nodule at the left lower lobe (image 38, series 5). There is bibasilar linear atelectasis / scarring. Centrilobular emphysema noted in the upper lobes.  No axillary or supraclavicular lymphadenopathy. Mild symmetric gynecomastia. No mediastinal lymphadenopathy. Coronary calcifications are noted. No pericardial  fluid. Esophagus is normal.  Upper abdominal abnormalities concerning for pancreatic carcinoma described on MRI of 06/05/2014.  Review of the bone windows demonstrates no aggressive osseous lesion.  IMPRESSION: 1. Two small left lung pulmonary nodule are probably benign. Recommend attention on follow-up. 2. Centrilobular emphysema.   Electronically Signed   By: Suzy Bouchard M.D.   On: 06/06/2014 08:21    Anti-infectives: Anti-infectives   Start     Dose/Rate Route Frequency Ordered Stop   06/07/14 0915  ciprofloxacin (CIPRO) IVPB 400 mg     400 mg 200 mL/hr over 60 Minutes Intravenous  Once 06/07/14 2778        Assessment/Plan:  Malignant neoplasm of head of pancreas Pt appears to have pancreatic cancer in head/uncinate process.  There is no evidence of metastatic disease.  He does not have any absolute contraindications to surgery, but I do think his body habitus and overall health may lend to a difficult recovery.  I discussed risks and benefits of surgery.  I reviewed the recovery in hospital and out of hospital.  I discussed the likelihood that he would need more treatment.  I also advised of the risk of recurrence after surgery, and the possibility of finding metastatic disease.    He is getting his EUS/ERCP tomorrow.  We will hopefully have a biopsy at that point. I will follow him up in clinic.  I advised him to bring his family and questions to the visit.  That way, he can make  informed decision whether or not to undergo surgery.     I also discussed the possibility of a diagnostic laparoscopy as a separate procedure.  This way we can factor out metastatic disease.      LOS: 2 days    [facility] 06/07/2014

## 2014-06-07 NOTE — Anesthesia Procedure Notes (Signed)
Procedure Name: Intubation Date/Time: 06/07/2014 10:12 AM Performed by: Sharlette Dense Pre-anesthesia Checklist: Patient identified Patient Re-evaluated:Patient Re-evaluated prior to inductionOxygen Delivery Method: Circle system utilized Preoxygenation: Pre-oxygenation with 100% oxygen Intubation Type: IV induction Ventilation: Mask ventilation without difficulty and Oral airway inserted - appropriate to patient size Laryngoscope Size: Miller and 3 Grade View: Grade II Tube type: Oral Tube size: 7.5 mm Number of attempts: 1 Airway Equipment and Method: Stylet Placement Confirmation: ETT inserted through vocal cords under direct vision,  breath sounds checked- equal and bilateral and positive ETCO2 Secured at: 21 cm Tube secured with: Tape Dental Injury: Teeth and Oropharynx as per pre-operative assessment

## 2014-06-07 NOTE — Telephone Encounter (Signed)
LMOV pt has appt with Dr. Barry Dienes on 06/21/14 at 10:30 a.m.

## 2014-06-07 NOTE — Anesthesia Postprocedure Evaluation (Signed)
  Anesthesia Post-op Note  Patient: Evan [name]  Procedure(s) Performed: Procedure(s) (LRB): UPPER ENDOSCOPIC ULTRASOUND (EUS) LINEAR (Left) ENDOSCOPIC RETROGRADE CHOLANGIOPANCREATOGRAPHY (ERCP) (N/A)  Patient Location: PACU  Anesthesia Type: General  Level of Consciousness: awake and alert   Airway and Oxygen Therapy: Patient Spontanous Breathing  Post-op Pain: mild  Post-op Assessment: Post-op Vital signs reviewed, Patient's Cardiovascular Status Stable, Respiratory Function Stable, Patent Airway and No signs of Nausea or vomiting  Last Vitals:  Filed Vitals:   06/07/14 1329  BP: 124/56  Pulse: 83  Temp: 36.4 C  Resp: 16    Post-op Vital Signs: stable   Complications: No apparent anesthesia complications

## 2014-06-07 NOTE — Op Note (Signed)
[place], [id number]   ENDOSCOPIC ULTRASOUND PROCEDURE REPORT  PATIENT: Evan [name], Evan [name]  MR#: [id number] BIRTHDATE: 24-Nov-1940  GENDER: Male ENDOSCOPIST: Arta Silence, MD REFERRED BY:  Lavone Orn, M.D. PROCEDURE DATE:  06/07/2014 PROCEDURE:   Upper EUS w/FNA ASA CLASS:      Class III INDICATIONS:   1.  obstructive jaundice, pancreatic head mass. MEDICATIONS: General endotracheal anesthesia (GETA)  DESCRIPTION OF PROCEDURE:   After the risks benefits and alternatives of the procedure were  explained, informed consent was obtained. The patient was then placed in the left, lateral, decubitus postion and IV sedation was administered. Throughout the procedure, the patients blood pressure, pulse and oxygen saturations were monitored continuously.  Under direct visualization, the linear oblique-viewing  echoendoscope was introduced through the mouth  and advanced to the bulb of duodenum .  Water was used as necessary to provide an acoustic interface. Upon completion of the imaging, water was removed and the patient was sent to the recovery room in satisfactory condition.    FINDINGS:      3cm x 2cm hypoechoic round ill-defined mass in head of pancreas with upstream biliary ductal dilatation.  Bile duct dilated to about 1mm.  The mass invades the portal vein for an approximately 2cm long segment, beginning just upstream of the portal confluence.  The mass does not appear to involve the SMV, SMA, or celiac artery.  There is no neighboring adenopathy.  The mass was biopsied x 3 with 25g FNA needle; preliminary cytology, reviewed in my presence by Dr. Donato Heinz, is worrisome for adenocarcinoma.  IMPRESSION:     Pancreatic head mass with invasion of portal vein. Locoregional staging by endoscopic ultrasound is T3 N0 Mx.  RECOMMENDATIONS:     1.  Watch for potential complications of procedure. 2.  Await cytology results. 3.  Proceed  with ERCP today for hopeful biliary decompression via biliary stent.   _______________________________ eSignedArta Silence, MD 06/07/2014 11:15 AM   CC:

## 2014-06-07 NOTE — Op Note (Signed)
[facility] [place], [id number]   ERCP PROCEDURE REPORT  PATIENT: Evan [name], Evan [name]  MR# :[id number] BIRTHDATE: [birth year]  GENDER: Male ENDOSCOPIST: Arta Silence, MD REFERRED BY: Lavone Orn, M.D. PROCEDURE DATE:  06/07/2014 PROCEDURE:   ERCP with stent placement ASA CLASS:    ASA-III INDICATIONS: Obstructive jaundice, pancreatic head mass MEDICATIONS:    General endotracheal anesthesia, ciprofloxacin 400 mg IV, glucagon 1mg  IV  DESCRIPTION OF PROCEDURE:   After the risks benefits and alternatives of the procedure were thoroughly explained, informed consent was obtained.  The side-viewing duodenoscope was introduced through the mouth and advanced to the second portion of the duodenum .    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 However, I could not seat the papillotome in this area and could not find orifice to permit passage of guidewire.  All the while bile began accumulating around this region, but not from this region, making me think that perhaps this area was a very atypically enlarged minor papilla and that the major papilla was elsewhere.  I spent about one hour looking for the major papilla. With time, I saw bile continuing to accumulate within a fold just distal to the now presumed minor papilla.  I gently manipulated this area and a guidewire was readily passed proximally.  Contrast injection confirmed that I had achieved deep biliary access.  The distal 1cm of the bile duct was normal, followed by a 3cm long tight distal CBD stricture followed by upstream intra- and extrahepatic biliary ductal dilatation.  A 88mm x 58mm fully covered biliary wallstent was placed across the stricture with appropriate positioning confirmed endoscopically and fluoroscopically.  There was good flow of bile through the stent post-procedure.  There were no immediate complications. Pancreatogram was not obtained,  intentionally.  ENDOSCOPIC IMPRESSION:  Very prominent minor papilla.  Very subtle major papilla.  Distal bile duct stricture, stented as above.  RECOMMENDATIONS: 1.  Watch for potential complications of procedure. 2.  Follow clinical and biochemical response to stenting. 3.  Patient can possibly be discharged later today or early tomorrow morning, if no obvious post-procedural complications. 4.  [facility] will follow. 5.  Outpatient surgical evaluation by Dr. Barry Dienes to be arranged.     _______________________________ Lorrin MaisArta Silence, MD 06/07/2014 12:48 PM  CC:

## 2014-06-07 NOTE — Progress Notes (Signed)
06/07/14 UR completed.  Mariane Masters, BSN, CM [phone].

## 2014-06-07 NOTE — Interval H&P Note (Signed)
History and Physical Interval Note:  06/07/2014 10:02 AM  Evan [name]  has presented today for surgery, with the diagnosis of obstructive jaundice, pancreatic head mass  The various methods of treatment have been discussed with the patient and family. After consideration of risks, benefits and other options for treatment, the patient has consented to  Procedure(s): UPPER ENDOSCOPIC ULTRASOUND (EUS) LINEAR (Left) ENDOSCOPIC RETROGRADE CHOLANGIOPANCREATOGRAPHY (ERCP) (N/A) as a surgical intervention .  The patient's history has been reviewed, patient examined, no change in status, stable for surgery.  I have reviewed the patient's chart and labs.  Questions were answered to the patient's satisfaction.     Raunel Dimartino,Dmarcus M  Assessment:  1.  Obstructive jaundice. 2.  Pancreatic head mass.  Plan:  1.  Endoscopic ultrasound with likely fine needle aspiration. 2.  Risks (bleeding, infection, bowel perforation that could require surgery, sedation-related changes in cardiopulmonary systems), benefits (identification and possible treatment of source of symptoms, exclusion of certain causes of symptoms), and alternatives (watchful waiting, radiographic imaging studies, empiric medical treatment) of upper endoscopy with ultrasound and biopsies (EUS +/- FNA) were explained to patient/family in detail and patient wishes to proceed. 3.  Endoscopic retrograde cholangiopancreatography with anticipated bile duct stent placement. 4.  Risks (up to and including bleeding, infection, perforation, pancreatitis that can be complicated by infected necrosis and death), benefits (removal of stones, alleviating blockage, decreasing risk of cholangitis or choledocholithiasis-related pancreatitis), and alternatives (watchful waiting, percutaneous transhepatic cholangiography) of ERCP were explained to patient/family in detail and patient elects to proceed.

## 2014-06-07 NOTE — Transfer of Care (Signed)
Immediate Anesthesia Transfer of Care Note  Patient: Evan [name]  Procedure(s) Performed: Procedure(s): UPPER ENDOSCOPIC ULTRASOUND (EUS) LINEAR (Left) ENDOSCOPIC RETROGRADE CHOLANGIOPANCREATOGRAPHY (ERCP) (N/A)  Patient Location: PACU  Anesthesia Type:General  Level of Consciousness: awake and oriented  Airway & Oxygen Therapy: Patient Spontanous Breathing and Patient connected to nasal cannula oxygen  Post-op Assessment: Report given to PACU RN and Post -op Vital signs reviewed and stable  Post vital signs: Reviewed and stable  Complications: No apparent anesthesia complications

## 2014-06-08 ENCOUNTER — Other Ambulatory Visit (INDEPENDENT_AMBULATORY_CARE_PROVIDER_SITE_OTHER): Payer: Self-pay | Admitting: General Surgery

## 2014-06-08 ENCOUNTER — Telehealth: Payer: Self-pay | Admitting: Neurology

## 2014-06-08 ENCOUNTER — Other Ambulatory Visit: Payer: Self-pay | Admitting: Hematology and Oncology

## 2014-06-08 ENCOUNTER — Telehealth: Payer: Self-pay | Admitting: Hematology and Oncology

## 2014-06-08 ENCOUNTER — Encounter (HOSPITAL_COMMUNITY): Payer: Self-pay | Admitting: Gastroenterology

## 2014-06-08 DIAGNOSIS — C250 Malignant neoplasm of head of pancreas: Secondary | ICD-10-CM | POA: Insufficient documentation

## 2014-06-08 DIAGNOSIS — C259 Malignant neoplasm of pancreas, unspecified: Secondary | ICD-10-CM

## 2014-06-08 HISTORY — DX: Malignant neoplasm of pancreas, unspecified: C25.9

## 2014-06-08 LAB — GLUCOSE, CAPILLARY
GLUCOSE-CAPILLARY: 175 mg/dL — AB (ref 70–99)
Glucose-Capillary: 185 mg/dL — ABNORMAL HIGH (ref 70–99)
Glucose-Capillary: 208 mg/dL — ABNORMAL HIGH (ref 70–99)

## 2014-06-08 LAB — COMPREHENSIVE METABOLIC PANEL
ALBUMIN: 3.3 g/dL — AB (ref 3.5–5.2)
ALK PHOS: 367 U/L — AB (ref 39–117)
ALT: 251 U/L — ABNORMAL HIGH (ref 0–53)
AST: 124 U/L — ABNORMAL HIGH (ref 0–37)
Anion gap: 15 (ref 5–15)
BILIRUBIN TOTAL: 2.5 mg/dL — AB (ref 0.3–1.2)
BUN: 15 mg/dL (ref 6–23)
CHLORIDE: 100 meq/L (ref 96–112)
CO2: 23 mEq/L (ref 19–32)
Calcium: 9.9 mg/dL (ref 8.4–10.5)
Creatinine, Ser: 1.07 mg/dL (ref 0.50–1.35)
GFR, EST AFRICAN AMERICAN: 79 mL/min — AB (ref >90)
GFR, EST NON AFRICAN AMERICAN: 68 mL/min — AB (ref >90)
GLUCOSE: 182 mg/dL — AB (ref 70–99)
POTASSIUM: 3.7 meq/L (ref 3.7–5.3)
Sodium: 138 mEq/L (ref 137–147)
Total Protein: 6.9 g/dL (ref 6.0–8.3)

## 2014-06-08 MED ORDER — DIPHENHYDRAMINE HCL 25 MG PO CAPS: 25.0000 mg | ORAL_CAPSULE | Freq: Four times a day (QID) | ORAL | Status: DC | PRN

## 2014-06-08 MED ORDER — DIPHENHYDRAMINE HCL 25 MG PO CAPS
25.0000 mg | ORAL_CAPSULE | Freq: Four times a day (QID) | ORAL | Status: DC | PRN
  Administered 2014-06-08: 25 mg via ORAL
  Filled 2014-06-08: qty 1

## 2014-06-08 MED ORDER — DIPHENHYDRAMINE HCL 25 MG PO CAPS
25.0000 mg | ORAL_CAPSULE | Freq: Once | ORAL | Status: AC
  Administered 2014-06-08: 25 mg via ORAL
  Filled 2014-06-08: qty 1

## 2014-06-08 NOTE — Care Management Note (Signed)
    Page 1 of 1   06/08/2014     10:38:37 AM CARE MANAGEMENT NOTE 06/08/2014  Patient:  Evan [name], Evan [name]   Account Number:  [id number]  Date Initiated:  06/08/2014  Documentation initiated by:  [facility]  Subjective/Objective Assessment:   73 Y/O M ADMITTED W/PANCREATIC MASS.     Action/Plan:   FROM HOME.   Anticipated DC Date:  06/08/2014   Anticipated DC Plan:  [place]  CM consult      Choice offered to / List presented to:             Status of service:  Completed, signed off Medicare Important Message given?  NA - LOS <3 / Initial given by admissions (If response is "NO", the following Medicare IM given date fields will be blank) Date Medicare IM given:  06/08/2014 Medicare IM given by:  [facility] Date Additional Medicare IM given:   Additional Medicare IM given by:    Discharge Disposition:  HOME/SELF CARE  Per UR Regulation:  Reviewed for med. necessity/level of care/duration of stay  If discussed at [place] of Stay Meetings, dates discussed:    Comments:  06/08/14 Madalin Hughart RN,BSN NCM [phone] D/C HOME NO [place].

## 2014-06-08 NOTE — Discharge Summary (Signed)
Physician Discharge Summary  Patient ID: Evan [name] MRN: [id number] DOB/AGE: 71-Jul-1942 71 y.o.  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 An MRI of the abdomen showed a 2 x 2 centimeter lesion in the pancreatic head obstructing the common bile duct highly concerning for pancreatic adenocarcinoma. There is moderate intra-and extrahepatic biliary duct dilation. The celiac trunk and superior mesenteric artery were uninvolved. There is no evidence of metastatic adenopathy or liver metastasis. The proximal main portal vein abitted the lesion. On admission the patient's transaminases were elevated 176 and 313, alkaline phosphatase 394, total bilirubin 5.5. The patient was admitted and seen in consultation by oncology, general surgery and gastroenterology. On July 23 the patient underwent endoscopic ultrasound by Dr. Paulita Fujita which showed a 3 x 2 cm ill-defined mass in the head of the pancreas with invasion into the portal vein for approximately 2 cm long segment. The mass did not appear to involve the other mesenteric arteries and there was no neighboring adenopathy, mass was biopsied. The patient then underwent ERCP with placement of a biliary stent was good flow through the stent post procedure. The patient tolerated these procedures well a complication. The patient was seen by general surgery and will have an  upcoming appointment after discharge to discuss the possibility of surgical intervention. He was also seen by Dr. Alvy Bimler oncology. CA19-9 was sent and was elevated at 1234. A CT scan of the chest was done and showed 2 small left lung pulmonary nodules probably benign. The patient's diabetes and hypertension remained under reasonable control. With IV fluids his creatinine dropped to 1.07 by discharge.  Consults: GI, hematology/oncology and general surgery  Significant Diagnostic Studies: labs: At discharge sodium 138 potassium 2.7, chloride 100, carbonate 23, glucose 182, BUN 15, creatinine 1.07 , AST 124, ALT 251, alkaline phosphatase 367, total bilirubin 2.5 radiology: MRI: As above and CT scan: As above, endoscopy: ERCP: As above and endoscopic ultrasound  Treatments: IV hydration and procedures: ERCP with biliary stent placement and endoscopic ultrasound with pancreatic mass biopsy  Discharge Exam: Blood pressure 155/88, pulse 70, temperature 98.2 F (36.8 C), temperature source Oral, resp. rate 16, height 6\' 2"  (1.88 m), weight 113.399 kg (250 lb), SpO2 97.00%. General appearance: alert and cooperative GI: soft, non-tender; bowel sounds normal; no masses,  no organomegaly  Disposition: 01-Home or Self Care     Medication List         acetaminophen 325 MG tablet  Commonly known as:  TYLENOL  Take 650 mg by mouth every 6 (six) hours as needed for moderate pain.     aspirin EC 81 MG tablet  Take 81 mg by mouth daily.     dasatinib 50 MG tablet  Commonly known as:  SPRYCEL  Take 1 tablet (50 mg total) by mouth daily.     fluocinonide cream 0.05 %  Commonly known as:  LIDEX  Apply 1 application topically 2 (two) times daily as needed (rash). As needed     insulin  glargine 100 UNIT/ML injection  Commonly known as:  LANTUS  Inject 35 Units into the skin at bedtime.     losartan 100 MG tablet  Commonly known as:  COZAAR  Take 100 mg by mouth daily.     metFORMIN 1000 MG  tablet  Commonly known as:  GLUCOPHAGE  Take 1,000 mg by mouth 2 (two) times daily with a meal.     ondansetron 8 MG tablet  Commonly known as:  ZOFRAN  Take 1 tablet (8 mg total) by mouth every 8 (eight) hours as needed for nausea.     ONGLYZA 5 MG Tabs tablet  Generic drug:  saxagliptin HCl  Take 5 mg by mouth daily.     simvastatin 20 MG tablet  Commonly known as:  ZOCOR  Take 20 mg by mouth every evening.     SUPER B COMPLEX/VITAMIN C PO  Take 12 tablets by mouth daily.     vitamin B-12 1000 MCG tablet  Commonly known as:  CYANOCOBALAMIN  Take 1,000 mcg by mouth daily.           Follow-up Information   Follow up with [facility], MD In 2 weeks.   Specialty:  General Surgery   Contact information:   [address] [place] [address] [phone]       Signed: [name] 06/08/2014, 5:41 AM

## 2014-06-08 NOTE — Progress Notes (Signed)
Subjective: Some itching and rash on back. No abdominal pain. Tolerating diet.  Objective: Vital signs in last 24 hours: Temp:  [97.5 F (36.4 C)-98.6 F (37 C)] 98.1 F (36.7 C) (07/24 0500) Pulse Rate:  [70-91] 74 (07/24 0500) Resp:  [15-16] 16 (07/24 0500) BP: (124-156)/(56-88) 151/74 mmHg (07/24 0500) SpO2:  [92 %-97 %] 95 % (07/24 0500) Weight change:  Last BM Date: 06/06/14  PE: GEN:  Less jaundiced  Lab Results: CBC    Component Value Date/Time   WBC 5.7 06/06/2014 0500   WBC 5.6 05/29/2014 0912   RBC 4.09* 06/06/2014 0500   RBC 4.17* 05/29/2014 0912   HGB 13.6 06/06/2014 0500   HGB 13.8 05/29/2014 0912   HCT 40.6 06/06/2014 0500   HCT 40.7 05/29/2014 0912   PLT 174 06/06/2014 0500   PLT 150 05/29/2014 0912   MCV 99.3 06/06/2014 0500   MCV 97.6 05/29/2014 0912   MCH 33.3 06/06/2014 0500   MCH 33.1 05/29/2014 0912   MCHC 33.5 06/06/2014 0500   MCHC 33.9 05/29/2014 0912   RDW 15.3 06/06/2014 0500   RDW 14.7* 05/29/2014 0912   LYMPHSABS 1.7 06/05/2014 0005   LYMPHSABS 1.4 05/29/2014 0912   MONOABS 0.7 06/05/2014 0005   MONOABS 0.5 05/29/2014 0912   EOSABS 0.2 06/05/2014 0005   EOSABS 0.1 05/29/2014 0912   BASOSABS 0.0 06/05/2014 0005   BASOSABS 0.0 05/29/2014 0912   CMP     Component Value Date/Time   NA 138 06/08/2014 0437   NA 140 05/29/2014 0912   K 3.7 06/08/2014 0437   K 4.4 05/29/2014 0912   CL 100 06/08/2014 0437   CL 107 04/27/2013 0926   CO2 23 06/08/2014 0437   CO2 23 05/29/2014 0912   GLUCOSE 182* 06/08/2014 0437   GLUCOSE 294* 05/29/2014 0912   GLUCOSE 212* 04/27/2013 0926   BUN 15 06/08/2014 0437   BUN 15.6 05/29/2014 0912   CREATININE 1.07 06/08/2014 0437   CREATININE 1.5* 05/29/2014 0912   CALCIUM 9.9 06/08/2014 0437   CALCIUM 10.5* 05/29/2014 0912   PROT 6.9 06/08/2014 0437   PROT 7.2 05/29/2014 0912   ALBUMIN 3.3* 06/08/2014 0437   ALBUMIN 3.8 05/29/2014 0912   AST 124* 06/08/2014 0437   AST 265 Repeated and Verified* 05/29/2014 0912   ALT 251* 06/08/2014 0437   ALT  405 Repeated and Verified* 05/29/2014 0912   ALKPHOS 367* 06/08/2014 0437   ALKPHOS 269* 05/29/2014 0912   BILITOT 2.5* 06/08/2014 0437   BILITOT 1.25* 05/29/2014 0912   GFRNONAA 68* 06/08/2014 0437   GFRAA 79* 06/08/2014 0437   Assessment:  1.  Obstructive jaundice from pancreatic head mass.  EUS biopsies and ERCP stenting done. 2.  Elevated LFTs, improving after stent placement.  Plan:  1.  OK to be discharged from GI standpoint. 2.  Will need outpatient follow-up with Dr. Barry Dienes; I will discuss case with her.   [place], BEHREND 06/08/2014, 8:34 AM

## 2014-06-08 NOTE — Telephone Encounter (Signed)
Lft msg for pt confirming chemo edu/ov and cld radiation to schedule, mailed scheduled to pt...Marland KitchenMarland KitchenKJ

## 2014-06-08 NOTE — Telephone Encounter (Signed)
Pt called to cancel his 06/21/14 EMG appt. He was diagnosed with pancreatitis cancer this week. He wants to hold off on doing the EMG.

## 2014-06-08 NOTE — Progress Notes (Signed)
Evan [name]   DOB:[birth year]   IE#:[id number]    Subjective: The patient is feeling well. Denies any nausea. He is ready to go home.  Objective:  Filed Vitals:   06/08/14 0500  BP: 151/74  Pulse: 74  Temp: 98.1 F (36.7 C)  Resp: 16     Intake/Output Summary (Last 24 hours) at 06/08/14 0856 Last data filed at 06/08/14 0417  Gross per 24 hour  Intake   2531 ml  Output   1750 ml  Net    781 ml    GENERAL:alert, no distress and comfortable Musculoskeletal:no cyanosis of digits and no clubbing  NEURO: alert & oriented x 3 with fluent speech, no focal motor/sensory deficits   Labs:  Lab Results  Component Value Date   WBC 5.7 06/06/2014   HGB 13.6 06/06/2014   HCT 40.6 06/06/2014   MCV 99.3 06/06/2014   PLT 174 06/06/2014   NEUTROABS 3.2 06/05/2014    Lab Results  Component Value Date   NA 138 06/08/2014   K 3.7 06/08/2014   CL 100 06/08/2014   CO2 23 06/08/2014   Bilirubin has improved to 2.5. Studies:  Dg Ercp  06/07/2014   CLINICAL DATA:  Pancreatic head mass, obstructed jaundice  EXAM: ERCP with stent placement  TECHNIQUE: Multiple spot images obtained with the fluoroscopic device and submitted for interpretation post-procedure.  COMPARISON:  06/05/2014  FINDINGS: Spot fluoroscopic views demonstrate guidewire access of the common bile duct. Limited contrast injection confirms position in the biliary tract. Metallic biliary stent was deployed across the distal CBD obstruction.  IMPRESSION: Distal CBD obstruction.  Successful insertion of a distal CBD stent.  These images were submitted for radiologic interpretation only. Please see the procedural report for the amount of contrast and the fluoroscopy time utilized.   Electronically Signed   By: Daryll Brod M.D.   On: 06/07/2014 13:58   Report from endoscopic ultrasound showed no lymph node involvement however invasion into the portal vein was noted. 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 CT  scan of the chest showed 2 pulmonary nodules, likely benign. CA 19-9 is elevated, suggestive but not diagnostic of pancreatic cancer. Due to portal vein invasion, the patient is considered borderline resectable. I discussed with the patient and his wife briefly about the role of neoadjuvant chemoradiation treatment or neoadjuvant chemotherapy. I will request radiation consultation as soon as possible. I plan to see him back next week in the clinic to discuss about the role of chemotherapy. I plan on requesting port placement, chemotherapy education class and return visit next week. I will get his case presented at the next GI tumor board. #2 obstructive jaundice  He has stent placement with success of reducing the total bilirubin. I will recheck his blood work again next week.  [facility], [place], MD 06/08/2014  8:56 AM

## 2014-06-11 ENCOUNTER — Telehealth: Payer: Self-pay | Admitting: Hematology and Oncology

## 2014-06-11 ENCOUNTER — Telehealth: Payer: Self-pay | Admitting: *Deleted

## 2014-06-11 ENCOUNTER — Encounter: Payer: Self-pay | Admitting: *Deleted

## 2014-06-11 ENCOUNTER — Other Ambulatory Visit: Payer: Self-pay | Admitting: *Deleted

## 2014-06-11 ENCOUNTER — Encounter (HOSPITAL_COMMUNITY): Payer: Self-pay | Admitting: Pharmacy Technician

## 2014-06-11 MED ORDER — PREDNISONE 10 MG PO TABS: ORAL_TABLET | ORAL | Status: DC

## 2014-06-11 NOTE — Telephone Encounter (Signed)
Pt's wife [name] cld to r/s chemo edu class r/s .Marland KitchenMarland KitchenMarland KitchenMarland KitchenKJ

## 2014-06-11 NOTE — Telephone Encounter (Signed)
Pt left a message stating he developed a rash before he left the hospital. Dr Paulita Fujita saw the rash before patient was discharged and felt it was from the cipro patient received during surgery. Pt states rash has now spread to back and buttocks. Has been using benadryl and cream. Can we call in something else to help??

## 2014-06-12 ENCOUNTER — Encounter: Payer: Self-pay | Admitting: *Deleted

## 2014-06-12 ENCOUNTER — Telehealth: Payer: Self-pay | Admitting: *Deleted

## 2014-06-12 NOTE — Progress Notes (Unsigned)
GI Location of Tumor / Histology: Pancreatic head mass with portal vein invasion,   Evan [name] presented   with symptoms of: abdominal pain several weeks,onset jaundice,upper quadrant pain  Biopsies of (if applicable) revealed: Diagnosis 06/07/14: FINE NEEDLE ASPIRATION, ENDOSCOPIC, PANCREAS HEAD(SPECIMEN 1 OF 1 COLLECTED 06/07/14): MALIGNANT CELLS CONSISTENT WITH ADENOCARCINOMA. Preliminary Diagnosis  Intraoperative Diagnosis: Adenocarcinoma ([facility])   Past/Anticipated interventions by surgeon, if any: stent placement ERCP reducing total bilirubin,06/13/14 appt Dr. Barry Dienes and on 06/21/14 pre-op whipple   Past/Anticipated interventions by medical oncology, if any:  Remission  [facility],Dr. Alvy Bimler seen 06/08/14,,referral for  port acath placement, 06/13/14?, and chemo education class 06/15/14,follow up  06/13/14 &12/03/14 with  Dr.Gorsuch  Weight changes, if any:   Bowel/Bladder complaints, if any:  Nausea / Vomiting, if any:   Pain issues, if any: chronic back pain  SAFETY ISSUES:  Prior radiation? No  Pacemaker/ICD? No  Is the patient on methotrexate? No Current Complaints / other details:  Married,  HX CHL,DM II,poorly controlled,peripheral neuropathy OSA, Chronic Pancreatitis,DDD,Anxiety, HTN,basal cell ca. ,spinal stretching, hernia repair,cholecytectomy, colonoscopy, Rt.Ear,hx smoking cigarettes 1.5ppdx 40 years,quit 01/24/2004,no smokeless tobacco,no alcohol or illicit drug use,  Mother MI,Lung Cancer, Father MI,

## 2014-06-12 NOTE — Telephone Encounter (Signed)
Instructed patient to take prednisone 20mg  daily for 7 days for rash

## 2014-06-13 ENCOUNTER — Other Ambulatory Visit: Payer: Self-pay | Admitting: Hematology and Oncology

## 2014-06-13 ENCOUNTER — Other Ambulatory Visit: Payer: Medicare Other

## 2014-06-13 ENCOUNTER — Other Ambulatory Visit (HOSPITAL_COMMUNITY): Payer: Self-pay | Admitting: *Deleted

## 2014-06-13 ENCOUNTER — Encounter: Payer: Self-pay | Admitting: Hematology and Oncology

## 2014-06-13 ENCOUNTER — Ambulatory Visit (HOSPITAL_BASED_OUTPATIENT_CLINIC_OR_DEPARTMENT_OTHER): Payer: Medicare Other | Admitting: Hematology and Oncology

## 2014-06-13 ENCOUNTER — Encounter (HOSPITAL_COMMUNITY): Payer: Self-pay

## 2014-06-13 ENCOUNTER — Other Ambulatory Visit (HOSPITAL_BASED_OUTPATIENT_CLINIC_OR_DEPARTMENT_OTHER): Payer: Medicare Other

## 2014-06-13 ENCOUNTER — Other Ambulatory Visit: Payer: Self-pay

## 2014-06-13 ENCOUNTER — Encounter (HOSPITAL_COMMUNITY)
Admission: RE | Admit: 2014-06-13 | Discharge: 2014-06-13 | Disposition: A | Discharge status: Home / Self Care | Payer: Medicare Other | Source: Ambulatory Visit | Attending: General Surgery | Admitting: General Surgery

## 2014-06-13 VITALS — BP 145/69 | HR 71 | Temp 98.2°F | Resp 20 | Ht 74.0 in | Wt 241.6 lb

## 2014-06-13 DIAGNOSIS — C9211 Chronic myeloid leukemia, BCR/ABL-positive, in remission: Secondary | ICD-10-CM

## 2014-06-13 DIAGNOSIS — Z6831 Body mass index (BMI) 31.0-31.9, adult: Secondary | ICD-10-CM | POA: Diagnosis not present

## 2014-06-13 DIAGNOSIS — E1122 Type 2 diabetes mellitus with diabetic chronic kidney disease: Secondary | ICD-10-CM

## 2014-06-13 DIAGNOSIS — N183 Chronic kidney disease, stage 3 unspecified: Secondary | ICD-10-CM

## 2014-06-13 DIAGNOSIS — R110 Nausea: Secondary | ICD-10-CM

## 2014-06-13 DIAGNOSIS — Z87891 Personal history of nicotine dependence: Secondary | ICD-10-CM | POA: Diagnosis not present

## 2014-06-13 DIAGNOSIS — E1142 Type 2 diabetes mellitus with diabetic polyneuropathy: Secondary | ICD-10-CM

## 2014-06-13 DIAGNOSIS — K219 Gastro-esophageal reflux disease without esophagitis: Secondary | ICD-10-CM | POA: Diagnosis not present

## 2014-06-13 DIAGNOSIS — E7800 Pure hypercholesterolemia, unspecified: Secondary | ICD-10-CM | POA: Diagnosis not present

## 2014-06-13 DIAGNOSIS — C250 Malignant neoplasm of head of pancreas: Secondary | ICD-10-CM | POA: Diagnosis present

## 2014-06-13 DIAGNOSIS — G4733 Obstructive sleep apnea (adult) (pediatric): Secondary | ICD-10-CM | POA: Diagnosis not present

## 2014-06-13 DIAGNOSIS — E1149 Type 2 diabetes mellitus with other diabetic neurological complication: Secondary | ICD-10-CM

## 2014-06-13 DIAGNOSIS — R748 Abnormal levels of other serum enzymes: Secondary | ICD-10-CM

## 2014-06-13 DIAGNOSIS — K861 Other chronic pancreatitis: Secondary | ICD-10-CM | POA: Diagnosis not present

## 2014-06-13 DIAGNOSIS — I10 Essential (primary) hypertension: Secondary | ICD-10-CM | POA: Diagnosis not present

## 2014-06-13 DIAGNOSIS — Z794 Long term (current) use of insulin: Secondary | ICD-10-CM | POA: Diagnosis not present

## 2014-06-13 DIAGNOSIS — K831 Obstruction of bile duct: Secondary | ICD-10-CM | POA: Diagnosis not present

## 2014-06-13 DIAGNOSIS — E1140 Type 2 diabetes mellitus with diabetic neuropathy, unspecified: Secondary | ICD-10-CM

## 2014-06-13 DIAGNOSIS — E669 Obesity, unspecified: Secondary | ICD-10-CM | POA: Diagnosis not present

## 2014-06-13 HISTORY — DX: Nausea: R11.0

## 2014-06-13 LAB — CBC WITH DIFFERENTIAL/PLATELET
BASO%: 1.3 % (ref 0.0–2.0)
Basophils Absolute: 0.1 10*3/uL (ref 0.0–0.1)
EOS%: 3 % (ref 0.0–7.0)
Eosinophils Absolute: 0.2 10*3/uL (ref 0.0–0.5)
HCT: 41.6 % (ref 38.4–49.9)
HGB: 13.8 g/dL (ref 13.0–17.1)
LYMPH%: 25 % (ref 14.0–49.0)
MCH: 33.1 pg (ref 27.2–33.4)
MCHC: 33.3 g/dL (ref 32.0–36.0)
MCV: 99.5 fL — ABNORMAL HIGH (ref 79.3–98.0)
MONO#: 0.8 10*3/uL (ref 0.1–0.9)
MONO%: 10.5 % (ref 0.0–14.0)
NEUT#: 4.3 10*3/uL (ref 1.5–6.5)
NEUT%: 60.2 % (ref 39.0–75.0)
PLATELETS: 245 10*3/uL (ref 140–400)
RBC: 4.18 10*6/uL — ABNORMAL LOW (ref 4.20–5.82)
RDW: 14.3 % (ref 11.0–14.6)
WBC: 7.2 10*3/uL (ref 4.0–10.3)
lymph#: 1.8 10*3/uL (ref 0.9–3.3)

## 2014-06-13 LAB — COMPREHENSIVE METABOLIC PANEL (CC13)
ALK PHOS: 266 U/L — AB (ref 40–150)
ALT: 92 U/L — AB (ref 0–55)
AST: 35 U/L — ABNORMAL HIGH (ref 5–34)
Albumin: 3.5 g/dL (ref 3.5–5.0)
Anion Gap: 9 mEq/L (ref 3–11)
BILIRUBIN TOTAL: 1.3 mg/dL — AB (ref 0.20–1.20)
BUN: 21.7 mg/dL (ref 7.0–26.0)
CO2: 25 mEq/L (ref 22–29)
Calcium: 10.5 mg/dL — ABNORMAL HIGH (ref 8.4–10.4)
Chloride: 103 mEq/L (ref 98–109)
Creatinine: 1.5 mg/dL — ABNORMAL HIGH (ref 0.7–1.3)
Glucose: 342 mg/dl — ABNORMAL HIGH (ref 70–140)
Potassium: 4.3 mEq/L (ref 3.5–5.1)
SODIUM: 138 meq/L (ref 136–145)
TOTAL PROTEIN: 6.9 g/dL (ref 6.4–8.3)

## 2014-06-13 LAB — LACTATE DEHYDROGENASE (CC13): LDH: 147 U/L (ref 125–245)

## 2014-06-13 MED ORDER — HYDROMORPHONE HCL 2 MG PO TABS: 2.0000 mg | ORAL_TABLET | ORAL | Status: DC | PRN

## 2014-06-13 MED ORDER — LIDOCAINE-PRILOCAINE 2.5-2.5 % EX CREA: 1.0000 "application " | TOPICAL_CREAM | CUTANEOUS | Status: DC | PRN

## 2014-06-13 MED ORDER — PROMETHAZINE HCL 25 MG PO TABS: 25.0000 mg | ORAL_TABLET | Freq: Four times a day (QID) | ORAL | Status: DC | PRN

## 2014-06-13 NOTE — Assessment & Plan Note (Signed)
The patient has been in remission for the past year. He will continue taking his chemotherapy through all this treatment.

## 2014-06-13 NOTE — Assessment & Plan Note (Signed)
This is due to obstructive jaundice. It is improving since stent placement.

## 2014-06-13 NOTE — Assessment & Plan Note (Signed)
Have a long discussion with the patient, his wife and daughter. I reviewed the current guidelines and the role of neoadjuvant approach. We discussed about preoperative chemoradiation therapy with weekly infusional gemcitabine. I highly recommend a keep chemotherapy education class to understand some of the side effects to be expected. I will coordinate with the radiation oncologist about the start date of treatment. Approximately 4 weeks after his radiation is completed, will repeat another imaging study and if it shows no evidence of disease progression, we will proceed with surgical resection.

## 2014-06-13 NOTE — Assessment & Plan Note (Signed)
I suspect he may have dehydration. I recommend he increase oral fluid intake.

## 2014-06-13 NOTE — Progress Notes (Signed)
[facility] OFFICE PROGRESS NOTE  Patient Care Team: [name], MD as PCP - General (Internal Medicine) [name], MD as Referring Physician (Specialist) Provider Not In System [name], MD as Consulting Physician (Hematology and Oncology)  SUMMARY OF ONCOLOGIC HISTORY: He was found to have significant and progressive leukocytosis last year and was subsequently referred here for further evaluation. On 08/02/2012, bone marrow aspirate and biopsy show hypocellular marrow with 3% blasts, consistent with chronic phase CML. A followup bone marrow aspirate and biopsy in December 2013 showed that his bone marrow was in normal morphology. His peripheral blood PCR able has improved significantly at 3 months. His last BCR able from June 2014 showed that he is in complete remission with major molecular response. In July 2015, he was noted to have obstructive jaundice and pancreatic mass. 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  INTERVAL HISTORY: Please see below for problem oriented charting. He returns today to discuss about management of pancreatic cancer. He denies further jaundice. He complained of some mild nausea but no vomiting. He has some mild pain after stent placement recently but he has subsequently resolved. He has poor appetite but denies weight loss. He has poorly controlled diabetes. REVIEW OF SYSTEMS:   Constitutional: Denies fevers, chills or abnormal weight loss Eyes: Denies blurriness of vision Ears, nose, mouth, throat, and face: Denies mucositis or sore throat Respiratory: Denies cough, dyspnea or wheezes Cardiovascular: Denies palpitation, chest discomfort or lower extremity swelling Skin: Denies abnormal skin rashes Lymphatics: Denies new lymphadenopathy or easy bruising Neurological:Denies numbness, tingling or new weaknesses Behavioral/Psych: Mood is  stable, no new changes  All other systems were reviewed with the patient and are negative.  I have reviewed the past medical history, past surgical history, social history and family history with the patient and they are unchanged from previous note.  ALLERGIES:  is allergic to ace inhibitors and ciprofloxacin.  MEDICATIONS:  Current Outpatient Prescriptions  Medication Sig Dispense Refill  . acetaminophen (TYLENOL) 325 MG tablet Take 650 mg by mouth every 6 (six) hours as needed for moderate pain.      Marland Kitchen aspirin EC 81 MG tablet Take 81 mg by mouth daily.      . B Complex-C (SUPER B COMPLEX/VITAMIN C PO) Take 1 tablet by mouth daily.       . cholecalciferol (VITAMIN D) 1000 UNITS tablet Take 1,000 Units by mouth daily.      . dasatinib (SPRYCEL) 50 MG tablet Take 50 mg by mouth at bedtime.      . diphenhydrAMINE (BENADRYL) 25 mg capsule Take 1 capsule (25 mg total) by mouth every 6 (six) hours as needed for itching.  30 capsule  0  . hydrocortisone cream 1 % Apply 1 application topically 2 (two) times daily as needed for itching (Rash).      . insulin glargine (LANTUS) 100 UNIT/ML injection Inject 35 Units into the skin at bedtime.       Marland Kitchen losartan (COZAAR) 100 MG tablet Take 100 mg by mouth every morning.       . metFORMIN (GLUCOPHAGE) 1000 MG tablet Take 1,000 mg by mouth 2 (two) times daily with a meal.      . ondansetron (ZOFRAN) 8 MG tablet Take 1 tablet (8 mg total) by mouth every 8 (eight) hours as needed for nausea.  20 tablet  2  . predniSONE (DELTASONE) 10 MG  tablet Take 20 mg by mouth daily with breakfast.      . saxagliptin HCl (ONGLYZA) 5 MG TABS tablet Take 5 mg by mouth daily.      Marland Kitchen senna (SENOKOT) 8.6 MG tablet Take 2 tablets by mouth daily.       . simvastatin (ZOCOR) 20 MG tablet Take 20 mg by mouth every evening.      . vitamin B-12 (CYANOCOBALAMIN) 1000 MCG tablet Take 1,000 mcg by mouth daily.      Marland Kitchen HYDROmorphone (DILAUDID) 2 MG tablet Take 1 tablet (2 mg total) by  mouth every 4 (four) hours as needed for severe pain.  30 tablet  0  . lidocaine-prilocaine (EMLA) cream Apply 1 application topically as needed.  30 g  6  . promethazine (PHENERGAN) 25 MG tablet Take 1 tablet (25 mg total) by mouth every 6 (six) hours as needed for nausea or vomiting.  60 tablet  3   No current facility-administered medications for this visit.    PHYSICAL EXAMINATION: ECOG PERFORMANCE STATUS: 1 - Symptomatic but completely ambulatory  Filed Vitals:   06/13/14 1610  BP: 145/69  Pulse: 71  Temp: 98.2 F (36.8 C)  Resp: 20   Filed Weights   06/13/14 1610  Weight: 241 lb 9.6 oz (109.589 kg)    GENERAL:alert, no distress and comfortable. He is morbidly obese SKIN: skin color, texture, turgor are normal, no rashes or significant lesions EYES: normal, Conjunctiva are pink and non-injected, sclera clear OROPHARYNX:no exudate, no erythema and lips, buccal mucosa, and tongue normal  NECK: supple, thyroid normal size, non-tender, without nodularity LYMPH:  no palpable lymphadenopathy in the cervical, axillary or inguinal LUNGS: clear to auscultation and percussion with normal breathing effort HEART: regular rate & rhythm and no murmurs and no lower extremity edema ABDOMEN:abdomen soft, non-tender and normal bowel sounds. No pain on palpation Musculoskeletal:no cyanosis of digits and no clubbing  NEURO: alert & oriented x 3 with fluent speech, no focal motor/sensory deficits  LABORATORY DATA:  I have reviewed the data as listed    Component Value Date/Time   NA 138 06/13/2014 1551   NA 138 06/08/2014 0437   K 4.3 06/13/2014 1551   K 3.7 06/08/2014 0437   CL 100 06/08/2014 0437   CL 107 04/27/2013 0926   CO2 25 06/13/2014 1551   CO2 23 06/08/2014 0437   GLUCOSE 342* 06/13/2014 1551   GLUCOSE 182* 06/08/2014 0437   GLUCOSE 212* 04/27/2013 0926   BUN 21.7 06/13/2014 1551   BUN 15 06/08/2014 0437   CREATININE 1.5* 06/13/2014 1551   CREATININE 1.07 06/08/2014 0437   CALCIUM  10.5* 06/13/2014 1551   CALCIUM 9.9 06/08/2014 0437   PROT 6.9 06/13/2014 1551   PROT 6.9 06/08/2014 0437   ALBUMIN 3.5 06/13/2014 1551   ALBUMIN 3.3* 06/08/2014 0437   AST 35* 06/13/2014 1551   AST 124* 06/08/2014 0437   ALT 92* 06/13/2014 1551   ALT 251* 06/08/2014 0437   ALKPHOS 266* 06/13/2014 1551   ALKPHOS 367* 06/08/2014 0437   BILITOT 1.30* 06/13/2014 1551   BILITOT 2.5* 06/08/2014 0437   GFRNONAA 68* 06/08/2014 0437   GFRAA 79* 06/08/2014 0437    No results found for this basename: SPEP,  UPEP,   kappa and lambda light chains    Lab Results  Component Value Date   WBC 7.2 06/13/2014   NEUTROABS 4.3 06/13/2014   HGB 13.8 06/13/2014   HCT 41.6 06/13/2014   MCV 99.5* 06/13/2014  PLT 245 06/13/2014      Chemistry      Component Value Date/Time   NA 138 06/13/2014 1551   NA 138 06/08/2014 0437   K 4.3 06/13/2014 1551   K 3.7 06/08/2014 0437   CL 100 06/08/2014 0437   CL 107 04/27/2013 0926   CO2 25 06/13/2014 1551   CO2 23 06/08/2014 0437   BUN 21.7 06/13/2014 1551   BUN 15 06/08/2014 0437   CREATININE 1.5* 06/13/2014 1551   CREATININE 1.07 06/08/2014 0437      Component Value Date/Time   CALCIUM 10.5* 06/13/2014 1551   CALCIUM 9.9 06/08/2014 0437   ALKPHOS 266* 06/13/2014 1551   ALKPHOS 367* 06/08/2014 0437   AST 35* 06/13/2014 1551   AST 124* 06/08/2014 0437   ALT 92* 06/13/2014 1551   ALT 251* 06/08/2014 0437   BILITOT 1.30* 06/13/2014 1551   BILITOT 2.5* 06/08/2014 0437     ASSESSMENT & PLAN:  Pancreatic cancer Have a long discussion with the patient, his wife and daughter. I reviewed the current guidelines and the role of neoadjuvant approach. We discussed about preoperative chemoradiation therapy with weekly infusional gemcitabine. I highly recommend a keep chemotherapy education class to understand some of the side effects to be expected. I will coordinate with the radiation oncologist about the start date of treatment. Approximately 4 weeks after his radiation is completed, will  repeat another imaging study and if it shows no evidence of disease progression, we will proceed with surgical resection.  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 He will continue taking his chemotherapy through all this treatment.   Elevated liver enzymes This is due to obstructive jaundice. It is improving since stent placement.  Nausea alone I have prescribed antiemetics to be taken as needed.  Diabetes mellitus with neuropathy His diabetes remains poorly controlled. I recommend insulin adjustments.  CKD stage 3 due to type 2 diabetes mellitus I suspect he may have dehydration. I recommend he increase oral fluid intake.    No orders of the defined types were placed in this encounter.   All questions were answered. The patient knows to call the clinic with any problems, questions or concerns. No barriers to learning was detected. I spent 40 minutes counseling the patient face to face. The total time spent in the appointment was 60 minutes and more than 50% was on counseling and review of test results     [facility], Thom Ollinger, MD 06/13/2014 5:46 PM

## 2014-06-13 NOTE — Assessment & Plan Note (Signed)
His diabetes remains poorly controlled. I recommend insulin adjustments.

## 2014-06-13 NOTE — Patient Instructions (Addendum)
20     Your procedure is scheduled on:  Thursday 06/14/2014  Report to [facility] Main Entrance and follow signs to Short Stay  At  0700 AM.  Call this number if you have problems the night before or morning of surgery:   [phone]   Remember: TAKE 1/2 DOSE OF LANTUS INSULIN NIGHT BEFORE SURGERY , WHICH WOULD BE 17.5 UNITS, AND TAKE NO DIABETIC MEDICATIONS MORNING OF SURGERY!              IF YOU USE CPAP,BRING MASK AND TUBING AM OF SURGERY!             IF YOU DO NOT HAVE YOUR TYPE AND SCREEN DRAWN AT PRE-ADMIT APPOINTMENT, YOU WILL HAVE IT DRAWN AM OF SURGERY!   Do not eat food or drink liquids AFTER MIDNIGHT!  Take these medicines the morning of surgery with A SIP OF WATER: Prednisone    [facility] IS NOT RESPONSIBLE FOR ANY BELONGINGS OR VALUABLES BROUGHT TO HOSPITAL.  Marland Kitchen  Leave suitcase in the car. After surgery it may be brought to your room.  For patients admitted to the hospital, checkout time is 11:00 AM the day of              Discharge.    DO NOT WEAR  JEWELRY,MAKE-UP,LOTIONS,POWDERS,PERFUMES,CONTACTS , DENTURES OR BRIDGEWORK ,AND DO NOT WEAR FALSE EYELASHES                                    Patients discharged the day of surgery will not be allowed to drive home.   If going home the same day of surgery, must have someone stay with you first 24 hrs.at home and arrange for someone to drive you home from the [place] PT:[email or web address]   Special Instructions:              Please read over the following fact sheets that you were given:             1. [place].Tobin Chad     [phone]                              [facility] - Preparing for Surgery Before surgery, you can play an important role.  Because skin is not sterile, your skin needs to be as free of germs as possible.  You can reduce the number of  germs on your skin by washing with CHG (chlorahexidine gluconate) soap before surgery.  CHG is an antiseptic cleaner which kills germs and bonds with the skin to continue killing germs even after washing. Please DO NOT use if you have an allergy to CHG or antibacterial soaps.  If your skin becomes reddened/irritated stop using the CHG and inform your nurse when you arrive at Short Stay. Do not shave (including legs and underarms) for at least 48  hours prior to the first CHG shower.  You may shave your face/neck. Please follow these instructions carefully:  1.  Shower with CHG Soap the night before surgery and the  morning of Surgery.  2.  If you choose to wash your hair, wash your hair first as usual with your  normal  shampoo.  3.  After you shampoo, rinse your hair and body thoroughly to remove the  shampoo.                           4.  Use CHG as you would any other liquid soap.  You can apply chg directly  to the skin and wash                       Gently with a scrungie or clean washcloth.  5.  Apply the CHG Soap to your body ONLY FROM THE NECK DOWN.   Do not use on face/ open                           Wound or open sores. Avoid contact with eyes, ears mouth and genitals (private parts).                       Wash face,  Genitals (private parts) with your normal soap.             6.  Wash thoroughly, paying special attention to the area where your surgery  will be performed.  7.  Thoroughly rinse your body with warm water from the neck down.  8.  DO NOT shower/wash with your normal soap after using and rinsing off  the CHG Soap.                9.  Pat yourself dry with a clean towel.            10.  Wear clean pajamas.            11.  Place clean sheets on your bed the night of your first shower and do not  sleep with pets. Day of Surgery : Do not apply any lotions/deodorants the morning of surgery.  Please wear clean clothes to the hospital/surgery center.  FAILURE TO FOLLOW THESE  INSTRUCTIONS MAY RESULT IN THE CANCELLATION OF YOUR SURGERY PATIENT SIGNATURE_________________________________  NURSE SIGNATURE__________________________________  ________________________________________________________________________   Evan [name]  An incentive spirometer is a tool that can help keep your lungs clear and active. This tool measures how well you are filling your lungs with each breath. Taking long deep breaths may help reverse or decrease the chance of developing breathing (pulmonary) problems (especially infection) following:  A long period of time when you are unable to move or be active. BEFORE THE PROCEDURE   If the spirometer includes an indicator to show your best effort, your nurse or respiratory therapist will set it to a desired goal.  If possible, sit up straight or lean slightly forward. Try not to slouch.  Hold the incentive spirometer in an upright position. INSTRUCTIONS FOR USE  1. Sit on the edge of your bed if possible, or sit up as far as you can in bed or on a chair. 2. Hold the incentive spirometer in an upright position. 3. Breathe out normally. 4. Place the mouthpiece in your mouth and seal your lips tightly around it. 5. Breathe  in slowly and as deeply as possible, raising the piston or the ball toward the top of the column. 6. Hold your breath for 3-5 seconds or for as long as possible. Allow the piston or ball to fall to the bottom of the column. 7. Remove the mouthpiece from your mouth and breathe out normally. 8. Rest for a few seconds and repeat Steps 1 through 7 at least 10 times every 1-2 hours when you are awake. Take your time and take a few normal breaths between deep breaths. 9. The spirometer may include an indicator to show your best effort. Use the indicator as a goal to work toward during each repetition. 10. After each set of 10 deep breaths, practice coughing to be sure your lungs are clear. If you have an incision (the  cut made at the time of surgery), support your incision when coughing by placing a pillow or rolled up towels firmly against it. Once you are able to get out of bed, walk around indoors and cough well. You may stop using the incentive spirometer when instructed by your caregiver.  RISKS AND COMPLICATIONS  Take your time so you do not get dizzy or light-headed.  If you are in pain, you may need to take or ask for pain medication before doing incentive spirometry. It is harder to take a deep breath if you are having pain. AFTER USE  Rest and breathe slowly and easily.  It can be helpful to keep track of a log of your progress. Your caregiver can provide you with a simple table to help with this. If you are using the spirometer at home, follow these instructions: [place] IF:   You are having difficultly using the spirometer.  You have trouble using the spirometer as often as instructed.  Your pain medication is not giving enough relief while using the spirometer.  You develop fever of 100.5 F (38.1 C) or higher. SEEK IMMEDIATE MEDICAL CARE IF:   You cough up bloody sputum that had not been present before.  You develop fever of 102 F (38.9 C) or greater.  You develop worsening pain at or near the incision site. MAKE SURE YOU:   Understand these instructions.  Will watch your condition.  Will get help right away if you are not doing well or get worse. Document Released: 03/15/2007 Document Revised: 01/25/2012 Document Reviewed: 05/16/2007 [facility] Patient Information 2014 [place], [place].   ________________________________________________________________________

## 2014-06-13 NOTE — Assessment & Plan Note (Signed)
I have prescribed antiemetics to be taken as needed.

## 2014-06-13 NOTE — ED Provider Notes (Signed)
CSN: [id number]     Arrival date & time 06/04/14  2350 History   First MD Initiated Contact with Patient 06/05/14 0257     Chief Complaint  Patient presents with  . Jaundice  . Abdominal Pain     (Consider location/radiation/quality/duration/timing/severity/associated sxs/prior Treatment) HPI  Very Pleasant 73 y/o male with 1 week of gradual onset of jaundice and occasional abd pain Gradually worsening,  Now moderate No n/v/f/c.   Nothing makes better or worse Told to come to the ED if sx worsen - increased jaundice tonight  Past Medical History  Diagnosis Date  . Chronic back pain greater than 3 months duration   . Bulging discs   . Diabetes mellitus     type II; neuropathy;   . Hypertension   . High cholesterol   . Leukocytosis   . GERD (gastroesophageal reflux disease)   . Pancreatitis 2004  . OSA (obstructive sleep apnea) 2010  . Anxiety   . DDD (degenerative disc disease) 2005    lumbar spine  . History of smoking 08/01/2012  . Weight loss 08/01/2012  . Cough 08/01/2012  . 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 Left leg pain 08/05/2012  . 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 Chronic pancreatitis   . Pancreatic cancer 06/08/2014   Past Surgical History  Procedure Laterality Date  . Cholecystectomy      laparoscopic  . Hernia repair      umbilical  . Skin cancer removed  2012    right ear, basal cell carcinoma.   Marland Kitchen Spinal stretching    . Colonoscopy  2012    [facility]  . Eus Left 06/07/2014    Procedure: UPPER ENDOSCOPIC ULTRASOUND (EUS) LINEAR;  Surgeon: Arta Silence, MD;  Location: [facility];  Service: Endoscopy;  Laterality: Left;  . Ercp N/A 06/07/2014    Procedure: ENDOSCOPIC RETROGRADE CHOLANGIOPANCREATOGRAPHY (ERCP);  Surgeon: Arta Silence, MD;  Location: [facility];  Service: Endoscopy;  Laterality: N/A;   Family History  Problem Relation Age of Onset  . Heart attack Mother   . Cancer Mother 57    lung  . Heart attack Father   . Stroke Father     History  Substance Use Topics  . Smoking status: Former Smoker -- 1.50 packs/day for 40 years    Quit date: 01/24/2004  . Smokeless tobacco: Never Used  . Alcohol Use: No    Review of Systems  All other systems reviewed and are negative.     Allergies  Ciprofloxacin  Home Medications   Prior to Admission medications   Medication Sig Start Date End Date Taking? Authorizing Provider  acetaminophen (TYLENOL) 325 MG tablet Take 650 mg by mouth every 6 (six) hours as needed for moderate pain.   Yes Historical Provider, MD  aspirin EC 81 MG tablet Take 81 mg by mouth daily.   Yes Historical Provider, MD  B Complex-C (SUPER B COMPLEX/VITAMIN C PO) Take 1 tablet by mouth daily.    Yes Historical Provider, MD  dasatinib (SPRYCEL) 50 MG tablet Take 1 tablet (50 mg total) by mouth daily. 01/15/14  Yes [name], MD  insulin glargine (LANTUS) 100 UNIT/ML injection Inject 35 Units into the skin at bedtime.    Yes [name], MD  losartan (COZAAR) 100 MG tablet Take 100 mg by mouth every morning.    Yes Historical Provider, MD  metFORMIN (GLUCOPHAGE) 1000 MG tablet Take 1,000 mg by mouth 2 (two) times daily with a meal.  Yes Historical Provider, MD  ondansetron (ZOFRAN) 8 MG tablet Take 1 tablet (8 mg total) by mouth every 8 (eight) hours as needed for nausea. 09/05/12  Yes Maryanna Shape, NP  saxagliptin HCl (ONGLYZA) 5 MG TABS tablet Take 5 mg by mouth daily.   Yes Historical Provider, MD  simvastatin (ZOCOR) 20 MG tablet Take 20 mg by mouth every evening.   Yes Historical Provider, MD  vitamin B-12 (CYANOCOBALAMIN) 1000 MCG tablet Take 1,000 mcg by mouth daily.   Yes Historical Provider, MD  cholecalciferol (VITAMIN D) 1000 UNITS tablet Take 1,000 Units by mouth daily.    Historical Provider, MD  diphenhydrAMINE (BENADRYL) 25 mg capsule Take 1 capsule (25 mg total) by mouth every 6 (six) hours as needed for itching. 06/08/14   [name], MD  hydrocortisone cream 1 %  Apply 1 application topically 2 (two) times daily as needed for itching (Rash).    Historical Provider, MD  predniSONE (DELTASONE) 10 MG tablet Take 20 mg by mouth daily with breakfast.    Historical Provider, MD  senna (SENOKOT) 8.6 MG tablet Take 1 tablet by mouth 2 (two) times daily.    Historical Provider, MD   BP 151/74  Pulse 74  Temp(Src) 98.1 F (36.7 C) (Oral)  Resp 16  Ht 6\' 2"  (1.88 m)  Wt 250 lb (113.399 kg)  BMI 32.08 kg/m2  SpO2 95% Physical Exam  Nursing note and vitals reviewed. Constitutional: He appears well-developed and well-nourished. No distress.  HENT:  Head: Normocephalic and atraumatic.  Mouth/Throat: Oropharynx is clear and moist. No oropharyngeal exudate.  Eyes: Conjunctivae and EOM are normal. Pupils are equal, round, and reactive to light. Right eye exhibits no discharge. Left eye exhibits no discharge. Scleral icterus is present.  Neck: Normal range of motion. Neck supple. No JVD present. No thyromegaly present.  Cardiovascular: Normal rate, regular rhythm, normal heart sounds and intact distal pulses.  Exam reveals no gallop and no friction rub.   No murmur heard. Pulmonary/Chest: Effort normal and breath sounds normal. No respiratory distress. He has no wheezes. He has no rales.  Abdominal: Soft. Bowel sounds are normal. He exhibits no distension and no mass. There is no tenderness.  Non tender abd, no HSM  Musculoskeletal: Normal range of motion. He exhibits no edema and no tenderness.  Lymphadenopathy:    He has no cervical adenopathy.  Neurological: He is alert. Coordination normal.  Skin: Skin is warm and dry. No rash noted. No erythema.  Psychiatric: He has a normal mood and affect. His behavior is normal.    ED Course  Procedures (including critical care time) Labs Review Labs Reviewed  COMPREHENSIVE METABOLIC PANEL - Abnormal; Notable for the following:    Glucose, Bld 333 (*)    Creatinine, Ser 1.47 (*)    Calcium 10.6 (*)    AST 176  (*)    ALT 313 (*)    Alkaline Phosphatase 394 (*)    Total Bilirubin 5.5 (*)    GFR calc non Af Amer 46 (*)    GFR calc Af Amer 54 (*)    All other components within normal limits  LIPASE, BLOOD - Abnormal; Notable for the following:    Lipase 172 (*)    All other components within normal limits  CANCER ANTIGEN 19-9 - Abnormal; Notable for the following:    CA 19-9 1234.0 (*)    All other components within normal limits  GLUCOSE, CAPILLARY - Abnormal; Notable for the following:  Glucose-Capillary 209 (*)    All other components within normal limits  COMPREHENSIVE METABOLIC PANEL - Abnormal; Notable for the following:    Glucose, Bld 178 (*)    Albumin 3.4 (*)    AST 184 (*)    ALT 279 (*)    Alkaline Phosphatase 385 (*)    Total Bilirubin 5.4 (*)    GFR calc non Af Amer 69 (*)    GFR calc Af Amer 80 (*)    All other components within normal limits  CBC - Abnormal; Notable for the following:    RBC 4.09 (*)    All other components within normal limits  HEMOGLOBIN A1C - Abnormal; Notable for the following:    Hemoglobin A1C 7.5 (*)    Mean Plasma Glucose 169 (*)    All other components within normal limits  GLUCOSE, CAPILLARY - Abnormal; Notable for the following:    Glucose-Capillary 293 (*)    All other components within normal limits  GLUCOSE, CAPILLARY - Abnormal; Notable for the following:    Glucose-Capillary 235 (*)    All other components within normal limits  GLUCOSE, CAPILLARY - Abnormal; Notable for the following:    Glucose-Capillary 186 (*)    All other components within normal limits  GLUCOSE, CAPILLARY - Abnormal; Notable for the following:    Glucose-Capillary 164 (*)    All other components within normal limits  GLUCOSE, CAPILLARY - Abnormal; Notable for the following:    Glucose-Capillary 192 (*)    All other components within normal limits  GLUCOSE, CAPILLARY - Abnormal; Notable for the following:    Glucose-Capillary 237 (*)    All other  components within normal limits  BASIC METABOLIC PANEL - Abnormal; Notable for the following:    Glucose, Bld 140 (*)    GFR calc non Af Amer 81 (*)    All other components within normal limits  GLUCOSE, CAPILLARY - Abnormal; Notable for the following:    Glucose-Capillary 348 (*)    All other components within normal limits  GLUCOSE, CAPILLARY - Abnormal; Notable for the following:    Glucose-Capillary 287 (*)    All other components within normal limits  GLUCOSE, CAPILLARY - Abnormal; Notable for the following:    Glucose-Capillary 165 (*)    All other components within normal limits  GLUCOSE, CAPILLARY - Abnormal; Notable for the following:    Glucose-Capillary 127 (*)    All other components within normal limits  GLUCOSE, CAPILLARY - Abnormal; Notable for the following:    Glucose-Capillary 161 (*)    All other components within normal limits  GLUCOSE, CAPILLARY - Abnormal; Notable for the following:    Glucose-Capillary 189 (*)    All other components within normal limits  GLUCOSE, CAPILLARY - Abnormal; Notable for the following:    Glucose-Capillary 168 (*)    All other components within normal limits  COMPREHENSIVE METABOLIC PANEL - Abnormal; Notable for the following:    Glucose, Bld 182 (*)    Albumin 3.3 (*)    AST 124 (*)    ALT 251 (*)    Alkaline Phosphatase 367 (*)    Total Bilirubin 2.5 (*)    GFR calc non Af Amer 68 (*)    GFR calc Af Amer 79 (*)    All other components within normal limits  GLUCOSE, CAPILLARY - Abnormal; Notable for the following:    Glucose-Capillary 205 (*)    All other components within normal limits  GLUCOSE, CAPILLARY - Abnormal;  Notable for the following:    Glucose-Capillary 208 (*)    All other components within normal limits  GLUCOSE, CAPILLARY - Abnormal; Notable for the following:    Glucose-Capillary 185 (*)    All other components within normal limits  GLUCOSE, CAPILLARY - Abnormal; Notable for the following:     Glucose-Capillary 175 (*)    All other components within normal limits  CBC WITH DIFFERENTIAL  PROTIME-INR  PROTIME-INR  CYTOLOGY - NON PAP    Imaging Review No results found.    MDM   Final diagnoses:  Pancreatic mass  Abnormal liver function test    The pt is jaundiced, corrleates with increased Bilirubin.  The pt is not ill appearing - was scheduled for outpt MRI this AM but instead will keep at this time as there is a high likelihood that pt will need admission to hospital - suspect biliary mass / pancreatic mass or other biliary obstruction.   D/w pt and family as well as with the radiologist who agrees that MRI would be best next test and not to do CT.  D/w oncoming EDP who will f/u on MRI      Johnna Acosta, MD 06/13/14 [phone]

## 2014-06-14 ENCOUNTER — Encounter (HOSPITAL_COMMUNITY)
Admission: RE | Disposition: A | Discharge status: Home / Self Care | Payer: Self-pay | Source: Ambulatory Visit | Attending: General Surgery

## 2014-06-14 ENCOUNTER — Other Ambulatory Visit: Payer: Self-pay | Admitting: Hematology and Oncology

## 2014-06-14 ENCOUNTER — Ambulatory Visit (HOSPITAL_COMMUNITY)
Admission: RE | Admit: 2014-06-14 | Discharge: 2014-06-14 | Disposition: A | Discharge status: Home / Self Care | Payer: Medicare Other | Source: Ambulatory Visit | Attending: General Surgery | Admitting: General Surgery

## 2014-06-14 ENCOUNTER — Ambulatory Visit (HOSPITAL_COMMUNITY): Payer: Medicare Other

## 2014-06-14 ENCOUNTER — Encounter (HOSPITAL_COMMUNITY): Payer: Medicare Other | Admitting: Registered Nurse

## 2014-06-14 ENCOUNTER — Ambulatory Visit (HOSPITAL_COMMUNITY): Payer: Medicare Other | Admitting: Registered Nurse

## 2014-06-14 ENCOUNTER — Encounter (HOSPITAL_COMMUNITY): Payer: Self-pay | Admitting: *Deleted

## 2014-06-14 DIAGNOSIS — E1149 Type 2 diabetes mellitus with other diabetic neurological complication: Secondary | ICD-10-CM | POA: Insufficient documentation

## 2014-06-14 DIAGNOSIS — C250 Malignant neoplasm of head of pancreas: Secondary | ICD-10-CM | POA: Insufficient documentation

## 2014-06-14 DIAGNOSIS — K831 Obstruction of bile duct: Secondary | ICD-10-CM | POA: Insufficient documentation

## 2014-06-14 DIAGNOSIS — E669 Obesity, unspecified: Secondary | ICD-10-CM | POA: Insufficient documentation

## 2014-06-14 DIAGNOSIS — Z794 Long term (current) use of insulin: Secondary | ICD-10-CM | POA: Insufficient documentation

## 2014-06-14 DIAGNOSIS — E1142 Type 2 diabetes mellitus with diabetic polyneuropathy: Secondary | ICD-10-CM | POA: Insufficient documentation

## 2014-06-14 DIAGNOSIS — G4733 Obstructive sleep apnea (adult) (pediatric): Secondary | ICD-10-CM | POA: Insufficient documentation

## 2014-06-14 DIAGNOSIS — Z87891 Personal history of nicotine dependence: Secondary | ICD-10-CM | POA: Insufficient documentation

## 2014-06-14 DIAGNOSIS — K861 Other chronic pancreatitis: Secondary | ICD-10-CM | POA: Diagnosis not present

## 2014-06-14 DIAGNOSIS — K219 Gastro-esophageal reflux disease without esophagitis: Secondary | ICD-10-CM | POA: Insufficient documentation

## 2014-06-14 DIAGNOSIS — I10 Essential (primary) hypertension: Secondary | ICD-10-CM | POA: Insufficient documentation

## 2014-06-14 DIAGNOSIS — E7800 Pure hypercholesterolemia, unspecified: Secondary | ICD-10-CM | POA: Insufficient documentation

## 2014-06-14 DIAGNOSIS — C259 Malignant neoplasm of pancreas, unspecified: Secondary | ICD-10-CM

## 2014-06-14 DIAGNOSIS — Z6831 Body mass index (BMI) 31.0-31.9, adult: Secondary | ICD-10-CM | POA: Insufficient documentation

## 2014-06-14 DIAGNOSIS — C9211 Chronic myeloid leukemia, BCR/ABL-positive, in remission: Secondary | ICD-10-CM | POA: Insufficient documentation

## 2014-06-14 HISTORY — PX: OTHER SURGICAL HISTORY: SHX169

## 2014-06-14 HISTORY — PX: PORTACATH PLACEMENT: SHX2246

## 2014-06-14 LAB — GLUCOSE, CAPILLARY
GLUCOSE-CAPILLARY: 149 mg/dL — AB (ref 70–99)
Glucose-Capillary: 162 mg/dL — ABNORMAL HIGH (ref 70–99)

## 2014-06-14 SURGERY — INSERTION, TUNNELED CENTRAL VENOUS DEVICE, WITH PORT
Anesthesia: General | Site: Chest | Laterality: Left

## 2014-06-14 MED ORDER — LIDOCAINE HCL (CARDIAC) 10 MG/ML IV SOLN
INTRAVENOUS | Status: DC | PRN
  Administered 2014-06-14: 100 mg via INTRAVENOUS

## 2014-06-14 MED ORDER — HYDROCODONE-ACETAMINOPHEN 5-325 MG PO TABS: 1.0000 | ORAL_TABLET | ORAL | Status: DC | PRN

## 2014-06-14 MED ORDER — OXYCODONE HCL 5 MG/5ML PO SOLN
5.0000 mg | Freq: Once | ORAL | Status: DC | PRN
  Filled 2014-06-14: qty 5

## 2014-06-14 MED ORDER — PROMETHAZINE HCL 25 MG/ML IJ SOLN: 6.2500 mg | INTRAMUSCULAR | Status: DC | PRN

## 2014-06-14 MED ORDER — OXYCODONE HCL 5 MG PO TABS: 5.0000 mg | ORAL_TABLET | Freq: Once | ORAL | Status: DC | PRN

## 2014-06-14 MED ORDER — PROPOFOL 10 MG/ML IV BOLUS
INTRAVENOUS | Status: DC | PRN
Start: 2014-06-14 — End: 2014-06-14
  Administered 2014-06-14: 200 mg via INTRAVENOUS

## 2014-06-14 MED ORDER — CEFAZOLIN SODIUM-DEXTROSE 2-3 GM-% IV SOLR
INTRAVENOUS | Status: AC
  Filled 2014-06-14: qty 50

## 2014-06-14 MED ORDER — FENTANYL CITRATE 0.05 MG/ML IJ SOLN
INTRAMUSCULAR | Status: AC
  Filled 2014-06-14: qty 5

## 2014-06-14 MED ORDER — ONDANSETRON HCL 4 MG/2ML IJ SOLN
INTRAMUSCULAR | Status: DC | PRN
  Administered 2014-06-14: 4 mg via INTRAVENOUS

## 2014-06-14 MED ORDER — CEFAZOLIN SODIUM-DEXTROSE 2-3 GM-% IV SOLR
2.0000 g | INTRAVENOUS | Status: AC
  Administered 2014-06-14: 2 g via INTRAVENOUS

## 2014-06-14 MED ORDER — LACTATED RINGERS IV SOLN
INTRAVENOUS | Status: DC | PRN
  Administered 2014-06-14 (×2): via INTRAVENOUS

## 2014-06-14 MED ORDER — PROPOFOL 10 MG/ML IV BOLUS
INTRAVENOUS | Status: AC
  Filled 2014-06-14: qty 20

## 2014-06-14 MED ORDER — LACTATED RINGERS IV SOLN
INTRAVENOUS | Status: DC
  Administered 2014-06-14: 1000 mL via INTRAVENOUS

## 2014-06-14 MED ORDER — HEPARIN SOD (PORK) LOCK FLUSH 100 UNIT/ML IV SOLN
INTRAVENOUS | Status: DC | PRN
  Administered 2014-06-14: 500 [IU]

## 2014-06-14 MED ORDER — MIDAZOLAM HCL 5 MG/5ML IJ SOLN
INTRAMUSCULAR | Status: DC | PRN
  Administered 2014-06-14: 2 mg via INTRAVENOUS

## 2014-06-14 MED ORDER — MEPERIDINE HCL 50 MG/ML IJ SOLN: 6.2500 mg | INTRAMUSCULAR | Status: DC | PRN

## 2014-06-14 MED ORDER — MIDAZOLAM HCL 2 MG/2ML IJ SOLN
INTRAMUSCULAR | Status: AC
  Filled 2014-06-14: qty 2

## 2014-06-14 MED ORDER — BUPIVACAINE-EPINEPHRINE 0.25% -1:200000 IJ SOLN
INTRAMUSCULAR | Status: DC | PRN
  Administered 2014-06-14: 6 mL

## 2014-06-14 MED ORDER — FENTANYL CITRATE 0.05 MG/ML IJ SOLN
INTRAMUSCULAR | Status: DC | PRN
  Administered 2014-06-14: 50 ug via INTRAVENOUS
  Administered 2014-06-14: 100 ug via INTRAVENOUS

## 2014-06-14 MED ORDER — HYDROMORPHONE HCL PF 1 MG/ML IJ SOLN
INTRAMUSCULAR | Status: AC
  Filled 2014-06-14: qty 1

## 2014-06-14 MED ORDER — LIDOCAINE HCL 1 % IJ SOLN
INTRAMUSCULAR | Status: DC | PRN
  Administered 2014-06-14: 6 mL

## 2014-06-14 MED ORDER — HYDROMORPHONE HCL PF 1 MG/ML IJ SOLN
0.2500 mg | INTRAMUSCULAR | Status: DC | PRN
  Administered 2014-06-14: 0.5 mg via INTRAVENOUS

## 2014-06-14 MED ORDER — SODIUM CHLORIDE 0.9 % IR SOLN
Freq: Once | Status: AC
  Administered 2014-06-14: 10:00:00
  Filled 2014-06-14: qty 1.2

## 2014-06-14 SURGICAL SUPPLY — 33 items
BAG DECANTER FOR FLEXI CONT (MISCELLANEOUS) ×3 IMPLANT
BLADE HEX COATED 2.75 (ELECTRODE) ×3 IMPLANT
BLADE SURG 15 STRL LF DISP TIS (BLADE) ×1 IMPLANT
BLADE SURG 15 STRL SS (BLADE) ×2
BLADE SURG SZ11 CARB STEEL (BLADE) ×3 IMPLANT
CHLORAPREP W/TINT 26ML (MISCELLANEOUS) ×3 IMPLANT
DECANTER SPIKE VIAL GLASS SM (MISCELLANEOUS) ×3 IMPLANT
DERMABOND ADVANCED (GAUZE/BANDAGES/DRESSINGS) ×2
DERMABOND ADVANCED .7 DNX12 (GAUZE/BANDAGES/DRESSINGS) ×1 IMPLANT
DRAPE C-ARM 42X120 X-RAY (DRAPES) ×3 IMPLANT
DRAPE LAPAROTOMY TRNSV 102X78 (DRAPE) ×3 IMPLANT
DRAPE UTILITY XL STRL (DRAPES) ×3 IMPLANT
ELECT REM PT RETURN 9FT ADLT (ELECTROSURGICAL) ×3
ELECTRODE REM PT RTRN 9FT ADLT (ELECTROSURGICAL) ×1 IMPLANT
GAUZE SPONGE 4X4 16PLY XRAY LF (GAUZE/BANDAGES/DRESSINGS) ×3 IMPLANT
GLOVE BIO SURGEON STRL SZ 6 (GLOVE) ×3 IMPLANT
GLOVE INDICATOR 6.5 STRL GRN (GLOVE) ×3 IMPLANT
GOWN STRL REUS W/TWL 2XL LVL3 (GOWN DISPOSABLE) ×3 IMPLANT
GOWN STRL REUS W/TWL XL LVL3 (GOWN DISPOSABLE) ×3 IMPLANT
KIT BASIN OR (CUSTOM PROCEDURE TRAY) ×3 IMPLANT
KIT PORT POWER 8FR ISP CVUE (Catheter) ×3 IMPLANT
NEEDLE HYPO 22GX1.5 SAFETY (NEEDLE) ×3 IMPLANT
PACK BASIC VI WITH GOWN DISP (CUSTOM PROCEDURE TRAY) ×3 IMPLANT
PENCIL BUTTON HOLSTER BLD 10FT (ELECTRODE) ×3 IMPLANT
SUT MNCRL AB 4-0 PS2 18 (SUTURE) ×3 IMPLANT
SUT PROLENE 2 0 SH DA (SUTURE) ×6 IMPLANT
SUT VIC AB 3-0 SH 27 (SUTURE) ×2
SUT VIC AB 3-0 SH 27X BRD (SUTURE) ×1 IMPLANT
SYR CONTROL 10ML LL (SYRINGE) ×3 IMPLANT
SYRINGE 10CC LL (SYRINGE) ×6 IMPLANT
TOWEL OR 17X26 10 PK STRL BLUE (TOWEL DISPOSABLE) ×3 IMPLANT
TOWEL OR NON WOVEN STRL DISP B (DISPOSABLE) ×3 IMPLANT
YANKAUER SUCT BULB TIP 10FT TU (MISCELLANEOUS) IMPLANT

## 2014-06-14 NOTE — Op Note (Signed)
PREOPERATIVE DIAGNOSIS:  Pancreatic cancer     POSTOPERATIVE DIAGNOSIS:  Same     PROCEDURE: left subclavian port placement, Bard ClearVue  Power Port, MRI safe, 8-French.      SURGEON:  Stark Klein, MD      ANESTHESIA:  General   FINDINGS:  Good venous return, easy flush, and tip of the catheter and   SVC 26.5 cm.      SPECIMEN:  None.      ESTIMATED BLOOD LOSS:  Minimal.      COMPLICATIONS:  None known.      PROCEDURE:  Pt was identified in the holding area and taken to   the operating room, where patient was placed supine on the operating room   table.  General anesthesia was induced.  Patient's arms were tucked and the upper   chest and neck were prepped and draped in sterile fashion.  Time-out was   performed according to the surgical safety check list.  When all was   correct, we continued.   Local anesthetic was administered over this   area at the angle of the clavicle.  The vein was accessed with 1 pass of the needle, however, the wire went into the neck.  The subclavian vein was accessed again with 1 stick at a different angle, and this time the wire passed easily. There were also a few PVCs, so the wire was retracted a bit.  There was good venous return.   Fluoroscopy was used to confirm that the wire was in the vena cava.      The patient was placed back level and the area for the pocket was anethetized   with local anesthetic.  A 3-cm transverse incision was made with a #15   blade.  Cautery was used to divide the subcutaneous tissues down to the   pectoralis muscle.  An [employer] retractor was used to elevate the skin   while a pocket was created on top of the pectoralis fascia.  The port   was placed into the pocket to confirm that it was of adequate size.  The   catheter was preattached to the port.  The port was then secured to the   pectoralis fascia with four 2-0 Prolene sutures.  These were clamped and   not tied down yet.    The catheter was tunneled  through to the wire exit   site.  The catheter was placed along the wire to determine what length it should be to be in the SVC.  The catheter was cut at 26.5 cm.  The tunneler sheath and dilator were passed over the wire and the dilator and wire were removed.  The catheter was advanced through the tunneler sheath and the tunneler sheath was pulled away.  Care was taken to keep the catheter in the tunneler sheath as this occurred. This was advanced and the tunneler sheath was removed.  There was good venous   return and easy flush of the catheter.  The Prolene sutures were tied   down to the pectoral fascia.  The skin was reapproximated using 3-0   Vicryl interrupted deep dermal sutures.    Fluoroscopy was used to re-confirm good position of the catheter.  The skin   was then closed using 4-0 Monocryl in a subcuticular fashion.  The port was flushed with concentrated heparin flush as well.  The wounds were then cleaned, dried, and dressed with Dermabond.  The patient was awakened from anesthesia and taken  to the PACU in stable condition.  Needle, sponge, and instrument counts were correct.               Stark Klein, MD

## 2014-06-14 NOTE — Anesthesia Preprocedure Evaluation (Addendum)
Anesthesia Evaluation  Patient identified by MRN, date of birth, ID band Patient awake  General Assessment Comment:  Chronic back pain greater than 3 months duration     .  Bulging discs     .  Diabetes mellitus         type II; neuropathy;    .  Hypertension     .  High cholesterol     .  Leukocytosis     .  GERD (gastroesophageal reflux disease)     .  Pancreatitis  2004   .  OSA (obstructive sleep apnea)  2010   .  Anxiety     .  DDD (degenerative disc disease)  2005       lumbar spine   .  History of smoking  08/01/2012   .  Weight loss  08/01/2012   .  Cough  08/01/2012   .  CML (chronic myelocytic leukemia)  08/05/2012   .  Left leg pain  08/05/2012   .  CML in remission  01/15/2014   .  Chronic pancreatitis           Reviewed: Allergy & Precautions, H&P , NPO status , Patient's Chart, lab work & pertinent test results  Airway Mallampati: II TM Distance: >3 FB Neck ROM: Full    Dental no notable dental hx.    Pulmonary sleep apnea , former smoker,  breath sounds clear to auscultation  Pulmonary exam normal       Cardiovascular Exercise Tolerance: Good hypertension, Pt. on medications negative cardio ROS  Rhythm:Regular Rate:Normal     Neuro/Psych Anxiety negative neurological ROS     GI/Hepatic Neg liver ROS, GERD-  Medicated,  Endo/Other  diabetes, Type 2, Insulin Dependent, Oral Hypoglycemic Agents  Renal/GU Renal disease  negative genitourinary   Musculoskeletal negative musculoskeletal ROS (+)   Abdominal (+) + obese,   Peds negative pediatric ROS (+)  Hematology negative hematology ROS (+)   Anesthesia Other Findings   Reproductive/Obstetrics negative OB ROS                          Anesthesia Physical  Anesthesia Plan  ASA: III  Anesthesia Plan: General and MAC   Post-op Pain Management:    Induction: Intravenous  Airway Management Planned: LMA and Simple Face  Mask  Additional Equipment:   Intra-op Plan:   Post-operative Plan: Extubation in OR  Informed Consent: I have reviewed the patients History and Physical, chart, labs and discussed the procedure including the risks, benefits and alternatives for the proposed anesthesia with the patient or authorized representative who has indicated his/her understanding and acceptance.   Dental advisory given  Plan Discussed with: CRNA  Anesthesia Plan Comments:        Anesthesia Quick Evaluation

## 2014-06-14 NOTE — Discharge Instructions (Signed)
[facility] Office Phone Number [phone]   POST OP INSTRUCTIONS  Always review your discharge instruction sheet given to you by the facility where your surgery was performed.  IF YOU HAVE DISABILITY OR FAMILY LEAVE FORMS, YOU MUST BRING THEM TO THE OFFICE FOR PROCESSING.  DO NOT GIVE THEM TO YOUR DOCTOR.  1. A prescription for pain medication may be given to you upon discharge.  Take your pain medication as prescribed, if needed.  If narcotic pain medicine is not needed, then you may take acetaminophen (Tylenol) or ibuprofen (Advil) as needed. 2. Take your usually prescribed medications unless otherwise directed 3. If you need a refill on your pain medication, please contact your pharmacy.  They will contact our office to request authorization.  Prescriptions will not be filled after 5pm or on week-ends. 4. You should eat very light the first 24 hours after surgery, such as soup, crackers, pudding, etc.  Resume your normal diet the day after surgery 5. It is common to experience some constipation if taking pain medication after surgery.  Increasing fluid intake and taking a stool softener will usually help or prevent this problem from occurring.  A mild laxative (Milk of Magnesia or Miralax) should be taken according to package directions if there are no bowel movements after 48 hours. 6. You may shower in 48 hours.  The surgical glue will flake off in 2-3 weeks.   7. ACTIVITIES:  No strenuous activity or heavy lifting for 1 week.   a. You may drive when you no longer are taking prescription pain medication, you can comfortably wear a seatbelt, and you can safely maneuver your car and apply brakes. b. RETURN TO WORK:  __________n/a_______________ You should see your doctor in the office for a follow-up appointment approximately three-four weeks after your surgery.    WHEN TO CALL YOUR DOCTOR: 1. Fever over 101.0 2. Nausea and/or vomiting. 3. Extreme swelling or  bruising. 4. Continued bleeding from incision. 5. Increased pain, redness, or drainage from the incision.  The clinic staff is available to answer your questions during regular business hours.  Please don't hesitate to call and ask to speak to one of the nurses for clinical concerns.  If you have a medical emergency, go to the nearest emergency room or call 911.  A surgeon from [facility] is always on call at the hospital.  For further questions, please visit [email or web address]      

## 2014-06-14 NOTE — Interval H&P Note (Signed)
History and Physical Interval Note:  06/14/2014 8:59 AM  Evan [name]  has presented today for surgery, with the diagnosis of pancreatic cancer  The various methods of treatment have been discussed with the patient and family. After consideration of risks, benefits and other options for treatment, the patient has consented to  Procedure(s): INSERTION PORT-A-CATH (N/A) as a surgical intervention .  The patient's history has been reviewed, patient examined, no change in status, stable for surgery.  I have reviewed the patient's chart and labs.  Questions were answered to the patient's satisfaction.     Evan [name]

## 2014-06-14 NOTE — Progress Notes (Signed)
X-ray results noted 

## 2014-06-14 NOTE — Progress Notes (Signed)
Portable upright Chest X-ray done. 

## 2014-06-14 NOTE — Anesthesia Postprocedure Evaluation (Signed)
Anesthesia Post Note  Patient: Evan [name]  Procedure(s) Performed: Procedure(s) (LRB): INSERTION PORT-A-CATH (Left)  Anesthesia type: General  Patient location: PACU  Post pain: Pain level controlled  Post assessment: Post-op Vital signs reviewed  Last Vitals: BP 126/68  Pulse 65  Temp(Src) 36.2 C (Oral)  Resp 16  Ht 6\' 2"  (1.88 m)  Wt 241 lb 9.6 oz (109.589 kg)  BMI 31.01 kg/m2  SpO2 95%  Post vital signs: Reviewed  Level of consciousness: sedated  Complications: No apparent anesthesia complications

## 2014-06-14 NOTE — H&P (View-Only) (Signed)
Delayed entry.  Pt seen 7/22.  Subjective: Pt is feeling a bit better than admission.    Objective: Vital signs in last 24 hours: Temp:  [98 F (36.7 C)-98.3 F (36.8 C)] 98.1 F (36.7 C) (07/23 0911) Pulse Rate:  [67-86] 86 (07/23 0911) Resp:  [14-18] 16 (07/23 0911) BP: (139-156)/(66-80) 156/77 mmHg (07/23 0911) SpO2:  [94 %-96 %] 95 % (07/23 0911) Last BM Date: 06/06/14  Intake/Output from previous day: 07/22 0701 - 07/23 0700 In: 480 [P.O.:480] Out: 200 [Urine:200] Intake/Output this shift:    General appearance: alert, cooperative and no distress Resp: breathing comfortably GI: soft, non-tender; bowel sounds normal; no masses,  no organomegaly  Lab Results:   Recent Labs  06/05/14 0005 06/06/14 0500  WBC 5.7 5.7  HGB 14.3 13.6  HCT 42.0 40.6  PLT 187 174   BMET  Recent Labs  06/06/14 0500 06/07/14 0404  NA 137 137  K 4.1 3.9  CL 102 101  CO2 21 23  GLUCOSE 178* 140*  BUN 13 15  CREATININE 1.05 0.98  CALCIUM 10.1 10.5   PT/INR  Recent Labs  06/05/14 1355 06/06/14 0500  LABPROT 11.8 12.6  INR 0.87 0.94   ABG No results found for this basename: PHART, PCO2, PO2, HCO3,  in the last 72 hours  Studies/Results: Ct Chest Wo Contrast  06/06/2014   CLINICAL DATA:  Potential pancreatic adenocarcinoma.  Staging  EXAM: CT CHEST WITHOUT CONTRAST  TECHNIQUE: Multidetector CT imaging of the chest was performed following the standard protocol without IV contrast.  COMPARISON:  MRI abdomen 721 1,015  FINDINGS: There is a rounded nodule along the superior aspect of the left oblique fissure measuring 7 mm (image 21, series 5). 5 mm noncalcified pulmonary nodule at the left lower lobe (image 38, series 5). There is bibasilar linear atelectasis / scarring. Centrilobular emphysema noted in the upper lobes.  No axillary or supraclavicular lymphadenopathy. Mild symmetric gynecomastia. No mediastinal lymphadenopathy. Coronary calcifications are noted. No pericardial  fluid. Esophagus is normal.  Upper abdominal abnormalities concerning for pancreatic carcinoma described on MRI of 06/05/2014.  Review of the bone windows demonstrates no aggressive osseous lesion.  IMPRESSION: 1. Two small left lung pulmonary nodule are probably benign. Recommend attention on follow-up. 2. Centrilobular emphysema.   Electronically Signed   By: Suzy Bouchard M.D.   On: 06/06/2014 08:21    Anti-infectives: Anti-infectives   Start     Dose/Rate Route Frequency Ordered Stop   06/07/14 0915  ciprofloxacin (CIPRO) IVPB 400 mg     400 mg 200 mL/hr over 60 Minutes Intravenous  Once 06/07/14 3016        Assessment/Plan:  Malignant neoplasm of head of pancreas Pt appears to have pancreatic cancer in head/uncinate process.  There is no evidence of metastatic disease.  He does not have any absolute contraindications to surgery, but I do think his body habitus and overall health may lend to a difficult recovery.  I discussed risks and benefits of surgery.  I reviewed the recovery in hospital and out of hospital.  I discussed the likelihood that he would need more treatment.  I also advised of the risk of recurrence after surgery, and the possibility of finding metastatic disease.    He is getting his EUS/ERCP tomorrow.  We will hopefully have a biopsy at that point. I will follow him up in clinic.  I advised him to bring his family and questions to the visit.  That way, he can make  informed decision whether or not to undergo surgery.     I also discussed the possibility of a diagnostic laparoscopy as a separate procedure.  This way we can factor out metastatic disease.      LOS: 2 days    [facility] 06/07/2014

## 2014-06-14 NOTE — Transfer of Care (Signed)
Immediate Anesthesia Transfer of Care Note  Patient: Evan [name]  Procedure(s) Performed: Procedure(s): INSERTION PORT-A-CATH (Left)  Patient Location: PACU  Anesthesia Type:General  Level of Consciousness: awake, alert , oriented and patient cooperative  Airway & Oxygen Therapy: Patient Spontanous Breathing and Patient connected to face mask oxygen  Post-op Assessment: Report given to PACU RN, Post -op Vital signs reviewed and stable and Patient moving all extremities X 4  Post vital signs: stable  Complications: No apparent anesthesia complications

## 2014-06-15 ENCOUNTER — Ambulatory Visit
Admission: RE | Admit: 2014-06-15 | Discharge: 2014-06-15 | Disposition: A | Discharge status: Home / Self Care | Payer: Medicare Other | Source: Ambulatory Visit | Attending: Radiation Oncology | Admitting: Radiation Oncology

## 2014-06-15 ENCOUNTER — Encounter: Payer: Self-pay | Admitting: *Deleted

## 2014-06-15 ENCOUNTER — Encounter: Payer: Self-pay | Admitting: Radiation Oncology

## 2014-06-15 ENCOUNTER — Other Ambulatory Visit: Payer: Self-pay | Admitting: Hematology and Oncology

## 2014-06-15 ENCOUNTER — Telehealth: Payer: Self-pay | Admitting: Hematology and Oncology

## 2014-06-15 ENCOUNTER — Other Ambulatory Visit: Payer: Medicare Other

## 2014-06-15 VITALS — BP 148/67 | HR 96 | Temp 97.8°F | Resp 20 | Ht 74.0 in | Wt 241.5 lb

## 2014-06-15 DIAGNOSIS — Z7982 Long term (current) use of aspirin: Secondary | ICD-10-CM | POA: Insufficient documentation

## 2014-06-15 DIAGNOSIS — E7800 Pure hypercholesterolemia, unspecified: Secondary | ICD-10-CM | POA: Diagnosis not present

## 2014-06-15 DIAGNOSIS — Z9089 Acquired absence of other organs: Secondary | ICD-10-CM | POA: Insufficient documentation

## 2014-06-15 DIAGNOSIS — E119 Type 2 diabetes mellitus without complications: Secondary | ICD-10-CM | POA: Insufficient documentation

## 2014-06-15 DIAGNOSIS — Z856 Personal history of leukemia: Secondary | ICD-10-CM | POA: Insufficient documentation

## 2014-06-15 DIAGNOSIS — Z87891 Personal history of nicotine dependence: Secondary | ICD-10-CM | POA: Diagnosis not present

## 2014-06-15 DIAGNOSIS — C250 Malignant neoplasm of head of pancreas: Secondary | ICD-10-CM | POA: Insufficient documentation

## 2014-06-15 DIAGNOSIS — M549 Dorsalgia, unspecified: Secondary | ICD-10-CM | POA: Insufficient documentation

## 2014-06-15 DIAGNOSIS — IMO0002 Reserved for concepts with insufficient information to code with codable children: Secondary | ICD-10-CM | POA: Diagnosis not present

## 2014-06-15 DIAGNOSIS — I10 Essential (primary) hypertension: Secondary | ICD-10-CM | POA: Diagnosis not present

## 2014-06-15 DIAGNOSIS — Z794 Long term (current) use of insulin: Secondary | ICD-10-CM | POA: Diagnosis not present

## 2014-06-15 DIAGNOSIS — Z510 Encounter for antineoplastic radiation therapy: Secondary | ICD-10-CM | POA: Diagnosis not present

## 2014-06-15 DIAGNOSIS — G8929 Other chronic pain: Secondary | ICD-10-CM | POA: Diagnosis not present

## 2014-06-15 HISTORY — DX: Allergy, unspecified, initial encounter: T78.40XA

## 2014-06-15 NOTE — Telephone Encounter (Signed)
gv and printed appt sched and avs for pt for Aug....sed added tx. °

## 2014-06-15 NOTE — Progress Notes (Unsigned)
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  Preliminary Diagnosis Intraoperative Diagnosis: Adenocarcinoma (CRR)   Past/Anticipated interventions by surgeon, if any: stent placement ERCP reducing total bilirubin,06/13/14 appt Dr. Barry Dienes and on 06/21/14 pre-op whipple   Past/Anticipated interventions by medical oncology, if any: Remission CHL,Dr. Alvy Bimler seen 06/08/14,,referral for port acath placement, 06/13/14?, and chemo education class 06/15/14,follow up 06/13/14 &12/03/14 with Dr.Gorsuch   Weight changes, if any: loss appetite  Bowel/Bladder complaints, if any: constipation,   Nausea / Vomiting, if any: no,   Pain issues, if any: chronic back pain , disomfort left subclavian recent port a cath placement recently SAFETY ISSUES:  Prior radiation? No  Pacemaker/ICD? No  Is the patient on methotrexate? No Current Complaints / other details: Married, HX CHL,DM II,poorly controlled,peripheral neuropathy OSA, Chronic Pancreatitis,DDD,Anxiety, HTN,basal cell ca. ,spinal stretching, hernia repair,cholecytectomy, colonoscopy, Rt.Ear,hx smoking cigarettes 1.5ppdx 40 years,quit 01/24/2004,no smokeless tobacco,no alcohol or illicit drug use,  Mother MI,Lung Cancer, Father MI,

## 2014-06-15 NOTE — Progress Notes (Signed)
Please see the Nurse Progress Note in the MD Initial Consult Encounter for this patient. 

## 2014-06-18 ENCOUNTER — Ambulatory Visit: Payer: Medicare Other

## 2014-06-18 ENCOUNTER — Ambulatory Visit: Payer: Medicare Other | Admitting: Radiation Oncology

## 2014-06-18 NOTE — Progress Notes (Signed)
Radiation Oncology         [phone]) [phone] ________________________________  Name: Evan [name] MRN: [id number]  Date: 06/15/2014  DOB: Jun 05, 1941  KD:[id number],KNLZ Broadus Jadalyn Oliveri, MD  [name], MD   Stark Klein, MD  REFERRING PHYSICIAN: [name], MD   DIAGNOSIS: The encounter diagnosis was Malignant neoplasm of head of pancreas.   HISTORY OF PRESENT ILLNESS::Evan [name] is a 72 y.o. male who is seen for an initial consultation visit. The patient has a notable history of CML. As part of routine followup for this the patient was noted to have severe abnormal liver function tests. The patient presented to the emergency department and an ultrasound was remarkable for biliary dilatation. An MRI scan of the abdomen was performed . This revealed a 2.2 cm lesion within the pancreatic head causing obstruction of the common bile duct and the pancreatic duct. This was concerning for pancreatic adenocarcinoma. The lesion contacted the main portal vein over 2 cm. No celiac trunk or superior mesenteric artery involvement was seen. There is no evidence for liver metastasis.   The patient had a stent placed and his liver function tests have been improving.  The patient proceeded with endoscopic ultrasound with biopsy which returned positive for adenocarcinoma of the pancreas. This has been staged as a T3, N0, M0 tumor. The patient's case has been discussed in multidisciplinary GI conference and this is felt to be borderline resectable do to involvement of the portal vein.  The patient has been seen by medical oncology and I have been asked to see the patient for consideration of possible radiation treatment. At this time, the patient states that he is doing fairly well. He has chronic back pain but otherwise no pain in the abdominal region or elsewhere.   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     PAST SURGICAL HISTORY: Past Surgical History  Procedure Laterality Date  . Cholecystectomy      laparoscopic  . Hernia repair      umbilical  . Skin cancer removed  2012    right ear, basal cell carcinoma.   Marland Kitchen Spinal stretching    . Colonoscopy  2012    [facility]  . Eus Left 06/07/2014    Procedure: UPPER ENDOSCOPIC ULTRASOUND (EUS) LINEAR;  Surgeon: Arta Silence, MD;  Location: [facility] ENDOSCOPY;  Service: Endoscopy;  Laterality: Left;  . Ercp N/A 06/07/2014    Procedure: ENDOSCOPIC RETROGRADE CHOLANGIOPANCREATOGRAPHY (ERCP);  Surgeon: Arta Silence, MD;  Location: [facility];  Service: Endoscopy;  Laterality: N/A;  . Eye surgery      bilateral cataracts with lens implant  . Cataract extraction Bilateral   . Port acath placement Left 06/14/14  . Portacath placement Left 06/14/2014    Procedure: INSERTION PORT-A-CATH;  Surgeon: Stark Klein, MD;  Location: [facility] ORS;  Service: General;  Laterality: Left;     FAMILY HISTORY: family history includes Cancer (age of onset: 79) in his mother; Heart attack in his father and mother; Stroke in his father.   SOCIAL HISTORY:  reports that he quit smoking about  10 years ago. He has never used smokeless tobacco. He reports that he does not drink alcohol or use illicit drugs.   ALLERGIES: Ace inhibitors and Ciprofloxacin   MEDICATIONS:  Current Outpatient Prescriptions  Medication Sig Dispense Refill  . acetaminophen (TYLENOL) 325 MG tablet Take 650 mg by mouth every  6 (six) hours as needed for moderate pain.      Marland Kitchen aspirin EC 81 MG tablet Take 81 mg by mouth daily.      . B Complex-C (SUPER B COMPLEX/VITAMIN C PO) Take 1 tablet by mouth daily.       . cholecalciferol (VITAMIN D) 1000 UNITS tablet Take 1,000 Units by mouth daily.      . dasatinib (SPRYCEL) 50 MG tablet Take 50 mg by mouth at bedtime.      . diphenhydrAMINE (BENADRYL) 25 mg capsule Take 1 capsule (25 mg total) by mouth every 6 (six) hours as needed for itching.  30 capsule  0  . HYDROcodone-acetaminophen (NORCO/VICODIN) 5-325 MG per tablet Take 1 tablet by mouth every 4 (four) hours as needed for moderate pain or severe pain.  20 tablet  0  . hydrocortisone cream 1 % Apply 1 application topically 2 (two) times daily as needed for itching (Rash).      Marland Kitchen HYDROmorphone (DILAUDID) 2 MG tablet Take 1 tablet (2 mg total) by mouth every 4 (four) hours as needed for severe pain.  30 tablet  0  . insulin glargine (LANTUS) 100 UNIT/ML injection Inject 35 Units into the skin at bedtime.       . lidocaine-prilocaine (EMLA) cream Apply 1 application topically as needed.  30 g  6  . losartan (COZAAR) 100 MG tablet Take 100 mg by mouth every morning.       . metFORMIN (GLUCOPHAGE) 1000 MG tablet Take 1,000 mg by mouth 2 (two) times daily with a meal.      . ondansetron (ZOFRAN) 8 MG tablet Take 1 tablet (8 mg total) by mouth every 8 (eight) hours as needed for nausea.  20 tablet  2  . predniSONE (DELTASONE) 10 MG tablet Take 20 mg by mouth daily with breakfast.      . promethazine (PHENERGAN) 25 MG tablet Take 1 tablet (25 mg total) by mouth every 6 (six) hours as needed for nausea or vomiting.  60 tablet  3  . saxagliptin HCl (ONGLYZA) 5 MG TABS tablet Take 5 mg by mouth daily.      Marland Kitchen senna (SENOKOT) 8.6 MG tablet Take 2 tablets by mouth daily.       . simvastatin (ZOCOR) 20 MG tablet Take 20 mg by mouth every evening.      . vitamin B-12 (CYANOCOBALAMIN) 1000 MCG tablet Take 1,000 mcg by mouth daily.        No current facility-administered medications for this encounter.     REVIEW OF SYSTEMS:  A 15 point review of systems is documented in the electronic medical record. This was obtained by the nursing staff. However, I reviewed this with the patient to discuss relevant findings and make appropriate changes.  Pertinent items are noted in HPI.    PHYSICAL EXAM:  height is 6\' 2"  (1.88 m) and weight is 241 lb 8 oz (109.544 kg). His oral temperature is 97.8 F (36.6 C). His blood pressure is 148/67 and his pulse is 96. His respiration is 20 and oxygen saturation is 98%.   ECOG = 1  0 - Asymptomatic (Fully active, able to carry  on all predisease activities without restriction)  1 - Symptomatic but completely ambulatory (Restricted in physically strenuous activity but ambulatory and able to carry out work of a light or sedentary nature. For example, light housework, office work)  2 - Symptomatic, <50% in bed during the day (Ambulatory and capable of all self care but unable to carry out any work activities. Up and about more than 50% of waking hours)  3 - Symptomatic, >50% in bed, but not bedbound (Capable of only limited self-care, confined to bed or chair 50% or more of waking hours)  4 - Bedbound (Completely disabled. Cannot carry on any self-care. Totally confined to bed or chair)  5 - Death   Eustace Pen MM, Creech RH, Tormey DC, et al. [phone]). "Toxicity and response criteria of the [facility]". [place] Oncol. 5 (6): 649-55  General: Well-developed, in no acute distress HEENT: Normocephalic, atraumatic; oral cavity clear Neck: Supple without any lymphadenopathy Cardiovascular: Regular rate and rhythm Respiratory: Clear to auscultation bilaterally GI: Soft, nontender, normal bowel sounds Extremities: No edema present Neuro: No focal deficits     LABORATORY DATA:  Lab Results  Component Value Date   WBC 7.2 06/13/2014   HGB 13.8 06/13/2014   HCT 41.6  06/13/2014   MCV 99.5* 06/13/2014   PLT 245 06/13/2014   Lab Results  Component Value Date   NA 138 06/13/2014   K 4.3 06/13/2014   CL 100 06/08/2014   CO2 25 06/13/2014   Lab Results  Component Value Date   ALT 92* 06/13/2014   AST 35* 06/13/2014   ALKPHOS 266* 06/13/2014   BILITOT 1.30* 06/13/2014      RADIOGRAPHY: Dg Eye Foreign Body  06/04/2014   CLINICAL DATA:  Metal working/exposure; clearance prior to MRI  EXAM: ORBITS FOR FOREIGN BODY - 2 VIEW  COMPARISON:  None.  FINDINGS: There is no evidence of metallic foreign body within the orbits. No significant bone abnormality identified.  IMPRESSION: No evidence of metallic foreign body within the orbits.   Electronically Signed   By: Rozetta Nunnery M.D.   On: 06/04/2014 11:28   Ct Chest Wo Contrast  06/06/2014   CLINICAL DATA:  Potential pancreatic adenocarcinoma.  Staging  EXAM: CT CHEST WITHOUT CONTRAST  TECHNIQUE: Multidetector CT imaging of the chest was performed following the standard protocol without IV contrast.  COMPARISON:  MRI abdomen 721 1,015  FINDINGS: There is a rounded nodule along the superior aspect of the left oblique fissure measuring 7 mm (image 21, series 5). 5 mm noncalcified pulmonary nodule at the left lower lobe (image 38, series 5). There is bibasilar linear atelectasis / scarring. Centrilobular emphysema noted in the upper lobes.  No axillary or supraclavicular lymphadenopathy. Mild symmetric gynecomastia. No mediastinal lymphadenopathy. Coronary calcifications are noted. No pericardial fluid. Esophagus is normal.  Upper abdominal abnormalities concerning for pancreatic carcinoma described on MRI of 06/05/2014.  Review of the bone windows demonstrates no aggressive osseous lesion.  IMPRESSION: 1. Two small left lung pulmonary nodule are probably benign. Recommend attention on follow-up. 2. Centrilobular emphysema.   Electronically Signed   By: Suzy Bouchard M.D.   On: 06/06/2014 08:21   Mr Abdomen W Wo  Contrast  06/05/2014   CLINICAL DATA:  Jaundice, elevated LFTs  EXAM: MRI ABDOMEN WITHOUT AND WITH CONTRAST  TECHNIQUE: Multiplanar multisequence MR imaging of the abdomen was performed both before and after the administration of intravenous contrast.  CONTRAST:  66mL MULTIHANCE GADOBENATE DIMEGLUMINE 529 MG/ML  IV SOLN  COMPARISON:  Ultrasound 05/29/2014  FINDINGS: There is a moderate intrahepatic biliary duct dilatation and moderate to severe extrahepatic biliary ductal dilatation. The common bile duct and common hepatic duct measure up to 11 mm. There is abrupt tapering of the common bile duct as it enters the pancreatic head as seen on coronal series 2, image 11. Additionally there is dilatation of the pancreatic duct and atrophy of the tail. There is abrupt narrowing of the pancreatic duct as it enters the genu region of the pancreas as seen on coronal series 3, image 26. This so-called "double duct sign" is consistent with a obstructing pancreatic lesion. On the T1 weighted pre and post-contrast images there is a hypo intense lesion in the head of the pancreas at the level of the ductal obstruction measuring 2.2 x 2.2 cm in axial dimension (image 66 series 102). This is most concerning for a pancreatic adenocarcinoma.  This likely adenocarcinoma abuts the most proximal aspect of the main portal vein along the ventral surface for approximately 2 cm. The celiac trunk in its trifurcation vessels as well as the superior mesenteric artery are not involved by the pancreatic lesion. There is a cystic lesion the pancreas proximal to the ductal obstruction measuring 2.4 cm.  There is no significant periportal peripancreatic lymphadenopathy. There is no focal enhancing hepatic lesion. The spleen the splenic vein appears normal. The left adrenal glands and is normal. There is a nodule measuring 11 mm extending from the right adrenal gland which demonstrates loss of signal intensity on the opposed phase imaging consistent  with a small adrenal adenoma. Several nonenhancing cysts within the cortex of the right kidney.  No aggressive osseous lesion.  Lung bases appear clear.  IMPRESSION: 1. 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 2. Moderate intra and extrahepatic biliary duct dilatation. Recommend endoscopic ultrasound for tissue sampling as well as relief of the biliary obstruction. 3. Celiac trunk and trifurcation as well as the superior mesenteric artery are uninvolved by the pancreatic lesion. 4. Proximal main portal vein abuts the lesion over 2 cm. 5. No evidence of metastatic adenopathy. 6. No evidence of liver metastasis. Findings conveyed to Rolland Porter, MD on 06/05/2014  at09:06.   Electronically Signed   By: Suzy Bouchard M.D.   On: 06/05/2014 09:07   US Abdomen Complete  05/29/2014   CLINICAL DATA:  Elevated LFTs. Post cholecystectomy. History of leukemia.  EXAM: ULTRASOUND ABDOMEN COMPLETE  COMPARISON:  No comparison ultrasound.  Prior CT 09/09/2010.  FINDINGS: Gallbladder:  Post cholecystectomy.  Common bile duct:  Diameter: Enlarged measuring 10.3 mm. Distal aspect not visualized secondary to bowel gas.  Liver:  Mild prominence intrahepatic biliary ducts left lobe liver. Heterogeneous echotexture without dominant mass identified  IVC:  Limited evaluation secondary to bowel gas and habitus.  Pancreas:  Limited evaluation secondary to habitus and bowel gas.  Spleen:  10.6 cm in length without dominant mass.  Right Kidney:  Length: 12.5 cm. Mild upper pole caliectasis. Mild renal parenchymal thinning.  Left Kidney:  Length: 12.1 cm. Mild renal parenchymal thinning without hydronephrosis or mass identified.  Abdominal aorta:  Mid to distal aspect not visualized secondary to bowel gas. Proximal aspect measures up to 2.7 cm.  Other findings:  None.  IMPRESSION: Post cholecystectomy with prominent size common bile duct  measuring up to 10.3 mm. Also suspect left lobe liver intrahepatic biliary duct prominence.  Secondary to habitus and bowel gas, limited evaluation of the inferior vena cava, pancreas and mid to distal aspect of the aorta.  Mild caliectasis upper pole right kidney. Mild bilateral renal parenchymal thinning.   Electronically Signed   By: Chauncey Cruel M.D.   On: 05/29/2014 17:09   Dg Chest Port 1 View  06/14/2014   CLINICAL DATA:  Port-A-Cath placement.  EXAM: PORTABLE CHEST - 1 VIEW  COMPARISON:  CT chest 06/06/2014.  FINDINGS: A left subclavian Port-A-Cath has been placed. The tip is in the mid SVC, well above the cavoatrial junction.  Heart size is within normal limits. Mild pulmonary vascular congestion is evident. No focal airspace consolidation is present. There is no pneumothorax. The visualized soft tissues and bony thorax are unremarkable.  IMPRESSION: 1. Interval placement of left subclavian Port-A-Cath. The tip is in the mid SVC, well above the cavoatrial junction. And 2. Mild pulmonary vascular congestion with low lung volumes.   Electronically Signed   By: Lawrence Santiago M.D.   On: 06/14/2014 10:37   Dg Ercp  06/07/2014   CLINICAL DATA:  Pancreatic head mass, obstructed jaundice  EXAM: ERCP with stent placement  TECHNIQUE: Multiple spot images obtained with the fluoroscopic device and submitted for interpretation post-procedure.  COMPARISON:  06/05/2014  FINDINGS: Spot fluoroscopic views demonstrate guidewire access of the common bile duct. Limited contrast injection confirms position in the biliary tract. Metallic biliary stent was deployed across the distal CBD obstruction.  IMPRESSION: Distal CBD obstruction.  Successful insertion of a distal CBD stent.  These images were submitted for radiologic interpretation only. Please see the procedural report for the amount of contrast and the fluoroscopy time utilized.   Electronically Signed   By: Daryll Brod M.D.   On: 06/07/2014 13:58   Dg C-arm  1-60 Min-no Report  06/14/2014   CLINICAL DATA: placement of port-a-cath   C-ARM 1-60 MINUTES  Fluoroscopy was utilized by the requesting physician.  No radiographic  interpretation.        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 Notably, the patient also has a history of CML.  The patient's case has been discussed in multidisciplinary GI conference. The consensus was that it was appropriate to proceed with neoadjuvant chemoradiation treatment followed by restaging studies with consideration of resectability. I discussed with the patient the rationale of radiation treatment in particular. I discussed the logistics of treatment which I would anticipate would represent 5-1/2-6 weeks. We also discussed the possible side effects and risks of treatment. All of his questions were answered.     PLAN: The patient will undergo simulation later today such that we can proceed with treatment planning. I anticipate beginning the patient's radiation treatment on 06/25/2014. This will consist of concurrent chemotherapy therapy through Dr. Alvy Bimler.    I spent 60 minutes face to face with the patient and more than 50% of that time was spent in counseling and/or coordination of care.    ________________________________   Jodelle Gross, MD, PhD   **Disclaimer: This note was dictated with voice recognition software. Similar sounding words can inadvertently be transcribed and this note may contain transcription errors which may not have been corrected upon publication of note.**

## 2014-06-18 NOTE — Progress Notes (Signed)
Received confirmation from [place] that the prescription for Sprycel 50 mg tabs was processed and shipped to patient for delivery on 06-19-14.

## 2014-06-20 ENCOUNTER — Encounter: Payer: Self-pay | Admitting: Specialist

## 2014-06-20 NOTE — Progress Notes (Signed)
Phone support. Spoke with spouse. She talked about how patient had "good days and bad days" and today was not a good day. I offered support and helped her identify her support communities. The patient's church, [employer], is providing ongoing support for emotional and physical needs. She also said that their neighbors have been helping them so she felt well supported. She said this second diagnosis was a shock that they are still adjusting to.  Will continue to follow as necessary.  Evan Gore, PhD, [place]

## 2014-06-21 ENCOUNTER — Telehealth: Payer: Self-pay | Admitting: *Deleted

## 2014-06-21 ENCOUNTER — Other Ambulatory Visit: Payer: Self-pay | Admitting: *Deleted

## 2014-06-21 ENCOUNTER — Institutional Professional Consult (permissible substitution): Payer: Medicare Other | Admitting: Radiation Oncology

## 2014-06-21 ENCOUNTER — Encounter: Payer: Medicare Other | Admitting: Neurology

## 2014-06-21 ENCOUNTER — Encounter (INDEPENDENT_AMBULATORY_CARE_PROVIDER_SITE_OTHER): Payer: Medicare Other | Admitting: General Surgery

## 2014-06-21 DIAGNOSIS — Z510 Encounter for antineoplastic radiation therapy: Secondary | ICD-10-CM | POA: Diagnosis not present

## 2014-06-21 MED ORDER — LACTULOSE 10 GM/15ML PO SOLN: 10.0000 g | Freq: Three times a day (TID) | ORAL | Status: DC | PRN

## 2014-06-21 NOTE — Telephone Encounter (Signed)
Sennokot 2 tabs every 3 hours until BM Miralax daily Dulcolax suppository 1 daily Lactulose 15 ml TID PO (you can call in)

## 2014-06-21 NOTE — Telephone Encounter (Signed)
Instructed wife on Dr. Calton Dach instructions below.  Lactulose sent to [facility].  Instructed her to call us back if he does not have BM by tomorrow morning.  She verbalized understanding.

## 2014-06-21 NOTE — Telephone Encounter (Signed)
Wife reports pt has not had BM in one week, since he was d/c'd from Hospital.  He took 5 Senokot one day, then Miralax the next day, Ex-lax the day after that and then yesterday drank one whole bottle of Magnesium Citrate.  He is very uncomfortable.  Asks what Dr. Alvy Bimler recommends next?

## 2014-06-22 ENCOUNTER — Other Ambulatory Visit: Payer: Medicare Other

## 2014-06-22 ENCOUNTER — Telehealth: Payer: Self-pay | Admitting: Neurology

## 2014-06-22 DIAGNOSIS — Z510 Encounter for antineoplastic radiation therapy: Secondary | ICD-10-CM | POA: Diagnosis not present

## 2014-06-22 NOTE — Telephone Encounter (Signed)
Pt called on 06/08/14 to cancel his EMG on 06/21/14.  Pt was diagnosed with pancreatitis cancer. He does not want to proceed with the EMG.

## 2014-06-25 ENCOUNTER — Ambulatory Visit (HOSPITAL_BASED_OUTPATIENT_CLINIC_OR_DEPARTMENT_OTHER): Payer: Medicare Other | Admitting: Hematology and Oncology

## 2014-06-25 ENCOUNTER — Ambulatory Visit (HOSPITAL_BASED_OUTPATIENT_CLINIC_OR_DEPARTMENT_OTHER): Payer: Medicare Other

## 2014-06-25 ENCOUNTER — Telehealth: Payer: Self-pay | Admitting: Hematology and Oncology

## 2014-06-25 ENCOUNTER — Other Ambulatory Visit: Payer: Self-pay | Admitting: Hematology and Oncology

## 2014-06-25 ENCOUNTER — Ambulatory Visit
Admission: RE | Admit: 2014-06-25 | Discharge: 2014-06-25 | Disposition: A | Discharge status: Home / Self Care | Payer: Medicare Other | Source: Ambulatory Visit | Attending: Radiation Oncology | Admitting: Radiation Oncology

## 2014-06-25 ENCOUNTER — Other Ambulatory Visit (HOSPITAL_BASED_OUTPATIENT_CLINIC_OR_DEPARTMENT_OTHER): Payer: Medicare Other

## 2014-06-25 VITALS — BP 112/76 | HR 79 | Temp 97.2°F | Resp 18

## 2014-06-25 VITALS — BP 122/61 | HR 80 | Temp 98.2°F | Resp 18 | Ht 74.0 in | Wt 236.3 lb

## 2014-06-25 DIAGNOSIS — C250 Malignant neoplasm of head of pancreas: Secondary | ICD-10-CM

## 2014-06-25 DIAGNOSIS — E1149 Type 2 diabetes mellitus with other diabetic neurological complication: Secondary | ICD-10-CM

## 2014-06-25 DIAGNOSIS — Z510 Encounter for antineoplastic radiation therapy: Secondary | ICD-10-CM | POA: Diagnosis not present

## 2014-06-25 DIAGNOSIS — E1142 Type 2 diabetes mellitus with diabetic polyneuropathy: Secondary | ICD-10-CM

## 2014-06-25 DIAGNOSIS — Z5111 Encounter for antineoplastic chemotherapy: Secondary | ICD-10-CM

## 2014-06-25 DIAGNOSIS — C259 Malignant neoplasm of pancreas, unspecified: Secondary | ICD-10-CM

## 2014-06-25 DIAGNOSIS — E1140 Type 2 diabetes mellitus with diabetic neuropathy, unspecified: Secondary | ICD-10-CM

## 2014-06-25 LAB — COMPREHENSIVE METABOLIC PANEL
ALBUMIN: 3.7 g/dL (ref 3.5–5.2)
ALT: 37 U/L (ref 0–53)
AST: 22 U/L (ref 0–37)
Alkaline Phosphatase: 173 U/L — ABNORMAL HIGH (ref 39–117)
BUN: 17 mg/dL (ref 6–23)
CHLORIDE: 99 meq/L (ref 96–112)
CO2: 25 meq/L (ref 19–32)
Calcium: 10.8 mg/dL — ABNORMAL HIGH (ref 8.4–10.5)
Creatinine, Ser: 1.27 mg/dL (ref 0.50–1.35)
Glucose, Bld: 291 mg/dL — ABNORMAL HIGH (ref 70–99)
POTASSIUM: 4.8 meq/L (ref 3.5–5.3)
SODIUM: 137 meq/L (ref 135–145)
TOTAL PROTEIN: 7.2 g/dL (ref 6.0–8.3)
Total Bilirubin: 0.8 mg/dL (ref 0.2–1.2)

## 2014-06-25 LAB — CBC WITH DIFFERENTIAL/PLATELET
BASO%: 0.6 % (ref 0.0–2.0)
Basophils Absolute: 0.1 10*3/uL (ref 0.0–0.1)
EOS ABS: 0.2 10*3/uL (ref 0.0–0.5)
EOS%: 2.8 % (ref 0.0–7.0)
HCT: 41.1 % (ref 38.4–49.9)
HGB: 14.1 g/dL (ref 13.0–17.1)
LYMPH#: 1.9 10*3/uL (ref 0.9–3.3)
LYMPH%: 24 % (ref 14.0–49.0)
MCH: 32.9 pg (ref 27.2–33.4)
MCHC: 34.3 g/dL (ref 32.0–36.0)
MCV: 96 fL (ref 79.3–98.0)
MONO#: 0.8 10*3/uL (ref 0.1–0.9)
MONO%: 9.5 % (ref 0.0–14.0)
NEUT%: 63.1 % (ref 39.0–75.0)
NEUTROS ABS: 5 10*3/uL (ref 1.5–6.5)
Platelets: 171 10*3/uL (ref 140–400)
RBC: 4.28 10*6/uL (ref 4.20–5.82)
RDW: 13.6 % (ref 11.0–14.6)
WBC: 7.9 10*3/uL (ref 4.0–10.3)

## 2014-06-25 MED ORDER — HEPARIN SOD (PORK) LOCK FLUSH 100 UNIT/ML IV SOLN
500.0000 [IU] | Freq: Once | INTRAVENOUS | Status: AC | PRN
  Administered 2014-06-25: 500 [IU]
  Filled 2014-06-25: qty 5

## 2014-06-25 MED ORDER — SODIUM CHLORIDE 0.9 % IJ SOLN
10.0000 mL | INTRAMUSCULAR | Status: DC | PRN
  Administered 2014-06-25: 10 mL
  Filled 2014-06-25: qty 10

## 2014-06-25 MED ORDER — PROCHLORPERAZINE MALEATE 10 MG PO TABS
10.0000 mg | ORAL_TABLET | Freq: Once | ORAL | Status: AC
  Administered 2014-06-25: 10 mg via ORAL

## 2014-06-25 MED ORDER — RADIAPLEXRX EX GEL
Freq: Once | CUTANEOUS | Status: AC
  Administered 2014-06-25: 09:00:00 via TOPICAL

## 2014-06-25 MED ORDER — PROCHLORPERAZINE MALEATE 10 MG PO TABS
ORAL_TABLET | ORAL | Status: AC
  Filled 2014-06-25: qty 1

## 2014-06-25 MED ORDER — GEMCITABINE HCL CHEMO INJECTION 1 GM/26.3ML
800.0000 mg/m2 | Freq: Once | INTRAVENOUS | Status: AC
  Administered 2014-06-25: 1900 mg via INTRAVENOUS
  Filled 2014-06-25: qty 49.97

## 2014-06-25 MED ORDER — SODIUM CHLORIDE 0.9 % IV SOLN
Freq: Once | INTRAVENOUS | Status: AC
  Administered 2014-06-25: 12:00:00 via INTRAVENOUS

## 2014-06-25 NOTE — Progress Notes (Signed)
[name] OFFICE PROGRESS NOTE  Patient Care Team: [name], MD as PCP - General (Internal Medicine) [name], MD as Referring Physician (Specialist) Provider Not In System [name], MD as Consulting Physician (Hematology and Oncology)  SUMMARY OF ONCOLOGIC HISTORY: Oncology History   Pancreatic cancer   Primary site: Pancreas   Staging method: AJCC 7th Edition   Clinical: Stage IIA (T3, N0, M0) signed by [name], MD on 06/13/2014  4:37 PM   Summary: Stage IIA (T3, N0, M0)       Pancreatic cancer   06/05/2014 Imaging  MRI abdomen showed 2.2 cm lesion in the pancreatic head obstructing the common bile duct and the pancreatic duct is highly concerning for pancreatic adenocarcinoma.  There is moderate intra and extrahepatic biliary duct dilatation.    06/05/2014 Tumor Marker CA 19-9 is elevated at 1234   06/06/2014 Imaging Ct scan of chest showed 2 lung nodules, likely benign   06/07/2014 Procedure He had ERCP and stent placement   06/07/2014 Procedure He had endoscopic ultrasound with  fine needle aspiration.   06/07/2014 Pathology Results Accession: [id number] FNA is positive for pancreatic adenoca   06/14/2014 Surgery He has port placement   06/25/2014 -  Chemotherapy He received neoadjuvant chemotherapy with Gemzar   06/25/2014 -  Radiation Therapy he received neoadjuvant radiation    INTERVAL HISTORY: Please see below for problem oriented charting. He is seen prior to cycle 1 of chemotherapy. He tolerated radiation well. He denies any pain. His jaundice has resolved. His only main problem is difficulties controlling his blood sugar.  REVIEW OF SYSTEMS:   Constitutional: Denies fevers, chills or abnormal weight loss Eyes: Denies blurriness of vision Ears, nose, mouth, throat, and face: Denies mucositis or sore throat Respiratory: Denies cough, dyspnea or wheezes Cardiovascular: Denies palpitation, chest discomfort or lower extremity  swelling Gastrointestinal:  Denies nausea, heartburn or change in bowel habits Skin: Denies abnormal skin rashes Lymphatics: Denies new lymphadenopathy or easy bruising Neurological:Denies numbness, tingling or new weaknesses Behavioral/Psych: Mood is stable, no new changes  All other systems were reviewed with the patient and are negative.  I have reviewed the past medical history, past surgical history, social history and family history with the patient and they are unchanged from previous note.  ALLERGIES:  is allergic to ace inhibitors and ciprofloxacin.  MEDICATIONS:  Current Outpatient Prescriptions  Medication Sig Dispense Refill  . aspirin EC 81 MG tablet Take 81 mg by mouth daily.      . B Complex-C (SUPER B COMPLEX/VITAMIN C PO) Take 1 tablet by mouth daily.       . cholecalciferol (VITAMIN D) 1000 UNITS tablet Take 1,000 Units by mouth daily.      . dasatinib (SPRYCEL) 50 MG tablet Take 50 mg by mouth at bedtime.      . diphenhydrAMINE (BENADRYL) 25 mg capsule Take 1 capsule (25 mg total) by mouth every 6 (six) hours as needed for itching.  30 capsule  0  . hyaluronate sodium (RADIAPLEXRX) GEL Apply 1 application topically daily. Apply to affected treated area daily and prn after rad txs      . hydrocortisone cream 1 % Apply 1 application topically 2 (two) times daily as needed for itching (Rash).      Marland Kitchen HYDROmorphone (DILAUDID) 2 MG tablet Take 1 tablet (2 mg total) by mouth every 4 (four) hours as needed for severe pain.  30 tablet  0  . insulin glargine (LANTUS) 100 UNIT/ML  injection Inject 35 Units into the skin at bedtime.       . lidocaine-prilocaine (EMLA) cream Apply 1 application topically as needed.  30 g  6  . losartan (COZAAR) 100 MG tablet Take 100 mg by mouth every morning.       . metFORMIN (GLUCOPHAGE) 1000 MG tablet Take 1,000 mg by mouth 2 (two) times daily with a meal.      . ondansetron (ZOFRAN) 8 MG tablet Take 1 tablet (8 mg total) by mouth every 8 (eight)  hours as needed for nausea.  20 tablet  2  . promethazine (PHENERGAN) 25 MG tablet Take 1 tablet (25 mg total) by mouth every 6 (six) hours as needed for nausea or vomiting.  60 tablet  3  . saxagliptin HCl (ONGLYZA) 5 MG TABS tablet Take 5 mg by mouth daily.      Marland Kitchen senna (SENOKOT) 8.6 MG tablet Take 2 tablets by mouth daily.       . simvastatin (ZOCOR) 20 MG tablet Take 20 mg by mouth every evening.      . vitamin B-12 (CYANOCOBALAMIN) 1000 MCG tablet Take 1,000 mcg by mouth daily.      Marland Kitchen acetaminophen (TYLENOL) 325 MG tablet Take 650 mg by mouth every 6 (six) hours as needed for moderate pain.       No current facility-administered medications for this visit.   Facility-Administered Medications Ordered in Other Visits  Medication Dose Route Frequency Provider Last Rate Last Dose  . Gemcitabine HCl (GEMZAR) 1,900 mg in sodium chloride 0.9 % 100 mL chemo infusion  800 mg/m2 (Treatment Plan Actual) Intravenous Once [name], MD      . heparin lock flush 100 unit/mL  500 Units Intracatheter Once PRN [name], MD      . sodium chloride 0.9 % injection 10 mL  10 mL Intracatheter PRN [name], MD        PHYSICAL EXAMINATION: ECOG PERFORMANCE STATUS: 0 - Asymptomatic  Filed Vitals:   06/25/14 1117  BP: 122/61  Pulse: 80  Temp: 98.2 F (36.8 C)  Resp: 18   Filed Weights   06/25/14 1117  Weight: 236 lb 4.8 oz (107.185 kg)    GENERAL:alert, no distress and comfortable. He is morbidly obese SKIN: skin color, texture, turgor are normal, no rashes or significant lesions EYES: normal, Conjunctiva are pink and non-injected, sclera clear OROPHARYNX:no exudate, no erythema and lips, buccal mucosa, and tongue normal  Musculoskeletal:no cyanosis of digits and no clubbing  NEURO: alert & oriented x 3 with fluent speech, no focal motor/sensory deficits  LABORATORY DATA:  I have reviewed the data as listed    Component Value Date/Time   NA 137 06/25/2014 1100   NA 138 06/13/2014 1551   K  4.8 06/25/2014 1100   K 4.3 06/13/2014 1551   CL 99 06/25/2014 1100   CL 107 04/27/2013 0926   CO2 25 06/25/2014 1100   CO2 25 06/13/2014 1551   GLUCOSE 291* 06/25/2014 1100   GLUCOSE 342* 06/13/2014 1551   GLUCOSE 212* 04/27/2013 0926   BUN 17 06/25/2014 1100   BUN 21.7 06/13/2014 1551   CREATININE 1.27 06/25/2014 1100   CREATININE 1.5* 06/13/2014 1551   CALCIUM 10.8* 06/25/2014 1100   CALCIUM 10.5* 06/13/2014 1551   PROT 7.2 06/25/2014 1100   PROT 6.9 06/13/2014 1551   ALBUMIN 3.7 06/25/2014 1100   ALBUMIN 3.5 06/13/2014 1551   AST 22 06/25/2014 1100   AST 35* 06/13/2014 1551  ALT 37 06/25/2014 1100   ALT 92* 06/13/2014 1551   ALKPHOS 173* 06/25/2014 1100   ALKPHOS 266* 06/13/2014 1551   BILITOT 0.8 06/25/2014 1100   BILITOT 1.30* 06/13/2014 1551   GFRNONAA 68* 06/08/2014 0437   GFRAA 79* 06/08/2014 0437    No results found for this basename: SPEP, UPEP,  kappa and lambda light chains    Lab Results  Component Value Date   WBC 7.9 06/25/2014   NEUTROABS 5.0 06/25/2014   HGB 14.1 06/25/2014   HCT 41.1 06/25/2014   MCV 96.0 06/25/2014   PLT 171 06/25/2014      Chemistry      Component Value Date/Time   NA 137 06/25/2014 1100   NA 138 06/13/2014 1551   K 4.8 06/25/2014 1100   K 4.3 06/13/2014 1551   CL 99 06/25/2014 1100   CL 107 04/27/2013 0926   CO2 25 06/25/2014 1100   CO2 25 06/13/2014 1551   BUN 17 06/25/2014 1100   BUN 21.7 06/13/2014 1551   CREATININE 1.27 06/25/2014 1100   CREATININE 1.5* 06/13/2014 1551      Component Value Date/Time   CALCIUM 10.8* 06/25/2014 1100   CALCIUM 10.5* 06/13/2014 1551   ALKPHOS 173* 06/25/2014 1100   ALKPHOS 266* 06/13/2014 1551   AST 22 06/25/2014 1100   AST 35* 06/13/2014 1551   ALT 37 06/25/2014 1100   ALT 92* 06/13/2014 1551   BILITOT 0.8 06/25/2014 1100   BILITOT 1.30* 06/13/2014 1551      ASSESSMENT & PLAN:  Pancreatic cancer We have long discussion about this. We discussed about the role of preoperative neoadjuvant chemoradiation therapy. We discussed  the role of chemotherapy. The intent is for cure.  We discussed some of the risks, benefits, side-effects of Gemzar.   Some of the short term side-effects included, though not limited to, risk of fatigue, weight loss, pancytopenia, life-threatening infections, need for transfusions of blood products, nausea, vomiting, change in bowel habits, blood clots, admission to hospital for various reasons, and risks of death.   Long term side-effects are also discussed including risks of chronic fatigue, and rare secondary malignancy including bone marrow disorders such as acute leukemia.   The patient is aware that the response rates discussed earlier is not guaranteed.    After a long discussion, patient made an informed decision to proceed with the prescribed plan of care and went ahead to sign the consent form today.   Patient education material was dispensed. 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 I have him suggestion about how to adjust his insulin dose as needed.    No orders of the defined types were placed in this encounter.   All questions were answered. The patient knows to call the clinic with any problems, questions or concerns. No barriers to learning was detected. I spent 40 minutes counseling the patient face to face. The total time spent in the appointment was 55 minutes and more than 50% was on counseling and review of test results     [facility], [place], MD 06/25/2014 12:20 PM

## 2014-06-25 NOTE — Progress Notes (Signed)
Patient tolerated 1st gemzar infusion without difficulty. AVS/appts/ and s/s to report reviewed with wife and patient. Both voice understanding, no concerns at this time.

## 2014-06-25 NOTE — Assessment & Plan Note (Signed)
We have long discussion about this. We discussed about the role of preoperative neoadjuvant chemoradiation therapy. We discussed the role of chemotherapy. The intent is for cure.  We discussed some of the risks, benefits, side-effects of Gemzar.   Some of the short term side-effects included, though not limited to, risk of fatigue, weight loss, pancytopenia, life-threatening infections, need for transfusions of blood products, nausea, vomiting, change in bowel habits, blood clots, admission to hospital for various reasons, and risks of death.   Long term side-effects are also discussed including risks of chronic fatigue, and rare secondary malignancy including bone marrow disorders such as acute leukemia.   The patient is aware that the response rates discussed earlier is not guaranteed.    After a long discussion, patient made an informed decision to proceed with the prescribed plan of care and went ahead to sign the consent form today.   Patient education material was dispensed. We will proceed with Rx today

## 2014-06-25 NOTE — Assessment & Plan Note (Signed)
His blood sugar remains poorly controlled. I have him suggestion about how to adjust his insulin dose as needed.

## 2014-06-25 NOTE — Telephone Encounter (Signed)
Pt confirmed labs/ov per 08/10 POF, gave pt AVS..Marland KitchenKJ

## 2014-06-25 NOTE — Patient Instructions (Signed)
[place] Discharge Instructions for Patients Receiving Chemotherapy  Today you received the following chemotherapy agents: Gemzar  To help prevent nausea and vomiting after your treatment, we encourage you to take your nausea medication as prescribed by your physician.    If you develop nausea and vomiting that is not controlled by your nausea medication, call the clinic.   BELOW ARE SYMPTOMS THAT SHOULD BE REPORTED IMMEDIATELY:  *FEVER GREATER THAN 100.5 F  *CHILLS WITH OR WITHOUT FEVER  NAUSEA AND VOMITING THAT IS NOT CONTROLLED WITH YOUR NAUSEA MEDICATION  *UNUSUAL SHORTNESS OF BREATH  *UNUSUAL BRUISING OR BLEEDING  TENDERNESS IN MOUTH AND THROAT WITH OR WITHOUT PRESENCE OF ULCERS  *URINARY PROBLEMS  *BOWEL PROBLEMS  UNUSUAL RASH Items with * indicate a potential emergency and should be followed up as soon as possible.  Feel free to call the clinic you have any questions or concerns. The clinic phone number is [phone].  Gemcitabine injection (Gemzar) What is this medicine? GEMCITABINE (jem SIT a been) is a chemotherapy drug. This medicine is used to treat many types of cancer like breast cancer, lung cancer, pancreatic cancer, and ovarian cancer. This medicine may be used for other purposes; ask your health care provider or pharmacist if you have questions. COMMON BRAND NAME(S): Gemzar What should I tell my health care provider before I take this medicine? They need to know if you have any of these conditions: -blood disorders -infection -kidney disease -liver disease -recent or ongoing radiation therapy -an unusual or allergic reaction to gemcitabine, other chemotherapy, other medicines, foods, dyes, or preservatives -pregnant or trying to get pregnant -breast-feeding How should I use this medicine? This drug is given as an infusion into a vein. It is administered in a hospital or clinic by a specially trained health care professional. Talk  to your pediatrician regarding the use of this medicine in children. Special care may be needed. Overdosage: If you think you have taken too much of this medicine contact a poison control center or emergency room at once. NOTE: This medicine is only for you. Do not share this medicine with others. What if I miss a dose? It is important not to miss your dose. Call your doctor or health care professional if you are unable to keep an appointment. What may interact with this medicine? -medicines to increase blood counts like filgrastim, pegfilgrastim, sargramostim -some other chemotherapy drugs like cisplatin -vaccines Talk to your doctor or health care professional before taking any of these medicines: -acetaminophen -aspirin -ibuprofen -ketoprofen -naproxen This list may not describe all possible interactions. Give your health care provider a list of all the medicines, herbs, non-prescription drugs, or dietary supplements you use. Also tell them if you smoke, drink alcohol, or use illegal drugs. Some items may interact with your medicine. What should I watch for while using this medicine? Visit your doctor for checks on your progress. This drug may make you feel generally unwell. This is not uncommon, as chemotherapy can affect healthy cells as well as cancer cells. Report any side effects. Continue your course of treatment even though you feel ill unless your doctor tells you to stop. In some cases, you may be given additional medicines to help with side effects. Follow all directions for their use. Call your doctor or health care professional for advice if you get a fever, chills or sore throat, or other symptoms of a cold or flu. Do not treat yourself. This drug decreases your body's ability to  fight infections. Try to avoid being around people who are sick. This medicine may increase your risk to bruise or bleed. Call your doctor or health care professional if you notice any unusual  bleeding. Be careful brushing and flossing your teeth or using a toothpick because you may get an infection or bleed more easily. If you have any dental work done, tell your dentist you are receiving this medicine. Avoid taking products that contain aspirin, acetaminophen, ibuprofen, naproxen, or ketoprofen unless instructed by your doctor. These medicines may hide a fever. Women should inform their doctor if they wish to become pregnant or think they might be pregnant. There is a potential for serious side effects to an unborn child. Talk to your health care professional or pharmacist for more information. Do not breast-feed an infant while taking this medicine. What side effects may I notice from receiving this medicine? Side effects that you should report to your doctor or health care professional as soon as possible: -allergic reactions like skin rash, itching or hives, swelling of the face, lips, or tongue -low blood counts - this medicine may decrease the number of white blood cells, red blood cells and platelets. You may be at increased risk for infections and bleeding. -signs of infection - fever or chills, cough, sore throat, pain or difficulty passing urine -signs of decreased platelets or bleeding - bruising, pinpoint red spots on the skin, black, tarry stools, blood in the urine -signs of decreased red blood cells - unusually weak or tired, fainting spells, lightheadedness -breathing problems -chest pain -mouth sores -nausea and vomiting -pain, swelling, redness at site where injected -pain, tingling, numbness in the hands or feet -stomach pain -swelling of ankles, feet, hands -unusual bleeding Side effects that usually do not require medical attention (report to your doctor or health care professional if they continue or are bothersome): -constipation -diarrhea -hair loss -loss of appetite -stomach upset This list may not describe all possible side effects. Call your doctor for  medical advice about side effects. You may report side effects to FDA at 1-800-FDA-1088. Where should I keep my medicine? This drug is given in a hospital or clinic and will not be stored at home. NOTE: This sheet is a summary. It may not cover all possible information. If you have questions about this medicine, talk to your doctor, pharmacist, or health care provider.  2015, [employer]. (2008-03-13 18:45:54)

## 2014-06-25 NOTE — Progress Notes (Signed)
Patient education done, radiation therapy and you book given along with my business card and radiaplex gel with instructions of use of product, to apply once skin becomes irritated or itching ,use then daily after radiation therapy and bedtime, sees MD  Staff RN weekly and prn,.discusses ,nausea,vomiting, diarrhea, to have imodium ad on hand and use prn for diarrhea, , increase protein in diet, may need to go to 5-6 smaller meals throughout the day instead of 3 large meals, patient has nausea medication on hand, drink plenty fluids, may need low fiber diet ,discussed brat diet as well, gave patient nurse station phone number can call for any questions/concerns, will have wife read pages turned down for side effects, teach back given, all questions answered 10:34 AM

## 2014-06-26 ENCOUNTER — Other Ambulatory Visit: Payer: Self-pay | Admitting: Hematology and Oncology

## 2014-06-26 ENCOUNTER — Ambulatory Visit
Admission: RE | Admit: 2014-06-26 | Discharge: 2014-06-26 | Disposition: A | Discharge status: Home / Self Care | Payer: Medicare Other | Source: Ambulatory Visit | Attending: Radiation Oncology | Admitting: Radiation Oncology

## 2014-06-26 DIAGNOSIS — Z510 Encounter for antineoplastic radiation therapy: Secondary | ICD-10-CM | POA: Diagnosis not present

## 2014-06-27 ENCOUNTER — Ambulatory Visit
Admission: RE | Admit: 2014-06-27 | Discharge: 2014-06-27 | Disposition: A | Discharge status: Home / Self Care | Payer: Medicare Other | Source: Ambulatory Visit | Attending: Radiation Oncology | Admitting: Radiation Oncology

## 2014-06-27 DIAGNOSIS — Z510 Encounter for antineoplastic radiation therapy: Secondary | ICD-10-CM | POA: Diagnosis not present

## 2014-06-27 LAB — CANCER ANTIGEN 19-9

## 2014-06-28 ENCOUNTER — Ambulatory Visit
Admission: RE | Admit: 2014-06-28 | Discharge: 2014-06-28 | Disposition: A | Discharge status: Home / Self Care | Payer: Medicare Other | Source: Ambulatory Visit | Attending: Radiation Oncology | Admitting: Radiation Oncology

## 2014-06-28 DIAGNOSIS — Z510 Encounter for antineoplastic radiation therapy: Secondary | ICD-10-CM | POA: Diagnosis not present

## 2014-06-29 ENCOUNTER — Ambulatory Visit
Admission: RE | Admit: 2014-06-29 | Discharge: 2014-06-29 | Disposition: A | Discharge status: Home / Self Care | Payer: Medicare Other | Source: Ambulatory Visit | Attending: Radiation Oncology | Admitting: Radiation Oncology

## 2014-06-29 ENCOUNTER — Encounter: Payer: Self-pay | Admitting: Radiation Oncology

## 2014-06-29 VITALS — BP 114/72 | HR 88 | Temp 98.1°F | Resp 20 | Wt 236.8 lb

## 2014-06-29 DIAGNOSIS — Z510 Encounter for antineoplastic radiation therapy: Secondary | ICD-10-CM | POA: Diagnosis not present

## 2014-06-29 DIAGNOSIS — C250 Malignant neoplasm of head of pancreas: Secondary | ICD-10-CM

## 2014-06-29 NOTE — Progress Notes (Signed)
  Radiation Oncology         [phone] ________________________________  Name: Evan [name] MRN: [id number]  Date: 06/15/2014  DOB: August 22, 1941  SIMULATION AND TREATMENT PLANNING NOTE   CONSENT VERIFIED: yes   SET UP: Patient is set-up supine   IMMOBILIZATION: The following immobilization is used: alpha-cradle. This complex treatment device will be used on a daily basis during the patient's treatment.   Diagnosis: Pancreatic cancer   NARRATIVE: The patient was brought to the [facility]. Identity was confirmed. All relevant records and images related to the planned course of therapy were reviewed. Then, the patient was positioned in a stable reproducible clinical set-up for radiation therapy using a customized Vac-lock bag. Skin markings were placed. The CT images were loaded into the planning software where the target and avoidance structures were contoured.The radiation prescription was entered and confirmed.   The patient will receive 50.4 Gy in 28 fractions.   Daily image guidance is ordered, and this will be used on a daily basis. This is necessary to ensure accurate and precise localization of the target in addition to accurate alignment of the normal tissue structures in this region.   Treatment planning then occurred.   I have requested : Intensity Modulated Radiotherapy (IMRT) is medically necessary for this case for the following reason: Dose homogeneity; the target is in close proximity to critical normal structures, including the kidneys and the liver. IMRT is thus medically to appropriately treat the patient.   Special treatment procedure  The patient will receive chemotherapy during the course of radiation treatment. The patient may experience increased or overlapping toxicity due to this combined-modality approach and the patient will be monitored for such problems. This may include extra lab work as necessary. This therefore constitutes a special  treatment procedure.    ________________________________  Jodelle Gross, MD, PhD

## 2014-06-29 NOTE — Progress Notes (Signed)
Department of Radiation Oncology  Phone:  [phone] Fax:        [phone]  Weekly Treatment Note    Name: Evan [name] Date: 06/29/2014 MRN: [id number] DOB: August 31, 1941   Current dose: 9 Gy  Current fraction: 5   MEDICATIONS: Current Outpatient Prescriptions  Medication Sig Dispense Refill  . acetaminophen (TYLENOL) 325 MG tablet Take 650 mg by mouth every 6 (six) hours as needed for moderate pain.      Marland Kitchen aspirin EC 81 MG tablet Take 81 mg by mouth daily.      . B Complex-C (SUPER B COMPLEX/VITAMIN C PO) Take 1 tablet by mouth daily.       . cholecalciferol (VITAMIN D) 1000 UNITS tablet Take 1,000 Units by mouth daily.      . dasatinib (SPRYCEL) 50 MG tablet Take 50 mg by mouth at bedtime.      . diphenhydrAMINE (BENADRYL) 25 mg capsule Take 1 capsule (25 mg total) by mouth every 6 (six) hours as needed for itching.  30 capsule  0  . hyaluronate sodium (RADIAPLEXRX) GEL Apply 1 application topically daily. Apply to affected treated area daily and prn after rad txs      . hydrocortisone cream 1 % Apply 1 application topically 2 (two) times daily as needed for itching (Rash).      Marland Kitchen HYDROmorphone (DILAUDID) 2 MG tablet Take 1 tablet (2 mg total) by mouth every 4 (four) hours as needed for severe pain.  30 tablet  0  . insulin glargine (LANTUS) 100 UNIT/ML injection Inject 35 Units into the skin at bedtime.       Marland Kitchen lactulose (CHRONULAC) 10 GM/15ML solution Take 15 mLs by mouth as needed. Hasn't used as yet,      . lidocaine-prilocaine (EMLA) cream Apply 1 application topically as needed.  30 g  6  . losartan (COZAAR) 100 MG tablet Take 100 mg by mouth every morning.       . metFORMIN (GLUCOPHAGE) 1000 MG tablet Take 1,000 mg by mouth 2 (two) times daily with a meal.      . ondansetron (ZOFRAN) 8 MG tablet Take 1 tablet (8 mg total) by mouth every 8 (eight) hours as needed for nausea.  20 tablet  2  . promethazine (PHENERGAN) 25 MG tablet Take 1 tablet (25 mg total) by  mouth every 6 (six) hours as needed for nausea or vomiting.  60 tablet  3  . saxagliptin HCl (ONGLYZA) 5 MG TABS tablet Take 5 mg by mouth daily.      Marland Kitchen senna (SENOKOT) 8.6 MG tablet Take 2 tablets by mouth daily.       . simvastatin (ZOCOR) 20 MG tablet Take 20 mg by mouth every evening.      . vitamin B-12 (CYANOCOBALAMIN) 1000 MCG tablet Take 1,000 mcg by mouth daily.       No current facility-administered medications for this encounter.     ALLERGIES: Ace inhibitors and Ciprofloxacin   LABORATORY DATA:  Lab Results  Component Value Date   WBC 7.9 06/25/2014   HGB 14.1 06/25/2014   HCT 41.1 06/25/2014   MCV 96.0 06/25/2014   PLT 171 06/25/2014   Lab Results  Component Value Date   NA 137 06/25/2014   K 4.8 06/25/2014   CL 99 06/25/2014   CO2 25 06/25/2014   Lab Results  Component Value Date   ALT 37 06/25/2014   AST 22 06/25/2014   ALKPHOS 173* 06/25/2014  BILITOT 0.8 06/25/2014     NARRATIVE: Evan [name] was seen today for weekly treatment management. The chart was checked and the patient's films were reviewed. The patient is doing well at this time. He really has not had substantial GI toxicity. No nausea. No diarrhea. The patient has actually had some constipation and is managing this with Senokot S. medication.  PHYSICAL EXAMINATION: weight is 236 lb 12.8 oz (107.412 kg). His oral temperature is 98.1 F (36.7 C). His blood pressure is 114/72 and his pulse is 88. His respiration is 20.        ASSESSMENT: The patient is doing satisfactorily with treatment.  PLAN: We will continue with the patient's radiation treatment as planned.

## 2014-06-29 NOTE — Progress Notes (Signed)
Weekly rad txs 5/28 pancreas, appetite good, , started to c/o itching on abdomen, has biafine cream already,will start using today, had started sennakot S  Took 2 last night ,had 3 bm's today so far, no c/o nausea, no c/o pain, has lactulose rx hasn't used as yet, will have chemotherapy gemzar, infusion Monday 9:46 AM

## 2014-07-02 ENCOUNTER — Telehealth: Payer: Self-pay | Admitting: Hematology and Oncology

## 2014-07-02 ENCOUNTER — Ambulatory Visit (HOSPITAL_BASED_OUTPATIENT_CLINIC_OR_DEPARTMENT_OTHER): Payer: Medicare Other

## 2014-07-02 ENCOUNTER — Other Ambulatory Visit: Payer: Self-pay | Admitting: Hematology and Oncology

## 2014-07-02 ENCOUNTER — Ambulatory Visit
Admission: RE | Admit: 2014-07-02 | Discharge: 2014-07-02 | Disposition: A | Discharge status: Home / Self Care | Payer: Medicare Other | Source: Ambulatory Visit | Attending: Radiation Oncology | Admitting: Radiation Oncology

## 2014-07-02 ENCOUNTER — Encounter: Payer: Self-pay | Admitting: Hematology and Oncology

## 2014-07-02 ENCOUNTER — Ambulatory Visit (HOSPITAL_BASED_OUTPATIENT_CLINIC_OR_DEPARTMENT_OTHER): Payer: Medicare Other | Admitting: Hematology and Oncology

## 2014-07-02 ENCOUNTER — Other Ambulatory Visit: Payer: Self-pay | Admitting: *Deleted

## 2014-07-02 ENCOUNTER — Other Ambulatory Visit (HOSPITAL_BASED_OUTPATIENT_CLINIC_OR_DEPARTMENT_OTHER): Payer: Medicare Other

## 2014-07-02 ENCOUNTER — Telehealth: Payer: Self-pay | Admitting: *Deleted

## 2014-07-02 VITALS — BP 124/69 | HR 76 | Temp 97.8°F | Resp 17 | Ht 74.0 in | Wt 237.3 lb

## 2014-07-02 VITALS — BP 124/69 | HR 76 | Temp 97.8°F | Resp 17

## 2014-07-02 DIAGNOSIS — N183 Chronic kidney disease, stage 3 unspecified: Secondary | ICD-10-CM

## 2014-07-02 DIAGNOSIS — C9211 Chronic myeloid leukemia, BCR/ABL-positive, in remission: Secondary | ICD-10-CM

## 2014-07-02 DIAGNOSIS — C250 Malignant neoplasm of head of pancreas: Secondary | ICD-10-CM

## 2014-07-02 DIAGNOSIS — Z5111 Encounter for antineoplastic chemotherapy: Secondary | ICD-10-CM

## 2014-07-02 DIAGNOSIS — E1140 Type 2 diabetes mellitus with diabetic neuropathy, unspecified: Secondary | ICD-10-CM

## 2014-07-02 DIAGNOSIS — Z95828 Presence of other vascular implants and grafts: Secondary | ICD-10-CM

## 2014-07-02 DIAGNOSIS — D630 Anemia in neoplastic disease: Secondary | ICD-10-CM

## 2014-07-02 DIAGNOSIS — Z510 Encounter for antineoplastic radiation therapy: Secondary | ICD-10-CM | POA: Diagnosis not present

## 2014-07-02 DIAGNOSIS — D701 Agranulocytosis secondary to cancer chemotherapy: Secondary | ICD-10-CM

## 2014-07-02 DIAGNOSIS — E1129 Type 2 diabetes mellitus with other diabetic kidney complication: Secondary | ICD-10-CM

## 2014-07-02 DIAGNOSIS — E1149 Type 2 diabetes mellitus with other diabetic neurological complication: Secondary | ICD-10-CM

## 2014-07-02 DIAGNOSIS — T50905A Adverse effect of unspecified drugs, medicaments and biological substances, initial encounter: Secondary | ICD-10-CM

## 2014-07-02 DIAGNOSIS — D6959 Other secondary thrombocytopenia: Secondary | ICD-10-CM | POA: Insufficient documentation

## 2014-07-02 DIAGNOSIS — T451X5A Adverse effect of antineoplastic and immunosuppressive drugs, initial encounter: Secondary | ICD-10-CM

## 2014-07-02 DIAGNOSIS — E1122 Type 2 diabetes mellitus with diabetic chronic kidney disease: Secondary | ICD-10-CM

## 2014-07-02 DIAGNOSIS — K1231 Oral mucositis (ulcerative) due to antineoplastic therapy: Secondary | ICD-10-CM

## 2014-07-02 HISTORY — DX: Oral mucositis (ulcerative) due to antineoplastic therapy: K12.31

## 2014-07-02 LAB — COMPREHENSIVE METABOLIC PANEL (CC13)
ALBUMIN: 3.6 g/dL (ref 3.5–5.0)
ALT: 20 U/L (ref 0–55)
ANION GAP: 9 meq/L (ref 3–11)
AST: 22 U/L (ref 5–34)
Alkaline Phosphatase: 112 U/L (ref 40–150)
BUN: 14.6 mg/dL (ref 7.0–26.0)
CO2: 22 meq/L (ref 22–29)
Calcium: 10.1 mg/dL (ref 8.4–10.4)
Chloride: 107 mEq/L (ref 98–109)
Creatinine: 1.2 mg/dL (ref 0.7–1.3)
GLUCOSE: 244 mg/dL — AB (ref 70–140)
POTASSIUM: 4.4 meq/L (ref 3.5–5.1)
SODIUM: 138 meq/L (ref 136–145)
TOTAL PROTEIN: 6.8 g/dL (ref 6.4–8.3)
Total Bilirubin: 0.72 mg/dL (ref 0.20–1.20)

## 2014-07-02 LAB — CBC WITH DIFFERENTIAL/PLATELET
BASO%: 1.2 % (ref 0.0–2.0)
Basophils Absolute: 0 10*3/uL (ref 0.0–0.1)
EOS%: 1.9 % (ref 0.0–7.0)
Eosinophils Absolute: 0.1 10*3/uL (ref 0.0–0.5)
HEMATOCRIT: 37.3 % — AB (ref 38.4–49.9)
HGB: 12.7 g/dL — ABNORMAL LOW (ref 13.0–17.1)
LYMPH#: 1.3 10*3/uL (ref 0.9–3.3)
LYMPH%: 40.4 % (ref 14.0–49.0)
MCH: 32.8 pg (ref 27.2–33.4)
MCHC: 34.1 g/dL (ref 32.0–36.0)
MCV: 96.1 fL (ref 79.3–98.0)
MONO#: 0.3 10*3/uL (ref 0.1–0.9)
MONO%: 10.1 % (ref 0.0–14.0)
NEUT%: 46.4 % (ref 39.0–75.0)
NEUTROS ABS: 1.5 10*3/uL (ref 1.5–6.5)
Platelets: 104 10*3/uL — ABNORMAL LOW (ref 140–400)
RBC: 3.88 10*6/uL — ABNORMAL LOW (ref 4.20–5.82)
RDW: 13.6 % (ref 11.0–14.6)
WBC: 3.2 10*3/uL — AB (ref 4.0–10.3)

## 2014-07-02 MED ORDER — HEPARIN SOD (PORK) LOCK FLUSH 100 UNIT/ML IV SOLN
500.0000 [IU] | Freq: Once | INTRAVENOUS | Status: AC | PRN
  Administered 2014-07-02: 500 [IU]
  Filled 2014-07-02: qty 5

## 2014-07-02 MED ORDER — HEPARIN SOD (PORK) LOCK FLUSH 100 UNIT/ML IV SOLN
500.0000 [IU] | Freq: Once | INTRAVENOUS | Status: AC
  Administered 2014-07-02: 500 [IU] via INTRAVENOUS
  Filled 2014-07-02: qty 5

## 2014-07-02 MED ORDER — PROCHLORPERAZINE MALEATE 10 MG PO TABS
10.0000 mg | ORAL_TABLET | Freq: Once | ORAL | Status: AC
  Administered 2014-07-02: 10 mg via ORAL

## 2014-07-02 MED ORDER — MAGIC MOUTHWASH W/LIDOCAINE: 5.0000 mL | Freq: Four times a day (QID) | ORAL | Status: DC

## 2014-07-02 MED ORDER — SODIUM CHLORIDE 0.9 % IV SOLN
Freq: Once | INTRAVENOUS | Status: AC
  Administered 2014-07-02: 13:00:00 via INTRAVENOUS

## 2014-07-02 MED ORDER — PROCHLORPERAZINE MALEATE 10 MG PO TABS
ORAL_TABLET | ORAL | Status: AC
  Filled 2014-07-02: qty 1

## 2014-07-02 MED ORDER — GEMCITABINE HCL CHEMO INJECTION 1 GM/26.3ML
800.0000 mg/m2 | Freq: Once | INTRAVENOUS | Status: AC
  Administered 2014-07-02: 1900 mg via INTRAVENOUS
  Filled 2014-07-02: qty 49.97

## 2014-07-02 MED ORDER — SODIUM CHLORIDE 0.9 % IJ SOLN
10.0000 mL | INTRAMUSCULAR | Status: DC | PRN
  Administered 2014-07-02: 10 mL
  Filled 2014-07-02: qty 10

## 2014-07-02 MED ORDER — SODIUM CHLORIDE 0.9 % IJ SOLN
10.0000 mL | INTRAMUSCULAR | Status: DC | PRN
  Administered 2014-07-02: 10 mL via INTRAVENOUS
  Filled 2014-07-02: qty 10

## 2014-07-02 NOTE — Assessment & Plan Note (Signed)
We will continue insulin adjustments to keep that sugar below 150

## 2014-07-02 NOTE — Assessment & Plan Note (Signed)
This is likely due to recent treatment. The patient denies recent history of bleeding such as epistaxis, hematuria or hematochezia. [name] I will observe for now.  He does not require transfusion now. I will continue the chemotherapy at current dose without dosage adjustment.  If the anemia gets progressive worse in the future, I might have to delay his treatment or adjust the chemotherapy dose.  

## 2014-07-02 NOTE — Assessment & Plan Note (Signed)
Continue close monitoring of his kidney function.

## 2014-07-02 NOTE — Assessment & Plan Note (Signed)
The patient has been in remission for the past year. He will continue taking his chemotherapy through all this treatment.

## 2014-07-02 NOTE — Assessment & Plan Note (Signed)
He tolerates the chemotherapy well apart from mild pancytopenia and mild mucositis We will continue current treatment with dose adjustment.

## 2014-07-02 NOTE — Telephone Encounter (Signed)
Per staff message and POF I have scheduled appts. Advised scheduler of appts. JMW  

## 2014-07-02 NOTE — Assessment & Plan Note (Signed)
This is likely due to recent treatment. The patient denies recent history of bleeding such as epistaxis, hematuria or hematochezia. He is asymptomatic from the low platelet count. I will observe for now.  he does not require transfusion now. I will continue the chemotherapy at current dose without dosage adjustment.  If the thrombocytopenia gets progressive worse in the future, I might have to delay his treatment or adjust the chemotherapy dose.   

## 2014-07-02 NOTE — Telephone Encounter (Signed)
gv and printed appt scheda dn avs for pt for Aug....MD wants to see pt on 8.24 per staff msg.

## 2014-07-02 NOTE — Assessment & Plan Note (Addendum)
This is due to treatment. I recommend Magic mouthwash

## 2014-07-02 NOTE — Assessment & Plan Note (Signed)
This is likely due to recent treatment. The patient denies recent history of fevers, cough, chills, diarrhea or dysuria. He is asymptomatic from the leukopenia. I will observe for now.  I will continue the chemotherapy at current dose without dosage adjustment.  If the leukopenia gets progressive worse in the future, I might have to delay his treatment or adjust the chemotherapy dose. 

## 2014-07-02 NOTE — Patient Instructions (Signed)

## 2014-07-02 NOTE — Progress Notes (Signed)
[facility] OFFICE PROGRESS NOTE  Patient Care Team: [name], MD as PCP - General (Internal Medicine) Evan Comings, MD as Referring Physician (Specialist) Provider Not In System [name], MD as Consulting Physician (Hematology and Oncology)  SUMMARY OF ONCOLOGIC HISTORY: Oncology History   Pancreatic cancer   Primary site: Pancreas   Staging method: AJCC 7th Edition   Clinical: Stage IIA (T3, N0, M0) signed by [name], MD on 06/13/2014  4:37 PM   Summary: Stage IIA (T3, N0, M0)       Pancreatic cancer   06/05/2014 Imaging  MRI abdomen showed 2.2 cm lesion in the pancreatic head obstructing the common bile duct and the pancreatic duct is highly concerning for pancreatic adenocarcinoma.  There is moderate intra and extrahepatic biliary duct dilatation.    06/05/2014 Tumor Marker CA 19-9 is elevated at 1234   06/06/2014 Imaging Ct scan of chest showed 2 lung nodules, likely benign   06/07/2014 Procedure He had ERCP and stent placement   06/07/2014 Procedure He had endoscopic ultrasound with  fine needle aspiration.   06/07/2014 Pathology Results Accession: [id number] FNA is positive for pancreatic adenoca   06/14/2014 Surgery He has port placement   06/25/2014 -  Chemotherapy He received neoadjuvant chemotherapy with Gemzar   06/25/2014 -  Radiation Therapy he received neoadjuvant radiation    INTERVAL HISTORY: Please see below for problem oriented charting. He is seen prior to cycle 2 of treatment. He tolerated treatment well perform very mild mucositis. His blood sugar is better controlled with insulin adjustments. He denies diarrhea  REVIEW OF SYSTEMS:   Constitutional: Denies fevers, chills or abnormal weight loss Eyes: Denies blurriness of vision Respiratory: Denies cough, dyspnea or wheezes Cardiovascular: Denies palpitation, chest discomfort or lower extremity swelling Gastrointestinal:  Denies nausea, heartburn or change in bowel habits Skin: Denies  abnormal skin rashes Lymphatics: Denies new lymphadenopathy or easy bruising Neurological:Denies numbness, tingling or new weaknesses Behavioral/Psych: Mood is stable, no new changes  All other systems were reviewed with the patient and are negative.  I have reviewed the past medical history, past surgical history, social history and family history with the patient and they are unchanged from previous note.  ALLERGIES:  is allergic to ace inhibitors and ciprofloxacin.  MEDICATIONS:  Current Outpatient Prescriptions  Medication Sig Dispense Refill  . acetaminophen (TYLENOL) 325 MG tablet Take 650 mg by mouth every 6 (six) hours as needed for moderate pain.      Marland Kitchen aspirin EC 81 MG tablet Take 81 mg by mouth daily.      . B Complex-C (SUPER B COMPLEX/VITAMIN C PO) Take 1 tablet by mouth daily.       . cholecalciferol (VITAMIN D) 1000 UNITS tablet Take 1,000 Units by mouth daily.      . dasatinib (SPRYCEL) 50 MG tablet Take 50 mg by mouth at bedtime.      . diphenhydrAMINE (BENADRYL) 25 mg capsule Take 1 capsule (25 mg total) by mouth every 6 (six) hours as needed for itching.  30 capsule  0  . hyaluronate sodium (RADIAPLEXRX) GEL Apply 1 application topically daily. Apply to affected treated area daily and prn after rad txs      . hydrocortisone cream 1 % Apply 1 application topically 2 (two) times daily as needed for itching (Rash).      Marland Kitchen HYDROmorphone (DILAUDID) 2 MG tablet Take 1 tablet (2 mg total) by mouth every 4 (four) hours as needed for severe pain.  30 tablet  0  . insulin glargine (LANTUS) 100 UNIT/ML injection Inject 42 Units into the skin at bedtime.       Marland Kitchen lactulose (CHRONULAC) 10 GM/15ML solution Take 15 mLs by mouth as needed. Hasn't used as yet,      . lidocaine-prilocaine (EMLA) cream Apply 1 application topically as needed.  30 g  6  . losartan (COZAAR) 100 MG tablet Take 100 mg by mouth every morning.       . metFORMIN (GLUCOPHAGE) 1000 MG tablet Take 1,000 mg by mouth 2  (two) times daily with a meal.      . ondansetron (ZOFRAN) 8 MG tablet Take 1 tablet (8 mg total) by mouth every 8 (eight) hours as needed for nausea.  20 tablet  2  . promethazine (PHENERGAN) 25 MG tablet Take 1 tablet (25 mg total) by mouth every 6 (six) hours as needed for nausea or vomiting.  60 tablet  3  . saxagliptin HCl (ONGLYZA) 5 MG TABS tablet Take 5 mg by mouth daily.      Marland Kitchen senna (SENOKOT) 8.6 MG tablet Take 2 tablets by mouth daily.       . simvastatin (ZOCOR) 20 MG tablet Take 20 mg by mouth every evening.      . Skin Protectants, Misc. (EUCERIN) cream Apply 1 application topically as needed for dry skin.      Marland Kitchen vitamin B-12 (CYANOCOBALAMIN) 1000 MCG tablet Take 1,000 mcg by mouth daily.      . Alum & Mag Hydroxide-Simeth (MAGIC MOUTHWASH W/LIDOCAINE) SOLN Take 5 mLs by mouth 4 (four) times daily.  240 mL  3   No current facility-administered medications for this visit.    PHYSICAL EXAMINATION: ECOG PERFORMANCE STATUS: 1 - Symptomatic but completely ambulatory  Filed Vitals:   07/02/14 1118  BP: 124/69  Pulse: 76  Temp: 97.8 F (36.6 C)  Resp: 17   Filed Weights   07/02/14 1118  Weight: 237 lb 4.8 oz (107.639 kg)    GENERAL:alert, no distress and comfortable SKIN: skin color, texture, turgor are normal, no rashes or significant lesions EYES: normal, Conjunctiva are pink and non-injected, sclera clear OROPHARYNX:no exudate, no erythema and lips, buccal mucosa, and tongue normal  NECK: supple, thyroid normal size, non-tender, without nodularity LYMPH:  no palpable lymphadenopathy in the cervical, axillary or inguinal LUNGS: clear to auscultation and percussion with normal breathing effort HEART: regular rate & rhythm and no murmurs and no lower extremity edema ABDOMEN:abdomen soft, non-tender and normal bowel sounds Musculoskeletal:no cyanosis of digits and no clubbing  NEURO: alert & oriented x 3 with fluent speech, no focal motor/sensory deficits  LABORATORY  DATA:  I have reviewed the data as listed    Component Value Date/Time   NA 138 07/02/2014 1059   NA 137 06/25/2014 1100   K 4.4 07/02/2014 1059   K 4.8 06/25/2014 1100   CL 99 06/25/2014 1100   CL 107 04/27/2013 0926   CO2 22 07/02/2014 1059   CO2 25 06/25/2014 1100   GLUCOSE 244* 07/02/2014 1059   GLUCOSE 291* 06/25/2014 1100   GLUCOSE 212* 04/27/2013 0926   BUN 14.6 07/02/2014 1059   BUN 17 06/25/2014 1100   CREATININE 1.2 07/02/2014 1059   CREATININE 1.27 06/25/2014 1100   CALCIUM 10.1 07/02/2014 1059   CALCIUM 10.8* 06/25/2014 1100   PROT 6.8 07/02/2014 1059   PROT 7.2 06/25/2014 1100   ALBUMIN 3.6 07/02/2014 1059   ALBUMIN 3.7 06/25/2014 1100   AST  22 07/02/2014 1059   AST 22 06/25/2014 1100   ALT 20 07/02/2014 1059   ALT 37 06/25/2014 1100   ALKPHOS 112 07/02/2014 1059   ALKPHOS 173* 06/25/2014 1100   BILITOT 0.72 07/02/2014 1059   BILITOT 0.8 06/25/2014 1100   GFRNONAA 68* 06/08/2014 0437   GFRAA 79* 06/08/2014 0437    No results found for this basename: SPEP, UPEP,  kappa and lambda light chains    Lab Results  Component Value Date   WBC 3.2* 07/02/2014   NEUTROABS 1.5 07/02/2014   HGB 12.7* 07/02/2014   HCT 37.3* 07/02/2014   MCV 96.1 07/02/2014   PLT 104* 07/02/2014      Chemistry      Component Value Date/Time   NA 138 07/02/2014 1059   NA 137 06/25/2014 1100   K 4.4 07/02/2014 1059   K 4.8 06/25/2014 1100   CL 99 06/25/2014 1100   CL 107 04/27/2013 0926   CO2 22 07/02/2014 1059   CO2 25 06/25/2014 1100   BUN 14.6 07/02/2014 1059   BUN 17 06/25/2014 1100   CREATININE 1.2 07/02/2014 1059   CREATININE 1.27 06/25/2014 1100      Component Value Date/Time   CALCIUM 10.1 07/02/2014 1059   CALCIUM 10.8* 06/25/2014 1100   ALKPHOS 112 07/02/2014 1059   ALKPHOS 173* 06/25/2014 1100   AST 22 07/02/2014 1059   AST 22 06/25/2014 1100   ALT 20 07/02/2014 1059   ALT 37 06/25/2014 1100   BILITOT 0.72 07/02/2014 1059   BILITOT 0.8 06/25/2014 1100      ASSESSMENT & PLAN:  Pancreatic cancer He  tolerates the chemotherapy well apart from mild pancytopenia and mild mucositis We will continue current treatment with dose adjustment.  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 He will continue taking his chemotherapy through all this treatment.     Mucositis (ulcerative) due to antineoplastic therapy This is due to treatment. I recommend Magic mouthwash  CKD stage 3 due to type 2 diabetes mellitus Continue close monitoring of his kidney function.  Diabetes mellitus with neuropathy We will continue insulin adjustments to keep that sugar below 150  Leukopenia due to antineoplastic chemotherapy This is likely due to recent treatment. The patient denies recent history of fevers, cough, chills, diarrhea or dysuria. He is asymptomatic from the leukopenia. I will observe for now.  I will continue the chemotherapy at current dose without dosage adjustment.  If the leukopenia gets progressive worse in the future, I might have to delay his treatment or adjust the chemotherapy dose.    Thrombocytopenia due to drugs This is likely due to recent treatment. The patient denies recent history of bleeding such as epistaxis, hematuria or hematochezia. He is asymptomatic from the low platelet count. I will observe for now.  he does not require transfusion now. I will continue the chemotherapy at current dose without dosage adjustment.  If the thrombocytopenia gets progressive worse in the future, I might have to delay his treatment or adjust the chemotherapy dose.    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 The patient denies recent history of bleeding such as epistaxis, hematuria or hematochezia. [name] I will observe for now.  He does not require transfusion now. I will continue the chemotherapy at current dose without dosage adjustment.  If the anemia gets progressive worse in the future, I might have to delay his  treatment or  adjust the chemotherapy dose.    No orders of the defined types were placed in this encounter.   All questions were answered. The patient knows to call the clinic with any problems, questions or concerns. No barriers to learning was detected. I spent 30 minutes counseling the patient face to face. The total time spent in the appointment was 40 minutes and more than 50% was on counseling and review of test results     [facility], [place], MD 07/02/2014 8:03 PM

## 2014-07-03 ENCOUNTER — Telehealth: Payer: Self-pay | Admitting: Dietician

## 2014-07-03 ENCOUNTER — Ambulatory Visit
Admission: RE | Admit: 2014-07-03 | Discharge: 2014-07-03 | Disposition: A | Discharge status: Home / Self Care | Payer: Medicare Other | Source: Ambulatory Visit | Attending: Radiation Oncology | Admitting: Radiation Oncology

## 2014-07-03 DIAGNOSIS — Z510 Encounter for antineoplastic radiation therapy: Secondary | ICD-10-CM | POA: Diagnosis not present

## 2014-07-03 NOTE — Telephone Encounter (Signed)
Brief Outpatient Oncology Nutrition Note  Patient has been identified to be at risk on malnutrition screen.  Wt Readings from Last 10 Encounters:  07/02/14 237 lb 4.8 oz (107.639 kg)  06/29/14 236 lb 12.8 oz (107.412 kg)  06/25/14 236 lb 4.8 oz (107.185 kg)  06/15/14 241 lb 8 oz (109.544 kg)  06/15/14 241 lb 8 oz (109.544 kg)  06/14/14 241 lb 9.6 oz (109.589 kg)  06/14/14 241 lb 9.6 oz (109.589 kg)  06/13/14 241 lb 9.6 oz (109.589 kg)  06/13/14 241 lb 9.6 oz (109.589 kg)  06/05/14 250 lb (113.399 kg)      Dx:  Pancreatic Cancer.  Patient of Dr. Christiana Pellant  Called patient due to weight loss.  Wife reports patient is eating very well and has no issues at this time.  Mucositis noted per chart.  They are to call for any nutrition related questions.  Antonieta Iba, RD, LDN

## 2014-07-04 ENCOUNTER — Ambulatory Visit
Admission: RE | Admit: 2014-07-04 | Discharge: 2014-07-04 | Disposition: A | Discharge status: Home / Self Care | Payer: Medicare Other | Source: Ambulatory Visit | Attending: Radiation Oncology | Admitting: Radiation Oncology

## 2014-07-04 ENCOUNTER — Other Ambulatory Visit: Payer: Self-pay | Admitting: Hematology and Oncology

## 2014-07-04 DIAGNOSIS — C250 Malignant neoplasm of head of pancreas: Secondary | ICD-10-CM

## 2014-07-04 DIAGNOSIS — Z510 Encounter for antineoplastic radiation therapy: Secondary | ICD-10-CM | POA: Diagnosis not present

## 2014-07-04 NOTE — Progress Notes (Signed)
Department of Radiation Oncology  Phone:  [phone] Fax:        [phone]  Weekly Treatment Note    Name: Evan [name] Date: 07/04/2014 MRN: [id number] DOB: 1940-12-25   Current dose: 14.4 Gy  Current fraction: 8   MEDICATIONS: Current Outpatient Prescriptions  Medication Sig Dispense Refill  . acetaminophen (TYLENOL) 325 MG tablet Take 650 mg by mouth every 6 (six) hours as needed for moderate pain.      Marland Kitchen Alum & Mag Hydroxide-Simeth (MAGIC MOUTHWASH W/LIDOCAINE) SOLN Take 5 mLs by mouth 4 (four) times daily.  240 mL  3  . aspirin EC 81 MG tablet Take 81 mg by mouth daily.      . B Complex-C (SUPER B COMPLEX/VITAMIN C PO) Take 1 tablet by mouth daily.       . cholecalciferol (VITAMIN D) 1000 UNITS tablet Take 1,000 Units by mouth daily.      . dasatinib (SPRYCEL) 50 MG tablet Take 50 mg by mouth at bedtime.      . diphenhydrAMINE (BENADRYL) 25 mg capsule Take 1 capsule (25 mg total) by mouth every 6 (six) hours as needed for itching.  30 capsule  0  . hyaluronate sodium (RADIAPLEXRX) GEL Apply 1 application topically daily. Apply to affected treated area daily and prn after rad txs      . hydrocortisone cream 1 % Apply 1 application topically 2 (two) times daily as needed for itching (Rash).      Marland Kitchen HYDROmorphone (DILAUDID) 2 MG tablet Take 1 tablet (2 mg total) by mouth every 4 (four) hours as needed for severe pain.  30 tablet  0  . insulin glargine (LANTUS) 100 UNIT/ML injection Inject 42 Units into the skin at bedtime.       Marland Kitchen lactulose (CHRONULAC) 10 GM/15ML solution Take 15 mLs by mouth as needed. Hasn't used as yet,      . lidocaine-prilocaine (EMLA) cream Apply 1 application topically as needed.  30 g  6  . losartan (COZAAR) 100 MG tablet Take 100 mg by mouth every morning.       . metFORMIN (GLUCOPHAGE) 1000 MG tablet Take 1,000 mg by mouth 2 (two) times daily with a meal.      . ondansetron (ZOFRAN) 8 MG tablet Take 1 tablet (8 mg total) by mouth every 8  (eight) hours as needed for nausea.  20 tablet  2  . promethazine (PHENERGAN) 25 MG tablet Take 1 tablet (25 mg total) by mouth every 6 (six) hours as needed for nausea or vomiting.  60 tablet  3  . saxagliptin HCl (ONGLYZA) 5 MG TABS tablet Take 5 mg by mouth daily.      Marland Kitchen senna (SENOKOT) 8.6 MG tablet Take 2 tablets by mouth daily.       . simvastatin (ZOCOR) 20 MG tablet Take 20 mg by mouth every evening.      . Skin Protectants, Misc. (EUCERIN) cream Apply 1 application topically as needed for dry skin.      Marland Kitchen vitamin B-12 (CYANOCOBALAMIN) 1000 MCG tablet Take 1,000 mcg by mouth daily.       No current facility-administered medications for this encounter.     ALLERGIES: Ace inhibitors and Ciprofloxacin   LABORATORY DATA:  Lab Results  Component Value Date   WBC 3.2* 07/02/2014   HGB 12.7* 07/02/2014   HCT 37.3* 07/02/2014   MCV 96.1 07/02/2014   PLT 104* 07/02/2014   Lab Results  Component Value Date  NA 138 07/02/2014   K 4.4 07/02/2014   CL 99 06/25/2014   CO2 22 07/02/2014   Lab Results  Component Value Date   ALT 20 07/02/2014   AST 22 07/02/2014   ALKPHOS 112 07/02/2014   BILITOT 0.72 07/02/2014     NARRATIVE: Evan [name] was seen today for weekly treatment management. The chart was checked and the patient's films were reviewed. The patient states he is doing very well. No nausea. No diarrhea.  PHYSICAL EXAMINATION: vitals were not taken for this visit.     alert, in no acute distress. In good spirits.  ASSESSMENT: The patient is doing satisfactorily with treatment.  PLAN: We will continue with the patient's radiation treatment as planned.

## 2014-07-04 NOTE — Progress Notes (Addendum)
Weekly rad tx 8/28 pancreas, no c/o pain or nausea, bm's every other day, TAKES SENOKOT S just started biotene,  Has rash on arms from chemotherapy on Mondays,  Has RADIAPLEX  not using as yet, 10:05 AM

## 2014-07-05 ENCOUNTER — Ambulatory Visit
Admission: RE | Admit: 2014-07-05 | Discharge: 2014-07-05 | Disposition: A | Discharge status: Home / Self Care | Payer: Medicare Other | Source: Ambulatory Visit | Attending: Radiation Oncology | Admitting: Radiation Oncology

## 2014-07-05 DIAGNOSIS — Z510 Encounter for antineoplastic radiation therapy: Secondary | ICD-10-CM | POA: Diagnosis not present

## 2014-07-06 ENCOUNTER — Encounter: Payer: Self-pay | Admitting: Hematology and Oncology

## 2014-07-06 ENCOUNTER — Ambulatory Visit (HOSPITAL_BASED_OUTPATIENT_CLINIC_OR_DEPARTMENT_OTHER): Payer: Medicare Other | Admitting: Hematology and Oncology

## 2014-07-06 ENCOUNTER — Ambulatory Visit
Admission: RE | Admit: 2014-07-06 | Discharge: 2014-07-06 | Disposition: A | Discharge status: Home / Self Care | Payer: Medicare Other | Source: Ambulatory Visit | Attending: Radiation Oncology | Admitting: Radiation Oncology

## 2014-07-06 VITALS — BP 137/68 | HR 94 | Temp 98.4°F | Resp 18 | Ht 74.0 in | Wt 236.1 lb

## 2014-07-06 DIAGNOSIS — C250 Malignant neoplasm of head of pancreas: Secondary | ICD-10-CM

## 2014-07-06 DIAGNOSIS — R21 Rash and other nonspecific skin eruption: Secondary | ICD-10-CM

## 2014-07-06 DIAGNOSIS — Z510 Encounter for antineoplastic radiation therapy: Secondary | ICD-10-CM | POA: Diagnosis not present

## 2014-07-06 NOTE — Assessment & Plan Note (Signed)
This is related to skin sensitivity during treatment. The rash appears to be confined to sun exposed area. I recommend the patient avoid sun exposure as much as possible and to wear long sleeves. I recommend topical emollient cream to reduce the dryness and for him to take over-the-counter Benadryl as needed if it causes itchiness. I reassured the patient.

## 2014-07-06 NOTE — Progress Notes (Signed)
[facility] OFFICE PROGRESS NOTE  Patient Care Team: [name], MD as PCP - General (Internal Medicine) [name], MD as Referring Physician (Specialist) Provider Not In System [name], MD as Consulting Physician (Hematology and Oncology)  SUMMARY OF ONCOLOGIC HISTORY: Oncology History   Pancreatic cancer   Primary site: Pancreas   Staging method: AJCC 7th Edition   Clinical: Stage IIA (T3, N0, M0) signed by [name], MD on 06/13/2014  4:37 PM   Summary: Stage IIA (T3, N0, M0)       Pancreatic cancer   06/05/2014 Imaging  MRI abdomen showed 2.2 cm lesion in the pancreatic head obstructing the common bile duct and the pancreatic duct is highly concerning for pancreatic adenocarcinoma.  There is moderate intra and extrahepatic biliary duct dilatation.    06/05/2014 Tumor Marker CA 19-9 is elevated at 1234   06/06/2014 Imaging Ct scan of chest showed 2 lung nodules, likely benign   06/07/2014 Procedure He had ERCP and stent placement   06/07/2014 Procedure He had endoscopic ultrasound with  fine needle aspiration.   06/07/2014 Pathology Results Accession: [id number] FNA is positive for pancreatic adenoca   06/14/2014 Surgery He has port placement   06/25/2014 -  Chemotherapy He received neoadjuvant chemotherapy with Gemzar   06/25/2014 -  Radiation Therapy he received neoadjuvant radiation    INTERVAL HISTORY: Please see below for problem oriented charting. He is seen urgently today because of new rash on his skin. The skin appears to be confined to both lower arms and a little bit on his face. It is mildly itchy.  REVIEW OF SYSTEMS:   Constitutional: Denies fevers, chills or abnormal weight loss Eyes: Denies blurriness of vision Ears, nose, mouth, throat, and face: Denies mucositis or sore throat Respiratory: Denies cough, dyspnea or wheezes Cardiovascular: Denies palpitation, chest discomfort or lower extremity swelling Gastrointestinal:  Denies nausea,  heartburn or change in bowel habits Lymphatics: Denies new lymphadenopathy or easy bruising Neurological:Denies numbness, tingling or new weaknesses Behavioral/Psych: Mood is stable, no new changes  All other systems were reviewed with the patient and are negative.  I have reviewed the past medical history, past surgical history, social history and family history with the patient and they are unchanged from previous note.  ALLERGIES:  is allergic to ace inhibitors and ciprofloxacin.  MEDICATIONS:  Current Outpatient Prescriptions  Medication Sig Dispense Refill  . acetaminophen (TYLENOL) 325 MG tablet Take 650 mg by mouth every 6 (six) hours as needed for moderate pain.      Marland Kitchen Alum & Mag Hydroxide-Simeth (MAGIC MOUTHWASH W/LIDOCAINE) SOLN Take 5 mLs by mouth 4 (four) times daily.  240 mL  3  . aspirin EC 81 MG tablet Take 81 mg by mouth daily.      . B Complex-C (SUPER B COMPLEX/VITAMIN C PO) Take 1 tablet by mouth daily.       . cholecalciferol (VITAMIN D) 1000 UNITS tablet Take 1,000 Units by mouth daily.      . dasatinib (SPRYCEL) 50 MG tablet Take 50 mg by mouth at bedtime.      . diphenhydrAMINE (BENADRYL) 25 mg capsule Take 1 capsule (25 mg total) by mouth every 6 (six) hours as needed for itching.  30 capsule  0  . hyaluronate sodium (RADIAPLEXRX) GEL Apply 1 application topically daily. Apply to affected treated area daily and prn after rad txs      . hydrocortisone cream 1 % Apply 1 application topically 2 (two) times  daily as needed for itching (Rash).      . insulin glargine (LANTUS) 100 UNIT/ML injection Inject 42 Units into the skin at bedtime.       . lidocaine-prilocaine (EMLA) cream Apply 1 application topically as needed.  30 g  6  . losartan (COZAAR) 100 MG tablet Take 100 mg by mouth every morning.       . metFORMIN (GLUCOPHAGE) 1000 MG tablet Take 1,000 mg by mouth 2 (two) times daily with a meal.      . saxagliptin HCl (ONGLYZA) 5 MG TABS tablet Take 5 mg by mouth  daily.      Marland Kitchen senna (SENOKOT) 8.6 MG tablet Take 2 tablets by mouth daily.       . simvastatin (ZOCOR) 20 MG tablet Take 20 mg by mouth every evening.      . Skin Protectants, Misc. (EUCERIN) cream Apply 1 application topically as needed for dry skin.      Marland Kitchen vitamin B-12 (CYANOCOBALAMIN) 1000 MCG tablet Take 1,000 mcg by mouth daily.      Marland Kitchen HYDROmorphone (DILAUDID) 2 MG tablet Take 1 tablet (2 mg total) by mouth every 4 (four) hours as needed for severe pain.  30 tablet  0  . lactulose (CHRONULAC) 10 GM/15ML solution Take 15 mLs by mouth as needed. Hasn't used as yet,      . ondansetron (ZOFRAN) 8 MG tablet Take 1 tablet (8 mg total) by mouth every 8 (eight) hours as needed for nausea.  20 tablet  2  . promethazine (PHENERGAN) 25 MG tablet Take 1 tablet (25 mg total) by mouth every 6 (six) hours as needed for nausea or vomiting.  60 tablet  3   No current facility-administered medications for this visit.    PHYSICAL EXAMINATION: ECOG PERFORMANCE STATUS: 0 - Asymptomatic  Filed Vitals:   07/06/14 0820  BP: 137/68  Pulse: 94  Temp: 98.4 F (36.9 C)  Resp: 18   Filed Weights   07/06/14 0820  Weight: 236 lb 1.6 oz (107.094 kg)    GENERAL:alert, no distress and comfortable. He is morbidly obese SKIN: There is mild dermatitis on both forearms especially on the extensor surfaces. There is mild dermatitis on his face as well. No other rash elsewhere. There is no ulceration. EYES: normal, Conjunctiva are pink and non-injected, sclera clear Musculoskeletal:no cyanosis of digits and no clubbing  NEURO: alert & oriented x 3 with fluent speech, no focal motor/sensory deficits  LABORATORY DATA:  I have reviewed the data as listed    Component Value Date/Time   NA 138 07/02/2014 1059   NA 137 06/25/2014 1100   K 4.4 07/02/2014 1059   K 4.8 06/25/2014 1100   CL 99 06/25/2014 1100   CL 107 04/27/2013 0926   CO2 22 07/02/2014 1059   CO2 25 06/25/2014 1100   GLUCOSE 244* 07/02/2014 1059   GLUCOSE  291* 06/25/2014 1100   GLUCOSE 212* 04/27/2013 0926   BUN 14.6 07/02/2014 1059   BUN 17 06/25/2014 1100   CREATININE 1.2 07/02/2014 1059   CREATININE 1.27 06/25/2014 1100   CALCIUM 10.1 07/02/2014 1059   CALCIUM 10.8* 06/25/2014 1100   PROT 6.8 07/02/2014 1059   PROT 7.2 06/25/2014 1100   ALBUMIN 3.6 07/02/2014 1059   ALBUMIN 3.7 06/25/2014 1100   AST 22 07/02/2014 1059   AST 22 06/25/2014 1100   ALT 20 07/02/2014 1059   ALT 37 06/25/2014 1100   ALKPHOS 112 07/02/2014 1059   ALKPHOS 173*  06/25/2014 1100   BILITOT 0.72 07/02/2014 1059   BILITOT 0.8 06/25/2014 1100   GFRNONAA 68* 06/08/2014 0437   GFRAA 79* 06/08/2014 0437    No results found for this basename: SPEP, UPEP,  kappa and lambda light chains    Lab Results  Component Value Date   WBC 3.2* 07/02/2014   NEUTROABS 1.5 07/02/2014   HGB 12.7* 07/02/2014   HCT 37.3* 07/02/2014   MCV 96.1 07/02/2014   PLT 104* 07/02/2014      Chemistry      Component Value Date/Time   NA 138 07/02/2014 1059   NA 137 06/25/2014 1100   K 4.4 07/02/2014 1059   K 4.8 06/25/2014 1100   CL 99 06/25/2014 1100   CL 107 04/27/2013 0926   CO2 22 07/02/2014 1059   CO2 25 06/25/2014 1100   BUN 14.6 07/02/2014 1059   BUN 17 06/25/2014 1100   CREATININE 1.2 07/02/2014 1059   CREATININE 1.27 06/25/2014 1100      Component Value Date/Time   CALCIUM 10.1 07/02/2014 1059   CALCIUM 10.8* 06/25/2014 1100   ALKPHOS 112 07/02/2014 1059   ALKPHOS 173* 06/25/2014 1100   AST 22 07/02/2014 1059   AST 22 06/25/2014 1100   ALT 20 07/02/2014 1059   ALT 37 06/25/2014 1100   BILITOT 0.72 07/02/2014 1059   BILITOT 0.8 06/25/2014 1100     ASSESSMENT & PLAN:  Rash This is related to skin sensitivity during treatment. The rash appears to be confined to sun exposed area. I recommend the patient avoid sun exposure as much as possible and to wear long sleeves. I recommend topical emollient cream to reduce the dryness and for him to take over-the-counter Benadryl as needed if it causes itchiness. I  reassured the patient.  He will continue his chemoradiation therapy without interruption. No orders of the defined types were placed in this encounter.   All questions were answered. The patient knows to call the clinic with any problems, questions or concerns. No barriers to learning was detected. I spent 15 minutes counseling the patient face to face. The total time spent in the appointment was 20 minutes and more than 50% was on counseling and review of test results     [facility], [place], MD 07/06/2014 8:45 AM

## 2014-07-09 ENCOUNTER — Telehealth: Payer: Self-pay | Admitting: Hematology and Oncology

## 2014-07-09 ENCOUNTER — Other Ambulatory Visit: Payer: Medicare Other

## 2014-07-09 ENCOUNTER — Ambulatory Visit (HOSPITAL_BASED_OUTPATIENT_CLINIC_OR_DEPARTMENT_OTHER): Payer: Medicare Other | Admitting: Hematology and Oncology

## 2014-07-09 ENCOUNTER — Other Ambulatory Visit (HOSPITAL_BASED_OUTPATIENT_CLINIC_OR_DEPARTMENT_OTHER): Payer: Medicare Other

## 2014-07-09 ENCOUNTER — Ambulatory Visit: Payer: Medicare Other

## 2014-07-09 ENCOUNTER — Ambulatory Visit (HOSPITAL_BASED_OUTPATIENT_CLINIC_OR_DEPARTMENT_OTHER): Payer: Medicare Other

## 2014-07-09 ENCOUNTER — Ambulatory Visit
Admission: RE | Admit: 2014-07-09 | Discharge: 2014-07-09 | Disposition: A | Discharge status: Home / Self Care | Payer: Medicare Other | Source: Ambulatory Visit | Attending: Radiation Oncology | Admitting: Radiation Oncology

## 2014-07-09 ENCOUNTER — Ambulatory Visit: Payer: Medicare Other | Admitting: Nutrition

## 2014-07-09 ENCOUNTER — Other Ambulatory Visit: Payer: Self-pay | Admitting: Hematology and Oncology

## 2014-07-09 VITALS — BP 130/66 | HR 87 | Temp 98.2°F | Resp 18 | Ht 74.0 in | Wt 234.9 lb

## 2014-07-09 DIAGNOSIS — D6959 Other secondary thrombocytopenia: Secondary | ICD-10-CM

## 2014-07-09 DIAGNOSIS — R21 Rash and other nonspecific skin eruption: Secondary | ICD-10-CM

## 2014-07-09 DIAGNOSIS — C250 Malignant neoplasm of head of pancreas: Secondary | ICD-10-CM

## 2014-07-09 DIAGNOSIS — T451X5A Adverse effect of antineoplastic and immunosuppressive drugs, initial encounter: Secondary | ICD-10-CM

## 2014-07-09 DIAGNOSIS — Z5111 Encounter for antineoplastic chemotherapy: Secondary | ICD-10-CM

## 2014-07-09 DIAGNOSIS — T50905A Adverse effect of unspecified drugs, medicaments and biological substances, initial encounter: Secondary | ICD-10-CM

## 2014-07-09 DIAGNOSIS — Z510 Encounter for antineoplastic radiation therapy: Secondary | ICD-10-CM | POA: Diagnosis not present

## 2014-07-09 DIAGNOSIS — D701 Agranulocytosis secondary to cancer chemotherapy: Secondary | ICD-10-CM

## 2014-07-09 DIAGNOSIS — C9211 Chronic myeloid leukemia, BCR/ABL-positive, in remission: Secondary | ICD-10-CM

## 2014-07-09 DIAGNOSIS — E1149 Type 2 diabetes mellitus with other diabetic neurological complication: Secondary | ICD-10-CM

## 2014-07-09 DIAGNOSIS — Z95828 Presence of other vascular implants and grafts: Secondary | ICD-10-CM

## 2014-07-09 DIAGNOSIS — E1140 Type 2 diabetes mellitus with diabetic neuropathy, unspecified: Secondary | ICD-10-CM

## 2014-07-09 DIAGNOSIS — E1142 Type 2 diabetes mellitus with diabetic polyneuropathy: Secondary | ICD-10-CM

## 2014-07-09 DIAGNOSIS — D72819 Decreased white blood cell count, unspecified: Secondary | ICD-10-CM

## 2014-07-09 LAB — COMPREHENSIVE METABOLIC PANEL (CC13)
ALBUMIN: 3.6 g/dL (ref 3.5–5.0)
ALT: 18 U/L (ref 0–55)
ANION GAP: 10 meq/L (ref 3–11)
AST: 20 U/L (ref 5–34)
Alkaline Phosphatase: 107 U/L (ref 40–150)
BUN: 13.4 mg/dL (ref 7.0–26.0)
CALCIUM: 10.1 mg/dL (ref 8.4–10.4)
CHLORIDE: 104 meq/L (ref 98–109)
CO2: 24 meq/L (ref 22–29)
CREATININE: 1.2 mg/dL (ref 0.7–1.3)
Glucose: 210 mg/dl — ABNORMAL HIGH (ref 70–140)
POTASSIUM: 4 meq/L (ref 3.5–5.1)
Sodium: 138 mEq/L (ref 136–145)
Total Bilirubin: 1.04 mg/dL (ref 0.20–1.20)
Total Protein: 6.9 g/dL (ref 6.4–8.3)

## 2014-07-09 LAB — CBC WITH DIFFERENTIAL/PLATELET
BASO%: 0.6 % (ref 0.0–2.0)
BASOS ABS: 0 10*3/uL (ref 0.0–0.1)
EOS%: 1.3 % (ref 0.0–7.0)
Eosinophils Absolute: 0 10*3/uL (ref 0.0–0.5)
HEMATOCRIT: 35.4 % — AB (ref 38.4–49.9)
HGB: 12.2 g/dL — ABNORMAL LOW (ref 13.0–17.1)
LYMPH#: 0.8 10*3/uL — AB (ref 0.9–3.3)
LYMPH%: 32.6 % (ref 14.0–49.0)
MCH: 32.9 pg (ref 27.2–33.4)
MCHC: 34.4 g/dL (ref 32.0–36.0)
MCV: 95.6 fL (ref 79.3–98.0)
MONO#: 0.3 10*3/uL (ref 0.1–0.9)
MONO%: 12.7 % (ref 0.0–14.0)
NEUT#: 1.4 10*3/uL — ABNORMAL LOW (ref 1.5–6.5)
NEUT%: 52.8 % (ref 39.0–75.0)
Platelets: 93 10*3/uL — ABNORMAL LOW (ref 140–400)
RBC: 3.7 10*6/uL — ABNORMAL LOW (ref 4.20–5.82)
RDW: 13.4 % (ref 11.0–14.6)
WBC: 2.6 10*3/uL — AB (ref 4.0–10.3)

## 2014-07-09 MED ORDER — PROCHLORPERAZINE MALEATE 10 MG PO TABS
ORAL_TABLET | ORAL | Status: AC
  Filled 2014-07-09: qty 1

## 2014-07-09 MED ORDER — PROCHLORPERAZINE MALEATE 10 MG PO TABS
10.0000 mg | ORAL_TABLET | Freq: Once | ORAL | Status: AC
Start: 2014-07-09 — End: 2014-07-09
  Administered 2014-07-09: 10 mg via ORAL

## 2014-07-09 MED ORDER — HEPARIN SOD (PORK) LOCK FLUSH 100 UNIT/ML IV SOLN
500.0000 [IU] | Freq: Once | INTRAVENOUS | Status: AC | PRN
  Administered 2014-07-09: 500 [IU]
  Filled 2014-07-09: qty 5

## 2014-07-09 MED ORDER — SODIUM CHLORIDE 0.9 % IJ SOLN
10.0000 mL | INTRAMUSCULAR | Status: DC | PRN
  Administered 2014-07-09: 10 mL via INTRAVENOUS
  Filled 2014-07-09: qty 10

## 2014-07-09 MED ORDER — SODIUM CHLORIDE 0.9 % IV SOLN
Freq: Once | INTRAVENOUS | Status: AC
  Administered 2014-07-09: 11:00:00 via INTRAVENOUS

## 2014-07-09 MED ORDER — SODIUM CHLORIDE 0.9 % IV SOLN
800.0000 mg/m2 | Freq: Once | INTRAVENOUS | Status: AC
  Administered 2014-07-09: 1900 mg via INTRAVENOUS
  Filled 2014-07-09: qty 49.97

## 2014-07-09 MED ORDER — SODIUM CHLORIDE 0.9 % IJ SOLN
10.0000 mL | INTRAMUSCULAR | Status: DC | PRN
  Administered 2014-07-09: 10 mL
  Filled 2014-07-09: qty 10

## 2014-07-09 NOTE — Assessment & Plan Note (Signed)
This is related to skin sensitivity during treatment. The rash appears to be confined to sun exposed area. I recommend the patient avoid sun exposure as much as possible and to wear long sleeves. I recommend topical emollient cream to reduce the dryness and for him to take over-the-counter Benadryl as needed if it causes itchiness. I reassured the patient.

## 2014-07-09 NOTE — Assessment & Plan Note (Signed)
We will continue insulin adjustments to keep that sugar below 150

## 2014-07-09 NOTE — Progress Notes (Signed)
[name] OFFICE PROGRESS NOTE  Patient Care Team: [name], MD as PCP - General (Internal Medicine) [name], MD as Referring Physician (Specialist) Provider Not In System [name], MD as Consulting Physician (Hematology and Oncology)  SUMMARY OF ONCOLOGIC HISTORY: Oncology History   Pancreatic cancer   Primary site: Pancreas   Staging method: AJCC 7th Edition   Clinical: Stage IIA (T3, N0, M0) signed by [name], MD on 06/13/2014  4:37 PM   Summary: Stage IIA (T3, N0, M0)       Pancreatic cancer   06/05/2014 Imaging  MRI abdomen showed 2.2 cm lesion in the pancreatic head obstructing the common bile duct and the pancreatic duct is highly concerning for pancreatic adenocarcinoma.  There is moderate intra and extrahepatic biliary duct dilatation.    06/05/2014 Tumor Marker CA 19-9 is elevated at 1234   06/06/2014 Imaging Ct scan of chest showed 2 lung nodules, likely benign   06/07/2014 Procedure He had ERCP and stent placement   06/07/2014 Procedure He had endoscopic ultrasound with  fine needle aspiration.   06/07/2014 Pathology Results Accession: [id number] FNA is positive for pancreatic adenoca   06/14/2014 Surgery He has port placement   06/25/2014 -  Chemotherapy He received neoadjuvant chemotherapy with Gemzar   06/25/2014 -  Radiation Therapy he received neoadjuvant radiation    INTERVAL HISTORY: Please see below for problem oriented charting. He is seen prior to cycle 3 of therapy. His skin rash is stable. He denies any nausea vomiting. Has mild constipation. His blood sugar appears to be well controlled.  REVIEW OF SYSTEMS:   Constitutional: Denies fevers, chills or abnormal weight loss Eyes: Denies blurriness of vision Ears, nose, mouth, throat, and face: Denies mucositis or sore throat Respiratory: Denies cough, dyspnea or wheezes Cardiovascular: Denies palpitation, chest discomfort or lower extremity swelling Lymphatics: Denies new  lymphadenopathy or easy bruising Neurological:Denies numbness, tingling or new weaknesses Behavioral/Psych: Mood is stable, no new changes  All other systems were reviewed with the patient and are negative.  I have reviewed the past medical history, past surgical history, social history and family history with the patient and they are unchanged from previous note.  ALLERGIES:  is allergic to ace inhibitors and ciprofloxacin.  MEDICATIONS:  Current Outpatient Prescriptions  Medication Sig Dispense Refill  . acetaminophen (TYLENOL) 325 MG tablet Take 650 mg by mouth every 6 (six) hours as needed for moderate pain.      Marland Kitchen Alum & Mag Hydroxide-Simeth (MAGIC MOUTHWASH W/LIDOCAINE) SOLN Take 5 mLs by mouth 4 (four) times daily.  240 mL  3  . aspirin EC 81 MG tablet Take 81 mg by mouth daily.      . B Complex-C (SUPER B COMPLEX/VITAMIN C PO) Take 1 tablet by mouth daily.       . cholecalciferol (VITAMIN D) 1000 UNITS tablet Take 1,000 Units by mouth daily.      . dasatinib (SPRYCEL) 50 MG tablet Take 50 mg by mouth at bedtime.      . diphenhydrAMINE (BENADRYL) 25 mg capsule Take 1 capsule (25 mg total) by mouth every 6 (six) hours as needed for itching.  30 capsule  0  . hyaluronate sodium (RADIAPLEXRX) GEL Apply 1 application topically daily. Apply to affected treated area daily and prn after rad txs      . hydrocortisone cream 1 % Apply 1 application topically 2 (two) times daily as needed for itching (Rash).      Marland Kitchen HYDROmorphone (  DILAUDID) 2 MG tablet Take 1 tablet (2 mg total) by mouth every 4 (four) hours as needed for severe pain.  30 tablet  0  . insulin glargine (LANTUS) 100 UNIT/ML injection Inject 42 Units into the skin at bedtime.       Marland Kitchen lactulose (CHRONULAC) 10 GM/15ML solution Take 15 mLs by mouth as needed. Hasn't used as yet,      . lidocaine-prilocaine (EMLA) cream Apply 1 application topically as needed.  30 g  6  . losartan (COZAAR) 100 MG tablet Take 100 mg by mouth every  morning.       . metFORMIN (GLUCOPHAGE) 1000 MG tablet Take 1,000 mg by mouth 2 (two) times daily with a meal.      . ondansetron (ZOFRAN) 8 MG tablet Take 1 tablet (8 mg total) by mouth every 8 (eight) hours as needed for nausea.  20 tablet  2  . promethazine (PHENERGAN) 25 MG tablet Take 1 tablet (25 mg total) by mouth every 6 (six) hours as needed for nausea or vomiting.  60 tablet  3  . saxagliptin HCl (ONGLYZA) 5 MG TABS tablet Take 5 mg by mouth daily.      Marland Kitchen senna (SENOKOT) 8.6 MG tablet Take 2 tablets by mouth daily.       . simvastatin (ZOCOR) 20 MG tablet Take 20 mg by mouth every evening.      . Skin Protectants, Misc. (EUCERIN) cream Apply 1 application topically as needed for dry skin.      Marland Kitchen vitamin B-12 (CYANOCOBALAMIN) 1000 MCG tablet Take 1,000 mcg by mouth daily.       No current facility-administered medications for this visit.   Facility-Administered Medications Ordered in Other Visits  Medication Dose Route Frequency Provider Last Rate Last Dose  . heparin lock flush 100 unit/mL  500 Units Intracatheter Once PRN [name], MD      . sodium chloride 0.9 % injection 10 mL  10 mL Intracatheter PRN [name], MD        PHYSICAL EXAMINATION: ECOG PERFORMANCE STATUS: 1 - Symptomatic but completely ambulatory  Filed Vitals:   07/09/14 1025  BP: 130/66  Pulse: 87  Temp: 98.2 F (36.8 C)  Resp: 18   Filed Weights   07/09/14 1025  Weight: 234 lb 14.4 oz (106.55 kg)    GENERAL:alert, no distress and comfortable SKIN: Mild skin rash on sun exposed areas, stable EYES: normal, Conjunctiva are pink and non-injected, sclera clear OROPHARYNX:no exudate, no erythema and lips, buccal mucosa, and tongue normal  NECK: supple, thyroid normal size, non-tender, without nodularity LYMPH:  no palpable lymphadenopathy in the cervical, axillary or inguinal LUNGS: clear to auscultation and percussion with normal breathing effort HEART: regular rate & rhythm and no murmurs and no  lower extremity edema ABDOMEN:abdomen soft, non-tender and normal bowel sounds Musculoskeletal:no cyanosis of digits and no clubbing  NEURO: alert & oriented x 3 with fluent speech, no focal motor/sensory deficits  LABORATORY DATA:  I have reviewed the data as listed    Component Value Date/Time   NA 138 07/09/2014 0947   NA 137 06/25/2014 1100   K 4.0 07/09/2014 0947   K 4.8 06/25/2014 1100   CL 99 06/25/2014 1100   CL 107 04/27/2013 0926   CO2 24 07/09/2014 0947   CO2 25 06/25/2014 1100   GLUCOSE 210* 07/09/2014 0947   GLUCOSE 291* 06/25/2014 1100   GLUCOSE 212* 04/27/2013 0926   BUN 13.4 07/09/2014 0947   BUN  17 06/25/2014 1100   CREATININE 1.2 07/09/2014 0947   CREATININE 1.27 06/25/2014 1100   CALCIUM 10.1 07/09/2014 0947   CALCIUM 10.8* 06/25/2014 1100   PROT 6.9 07/09/2014 0947   PROT 7.2 06/25/2014 1100   ALBUMIN 3.6 07/09/2014 0947   ALBUMIN 3.7 06/25/2014 1100   AST 20 07/09/2014 0947   AST 22 06/25/2014 1100   ALT 18 07/09/2014 0947   ALT 37 06/25/2014 1100   ALKPHOS 107 07/09/2014 0947   ALKPHOS 173* 06/25/2014 1100   BILITOT 1.04 07/09/2014 0947   BILITOT 0.8 06/25/2014 1100   GFRNONAA 68* 06/08/2014 0437   GFRAA 79* 06/08/2014 0437    No results found for this basename: SPEP, UPEP,  kappa and lambda light chains    Lab Results  Component Value Date   WBC 2.6* 07/09/2014   NEUTROABS 1.4* 07/09/2014   HGB 12.2* 07/09/2014   HCT 35.4* 07/09/2014   MCV 95.6 07/09/2014   PLT 93* 07/09/2014      Chemistry      Component Value Date/Time   NA 138 07/09/2014 0947   NA 137 06/25/2014 1100   K 4.0 07/09/2014 0947   K 4.8 06/25/2014 1100   CL 99 06/25/2014 1100   CL 107 04/27/2013 0926   CO2 24 07/09/2014 0947   CO2 25 06/25/2014 1100   BUN 13.4 07/09/2014 0947   BUN 17 06/25/2014 1100   CREATININE 1.2 07/09/2014 0947   CREATININE 1.27 06/25/2014 1100      Component Value Date/Time   CALCIUM 10.1 07/09/2014 0947   CALCIUM 10.8* 06/25/2014 1100   ALKPHOS 107 07/09/2014 0947   ALKPHOS 173*  06/25/2014 1100   AST 20 07/09/2014 0947   AST 22 06/25/2014 1100   ALT 18 07/09/2014 0947   ALT 37 06/25/2014 1100   BILITOT 1.04 07/09/2014 0947   BILITOT 0.8 06/25/2014 1100      ASSESSMENT & PLAN:  Malignant neoplasm of head of pancreas He tolerates the chemotherapy well apart from mild pancytopenia and mild mucositis We will continue current treatment without dose adjustment.    Leukopenia due to antineoplastic chemotherapy This is likely due to recent treatment. The patient denies recent history of fevers, cough, chills, diarrhea or dysuria. He is asymptomatic from the leukopenia. I will observe for now.  I will continue the chemotherapy at current dose without dosage adjustment.  If the leukopenia gets progressive worse in the future, I might have to delay his treatment or adjust the chemotherapy dose.      Thrombocytopenia due to drugs This is likely due to recent treatment. The patient denies recent history of bleeding such as epistaxis, hematuria or hematochezia. He is asymptomatic from the low platelet count. I will observe for now.  he does not require transfusion now. I will continue the chemotherapy at current dose without dosage adjustment.  If the thrombocytopenia gets progressive worse in the future, I might have to delay his treatment or adjust the chemotherapy dose.      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 He will continue taking his chemotherapy through all this treatment.       Diabetes mellitus with neuropathy We will continue insulin adjustments to keep that sugar below 150    Rash This is related to skin sensitivity during treatment. The rash appears to be confined to sun exposed area. I recommend the patient avoid sun exposure as much as possible and to wear long sleeves. I recommend  topical emollient cream to reduce the dryness and for him to take over-the-counter Benadryl as needed if it causes itchiness. I  reassured the patient.     No orders of the defined types were placed in this encounter.   All questions were answered. The patient knows to call the clinic with any problems, questions or concerns. No barriers to learning was detected. I spent 25 minutes counseling the patient face to face. The total time spent in the appointment was 30 minutes and more than 50% was on counseling and review of test results     [facility], [place], MD 07/09/2014 12:52 PM

## 2014-07-09 NOTE — Patient Instructions (Signed)

## 2014-07-09 NOTE — Progress Notes (Signed)
73 year old male diagnosed with cancer of the pancreas in July 2015.  He is a patient of Dr. Alvy Bimler.  Past medical history includes chronic back pain, diabetes, hypertension, hypercholesterolemia, GERD, pancreatitis, anxiety, degenerative disc disease, tobacco, and CML in 2013.  Medications include B complex vitamin, vitamin D, Lantus, Chronulac, Glucophage, Zofran, Phenergan, Zocor, Senokot, and vitamin B12.  Labs include glucose 244 on August 17.  Height: 6 feet 2 inches. Weight: 234.9 pounds. Usual body weight: 250 pounds. BMI: 30.15.  Met with patient and wife, during chemotherapy.  Patient is a very "picky eater."  His main complaint is that food is starting to taste bland.  His favorite food is meat and potatoes.  Dietary recall reveals patient will eat cheese and ice cream.  Weight loss occurred after biopsy.  Nutrition diagnosis: Food and nutrition related knowledge deficit related to diagnosis of pancreas cancer and associated treatments as evidenced by no prior need for nutrition related information.  Intervention:  Patient education on the importance of small, frequent meals and snacks throughout the day.  Recommended patient continue high-protein foods. Discuss food safety. Educated patient on strategies for improving taste.  Provided fact sheet. Questions answered.  Teach back method used.  Contact information was given.  Monitoring, evaluation, goals: Patient will tolerate adequate calories and protein for minimal weight loss throughout treatment.  Next visit: Monday, August 31, during chemotherapy.  **Disclaimer: This note was dictated with voice recognition software. Similar sounding words can inadvertently be transcribed and this note may contain transcription errors which may not have been corrected upon publication of note.**

## 2014-07-09 NOTE — Assessment & Plan Note (Signed)
This is likely due to recent treatment. The patient denies recent history of bleeding such as epistaxis, hematuria or hematochezia. He is asymptomatic from the low platelet count. I will observe for now.  he does not require transfusion now. I will continue the chemotherapy at current dose without dosage adjustment.  If the thrombocytopenia gets progressive worse in the future, I might have to delay his treatment or adjust the chemotherapy dose.   

## 2014-07-09 NOTE — Progress Notes (Signed)
Pt saw Dr. Alvy Bimler today prior to chemo.  Confirmed with Dr. Alvy Bimler -  OK to treat with lab results today.

## 2014-07-09 NOTE — Assessment & Plan Note (Signed)
This is likely due to recent treatment. The patient denies recent history of fevers, cough, chills, diarrhea or dysuria. He is asymptomatic from the leukopenia. I will observe for now.  I will continue the chemotherapy at current dose without dosage adjustment.  If the leukopenia gets progressive worse in the future, I might have to delay his treatment or adjust the chemotherapy dose. 

## 2014-07-09 NOTE — Patient Instructions (Signed)
[facility] Discharge Instructions for Patients Receiving Chemotherapy  Today you received the following chemotherapy agents :  Gemcitabine.  To help prevent nausea and vomiting after your treatment, we encourage you to take your nausea medication as prescribed by your physician.   If you develop nausea and vomiting that is not controlled by your nausea medication, call the clinic.   BELOW ARE SYMPTOMS THAT SHOULD BE REPORTED IMMEDIATELY:  *FEVER GREATER THAN 100.5 F  *CHILLS WITH OR WITHOUT FEVER  NAUSEA AND VOMITING THAT IS NOT CONTROLLED WITH YOUR NAUSEA MEDICATION  *UNUSUAL SHORTNESS OF BREATH  *UNUSUAL BRUISING OR BLEEDING  TENDERNESS IN MOUTH AND THROAT WITH OR WITHOUT PRESENCE OF ULCERS  *URINARY PROBLEMS  *BOWEL PROBLEMS  UNUSUAL RASH Items with * indicate a potential emergency and should be followed up as soon as possible.  Feel free to call the clinic you have any questions or concerns. The clinic phone number is [phone].    

## 2014-07-09 NOTE — Assessment & Plan Note (Signed)
He tolerates the chemotherapy well apart from mild pancytopenia and mild mucositis We will continue current treatment without dose adjustment.

## 2014-07-09 NOTE — Assessment & Plan Note (Signed)
The patient has been in remission for the past year. He will continue taking his chemotherapy through all this treatment.

## 2014-07-09 NOTE — Telephone Encounter (Signed)
gv and printed appt sched and avs for pt for Aug °

## 2014-07-10 ENCOUNTER — Ambulatory Visit
Admission: RE | Admit: 2014-07-10 | Discharge: 2014-07-10 | Disposition: A | Discharge status: Home / Self Care | Payer: Medicare Other | Source: Ambulatory Visit | Attending: Radiation Oncology | Admitting: Radiation Oncology

## 2014-07-10 DIAGNOSIS — Z510 Encounter for antineoplastic radiation therapy: Secondary | ICD-10-CM | POA: Diagnosis not present

## 2014-07-11 ENCOUNTER — Ambulatory Visit
Admission: RE | Admit: 2014-07-11 | Discharge: 2014-07-11 | Disposition: A | Discharge status: Home / Self Care | Payer: Medicare Other | Source: Ambulatory Visit | Attending: Radiation Oncology | Admitting: Radiation Oncology

## 2014-07-11 DIAGNOSIS — Z510 Encounter for antineoplastic radiation therapy: Secondary | ICD-10-CM | POA: Diagnosis not present

## 2014-07-12 ENCOUNTER — Ambulatory Visit
Admission: RE | Admit: 2014-07-12 | Discharge: 2014-07-12 | Disposition: A | Discharge status: Home / Self Care | Payer: Medicare Other | Source: Ambulatory Visit | Attending: Radiation Oncology | Admitting: Radiation Oncology

## 2014-07-12 DIAGNOSIS — Z510 Encounter for antineoplastic radiation therapy: Secondary | ICD-10-CM | POA: Diagnosis not present

## 2014-07-13 ENCOUNTER — Encounter: Payer: Self-pay | Admitting: Radiation Oncology

## 2014-07-13 ENCOUNTER — Ambulatory Visit
Admission: RE | Admit: 2014-07-13 | Discharge: 2014-07-13 | Disposition: A | Discharge status: Home / Self Care | Payer: Medicare Other | Source: Ambulatory Visit | Attending: Radiation Oncology | Admitting: Radiation Oncology

## 2014-07-13 VITALS — BP 125/73 | HR 73 | Temp 97.8°F | Resp 100 | Wt 232.9 lb

## 2014-07-13 DIAGNOSIS — Z510 Encounter for antineoplastic radiation therapy: Secondary | ICD-10-CM | POA: Diagnosis not present

## 2014-07-13 DIAGNOSIS — C250 Malignant neoplasm of head of pancreas: Secondary | ICD-10-CM

## 2014-07-13 NOTE — Progress Notes (Addendum)
Weekly rad txs pancreas 15 completd, nausea yesterday, felt like food was gettng stuck  At throat,was afraid to eat ,had jello last niht, resolved, consitpation now, not drinking water like before stated., fatigued .now

## 2014-07-13 NOTE — Progress Notes (Signed)
Department of Radiation Oncology  Phone:  [phone] Fax:        [phone]  Weekly Treatment Note    Name: Evan [name] Date: 07/13/2014 MRN: [id number] DOB: Mar 31, 1941   Current dose: 27 Gy  Current fraction: 15   MEDICATIONS: Current Outpatient Prescriptions  Medication Sig Dispense Refill  . acetaminophen (TYLENOL) 325 MG tablet Take 650 mg by mouth every 6 (six) hours as needed for moderate pain.      Marland Kitchen Alum & Mag Hydroxide-Simeth (MAGIC MOUTHWASH W/LIDOCAINE) SOLN Take 5 mLs by mouth 4 (four) times daily.  240 mL  3  . aspirin EC 81 MG tablet Take 81 mg by mouth daily.      . B Complex-C (SUPER B COMPLEX/VITAMIN C PO) Take 1 tablet by mouth daily.       . cholecalciferol (VITAMIN D) 1000 UNITS tablet Take 1,000 Units by mouth daily.      . dasatinib (SPRYCEL) 50 MG tablet Take 50 mg by mouth at bedtime.      . diphenhydrAMINE (BENADRYL) 25 mg capsule Take 1 capsule (25 mg total) by mouth every 6 (six) hours as needed for itching.  30 capsule  0  . hyaluronate sodium (RADIAPLEXRX) GEL Apply 1 application topically daily. Apply to affected treated area daily and prn after rad txs      . hydrocortisone cream 1 % Apply 1 application topically 2 (two) times daily as needed for itching (Rash).      Marland Kitchen HYDROmorphone (DILAUDID) 2 MG tablet Take 1 tablet (2 mg total) by mouth every 4 (four) hours as needed for severe pain.  30 tablet  0  . insulin glargine (LANTUS) 100 UNIT/ML injection Inject 42 Units into the skin at bedtime.       Marland Kitchen lactulose (CHRONULAC) 10 GM/15ML solution Take 15 mLs by mouth as needed. Hasn't used as yet,      . lidocaine-prilocaine (EMLA) cream Apply 1 application topically as needed.  30 g  6  . losartan (COZAAR) 100 MG tablet Take 100 mg by mouth every morning.       . metFORMIN (GLUCOPHAGE) 1000 MG tablet Take 1,000 mg by mouth 2 (two) times daily with a meal.      . ondansetron (ZOFRAN) 8 MG tablet Take 1 tablet (8 mg total) by mouth every 8  (eight) hours as needed for nausea.  20 tablet  2  . promethazine (PHENERGAN) 25 MG tablet Take 1 tablet (25 mg total) by mouth every 6 (six) hours as needed for nausea or vomiting.  60 tablet  3  . saxagliptin HCl (ONGLYZA) 5 MG TABS tablet Take 5 mg by mouth daily.      Marland Kitchen senna (SENOKOT) 8.6 MG tablet Take 2 tablets by mouth daily.       . simvastatin (ZOCOR) 20 MG tablet Take 20 mg by mouth every evening.      . Skin Protectants, Misc. (EUCERIN) cream Apply 1 application topically as needed for dry skin.      Marland Kitchen vitamin B-12 (CYANOCOBALAMIN) 1000 MCG tablet Take 1,000 mcg by mouth daily.       No current facility-administered medications for this encounter.     ALLERGIES: Ace inhibitors and Ciprofloxacin   LABORATORY DATA:  Lab Results  Component Value Date   WBC 2.6* 07/09/2014   HGB 12.2* 07/09/2014   HCT 35.4* 07/09/2014   MCV 95.6 07/09/2014   PLT 93* 07/09/2014   Lab Results  Component Value Date  NA 138 07/09/2014   K 4.0 07/09/2014   CL 99 06/25/2014   CO2 24 07/09/2014   Lab Results  Component Value Date   ALT 18 07/09/2014   AST 20 07/09/2014   ALKPHOS 107 07/09/2014   BILITOT 1.04 07/09/2014     NARRATIVE: Evan [name] was seen today for weekly treatment management. The chart was checked and the patient's films were reviewed. The patient is doing fairly well. He had some difficulty yesterday feeling that his food was sticking in his lower throat/upper chest. He denies any real complaints of nausea to me today. This resolved and he has no further difficulties today.  PHYSICAL EXAMINATION: weight is 232 lb 14.4 oz (105.643 kg). His oral temperature is 97.8 F (36.6 C). His blood pressure is 125/73 and his pulse is 73. His respiration is 100.        ASSESSMENT: The patient is doing satisfactorily with treatment.  PLAN: We will continue with the patient's radiation treatment as planned.

## 2014-07-16 ENCOUNTER — Ambulatory Visit: Payer: Medicare Other | Admitting: Nutrition

## 2014-07-16 ENCOUNTER — Ambulatory Visit
Admission: RE | Admit: 2014-07-16 | Discharge: 2014-07-16 | Disposition: A | Discharge status: Home / Self Care | Payer: Medicare Other | Source: Ambulatory Visit | Attending: Radiation Oncology | Admitting: Radiation Oncology

## 2014-07-16 ENCOUNTER — Other Ambulatory Visit (HOSPITAL_BASED_OUTPATIENT_CLINIC_OR_DEPARTMENT_OTHER): Payer: Medicare Other

## 2014-07-16 ENCOUNTER — Other Ambulatory Visit: Payer: Medicare Other

## 2014-07-16 ENCOUNTER — Ambulatory Visit: Payer: Medicare Other

## 2014-07-16 ENCOUNTER — Ambulatory Visit (HOSPITAL_BASED_OUTPATIENT_CLINIC_OR_DEPARTMENT_OTHER): Payer: Medicare Other | Admitting: Hematology and Oncology

## 2014-07-16 ENCOUNTER — Ambulatory Visit (HOSPITAL_BASED_OUTPATIENT_CLINIC_OR_DEPARTMENT_OTHER): Payer: Medicare Other

## 2014-07-16 ENCOUNTER — Telehealth: Payer: Self-pay | Admitting: Hematology and Oncology

## 2014-07-16 ENCOUNTER — Encounter: Payer: Self-pay | Admitting: Hematology and Oncology

## 2014-07-16 VITALS — BP 124/67 | HR 108 | Temp 98.4°F | Resp 18 | Ht 74.0 in | Wt 235.5 lb

## 2014-07-16 DIAGNOSIS — C9211 Chronic myeloid leukemia, BCR/ABL-positive, in remission: Secondary | ICD-10-CM

## 2014-07-16 DIAGNOSIS — D630 Anemia in neoplastic disease: Secondary | ICD-10-CM

## 2014-07-16 DIAGNOSIS — K5909 Other constipation: Secondary | ICD-10-CM

## 2014-07-16 DIAGNOSIS — D701 Agranulocytosis secondary to cancer chemotherapy: Secondary | ICD-10-CM

## 2014-07-16 DIAGNOSIS — T50905A Adverse effect of unspecified drugs, medicaments and biological substances, initial encounter: Secondary | ICD-10-CM

## 2014-07-16 DIAGNOSIS — K5900 Constipation, unspecified: Secondary | ICD-10-CM | POA: Insufficient documentation

## 2014-07-16 DIAGNOSIS — R1011 Right upper quadrant pain: Secondary | ICD-10-CM

## 2014-07-16 DIAGNOSIS — C250 Malignant neoplasm of head of pancreas: Secondary | ICD-10-CM

## 2014-07-16 DIAGNOSIS — Z95828 Presence of other vascular implants and grafts: Secondary | ICD-10-CM

## 2014-07-16 DIAGNOSIS — Z510 Encounter for antineoplastic radiation therapy: Secondary | ICD-10-CM | POA: Diagnosis not present

## 2014-07-16 DIAGNOSIS — D6959 Other secondary thrombocytopenia: Secondary | ICD-10-CM

## 2014-07-16 DIAGNOSIS — Z5111 Encounter for antineoplastic chemotherapy: Secondary | ICD-10-CM

## 2014-07-16 DIAGNOSIS — T451X5A Adverse effect of antineoplastic and immunosuppressive drugs, initial encounter: Secondary | ICD-10-CM

## 2014-07-16 HISTORY — DX: Constipation, unspecified: K59.00

## 2014-07-16 LAB — COMPREHENSIVE METABOLIC PANEL (CC13)
ALT: 23 U/L (ref 0–55)
ANION GAP: 11 meq/L (ref 3–11)
AST: 23 U/L (ref 5–34)
Albumin: 3.6 g/dL (ref 3.5–5.0)
Alkaline Phosphatase: 109 U/L (ref 40–150)
BUN: 12 mg/dL (ref 7.0–26.0)
CO2: 24 mEq/L (ref 22–29)
CREATININE: 1.2 mg/dL (ref 0.7–1.3)
Calcium: 10.1 mg/dL (ref 8.4–10.4)
Chloride: 104 mEq/L (ref 98–109)
Glucose: 148 mg/dl — ABNORMAL HIGH (ref 70–140)
Potassium: 4 mEq/L (ref 3.5–5.1)
Sodium: 139 mEq/L (ref 136–145)
Total Bilirubin: 0.97 mg/dL (ref 0.20–1.20)
Total Protein: 6.8 g/dL (ref 6.4–8.3)

## 2014-07-16 LAB — CBC WITH DIFFERENTIAL/PLATELET
BASO%: 0.5 % (ref 0.0–2.0)
BASOS ABS: 0 10*3/uL (ref 0.0–0.1)
EOS%: 0.3 % (ref 0.0–7.0)
Eosinophils Absolute: 0 10*3/uL (ref 0.0–0.5)
HCT: 32.4 % — ABNORMAL LOW (ref 38.4–49.9)
HEMOGLOBIN: 11.3 g/dL — AB (ref 13.0–17.1)
LYMPH#: 0.7 10*3/uL — AB (ref 0.9–3.3)
LYMPH%: 18.8 % (ref 14.0–49.0)
MCH: 33 pg (ref 27.2–33.4)
MCHC: 34.9 g/dL (ref 32.0–36.0)
MCV: 94.7 fL (ref 79.3–98.0)
MONO#: 0.5 10*3/uL (ref 0.1–0.9)
MONO%: 13.3 % (ref 0.0–14.0)
NEUT#: 2.5 10*3/uL (ref 1.5–6.5)
NEUT%: 67.1 % (ref 39.0–75.0)
Platelets: 125 10*3/uL — ABNORMAL LOW (ref 140–400)
RBC: 3.42 10*6/uL — AB (ref 4.20–5.82)
RDW: 14.5 % (ref 11.0–14.6)
WBC: 3.7 10*3/uL — ABNORMAL LOW (ref 4.0–10.3)

## 2014-07-16 LAB — TECHNOLOGIST REVIEW

## 2014-07-16 MED ORDER — SODIUM CHLORIDE 0.9 % IJ SOLN
10.0000 mL | INTRAMUSCULAR | Status: DC | PRN
  Administered 2014-07-16: 10 mL
  Filled 2014-07-16: qty 10

## 2014-07-16 MED ORDER — SODIUM CHLORIDE 0.9 % IV SOLN
800.0000 mg/m2 | Freq: Once | INTRAVENOUS | Status: AC
  Administered 2014-07-16: 1900 mg via INTRAVENOUS
  Filled 2014-07-16: qty 49.97

## 2014-07-16 MED ORDER — PROCHLORPERAZINE MALEATE 10 MG PO TABS
10.0000 mg | ORAL_TABLET | Freq: Once | ORAL | Status: AC
  Administered 2014-07-16: 10 mg via ORAL

## 2014-07-16 MED ORDER — HEPARIN SOD (PORK) LOCK FLUSH 100 UNIT/ML IV SOLN
500.0000 [IU] | Freq: Once | INTRAVENOUS | Status: AC | PRN
  Administered 2014-07-16: 500 [IU]
  Filled 2014-07-16: qty 5

## 2014-07-16 MED ORDER — HYDROMORPHONE HCL 4 MG PO TABS
4.0000 mg | ORAL_TABLET | ORAL | Status: DC | PRN
Start: 2014-07-16 — End: 2014-08-07

## 2014-07-16 MED ORDER — PROCHLORPERAZINE MALEATE 10 MG PO TABS
ORAL_TABLET | ORAL | Status: AC
  Filled 2014-07-16: qty 1

## 2014-07-16 MED ORDER — SODIUM CHLORIDE 0.9 % IV SOLN
Freq: Once | INTRAVENOUS | Status: AC
  Administered 2014-07-16: 11:00:00 via INTRAVENOUS

## 2014-07-16 MED ORDER — SODIUM CHLORIDE 0.9 % IJ SOLN
10.0000 mL | INTRAMUSCULAR | Status: DC | PRN
  Administered 2014-07-16: 10 mL via INTRAVENOUS
  Filled 2014-07-16: qty 10

## 2014-07-16 NOTE — Progress Notes (Signed)
[facility] PROGRESS NOTE  Patient Care Team: [name], MD as PCP - General (Internal Medicine) [name], MD as Referring Physician (Specialist) Provider Not In System [name], MD as Consulting Physician (Hematology and Oncology)  SUMMARY OF ONCOLOGIC HISTORY: Oncology History   Pancreatic cancer   Primary site: Pancreas   Staging method: AJCC 7th Edition   Clinical: Stage IIA (T3, N0, M0) signed by [name], MD on 06/13/2014  4:37 PM   Summary: Stage IIA (T3, N0, M0)       Pancreatic cancer   06/05/2014 Imaging  MRI abdomen showed 2.2 cm lesion in the pancreatic head obstructing the common bile duct and the pancreatic duct is highly concerning for pancreatic adenocarcinoma.  There is moderate intra and extrahepatic biliary duct dilatation.    06/05/2014 Tumor Marker CA 19-9 is elevated at 1234   06/06/2014 Imaging Ct scan of chest showed 2 lung nodules, likely benign   06/07/2014 Procedure He had ERCP and stent placement   06/07/2014 Procedure He had endoscopic ultrasound with  fine needle aspiration.   06/07/2014 Pathology Results Accession: [id number] FNA is positive for pancreatic adenoca   06/14/2014 Surgery He has port placement   06/25/2014 -  Chemotherapy He received neoadjuvant chemotherapy with Gemzar   06/25/2014 -  Radiation Therapy he received neoadjuvant radiation    INTERVAL HISTORY: Please see below for problem oriented charting. He is seen prior to cycle 4 treatment. His only main complaint today is right upper quadrant pain that comes and goes. He is rating his pain is 8/10 pain. The pain is well-controlled with current pain medication. It does not radiate anywhere else.  REVIEW OF SYSTEMS:   Constitutional: Denies fevers, chills or abnormal weight loss Eyes: Denies blurriness of vision Ears, nose, mouth, throat, and face: Denies mucositis or sore throat Respiratory: Denies cough, dyspnea or wheezes Cardiovascular: Denies  palpitation, chest discomfort or lower extremity swelling Gastrointestinal:  Denies nausea, heartburn or change in bowel habits Skin: Denies abnormal skin rashes Lymphatics: Denies new lymphadenopathy or easy bruising Neurological:Denies numbness, tingling or new weaknesses Behavioral/Psych: Mood is stable, no new changes  All other systems were reviewed with the patient and are negative.  I have reviewed the past medical history, past surgical history, social history and family history with the patient and they are unchanged from previous note.  ALLERGIES:  is allergic to ace inhibitors and ciprofloxacin.  MEDICATIONS:  Current Outpatient Prescriptions  Medication Sig Dispense Refill  . acetaminophen (TYLENOL) 325 MG tablet Take 650 mg by mouth every 6 (six) hours as needed for moderate pain.      Marland Kitchen Alum & Mag Hydroxide-Simeth (MAGIC MOUTHWASH W/LIDOCAINE) SOLN Take 5 mLs by mouth 4 (four) times daily.  240 mL  3  . aspirin EC 81 MG tablet Take 81 mg by mouth daily.      . B Complex-C (SUPER B COMPLEX/VITAMIN C PO) Take 1 tablet by mouth daily.       . cholecalciferol (VITAMIN D) 1000 UNITS tablet Take 1,000 Units by mouth daily.      . dasatinib (SPRYCEL) 50 MG tablet Take 50 mg by mouth at bedtime.      . diphenhydrAMINE (BENADRYL) 25 mg capsule Take 1 capsule (25 mg total) by mouth every 6 (six) hours as needed for itching.  30 capsule  0  . hyaluronate sodium (RADIAPLEXRX) GEL Apply 1 application topically daily. Apply to affected treated area daily and prn after rad txs      .  hydrocortisone cream 1 % Apply 1 application topically 2 (two) times daily as needed for itching (Rash).      Marland Kitchen HYDROmorphone (DILAUDID) 4 MG tablet Take 1 tablet (4 mg total) by mouth every 4 (four) hours as needed for severe pain.  90 tablet  0  . insulin glargine (LANTUS) 100 UNIT/ML injection Inject 42 Units into the skin at bedtime.       Marland Kitchen lactulose (CHRONULAC) 10 GM/15ML solution Take 15 mLs by mouth as  needed. Hasn't used as yet,      . lidocaine-prilocaine (EMLA) cream Apply 1 application topically as needed.  30 g  6  . losartan (COZAAR) 100 MG tablet Take 100 mg by mouth every morning.       . metFORMIN (GLUCOPHAGE) 1000 MG tablet Take 1,000 mg by mouth 2 (two) times daily with a meal.      . ondansetron (ZOFRAN) 8 MG tablet Take 1 tablet (8 mg total) by mouth every 8 (eight) hours as needed for nausea.  20 tablet  2  . promethazine (PHENERGAN) 25 MG tablet Take 1 tablet (25 mg total) by mouth every 6 (six) hours as needed for nausea or vomiting.  60 tablet  3  . saxagliptin HCl (ONGLYZA) 5 MG TABS tablet Take 5 mg by mouth daily.      Marland Kitchen senna (SENOKOT) 8.6 MG tablet Take 2 tablets by mouth daily.       . simvastatin (ZOCOR) 20 MG tablet Take 20 mg by mouth every evening.      . Skin Protectants, Misc. (EUCERIN) cream Apply 1 application topically as needed for dry skin.      Marland Kitchen vitamin B-12 (CYANOCOBALAMIN) 1000 MCG tablet Take 1,000 mcg by mouth daily.       No current facility-administered medications for this visit.   Facility-Administered Medications Ordered in Other Visits  Medication Dose Route Frequency Provider Last Rate Last Dose  . sodium chloride 0.9 % injection 10 mL  10 mL Intracatheter PRN [name], MD   10 mL at 07/16/14 1150    PHYSICAL EXAMINATION: ECOG PERFORMANCE STATUS: 1 - Symptomatic but completely ambulatory  Filed Vitals:   07/16/14 0958  BP: 124/67  Pulse: 108  Temp: 98.4 F (36.9 C)  Resp: 18   Filed Weights   07/16/14 0958  Weight: 235 lb 8 oz (106.822 kg)    GENERAL:alert, no distress and comfortable. His moderately obese SKIN: skin color, texture, turgor are normal, no rashes or significant lesions EYES: normal, Conjunctiva are pink and non-injected, sclera clear OROPHARYNX:no exudate, no erythema and lips, buccal mucosa, and tongue normal  NECK: supple, thyroid normal size, non-tender, without nodularity LYMPH:  no palpable lymphadenopathy  in the cervical, axillary or inguinal LUNGS: clear to auscultation and percussion with normal breathing effort HEART: regular rate & rhythm and no murmurs and no lower extremity edema ABDOMEN:abdomen soft, marked tenderness some palpation in the right upper quadrant region with no rebound or guarding. Musculoskeletal:no cyanosis of digits and no clubbing  NEURO: alert & oriented x 3 with fluent speech, no focal motor/sensory deficits  LABORATORY DATA:  I have reviewed the data as listed    Component Value Date/Time   NA 139 07/16/2014 0942   NA 137 06/25/2014 1100   K 4.0 07/16/2014 0942   K 4.8 06/25/2014 1100   CL 99 06/25/2014 1100   CL 107 04/27/2013 0926   CO2 24 07/16/2014 0942   CO2 25 06/25/2014 1100   GLUCOSE 148*  07/16/2014 0942   GLUCOSE 291* 06/25/2014 1100   GLUCOSE 212* 04/27/2013 0926   BUN 12.0 07/16/2014 0942   BUN 17 06/25/2014 1100   CREATININE 1.2 07/16/2014 0942   CREATININE 1.27 06/25/2014 1100   CALCIUM 10.1 07/16/2014 0942   CALCIUM 10.8* 06/25/2014 1100   PROT 6.8 07/16/2014 0942   PROT 7.2 06/25/2014 1100   ALBUMIN 3.6 07/16/2014 0942   ALBUMIN 3.7 06/25/2014 1100   AST 23 07/16/2014 0942   AST 22 06/25/2014 1100   ALT 23 07/16/2014 0942   ALT 37 06/25/2014 1100   ALKPHOS 109 07/16/2014 0942   ALKPHOS 173* 06/25/2014 1100   BILITOT 0.97 07/16/2014 0942   BILITOT 0.8 06/25/2014 1100   GFRNONAA 68* 06/08/2014 0437   GFRAA 79* 06/08/2014 0437    No results found for this basename: SPEP, UPEP,  kappa and lambda light chains    Lab Results  Component Value Date   WBC 3.7* 07/16/2014   NEUTROABS 2.5 07/16/2014   HGB 11.3* 07/16/2014   HCT 32.4* 07/16/2014   MCV 94.7 07/16/2014   PLT 125* 07/16/2014      Chemistry      Component Value Date/Time   NA 139 07/16/2014 0942   NA 137 06/25/2014 1100   K 4.0 07/16/2014 0942   K 4.8 06/25/2014 1100   CL 99 06/25/2014 1100   CL 107 04/27/2013 0926   CO2 24 07/16/2014 0942   CO2 25 06/25/2014 1100   BUN 12.0 07/16/2014 0942   BUN 17  06/25/2014 1100   CREATININE 1.2 07/16/2014 0942   CREATININE 1.27 06/25/2014 1100      Component Value Date/Time   CALCIUM 10.1 07/16/2014 0942   CALCIUM 10.8* 06/25/2014 1100   ALKPHOS 109 07/16/2014 0942   ALKPHOS 173* 06/25/2014 1100   AST 23 07/16/2014 0942   AST 22 06/25/2014 1100   ALT 23 07/16/2014 0942   ALT 37 06/25/2014 1100   BILITOT 0.97 07/16/2014 0942   BILITOT 0.8 06/25/2014 1100      ASSESSMENT & PLAN:  Malignant neoplasm of head of pancreas He tolerates the chemotherapy well apart from mild pancytopenia and mild mucositis We will continue current treatment without dose adjustment.      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 He will continue taking his chemotherapy through all this treatment.         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 The patient denies recent history of bleeding such as epistaxis, hematuria or hematochezia. [name] I will observe for now.  He does not require transfusion now. I will continue the chemotherapy at current dose without dosage adjustment.  If the anemia gets progressive worse in the future, I might have to delay his treatment or adjust the chemotherapy dose.     Leukopenia due to antineoplastic chemotherapy This is likely due to recent treatment. The patient denies recent history of fevers, cough, chills, diarrhea or dysuria. He is asymptomatic from the leukopenia. I will observe for now.  I will continue the chemotherapy at current dose without dosage adjustment.  If the leukopenia gets progressive worse in the future, I might have to delay his treatment or adjust the chemotherapy dose.        Thrombocytopenia due to drugs This is likely due to recent treatment. The patient denies recent history of bleeding such as epistaxis, hematuria or hematochezia. He is asymptomatic from the  low platelet count. I will observe for now.  he does not  require transfusion now. I will continue the chemotherapy at current dose without dosage adjustment.  If the thrombocytopenia gets progressive worse in the future, I might have to delay his treatment or adjust the chemotherapy dose.        Right upper quadrant abdominal pain This is likely related to pain from cancer site or spasm involving his stents. I recommend pain medication as needed and refill his pain prescription. I warned him about side effects including risk of constipation.   No orders of the defined types were placed in this encounter.   All questions were answered. The patient knows to call the clinic with any problems, questions or concerns. No barriers to learning was detected. I spent 30 minutes counseling the patient face to face. The total time spent in the appointment was 40 minutes and more than 50% was on counseling and review of test results     [facility], Jayvier Burgher, MD 07/16/2014 4:36 PM

## 2014-07-16 NOTE — Progress Notes (Signed)
Followup completed with patient and wife.  Weight stable and documented as 235.5 pounds August 31, from 234.9 pounds.  Patient continues to be "picky" about what he does eat.  He denies nutrition side effects.  Nutrition diagnosis: Food and nutrition related knowledge deficit improved.  Intervention: Educated patient on the importance of continuing smaller, more frequent meals and snacks.   Reviewed high protein foods.   Provided samples of oral nutrition supplements.  Patient willing to try. Questions answered.  Teach back method used.  Monitoring, evaluation, goals: Patient is tolerating adequate calories and has had weight stability.  Next visit: Wednesday, September 9, during chemotherapy.  **Disclaimer: This note was dictated with voice recognition software. Similar sounding words can inadvertently be transcribed and this note may contain transcription errors which may not have been corrected upon publication of note.**

## 2014-07-16 NOTE — Assessment & Plan Note (Signed)
This is likely related to pain from cancer site or spasm involving his stents. I recommend pain medication as needed and refill his pain prescription. I warned him about side effects including risk of constipation.

## 2014-07-16 NOTE — Patient Instructions (Signed)
[facility] Discharge Instructions for Patients Receiving Chemotherapy  Today you received the following chemotherapy agents Gemzar.  To help prevent nausea and vomiting after your treatment, we encourage you to take your nausea medication.   If you develop nausea and vomiting that is not controlled by your nausea medication, call the clinic.   BELOW ARE SYMPTOMS THAT SHOULD BE REPORTED IMMEDIATELY:  *FEVER GREATER THAN 100.5 F  *CHILLS WITH OR WITHOUT FEVER  NAUSEA AND VOMITING THAT IS NOT CONTROLLED WITH YOUR NAUSEA MEDICATION  *UNUSUAL SHORTNESS OF BREATH  *UNUSUAL BRUISING OR BLEEDING  TENDERNESS IN MOUTH AND THROAT WITH OR WITHOUT PRESENCE OF ULCERS  *URINARY PROBLEMS  *BOWEL PROBLEMS  UNUSUAL RASH Items with * indicate a potential emergency and should be followed up as soon as possible.  Feel free to call the clinic you have any questions or concerns. The clinic phone number is [phone].    

## 2014-07-16 NOTE — Telephone Encounter (Signed)
gv and printed appt sched and avs for pt for Sept.....sed added tx °

## 2014-07-16 NOTE — Assessment & Plan Note (Signed)
The patient has been in remission for the past year. He will continue taking his chemotherapy through all this treatment.

## 2014-07-16 NOTE — Assessment & Plan Note (Signed)
This is likely due to recent treatment. The patient denies recent history of fevers, cough, chills, diarrhea or dysuria. He is asymptomatic from the leukopenia. I will observe for now.  I will continue the chemotherapy at current dose without dosage adjustment.  If the leukopenia gets progressive worse in the future, I might have to delay his treatment or adjust the chemotherapy dose. 

## 2014-07-16 NOTE — Patient Instructions (Signed)

## 2014-07-16 NOTE — Assessment & Plan Note (Signed)
This is likely due to recent treatment. The patient denies recent history of bleeding such as epistaxis, hematuria or hematochezia. He is asymptomatic from the low platelet count. I will observe for now.  he does not require transfusion now. I will continue the chemotherapy at current dose without dosage adjustment.  If the thrombocytopenia gets progressive worse in the future, I might have to delay his treatment or adjust the chemotherapy dose.   

## 2014-07-16 NOTE — Assessment & Plan Note (Signed)
This is likely due to recent treatment. The patient denies recent history of bleeding such as epistaxis, hematuria or hematochezia. [name] I will observe for now.  He does not require transfusion now. I will continue the chemotherapy at current dose without dosage adjustment.  If the anemia gets progressive worse in the future, I might have to delay his treatment or adjust the chemotherapy dose.  

## 2014-07-16 NOTE — Assessment & Plan Note (Signed)
He tolerates the chemotherapy well apart from mild pancytopenia and mild mucositis We will continue current treatment without dose adjustment.

## 2014-07-17 ENCOUNTER — Other Ambulatory Visit: Payer: Self-pay | Admitting: *Deleted

## 2014-07-17 ENCOUNTER — Ambulatory Visit
Admission: RE | Admit: 2014-07-17 | Discharge: 2014-07-17 | Disposition: A | Discharge status: Home / Self Care | Payer: Medicare Other | Source: Ambulatory Visit | Attending: Radiation Oncology | Admitting: Radiation Oncology

## 2014-07-17 DIAGNOSIS — Z510 Encounter for antineoplastic radiation therapy: Secondary | ICD-10-CM | POA: Diagnosis not present

## 2014-07-17 DIAGNOSIS — C9210 Chronic myeloid leukemia, BCR/ABL-positive, not having achieved remission: Secondary | ICD-10-CM

## 2014-07-17 MED ORDER — ONDANSETRON HCL 8 MG PO TABS: 8.0000 mg | ORAL_TABLET | Freq: Three times a day (TID) | ORAL | Status: DC | PRN

## 2014-07-18 ENCOUNTER — Ambulatory Visit
Admission: RE | Admit: 2014-07-18 | Discharge: 2014-07-18 | Disposition: A | Discharge status: Home / Self Care | Payer: Medicare Other | Source: Ambulatory Visit | Attending: Radiation Oncology | Admitting: Radiation Oncology

## 2014-07-18 ENCOUNTER — Ambulatory Visit: Payer: Medicare Other | Admitting: Hematology and Oncology

## 2014-07-18 DIAGNOSIS — Z510 Encounter for antineoplastic radiation therapy: Secondary | ICD-10-CM | POA: Diagnosis not present

## 2014-07-19 ENCOUNTER — Ambulatory Visit
Admission: RE | Admit: 2014-07-19 | Discharge: 2014-07-19 | Disposition: A | Discharge status: Home / Self Care | Payer: Medicare Other | Source: Ambulatory Visit | Attending: Radiation Oncology | Admitting: Radiation Oncology

## 2014-07-19 DIAGNOSIS — Z510 Encounter for antineoplastic radiation therapy: Secondary | ICD-10-CM | POA: Diagnosis not present

## 2014-07-20 ENCOUNTER — Ambulatory Visit
Admission: RE | Admit: 2014-07-20 | Discharge: 2014-07-20 | Disposition: A | Discharge status: Home / Self Care | Payer: Medicare Other | Source: Ambulatory Visit | Attending: Radiation Oncology | Admitting: Radiation Oncology

## 2014-07-20 ENCOUNTER — Encounter: Payer: Self-pay | Admitting: Radiation Oncology

## 2014-07-20 DIAGNOSIS — Z510 Encounter for antineoplastic radiation therapy: Secondary | ICD-10-CM | POA: Diagnosis not present

## 2014-07-20 NOTE — Progress Notes (Signed)
Department of Radiation Oncology  Phone:  [phone] Fax:        [phone]  Weekly Treatment Note    Name: Evan [name] Date: 07/20/2014 MRN: [id number] DOB: 1941-03-03   Current dose: 36 Gy  Current fraction: 20   MEDICATIONS: Current Outpatient Prescriptions  Medication Sig Dispense Refill  . acetaminophen (TYLENOL) 325 MG tablet Take 650 mg by mouth every 6 (six) hours as needed for moderate pain.      Marland Kitchen Alum & Mag Hydroxide-Simeth (MAGIC MOUTHWASH W/LIDOCAINE) SOLN Take 5 mLs by mouth 4 (four) times daily.  240 mL  3  . aspirin EC 81 MG tablet Take 81 mg by mouth daily.      . B Complex-C (SUPER B COMPLEX/VITAMIN C PO) Take 1 tablet by mouth daily.       . cholecalciferol (VITAMIN D) 1000 UNITS tablet Take 1,000 Units by mouth daily.      . dasatinib (SPRYCEL) 50 MG tablet Take 50 mg by mouth at bedtime.      . diphenhydrAMINE (BENADRYL) 25 mg capsule Take 1 capsule (25 mg total) by mouth every 6 (six) hours as needed for itching.  30 capsule  0  . hyaluronate sodium (RADIAPLEXRX) GEL Apply 1 application topically daily. Apply to affected treated area daily and prn after rad txs      . hydrocortisone cream 1 % Apply 1 application topically 2 (two) times daily as needed for itching (Rash).      . insulin glargine (LANTUS) 100 UNIT/ML injection Inject 42 Units into the skin at bedtime.       Marland Kitchen lactulose (CHRONULAC) 10 GM/15ML solution Take 15 mLs by mouth as needed. Hasn't used as yet,      . lidocaine-prilocaine (EMLA) cream Apply 1 application topically as needed.  30 g  6  . losartan (COZAAR) 100 MG tablet Take 100 mg by mouth every morning.       . metFORMIN (GLUCOPHAGE) 1000 MG tablet Take 1,000 mg by mouth 2 (two) times daily with a meal.      . promethazine (PHENERGAN) 25 MG tablet Take 1 tablet (25 mg total) by mouth every 6 (six) hours as needed for nausea or vomiting.  60 tablet  3  . saxagliptin HCl (ONGLYZA) 5 MG TABS tablet Take 5 mg by mouth daily.        Marland Kitchen senna (SENOKOT) 8.6 MG tablet Take 2 tablets by mouth daily.       . simvastatin (ZOCOR) 20 MG tablet Take 20 mg by mouth every evening.      . Skin Protectants, Misc. (EUCERIN) cream Apply 1 application topically as needed for dry skin.      Marland Kitchen vitamin B-12 (CYANOCOBALAMIN) 1000 MCG tablet Take 1,000 mcg by mouth daily.      Marland Kitchen HYDROmorphone (DILAUDID) 4 MG tablet Take 1 tablet (4 mg total) by mouth every 4 (four) hours as needed for severe pain.  90 tablet  0  . ondansetron (ZOFRAN) 8 MG tablet Take 1 tablet (8 mg total) by mouth every 8 (eight) hours as needed for nausea.  20 tablet  2   No current facility-administered medications for this encounter.     ALLERGIES: Ace inhibitors and Ciprofloxacin   LABORATORY DATA:  Lab Results  Component Value Date   WBC 3.7* 07/16/2014   HGB 11.3* 07/16/2014   HCT 32.4* 07/16/2014   MCV 94.7 07/16/2014   PLT 125* 07/16/2014   Lab Results  Component Value Date  NA 139 07/16/2014   K 4.0 07/16/2014   CL 99 06/25/2014   CO2 24 07/16/2014   Lab Results  Component Value Date   ALT 23 07/16/2014   AST 23 07/16/2014   ALKPHOS 109 07/16/2014   BILITOT 0.97 07/16/2014     NARRATIVE: [name] was seen today for weekly treatment management. The chart was checked and the patient's films were reviewed. The patient is doing very well. He continues to do so. Good appetite without nausea. No significant difficulties with diarrhea.  PHYSICAL EXAMINATION:   Alert, in no acute distress     ASSESSMENT: The patient is doing satisfactorily with treatment.  PLAN: We will continue with the patient's radiation treatment as planned.

## 2014-07-20 NOTE — Addendum Note (Signed)
Encounter addended by: Marye Round, MD on: 07/20/2014 11:17 AM<BR>     Documentation filed: Charges VN, Notes Section

## 2014-07-20 NOTE — Progress Notes (Signed)
Weekly rad txs abdomen, 20/28 completed, no nausea, had good bowel movement this morning, appetite up and down, hungry today, no c/o pain,  Fatigued still,  9:27 AM

## 2014-07-24 ENCOUNTER — Ambulatory Visit
Admission: RE | Admit: 2014-07-24 | Discharge: 2014-07-24 | Disposition: A | Discharge status: Home / Self Care | Payer: Medicare Other | Source: Ambulatory Visit | Attending: Radiation Oncology | Admitting: Radiation Oncology

## 2014-07-24 ENCOUNTER — Ambulatory Visit (HOSPITAL_BASED_OUTPATIENT_CLINIC_OR_DEPARTMENT_OTHER): Payer: Medicare Other | Admitting: Hematology and Oncology

## 2014-07-24 ENCOUNTER — Ambulatory Visit (HOSPITAL_BASED_OUTPATIENT_CLINIC_OR_DEPARTMENT_OTHER): Payer: Medicare Other | Admitting: *Deleted

## 2014-07-24 ENCOUNTER — Encounter: Payer: Self-pay | Admitting: Hematology and Oncology

## 2014-07-24 ENCOUNTER — Telehealth: Payer: Self-pay | Admitting: *Deleted

## 2014-07-24 ENCOUNTER — Telehealth: Payer: Self-pay | Admitting: Hematology and Oncology

## 2014-07-24 VITALS — BP 139/59 | HR 98 | Temp 98.5°F | Resp 18 | Ht 74.0 in | Wt 230.5 lb

## 2014-07-24 DIAGNOSIS — D630 Anemia in neoplastic disease: Secondary | ICD-10-CM

## 2014-07-24 DIAGNOSIS — C9211 Chronic myeloid leukemia, BCR/ABL-positive, in remission: Secondary | ICD-10-CM

## 2014-07-24 DIAGNOSIS — N183 Chronic kidney disease, stage 3 unspecified: Secondary | ICD-10-CM

## 2014-07-24 DIAGNOSIS — C250 Malignant neoplasm of head of pancreas: Secondary | ICD-10-CM

## 2014-07-24 DIAGNOSIS — D6959 Other secondary thrombocytopenia: Secondary | ICD-10-CM

## 2014-07-24 DIAGNOSIS — E1129 Type 2 diabetes mellitus with other diabetic kidney complication: Secondary | ICD-10-CM

## 2014-07-24 DIAGNOSIS — C251 Malignant neoplasm of body of pancreas: Secondary | ICD-10-CM

## 2014-07-24 DIAGNOSIS — R1011 Right upper quadrant pain: Secondary | ICD-10-CM

## 2014-07-24 DIAGNOSIS — R110 Nausea: Secondary | ICD-10-CM

## 2014-07-24 DIAGNOSIS — R509 Fever, unspecified: Secondary | ICD-10-CM

## 2014-07-24 DIAGNOSIS — E1122 Type 2 diabetes mellitus with diabetic chronic kidney disease: Secondary | ICD-10-CM

## 2014-07-24 DIAGNOSIS — K5909 Other constipation: Secondary | ICD-10-CM

## 2014-07-24 DIAGNOSIS — Z510 Encounter for antineoplastic radiation therapy: Secondary | ICD-10-CM | POA: Diagnosis not present

## 2014-07-24 DIAGNOSIS — T50905A Adverse effect of unspecified drugs, medicaments and biological substances, initial encounter: Secondary | ICD-10-CM

## 2014-07-24 LAB — COMPREHENSIVE METABOLIC PANEL (CC13)
ALBUMIN: 3.2 g/dL — AB (ref 3.5–5.0)
ALT: 21 U/L (ref 0–55)
AST: 28 U/L (ref 5–34)
Alkaline Phosphatase: 144 U/L (ref 40–150)
Anion Gap: 10 mEq/L (ref 3–11)
BUN: 19.8 mg/dL (ref 7.0–26.0)
CHLORIDE: 103 meq/L (ref 98–109)
CO2: 26 mEq/L (ref 22–29)
Calcium: 9.5 mg/dL (ref 8.4–10.4)
Creatinine: 1.6 mg/dL — ABNORMAL HIGH (ref 0.7–1.3)
Glucose: 116 mg/dl (ref 70–140)
Potassium: 4 mEq/L (ref 3.5–5.1)
Sodium: 139 mEq/L (ref 136–145)
Total Bilirubin: 0.75 mg/dL (ref 0.20–1.20)
Total Protein: 6.7 g/dL (ref 6.4–8.3)

## 2014-07-24 LAB — CBC WITH DIFFERENTIAL/PLATELET
BASO%: 0 % (ref 0.0–2.0)
Basophils Absolute: 0 10*3/uL (ref 0.0–0.1)
EOS%: 0.2 % (ref 0.0–7.0)
Eosinophils Absolute: 0 10*3/uL (ref 0.0–0.5)
HCT: 30.3 % — ABNORMAL LOW (ref 38.4–49.9)
HGB: 10.5 g/dL — ABNORMAL LOW (ref 13.0–17.1)
LYMPH#: 0.5 10*3/uL — AB (ref 0.9–3.3)
LYMPH%: 10.9 % — ABNORMAL LOW (ref 14.0–49.0)
MCH: 33 pg (ref 27.2–33.4)
MCHC: 34.7 g/dL (ref 32.0–36.0)
MCV: 95.3 fL (ref 79.3–98.0)
MONO#: 0.8 10*3/uL (ref 0.1–0.9)
MONO%: 16 % — ABNORMAL HIGH (ref 0.0–14.0)
NEUT#: 3.6 10*3/uL (ref 1.5–6.5)
NEUT%: 72.9 % (ref 39.0–75.0)
Platelets: 110 10*3/uL — ABNORMAL LOW (ref 140–400)
RBC: 3.18 10*6/uL — ABNORMAL LOW (ref 4.20–5.82)
RDW: 16.7 % — AB (ref 11.0–14.6)
WBC: 4.9 10*3/uL (ref 4.0–10.3)

## 2014-07-24 LAB — TECHNOLOGIST REVIEW: Technologist Review: 2

## 2014-07-24 MED ORDER — AMOXICILLIN-POT CLAVULANATE 875-125 MG PO TABS: 1.0000 | ORAL_TABLET | Freq: Two times a day (BID) | ORAL | Status: DC

## 2014-07-24 MED ORDER — MORPHINE SULFATE ER 30 MG PO TBCR: 30.0000 mg | EXTENDED_RELEASE_TABLET | Freq: Two times a day (BID) | ORAL | Status: DC

## 2014-07-24 NOTE — Assessment & Plan Note (Signed)
He is starting to experience more side effects from treatment. I will proceed with treatment tomorrow without dose adjustment. His next week chemotherapy would be adjusted to reflect a weekly treatment.

## 2014-07-24 NOTE — Assessment & Plan Note (Signed)
This is likely due to recent treatment. The patient denies recent history of bleeding such as epistaxis, hematuria or hematochezia. [name] I will observe for now.  He does not require transfusion now. I will continue the chemotherapy at current dose without dosage adjustment.  If the anemia gets progressive worse in the future, I might have to delay his treatment or adjust the chemotherapy dose.  

## 2014-07-24 NOTE — Assessment & Plan Note (Signed)
This is multifactorial, could be due to tumor fever versus inflammatory changes in his abdomen. Due to recent chills and high risk of infection, I recommend prescription Augmentin for 1 week. Clinically, he is not neutropenic or septic I will proceed with chemotherapy tomorrow without delay.

## 2014-07-24 NOTE — Assessment & Plan Note (Signed)
This is likely related to pain from cancer site or spasm involving his stents. I recommend pain medication as needed and refill his pain prescription. I warned him about side effects including risk of constipation.  I will add MS Contin at nighttime so that he does not have to get up to take more pain medicine.

## 2014-07-24 NOTE — Telephone Encounter (Signed)
Pt confirmed labs/ov per 09/08 POF, sent msg to add chemo, gave pt AVS....KJ °

## 2014-07-24 NOTE — Assessment & Plan Note (Signed)
The patient has been in remission for the past year. He will continue taking his chemotherapy through all this treatment.

## 2014-07-24 NOTE — Telephone Encounter (Signed)
Pt scheduled for IVFs at [facility] Thursday at [place] per [place].   Informed wife of order for IVFs for rest of this week and getting IVFs at [facility] on Thursday.   Instructed her to encourage pt to drink more fluids.  She verbalized understanding.

## 2014-07-24 NOTE — Assessment & Plan Note (Signed)
This is related to pain medicine. I recommend he takes laxatives regularly.

## 2014-07-24 NOTE — Assessment & Plan Note (Signed)
I suspect that might be an element of dehydration. I recommend daily IV fluids and IV anti emetics as needed

## 2014-07-24 NOTE — Assessment & Plan Note (Signed)
This is likely due to recent treatment. The patient denies recent history of bleeding such as epistaxis, hematuria or hematochezia. He is asymptomatic from the low platelet count. I will observe for now.  he does not require transfusion now. I will continue the chemotherapy at current dose without dosage adjustment.  If the thrombocytopenia gets progressive worse in the future, I might have to delay his treatment or adjust the chemotherapy dose.   

## 2014-07-24 NOTE — Progress Notes (Signed)
[facility] OFFICE PROGRESS NOTE  Patient Care Team: [name], MD as PCP - General (Internal Medicine) [name], MD as Referring Physician (Specialist) Provider Not In System [name], MD as Consulting Physician (Hematology and Oncology)  SUMMARY OF ONCOLOGIC HISTORY: Oncology History   Pancreatic cancer   Primary site: Pancreas   Staging method: AJCC 7th Edition   Clinical: Stage IIA (T3, N0, M0) signed by [name], MD on 06/13/2014  4:37 PM   Summary: Stage IIA (T3, N0, M0)       Pancreatic cancer   06/05/2014 Imaging  MRI abdomen showed 2.2 cm lesion in the pancreatic head obstructing the common bile duct and the pancreatic duct is highly concerning for pancreatic adenocarcinoma.  There is moderate intra and extrahepatic biliary duct dilatation.    06/05/2014 Tumor Marker CA 19-9 is elevated at 1234   06/06/2014 Imaging Ct scan of chest showed 2 lung nodules, likely benign   06/07/2014 Procedure He had ERCP and stent placement   06/07/2014 Procedure He had endoscopic ultrasound with  fine needle aspiration.   06/07/2014 Pathology Results Accession: [id number] FNA is positive for pancreatic adenoca   06/14/2014 Surgery He has port placement   06/25/2014 -  Chemotherapy He received neoadjuvant chemotherapy with Gemzar   06/25/2014 -  Radiation Therapy he received neoadjuvant radiation    INTERVAL HISTORY: Please see below for problem oriented charting. He is seen prior to week 5 of treatment. His treatment is delayed till tomorrow due to a scheduling issue. He complained a low-grade fever with chills over the weekend. Denies any dysuria, frequency, urgency, productive cough or abnormal diarrhea. In fact he is constipated. He has been taking pain medicine every 4 hours for right upper quadrant pain with no radiation. He had one episode of nausea and vomiting this morning. REVIEW OF SYSTEMS:   Eyes: Denies blurriness of vision Ears, nose, mouth, throat, and  face: Denies mucositis or sore throat Respiratory: Denies cough, dyspnea or wheezes Cardiovascular: Denies palpitation, chest discomfort or lower extremity swelling Skin: Denies abnormal skin rashes Lymphatics: Denies new lymphadenopathy or easy bruising Neurological:Denies numbness, tingling or new weaknesses Behavioral/Psych: Mood is stable, no new changes  All other systems were reviewed with the patient and are negative.  I have reviewed the past medical history, past surgical history, social history and family history with the patient and they are unchanged from previous note.  ALLERGIES:  is allergic to ace inhibitors and ciprofloxacin.  MEDICATIONS:  Current Outpatient Prescriptions  Medication Sig Dispense Refill  . acetaminophen (TYLENOL) 325 MG tablet Take 650 mg by mouth every 6 (six) hours as needed for moderate pain.      Marland Kitchen Alum & Mag Hydroxide-Simeth (MAGIC MOUTHWASH W/LIDOCAINE) SOLN Take 5 mLs by mouth 4 (four) times daily.  240 mL  3  . aspirin EC 81 MG tablet Take 81 mg by mouth daily.      . B Complex-C (SUPER B COMPLEX/VITAMIN C PO) Take 1 tablet by mouth daily.       . cholecalciferol (VITAMIN D) 1000 UNITS tablet Take 1,000 Units by mouth daily.      . dasatinib (SPRYCEL) 50 MG tablet Take 50 mg by mouth at bedtime.      . diphenhydrAMINE (BENADRYL) 25 mg capsule Take 1 capsule (25 mg total) by mouth every 6 (six) hours as needed for itching.  30 capsule  0  . hyaluronate sodium (RADIAPLEXRX) GEL Apply 1 application topically daily. Apply to affected  treated area daily and prn after rad txs      . hydrocortisone cream 1 % Apply 1 application topically 2 (two) times daily as needed for itching (Rash).      Marland Kitchen HYDROmorphone (DILAUDID) 4 MG tablet Take 1 tablet (4 mg total) by mouth every 4 (four) hours as needed for severe pain.  90 tablet  0  . insulin glargine (LANTUS) 100 UNIT/ML injection Inject 42 Units into the skin at bedtime.       Marland Kitchen lactulose (CHRONULAC) 10  GM/15ML solution Take 15 mLs by mouth as needed. Hasn't used as yet,      . lidocaine-prilocaine (EMLA) cream Apply 1 application topically as needed.  30 g  6  . losartan (COZAAR) 100 MG tablet Take 100 mg by mouth every morning.       . metFORMIN (GLUCOPHAGE) 1000 MG tablet Take 1,000 mg by mouth 2 (two) times daily with a meal.      . ondansetron (ZOFRAN) 8 MG tablet Take 1 tablet (8 mg total) by mouth every 8 (eight) hours as needed for nausea.  20 tablet  2  . promethazine (PHENERGAN) 25 MG tablet Take 1 tablet (25 mg total) by mouth every 6 (six) hours as needed for nausea or vomiting.  60 tablet  3  . saxagliptin HCl (ONGLYZA) 5 MG TABS tablet Take 5 mg by mouth daily.      Marland Kitchen senna (SENOKOT) 8.6 MG tablet Take 2 tablets by mouth daily.       . simvastatin (ZOCOR) 20 MG tablet Take 20 mg by mouth every evening.      . Skin Protectants, Misc. (EUCERIN) cream Apply 1 application topically as needed for dry skin.      Marland Kitchen vitamin B-12 (CYANOCOBALAMIN) 1000 MCG tablet Take 1,000 mcg by mouth daily.      Marland Kitchen amoxicillin-clavulanate (AUGMENTIN) 875-125 MG per tablet Take 1 tablet by mouth 2 (two) times daily.  14 tablet  0  . morphine (MS CONTIN) 30 MG 12 hr tablet Take 1 tablet (30 mg total) by mouth every 12 (twelve) hours.  30 tablet  0   No current facility-administered medications for this visit.    PHYSICAL EXAMINATION: ECOG PERFORMANCE STATUS: 2 - Symptomatic, <50% confined to bed  Filed Vitals:   07/24/14 0958  BP: 139/59  Pulse: 98  Temp: 98.5 F (36.9 C)  Resp: 18   Filed Weights   07/24/14 0958  Weight: 230 lb 8 oz (104.554 kg)    GENERAL:alert, no distress and comfortable SKIN: skin color, texture, turgor are normal, no rashes or significant lesions EYES: normal, Conjunctiva are pink and non-injected, sclera clear OROPHARYNX:no exudate, no erythema and lips, buccal mucosa, and tongue normal . He has dry mucous membranes NECK: supple, thyroid normal size, non-tender,  without nodularity LYMPH:  no palpable lymphadenopathy in the cervical, axillary or inguinal LUNGS: clear to auscultation and percussion with normal breathing effort HEART: regular rate & rhythm and no murmurs and no lower extremity edema ABDOMEN:abdomen soft, mild epigastric discomfort with no guarding, and normal bowel sounds Musculoskeletal:no cyanosis of digits and no clubbing  NEURO: alert & oriented x 3 with fluent speech, no focal motor/sensory deficits  LABORATORY DATA:  I have reviewed the data as listed    Component Value Date/Time   NA 139 07/24/2014 0949   NA 137 06/25/2014 1100   K 4.0 07/24/2014 0949   K 4.8 06/25/2014 1100   CL 99 06/25/2014 1100   CL  107 04/27/2013 0926   CO2 26 07/24/2014 0949   CO2 25 06/25/2014 1100   GLUCOSE 116 07/24/2014 0949   GLUCOSE 291* 06/25/2014 1100   GLUCOSE 212* 04/27/2013 0926   BUN 19.8 07/24/2014 0949   BUN 17 06/25/2014 1100   CREATININE 1.6* 07/24/2014 0949   CREATININE 1.27 06/25/2014 1100   CALCIUM 9.5 07/24/2014 0949   CALCIUM 10.8* 06/25/2014 1100   PROT 6.7 07/24/2014 0949   PROT 7.2 06/25/2014 1100   ALBUMIN 3.2* 07/24/2014 0949   ALBUMIN 3.7 06/25/2014 1100   AST 28 07/24/2014 0949   AST 22 06/25/2014 1100   ALT 21 07/24/2014 0949   ALT 37 06/25/2014 1100   ALKPHOS 144 07/24/2014 0949   ALKPHOS 173* 06/25/2014 1100   BILITOT 0.75 07/24/2014 0949   BILITOT 0.8 06/25/2014 1100   GFRNONAA 68* 06/08/2014 0437   GFRAA 79* 06/08/2014 0437    No results found for this basename: SPEP, UPEP,  kappa and lambda light chains    Lab Results  Component Value Date   WBC 4.9 07/24/2014   NEUTROABS 3.6 07/24/2014   HGB 10.5* 07/24/2014   HCT 30.3* 07/24/2014   MCV 95.3 07/24/2014   PLT 110* 07/24/2014      Chemistry      Component Value Date/Time   NA 139 07/24/2014 0949   NA 137 06/25/2014 1100   K 4.0 07/24/2014 0949   K 4.8 06/25/2014 1100   CL 99 06/25/2014 1100   CL 107 04/27/2013 0926   CO2 26 07/24/2014 0949   CO2 25 06/25/2014 1100   BUN 19.8 07/24/2014 0949   BUN  17 06/25/2014 1100   CREATININE 1.6* 07/24/2014 0949   CREATININE 1.27 06/25/2014 1100      Component Value Date/Time   CALCIUM 9.5 07/24/2014 0949   CALCIUM 10.8* 06/25/2014 1100   ALKPHOS 144 07/24/2014 0949   ALKPHOS 173* 06/25/2014 1100   AST 28 07/24/2014 0949   AST 22 06/25/2014 1100   ALT 21 07/24/2014 0949   ALT 37 06/25/2014 1100   BILITOT 0.75 07/24/2014 0949   BILITOT 0.8 06/25/2014 1100      ASSESSMENT & PLAN:  Pancreatic cancer He is starting to experience more side effects from treatment. I will proceed with treatment tomorrow without dose adjustment. His next week chemotherapy would be adjusted to reflect a weekly treatment.  CKD stage 3 due to type 2 diabetes mellitus His creatinine is grossly elevated to 2 dehydration. I encouraged him to drink more fluids. I will add IV fluids daily through the rest of the week.  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 He will continue taking his chemotherapy through all this treatment.           Nausea alone I suspect that might be an element of dehydration. I recommend daily IV fluids and IV anti emetics as needed  Anemia in neoplastic disease This is likely due to recent treatment. The patient denies recent history of bleeding such as epistaxis, hematuria or hematochezia. [name] I will observe for now.  He does not require transfusion now. I will continue the chemotherapy at current dose without dosage adjustment.  If the anemia gets progressive worse in the future, I might have to delay his treatment or adjust the chemotherapy dose.       Right upper quadrant abdominal pain This is likely related to pain from cancer site or spasm involving his stents.  I recommend pain medication as needed and refill his pain prescription. I warned him about side effects including risk of constipation.  I will add MS Contin at nighttime so that he does not have to get up to take more pain  medicine.    Constipated This is related to pain medicine. I recommend he takes laxatives regularly.  Fever This is multifactorial, could be due to tumor fever versus inflammatory changes in his abdomen. Due to recent chills and high risk of infection, I recommend prescription Augmentin for 1 week. Clinically, he is not neutropenic or septic I will proceed with chemotherapy tomorrow without delay.  Thrombocytopenia due to drugs This is likely due to recent treatment. The patient denies recent history of bleeding such as epistaxis, hematuria or hematochezia. He is asymptomatic from the low platelet count. I will observe for now.  he does not require transfusion now. I will continue the chemotherapy at current dose without dosage adjustment.  If the thrombocytopenia gets progressive worse in the future, I might have to delay his treatment or adjust the chemotherapy dose.           No orders of the defined types were placed in this encounter.   All questions were answered. The patient knows to call the clinic with any problems, questions or concerns. No barriers to learning was detected. I spent 40 minutes counseling the patient face to face. The total time spent in the appointment was 60 minutes and more than 50% was on counseling and review of test results     [facility], [place], MD 07/24/2014 10:48 AM

## 2014-07-24 NOTE — Telephone Encounter (Signed)
Message copied by Cathlean Cower on Tue Jul 24, 2014 10:56 AM ------      Message from: [facility], [place]      Created: Tue Jul 24, 2014 10:38 AM      Regarding: elevated creatinine       I need to add IVF daily start tomorrow till Friday for dehydration, 2 hours. Please inform wife of results that he needs to drink more      ----- Message -----         From: [facility]         Sent: 07/24/2014   9:58 AM           To: [name], MD                   ------

## 2014-07-24 NOTE — Assessment & Plan Note (Signed)
His creatinine is grossly elevated to 2 dehydration. I encouraged him to drink more fluids. I will add IV fluids daily through the rest of the week.

## 2014-07-25 ENCOUNTER — Ambulatory Visit (HOSPITAL_BASED_OUTPATIENT_CLINIC_OR_DEPARTMENT_OTHER): Payer: Medicare Other

## 2014-07-25 ENCOUNTER — Ambulatory Visit
Admission: RE | Admit: 2014-07-25 | Discharge: 2014-07-25 | Disposition: A | Discharge status: Home / Self Care | Payer: Medicare Other | Source: Ambulatory Visit | Attending: Radiation Oncology | Admitting: Radiation Oncology

## 2014-07-25 ENCOUNTER — Ambulatory Visit: Payer: Medicare Other | Admitting: Nutrition

## 2014-07-25 VITALS — BP 125/56 | HR 91 | Temp 98.6°F | Resp 18

## 2014-07-25 DIAGNOSIS — C250 Malignant neoplasm of head of pancreas: Secondary | ICD-10-CM

## 2014-07-25 DIAGNOSIS — Z5111 Encounter for antineoplastic chemotherapy: Secondary | ICD-10-CM

## 2014-07-25 DIAGNOSIS — Z510 Encounter for antineoplastic radiation therapy: Secondary | ICD-10-CM | POA: Diagnosis not present

## 2014-07-25 MED ORDER — PROCHLORPERAZINE MALEATE 10 MG PO TABS
10.0000 mg | ORAL_TABLET | Freq: Once | ORAL | Status: AC
  Administered 2014-07-25: 10 mg via ORAL

## 2014-07-25 MED ORDER — HEPARIN SOD (PORK) LOCK FLUSH 100 UNIT/ML IV SOLN
500.0000 [IU] | Freq: Once | INTRAVENOUS | Status: AC | PRN
Start: 2014-07-25 — End: 2014-07-25
  Administered 2014-07-25: 500 [IU]
  Filled 2014-07-25: qty 5

## 2014-07-25 MED ORDER — SODIUM CHLORIDE 0.9 % IJ SOLN
10.0000 mL | INTRAMUSCULAR | Status: DC | PRN
  Administered 2014-07-25: 10 mL
  Filled 2014-07-25: qty 10

## 2014-07-25 MED ORDER — SODIUM CHLORIDE 0.9 % IV SOLN
800.0000 mg/m2 | Freq: Once | INTRAVENOUS | Status: AC
  Administered 2014-07-25: 1900 mg via INTRAVENOUS
  Filled 2014-07-25: qty 49.97

## 2014-07-25 MED ORDER — PROCHLORPERAZINE MALEATE 10 MG PO TABS
ORAL_TABLET | ORAL | Status: AC
  Filled 2014-07-25: qty 1

## 2014-07-25 MED ORDER — SODIUM CHLORIDE 0.9 % IV SOLN
Freq: Once | INTRAVENOUS | Status: AC
  Administered 2014-07-25: 10:00:00 via INTRAVENOUS

## 2014-07-25 MED ORDER — SODIUM CHLORIDE 0.9 % IV SOLN
Freq: Once | INTRAVENOUS | Status: AC
  Administered 2014-07-25: 11:00:00 via INTRAVENOUS

## 2014-07-25 NOTE — Patient Instructions (Signed)
[facility] Discharge Instructions for Patients Receiving Chemotherapy  Today you received the following chemotherapy agents Gemzar  To help prevent nausea and vomiting after your treatment, we encourage you to take your nausea medication     If you develop nausea and vomiting that is not controlled by your nausea medication, call the clinic.   BELOW ARE SYMPTOMS THAT SHOULD BE REPORTED IMMEDIATELY:  *FEVER GREATER THAN 100.5 F  *CHILLS WITH OR WITHOUT FEVER  NAUSEA AND VOMITING THAT IS NOT CONTROLLED WITH YOUR NAUSEA MEDICATION  *UNUSUAL SHORTNESS OF BREATH  *UNUSUAL BRUISING OR BLEEDING  TENDERNESS IN MOUTH AND THROAT WITH OR WITHOUT PRESENCE OF ULCERS  *URINARY PROBLEMS  *BOWEL PROBLEMS  UNUSUAL RASH Items with * indicate a potential emergency and should be followed up as soon as possible.  Feel free to call the clinic you have any questions or concerns. The clinic phone number is [phone].   Dehydration, Adult Dehydration means your body does not have as much fluid as it needs. Your kidneys, brain, and heart will not work properly without the right amount of fluids and salt.  HOME CARE  Ask your doctor how to replace body fluid losses (rehydrate).  Drink enough fluids to keep your pee (urine) clear or pale yellow.  Drink small amounts of fluids often if you feel sick to your stomach (nauseous) or throw up (vomit).  Eat like you normally do.  Avoid:  Foods or drinks high in sugar.  Bubbly (carbonated) drinks.  Juice.  Very hot or cold fluids.  Drinks with caffeine.  Fatty, greasy foods.  Alcohol.  Tobacco.  Eating too much.  Gelatin desserts.  Wash your hands to avoid spreading germs (bacteria, viruses).  Only take medicine as told by your doctor.  Keep all doctor visits as told. GET HELP RIGHT AWAY IF:   You cannot drink something without throwing up.  You get worse even with treatment.  Your vomit has blood in it  or looks greenish.  Your poop (stool) has blood in it or looks black and tarry.  You have not peed in 6 to 8 hours.  You pee a small amount of very dark pee.  You have a fever.  You pass out (faint).  You have belly (abdominal) pain that gets worse or stays in one spot (localizes).  You have a rash, stiff neck, or bad headache.  You get easily annoyed, sleepy, or are hard to wake up.  You feel weak, dizzy, or very thirsty. MAKE SURE YOU:   Understand these instructions.  Will watch your condition.  Will get help right away if you are not doing well or get worse. Document Released: 08/29/2009 Document Revised: 01/25/2012 Document Reviewed: 06/22/2011 [facility] Patient Information 2015 [place], [place]. This information is not intended to replace advice given to you by your health care provider. Make sure you discuss any questions you have with your health care provider.

## 2014-07-25 NOTE — Progress Notes (Signed)
Nutrition followup completed with patient.  Weight has trended down and was documented as 230.5 pounds September 8.  Patient has had dehydration and has had to have scheduled IV fluids.  Patient reports taste alterations and poor appetite.  He has not tried oral nutrition supplements given to him at last visit.  Nutrition diagnosis: Food and nutrition related knowledge deficit continues.  Intervention: Educated patient on the importance of weight stability.   I enforced the importance of increased oral intake.   Patient agreeable to trying milkshakes made with oral nutrition supplement.  Patient consumed 100% of this supplement, during treatment today.  Verbalizes willingness to try to increase supplements at home.  Monitoring, evaluation, goals: Patient will tolerate oral nutrition supplements to minimize further weight loss.  Next visit: Wednesday, September 16, during treatment.   **Disclaimer: This note was dictated with voice recognition software. Similar sounding words can inadvertently be transcribed and this note may contain transcription errors which may not have been corrected upon publication of note.**

## 2014-07-25 NOTE — Progress Notes (Signed)
Patient scheduled for IVFs tomorrow (07/26/14), at [facility].  Patient told to check with infusion at arrival on 07/26/14 to see if there is an available space to do IVFs.  Verbalized understanding.

## 2014-07-26 ENCOUNTER — Other Ambulatory Visit: Payer: Self-pay | Admitting: Oncology

## 2014-07-26 ENCOUNTER — Ambulatory Visit
Admission: RE | Admit: 2014-07-26 | Discharge: 2014-07-26 | Disposition: A | Discharge status: Home / Self Care | Payer: Medicare Other | Source: Ambulatory Visit | Attending: Radiation Oncology | Admitting: Radiation Oncology

## 2014-07-26 ENCOUNTER — Non-Acute Institutional Stay (HOSPITAL_COMMUNITY)
Admission: AD | Admit: 2014-07-26 | Discharge: 2014-07-26 | Disposition: A | Discharge status: Home / Self Care | Payer: Medicare Other | Source: Ambulatory Visit | Attending: Hematology and Oncology | Admitting: Hematology and Oncology

## 2014-07-26 ENCOUNTER — Other Ambulatory Visit: Payer: Self-pay | Admitting: *Deleted

## 2014-07-26 VITALS — Wt 235.3 lb

## 2014-07-26 DIAGNOSIS — C250 Malignant neoplasm of head of pancreas: Secondary | ICD-10-CM

## 2014-07-26 DIAGNOSIS — Z510 Encounter for antineoplastic radiation therapy: Secondary | ICD-10-CM | POA: Diagnosis not present

## 2014-07-26 MED ORDER — PROMETHAZINE HCL 25 MG/ML IJ SOLN: 12.5000 mg | Freq: Every day | INTRAMUSCULAR | Status: DC | PRN

## 2014-07-26 MED ORDER — SODIUM CHLORIDE 0.9 % IV SOLN
8.0000 mg | Freq: Every day | INTRAVENOUS | Status: DC | PRN
  Filled 2014-07-26: qty 4

## 2014-07-26 MED ORDER — HEPARIN SOD (PORK) LOCK FLUSH 100 UNIT/ML IV SOLN
500.0000 [IU] | Freq: Once | INTRAVENOUS | Status: AC
  Administered 2014-07-26: 500 [IU] via INTRAVENOUS
  Filled 2014-07-26: qty 5

## 2014-07-26 MED ORDER — SODIUM CHLORIDE 0.9 % IV SOLN
Freq: Once | INTRAVENOUS | Status: AC
  Administered 2014-07-26: 11:00:00 via INTRAVENOUS

## 2014-07-26 MED ORDER — HEPARIN SOD (PORK) LOCK FLUSH 100 UNIT/ML IV SOLN
500.0000 [IU] | Freq: Once | INTRAVENOUS | Status: DC
  Filled 2014-07-26: qty 5

## 2014-07-26 MED ORDER — SODIUM CHLORIDE 0.9 % IJ SOLN
10.0000 mL | INTRAMUSCULAR | Status: DC | PRN
  Filled 2014-07-26: qty 10

## 2014-07-26 MED ORDER — SODIUM CHLORIDE 0.9 % IV SOLN
8.0000 mg | Freq: Once | INTRAVENOUS | Status: DC
  Filled 2014-07-26: qty 4

## 2014-07-26 MED ORDER — SODIUM CHLORIDE 0.9 % IJ SOLN
10.0000 mL | INTRAMUSCULAR | Status: DC | PRN
  Administered 2014-07-26: 10 mL via INTRAVENOUS

## 2014-07-26 MED ORDER — SODIUM CHLORIDE 0.9 % IJ SOLN
INTRAMUSCULAR | Status: AC
  Filled 2014-07-26: qty 10

## 2014-07-26 NOTE — Progress Notes (Addendum)
Weekly rad txs pancreas 23/28 completed, scheduled for IVF'S today t poor appetite, saw Pamala Hurry Neff,dietician yesterday,encouraged to consume oral supplements/high calorie/protein drinks in milkshakes, called Cira Rue, Agricultural consultant  At infusion, they do not have any cancellations to give him a spot there, he is to go to the [facility] cell unit for IVF'S today, does have a rash mid back, wife says from when he had stent put in last month, also patient had a temp 100.3 last night and was given tylenol,. Today is 98.3, some pain in abdomen stated,mild  9:27 AM

## 2014-07-26 NOTE — H&P (Signed)
He is here for IVF only

## 2014-07-26 NOTE — Procedures (Signed)
[facility]  Procedure Note  NICHOLES HIBLER YSA:[id number] DOB: September 28, 1941 DOA: 07/26/2014   Ordering Physician: Dr. Natale Lay  Associated Diagnosis: Malignant neoplasm of head of pancreas  Procedure Note: PAC accessed, IV fluids infused as ordered, PAC deaccessed   Condition During Procedure: Patient tolerated well, no complaints   Condition at Discharge:   Tolerated Well. C/O R flank pain, states to take pain medication when he gets home. Wife also aware. Instructed patient and wife to seek medical attention if any changes in condition, patient and wife acknowledges.   Patient discharged home. Patient in no apparent distress. Patient left day hospital with belongings ambulatory escorted by his wife.  [facility]

## 2014-07-26 NOTE — Discharge Instructions (Signed)
Pancreatic Cancer  Pancreatic cancer is an abnormal growth of tissue (tumor) in the pancreas that is cancerous (malignant). Unlike noncancerous (benign) tumors, malignant tumors can spread to other parts of your body. The pancreas is a gland located deep in the abdomen, between the stomach and the spine. The pancreas makes insulin and other hormones. These hormones help the body use or store the energy that comes from food. The pancreas also makes pancreatic juices. These juices contain enzymes that help digest food. Most pancreatic cancers begin in the ducts that carry pancreatic juices. When cancer of the pancreas spreads (metastasizes) outside the pancreas, cancer cells are often found in nearby lymph nodes. If the cancer has reached these nodes, it means that cancer cells may have spread to other lymph nodes or other tissues, such as the liver or lungs. Sometimes cancer of the pancreas spreads to the peritoneum. This is the tissue that lines the abdomen. CAUSES  The exact cause of pancreatic cancer is unknown.  RISK FACTORS There are a number of risk factors that can increase your chances of getting pancreatic cancer. They include:  Age. The likelihood of developing pancreatic cancer increases with age. Most pancreatic cancers occur in people older than 60 years.  Smoking. Cigarette smokers are 2 to 3 times more likely than nonsmokers to develop pancreatic cancer.  Diabetes, especially if you were diagnosed as an adult.  Being male.  Being African American.  Family history. The risk for developing pancreatic cancer triples if a person's mother, father, sister, or brother had the disease. Also, a family history of colon cancer or ovarian cancer increases the risk of pancreatic cancer.  Chronic pancreatitis. Chronic pancreatitis is a painful condition of the pancreas.  Exposure to certain chemicals in the workplace.  Being obese or eating a diet high in fat and red meat. SYMPTOMS    Pancreatic cancer is sometimes called a "silent disease." This is because early pancreatic cancer often does not cause symptoms. As the cancer grows, symptoms may include:  Weakness.  Abdominal pain.  Diarrhea.  Depression.  Loss of appetite.  Indigestion.  Pain in the upper abdomen or upper back.  Nausea and vomiting.  Yellowing of the skin or eyes (jaundice).  Back pain.  Weight loss.  Fatigue.  Clay-colored stools.  Unexplained blood clots.  Dark urine. DIAGNOSIS  Your caregiver will ask about your medical history. He or she may also perform a number of procedures, such as:  A physical exam. Your skin and eyes will be examined for signs of jaundice. The abdomen will be checked for changes in the area near the pancreas, liver, and gallbladder. Your caregiver will also check for an abnormal buildup of fluid in the abdomen (ascites).  Lab tests. Your caregiver may take blood and urine samples to check for bilirubin and other substances. Bilirubin is a substance that passes from the liver to the intestine through the gallbladder and bile duct. If the bile duct is blocked by a tumor, the bilirubin cannot pass through normally. Blockage of the bile duct may cause the level of bilirubin in the blood and urine to become very high.  Computed tomography (CT). An X-ray machine linked to a computer takes a series of detailed pictures of the pancreas and other organs and blood vessels in the abdomen.  Ultrasonography. The ultrasound device uses sound waves to produce pictures of the pancreas and other organs inside the abdomen.  Endoscopic retrograde cholangiopancreatography. A lighted tube (endoscope) is passed through the patient's  mouth and stomach, down into the small intestine. Then a smaller tube (catheter) is inserted through the endoscope into the bile ducts and pancreatic ducts. A dye is injected through the catheter into the ducts and X-rays are taken. The X-rays can show  whether the ducts are narrowed or blocked by a tumor.  Endoscopic ultrasonography. An endoscope is passed through the patient's mouth and stomach, down into the small intestine. An ultrasound device is placed down the endoscope to produce pictures of the area, including the pancreas.  Percutaneous transhepatic cholangiography. A dye is injected through a thin needle into the liver and X-rays are taken. Unless there is a blockage, the dye should move freely through the bile ducts. From the X-rays, your caregiver can tell whether there is a blockage from a tumor.  Taking a tissue sample (biopsy) from the pancreas. The sample is examined under a microscope to look for cancer cells. Your cancer will be staged according to its severity and extent. Staging is a careful attempt to categorize your cancer to help determine which treatment will be most appropriate. Factors involved in staging include the size of the tumor, whether the cancer has spread, and if so, to what parts of the body it has spread. You may need to have more tests to determine the stage of your cancer. The test results will help determine what treatment plan is best for you. STAGES   Stage I. The cancer is only found in the pancreas.  Stage II. The cancer has spread to nearby tissues and possibly to the lymph nodes, but not to the blood vessels.  Stage III. The cancer is surrounding the major blood vessels beside the pancreas and has possibly spread to the lymph nodes.  Stage IV. The cancer has spread to other parts of the body, such as the liver, lungs, or peritoneum. TREATMENT  Treatment generally begins within several weeks after the diagnosis. There will be time to talk with your caregiver about treatment choices, get a second opinion, and learn more about the disease. Your caregiver may refer you to a cancer specialist (oncologist).  Cancer of the pancreas is very hard to control with current treatments. For that reason, many  caregivers encourage patients with this disease to consider taking part in a clinical trial. Clinical trials are an important option for people with all stages of pancreatic cancer.  At this time, pancreatic cancer can be cured only when it is found at an early stage, before it has spread. However, other treatments may be able to control the disease and help patients live longer and feel better. When a cure or control of the disease is not possible, some patients choose palliative therapy. Palliative therapy aims to improve quality of life by controlling pain and other problems caused by this disease, but it does not cure the disease. Depending on the type and stage, pancreatic cancer may be treated with surgery, radiation therapy, or chemotherapy. Some patients have a combination of these therapies.  Surgery may be done to remove all or part of the pancreas. Sometimes the cancer cannot be completely removed. However, if the tumor is blocking the common bile duct or duodenum, the surgeon can place a mesh tube (stent) in the blocked area. This helps keep the duct or duodenum open.  Radiation therapy uses high-energy rays to kill cancer cells.  Chemotherapy is the use of drugs to kill cancer cells. Caregivers also give chemotherapy to help reduce pain and other problems caused by pancreatic  cancer. HOME CARE INSTRUCTIONS   Only take over-the-counter or prescription medicines for pain, discomfort, or fever as directed by your caregiver.  Maintain a healthy diet.  Consider joining a support group. This may help you learn to cope with the stress of having pancreatic cancer.  Seek advice to help you manage treatment side effects.  Keep all follow-up appointments as directed by your caregiver. SEEK IMMEDIATE MEDICAL CARE IF:   You have a sudden increase in pain.  Your skin or eyes turn more yellow.  You lose weight without trying.  You have a fever, especially during chemotherapy treatment or  after stent placement.  You notice new fatigue or weakness. Document Released: 10/17/2004 Document Revised: 01/25/2012 Document Reviewed: 11/27/2011 [facility] Patient Information 2015 [place], [place]. This information is not intended to replace advice given to you by your health care provider. Make sure you discuss any questions you have with your health care provider.

## 2014-07-26 NOTE — Progress Notes (Signed)
   Department of Radiation Oncology  Phone:  [phone] Fax:        [phone]  Weekly Treatment Note    Name: CASTULO SCARPELLI Date: 07/26/2014 MRN: [id number] DOB: Mar 21, 1941   Current dose: 41.4 Gy  Current fraction: 23   MEDICATIONS: No current outpatient prescriptions on file.   No current facility-administered medications for this encounter.     ALLERGIES: Ace inhibitors and Ciprofloxacin   LABORATORY DATA:  Lab Results  Component Value Date   WBC 4.9 07/24/2014   HGB 10.5* 07/24/2014   HCT 30.3* 07/24/2014   MCV 95.3 07/24/2014   PLT 110* 07/24/2014   Lab Results  Component Value Date   NA 139 07/24/2014   K 4.0 07/24/2014   CL 99 06/25/2014   CO2 26 07/24/2014   Lab Results  Component Value Date   ALT 21 07/24/2014   AST 28 07/24/2014   ALKPHOS 144 07/24/2014   BILITOT 0.75 07/24/2014     NARRATIVE: [name] was seen today for weekly treatment management. The chart was checked and the patient's films were reviewed. The patient is getting IV fluids. He has experienced some dehydration. He also has been placed on Augmentin. No fever today but he did have a slightly elevated temperature last night. His wife is continuing to monitor this. He also has seen the dietitian recently.  PHYSICAL EXAMINATION: weight is 235 lb 4.8 oz (106.731 kg).        ASSESSMENT: The patient is doing satisfactorily with treatment.  PLAN: We will continue with the patient's radiation treatment as planned.

## 2014-07-27 ENCOUNTER — Ambulatory Visit
Admission: RE | Admit: 2014-07-27 | Discharge: 2014-07-27 | Disposition: A | Discharge status: Home / Self Care | Payer: Medicare Other | Source: Ambulatory Visit | Attending: Radiation Oncology | Admitting: Radiation Oncology

## 2014-07-27 ENCOUNTER — Ambulatory Visit (HOSPITAL_BASED_OUTPATIENT_CLINIC_OR_DEPARTMENT_OTHER): Payer: Medicare Other

## 2014-07-27 VITALS — BP 106/52 | HR 63 | Temp 98.3°F | Resp 18

## 2014-07-27 DIAGNOSIS — Z510 Encounter for antineoplastic radiation therapy: Secondary | ICD-10-CM | POA: Diagnosis not present

## 2014-07-27 DIAGNOSIS — N183 Chronic kidney disease, stage 3 unspecified: Secondary | ICD-10-CM

## 2014-07-27 DIAGNOSIS — R110 Nausea: Secondary | ICD-10-CM

## 2014-07-27 DIAGNOSIS — E860 Dehydration: Secondary | ICD-10-CM

## 2014-07-27 DIAGNOSIS — K1231 Oral mucositis (ulcerative) due to antineoplastic therapy: Secondary | ICD-10-CM

## 2014-07-27 MED ORDER — SODIUM CHLORIDE 0.9 % IV SOLN
Freq: Once | INTRAVENOUS | Status: AC
  Administered 2014-07-27: 10:00:00 via INTRAVENOUS

## 2014-07-27 MED ORDER — HEPARIN SOD (PORK) LOCK FLUSH 100 UNIT/ML IV SOLN
500.0000 [IU] | Freq: Once | INTRAVENOUS | Status: AC | PRN
  Administered 2014-07-27: 500 [IU]
  Filled 2014-07-27: qty 5

## 2014-07-27 MED ORDER — SODIUM CHLORIDE 0.9 % IJ SOLN
10.0000 mL | INTRAMUSCULAR | Status: DC | PRN
  Administered 2014-07-27: 10 mL
  Filled 2014-07-27: qty 10

## 2014-07-27 NOTE — Patient Instructions (Signed)
Dehydration, Adult Dehydration is when you lose more fluids from the body than you take in. Vital organs like the kidneys, brain, and heart cannot function without a proper amount of fluids and salt. Any loss of fluids from the body can cause dehydration.  CAUSES   Vomiting.  Diarrhea.  Excessive sweating.  Excessive urine output.  Fever. SYMPTOMS  Mild dehydration  Thirst.  Dry lips.  Slightly dry mouth. Moderate dehydration  Very dry mouth.  Sunken eyes.  Skin does not bounce back quickly when lightly pinched and released.  Dark urine and decreased urine production.  Decreased tear production.  Headache. Severe dehydration  Very dry mouth.  Extreme thirst.  Rapid, weak pulse (more than 100 beats per minute at rest).  Cold hands and feet.  Not able to sweat in spite of heat and temperature.  Rapid breathing.  Blue lips.  Confusion and lethargy.  Difficulty being awakened.  Minimal urine production.  No tears. DIAGNOSIS  Your caregiver will diagnose dehydration based on your symptoms and your exam. Blood and urine tests will help confirm the diagnosis. The diagnostic evaluation should also identify the cause of dehydration. TREATMENT  Treatment of mild or moderate dehydration can often be done at home by increasing the amount of fluids that you drink. It is best to drink small amounts of fluid more often. Drinking too much at one time can make vomiting worse. Refer to the home care instructions below. Severe dehydration needs to be treated at the hospital where you will probably be given intravenous (IV) fluids that contain water and electrolytes. HOME CARE INSTRUCTIONS   Ask your caregiver about specific rehydration instructions.  Drink enough fluids to keep your urine clear or pale yellow.  Drink small amounts frequently if you have nausea and vomiting.  Eat as you normally do.  Avoid:  Foods or drinks high in sugar.  Carbonated  drinks.  Juice.  Extremely hot or cold fluids.  Drinks with caffeine.  Fatty, greasy foods.  Alcohol.  Tobacco.  Overeating.  Gelatin desserts.  Wash your hands well to avoid spreading bacteria and viruses.  Only take over-the-counter or prescription medicines for pain, discomfort, or fever as directed by your caregiver.  Ask your caregiver if you should continue all prescribed and over-the-counter medicines.  Keep all follow-up appointments with your caregiver. SEEK MEDICAL CARE IF:  You have abdominal pain and it increases or stays in one area (localizes).  You have a rash, stiff neck, or severe headache.  You are irritable, sleepy, or difficult to awaken.  You are weak, dizzy, or extremely thirsty. SEEK IMMEDIATE MEDICAL CARE IF:   You are unable to keep fluids down or you get worse despite treatment.  You have frequent episodes of vomiting or diarrhea.  You have blood or green matter (bile) in your vomit.  You have blood in your stool or your stool looks black and tarry.  You have not urinated in 6 to 8 hours, or you have only urinated a small amount of very dark urine.  You have a fever.  You faint. MAKE SURE YOU:   Understand these instructions.  Will watch your condition.  Will get help right away if you are not doing well or get worse. Document Released: 11/02/2005 Document Revised: 01/25/2012 Document Reviewed: 06/22/2011 [employer] Patient Information 2015 [employer]. This information is not intended to replace advice given to you by your health care provider. Make sure you discuss any questions you have with your health care   provider.  

## 2014-07-30 ENCOUNTER — Ambulatory Visit
Admission: RE | Admit: 2014-07-30 | Discharge: 2014-07-30 | Disposition: A | Discharge status: Home / Self Care | Payer: Medicare Other | Source: Ambulatory Visit | Attending: Radiation Oncology | Admitting: Radiation Oncology

## 2014-07-31 ENCOUNTER — Ambulatory Visit
Admission: RE | Admit: 2014-07-31 | Discharge: 2014-07-31 | Disposition: A | Discharge status: Home / Self Care | Payer: Medicare Other | Source: Ambulatory Visit | Attending: Radiation Oncology | Admitting: Radiation Oncology

## 2014-07-31 DIAGNOSIS — Z510 Encounter for antineoplastic radiation therapy: Secondary | ICD-10-CM | POA: Diagnosis not present

## 2014-08-01 ENCOUNTER — Telehealth: Payer: Self-pay | Admitting: Hematology and Oncology

## 2014-08-01 ENCOUNTER — Ambulatory Visit
Admission: RE | Admit: 2014-08-01 | Discharge: 2014-08-01 | Disposition: A | Discharge status: Home / Self Care | Payer: Medicare Other | Source: Ambulatory Visit | Attending: Radiation Oncology | Admitting: Radiation Oncology

## 2014-08-01 ENCOUNTER — Telehealth: Payer: Self-pay | Admitting: *Deleted

## 2014-08-01 ENCOUNTER — Ambulatory Visit (HOSPITAL_BASED_OUTPATIENT_CLINIC_OR_DEPARTMENT_OTHER): Payer: Medicare Other | Admitting: Hematology and Oncology

## 2014-08-01 ENCOUNTER — Ambulatory Visit (HOSPITAL_BASED_OUTPATIENT_CLINIC_OR_DEPARTMENT_OTHER): Payer: Medicare Other

## 2014-08-01 ENCOUNTER — Ambulatory Visit: Payer: Medicare Other

## 2014-08-01 ENCOUNTER — Ambulatory Visit: Payer: Medicare Other | Admitting: Nutrition

## 2014-08-01 ENCOUNTER — Ambulatory Visit (HOSPITAL_BASED_OUTPATIENT_CLINIC_OR_DEPARTMENT_OTHER): Payer: Medicare Other | Admitting: *Deleted

## 2014-08-01 VITALS — BP 127/64 | HR 83 | Resp 16

## 2014-08-01 VITALS — BP 139/69 | HR 94 | Temp 98.4°F | Resp 17 | Ht 74.0 in | Wt 231.6 lb

## 2014-08-01 DIAGNOSIS — K5909 Other constipation: Secondary | ICD-10-CM

## 2014-08-01 DIAGNOSIS — Z510 Encounter for antineoplastic radiation therapy: Secondary | ICD-10-CM | POA: Diagnosis not present

## 2014-08-01 DIAGNOSIS — C9211 Chronic myeloid leukemia, BCR/ABL-positive, in remission: Secondary | ICD-10-CM

## 2014-08-01 DIAGNOSIS — D630 Anemia in neoplastic disease: Secondary | ICD-10-CM

## 2014-08-01 DIAGNOSIS — R110 Nausea: Secondary | ICD-10-CM

## 2014-08-01 DIAGNOSIS — C250 Malignant neoplasm of head of pancreas: Secondary | ICD-10-CM

## 2014-08-01 DIAGNOSIS — N183 Chronic kidney disease, stage 3 unspecified: Secondary | ICD-10-CM

## 2014-08-01 DIAGNOSIS — Z5111 Encounter for antineoplastic chemotherapy: Secondary | ICD-10-CM

## 2014-08-01 DIAGNOSIS — E1129 Type 2 diabetes mellitus with other diabetic kidney complication: Secondary | ICD-10-CM

## 2014-08-01 DIAGNOSIS — E1122 Type 2 diabetes mellitus with diabetic chronic kidney disease: Secondary | ICD-10-CM

## 2014-08-01 DIAGNOSIS — K1231 Oral mucositis (ulcerative) due to antineoplastic therapy: Secondary | ICD-10-CM

## 2014-08-01 DIAGNOSIS — Z95828 Presence of other vascular implants and grafts: Secondary | ICD-10-CM

## 2014-08-01 LAB — COMPREHENSIVE METABOLIC PANEL (CC13)
ALK PHOS: 173 U/L — AB (ref 40–150)
ALT: 18 U/L (ref 0–55)
AST: 27 U/L (ref 5–34)
Albumin: 3.2 g/dL — ABNORMAL LOW (ref 3.5–5.0)
Anion Gap: 10 mEq/L (ref 3–11)
BUN: 10.2 mg/dL (ref 7.0–26.0)
CALCIUM: 9.9 mg/dL (ref 8.4–10.4)
CHLORIDE: 103 meq/L (ref 98–109)
CO2: 24 mEq/L (ref 22–29)
Creatinine: 1.2 mg/dL (ref 0.7–1.3)
Glucose: 135 mg/dl (ref 70–140)
POTASSIUM: 4.4 meq/L (ref 3.5–5.1)
Sodium: 137 mEq/L (ref 136–145)
Total Bilirubin: 0.81 mg/dL (ref 0.20–1.20)
Total Protein: 6.6 g/dL (ref 6.4–8.3)

## 2014-08-01 LAB — CBC WITH DIFFERENTIAL/PLATELET
BASO%: 0.5 % (ref 0.0–2.0)
Basophils Absolute: 0 10*3/uL (ref 0.0–0.1)
EOS%: 0.2 % (ref 0.0–7.0)
Eosinophils Absolute: 0 10*3/uL (ref 0.0–0.5)
HCT: 29.3 % — ABNORMAL LOW (ref 38.4–49.9)
HGB: 10.1 g/dL — ABNORMAL LOW (ref 13.0–17.1)
LYMPH#: 0.5 10*3/uL — AB (ref 0.9–3.3)
LYMPH%: 7 % — ABNORMAL LOW (ref 14.0–49.0)
MCH: 34.3 pg — AB (ref 27.2–33.4)
MCHC: 34.6 g/dL (ref 32.0–36.0)
MCV: 99.1 fL — ABNORMAL HIGH (ref 79.3–98.0)
MONO#: 0.8 10*3/uL (ref 0.1–0.9)
MONO%: 11.8 % (ref 0.0–14.0)
NEUT#: 5.3 10*3/uL (ref 1.5–6.5)
NEUT%: 80.5 % — ABNORMAL HIGH (ref 39.0–75.0)
Platelets: 314 10*3/uL (ref 140–400)
RBC: 2.95 10*6/uL — ABNORMAL LOW (ref 4.20–5.82)
RDW: 17 % — AB (ref 11.0–14.6)
WBC: 6.6 10*3/uL (ref 4.0–10.3)

## 2014-08-01 LAB — TECHNOLOGIST REVIEW: Technologist Review: 2

## 2014-08-01 MED ORDER — SODIUM CHLORIDE 0.9 % IV SOLN
800.0000 mg/m2 | Freq: Once | INTRAVENOUS | Status: AC
  Administered 2014-08-01: 1900 mg via INTRAVENOUS
  Filled 2014-08-01: qty 49.97

## 2014-08-01 MED ORDER — PROCHLORPERAZINE MALEATE 10 MG PO TABS
10.0000 mg | ORAL_TABLET | Freq: Once | ORAL | Status: AC
  Administered 2014-08-01: 10 mg via ORAL

## 2014-08-01 MED ORDER — SODIUM CHLORIDE 0.9 % IJ SOLN
10.0000 mL | INTRAMUSCULAR | Status: DC | PRN
  Administered 2014-08-01: 10 mL
  Filled 2014-08-01: qty 10

## 2014-08-01 MED ORDER — ONDANSETRON 8 MG/50ML IVPB (CHCC): 8.0000 mg | Freq: Every day | INTRAVENOUS | Status: DC | PRN

## 2014-08-01 MED ORDER — SODIUM CHLORIDE 0.9 % IV SOLN: INTRAVENOUS | Status: DC

## 2014-08-01 MED ORDER — SODIUM CHLORIDE 0.9 % IV SOLN
Freq: Once | INTRAVENOUS | Status: AC
  Administered 2014-08-01: 11:00:00 via INTRAVENOUS

## 2014-08-01 MED ORDER — PROCHLORPERAZINE MALEATE 10 MG PO TABS
ORAL_TABLET | ORAL | Status: AC
  Filled 2014-08-01: qty 1

## 2014-08-01 MED ORDER — HEPARIN SOD (PORK) LOCK FLUSH 100 UNIT/ML IV SOLN
500.0000 [IU] | Freq: Once | INTRAVENOUS | Status: AC | PRN
  Administered 2014-08-01: 500 [IU]
  Filled 2014-08-01: qty 5

## 2014-08-01 MED ORDER — SODIUM CHLORIDE 0.9 % IJ SOLN
10.0000 mL | INTRAMUSCULAR | Status: DC | PRN
  Administered 2014-08-01: 10 mL via INTRAVENOUS
  Filled 2014-08-01: qty 10

## 2014-08-01 NOTE — Assessment & Plan Note (Addendum)
The patient has been in remission for the past year. He will continue taking his chemotherapy through all this treatment

## 2014-08-01 NOTE — Progress Notes (Signed)
Wife stopped by in nursing  stated "husband has been having dry heaves morning and night , temperature read 96.9, low 1st time for that, no appetite, nothing since yesterday, had called and asked MD to see him after rad tx,patient has labs and chemotherapy today, i went to the back and took orhostatic vitals, T=98.6, sitting b/p=129/66,P=102,RR=20 Standing B/p=129/69,P=108, patient stated no nausea now, wife gave nausea medication this am, no dry heaves at the present, spokwe with the wife informed her of vitals,a dn will discuss with Dr.moody,but he in consultation at present, sent them for lab and med onc appt 9:56 AM

## 2014-08-01 NOTE — Assessment & Plan Note (Signed)
This is likely due to recent treatment. The patient denies recent history of bleeding such as epistaxis, hematuria or hematochezia. [name] I will observe for now.  He does not require transfusion now. I will continue the chemotherapy at current dose without dosage adjustment.  If the anemia gets progressive worse in the future, I might have to delay his treatment or adjust the chemotherapy dose.  

## 2014-08-01 NOTE — Progress Notes (Signed)
[facility] OFFICE PROGRESS NOTE  Patient Care Team: [name], MD as PCP - General (Internal Medicine) [name], MD as Referring Physician (Specialist) Provider Not In System [name], MD as Consulting Physician (Hematology and Oncology)  SUMMARY OF ONCOLOGIC HISTORY: Oncology History   Pancreatic cancer   Primary site: Pancreas   Staging method: AJCC 7th Edition   Clinical: Stage IIA (T3, N0, M0) signed by [name], MD on 06/13/2014  4:37 PM   Summary: Stage IIA (T3, N0, M0)       Malignant neoplasm of head of pancreas   06/05/2014 Imaging  MRI abdomen showed 2.2 cm lesion in the pancreatic head obstructing the common bile duct and the pancreatic duct is highly concerning for pancreatic adenocarcinoma.  There is moderate intra and extrahepatic biliary duct dilatation.    06/05/2014 Tumor Marker CA 19-9 is elevated at 1234   06/06/2014 Imaging Ct scan of chest showed 2 lung nodules, likely benign   06/07/2014 Procedure He had ERCP and stent placement   06/07/2014 Procedure He had endoscopic ultrasound with  fine needle aspiration.   06/07/2014 Pathology Results Accession: [id number] FNA is positive for pancreatic adenoca   06/14/2014 Surgery He has port placement   06/25/2014 -  Chemotherapy He received neoadjuvant chemotherapy with Gemzar   06/25/2014 -  Radiation Therapy he received neoadjuvant radiation    INTERVAL HISTORY: Please see below for problem oriented charting. He seemed prior to final treatment of chemotherapy today. Radiation will be completed tomorrow. He has very poor oral intake and grossly dehydrated. He complained of fatigue. He is constipated. Had nausea but no vomiting. REVIEW OF SYSTEMS:   Constitutional: Denies fevers, chills or abnormal weight loss Eyes: Denies blurriness of vision Ears, nose, mouth, throat, and face: Denies mucositis or sore throat Respiratory: Denies cough, dyspnea or wheezes Cardiovascular: Denies  palpitation, chest discomfort or lower extremity swelling Her Skin: Denies abnormal skin rashes Lymphatics: Denies new lymphadenopathy or easy bruising Neurological:Denies numbness, tingling or new weaknesses Behavioral/Psych: Mood is stable, no new changes  All other systems were reviewed with the patient and are negative.  I have reviewed the past medical history, past surgical history, social history and family history with the patient and they are unchanged from previous note.  ALLERGIES:  is allergic to ace inhibitors and ciprofloxacin.  MEDICATIONS:  Current Outpatient Prescriptions  Medication Sig Dispense Refill  . acetaminophen (TYLENOL) 325 MG tablet Take 650 mg by mouth every 6 (six) hours as needed for moderate pain.      Marland Kitchen Alum & Mag Hydroxide-Simeth (MAGIC MOUTHWASH W/LIDOCAINE) SOLN Take 5 mLs by mouth 4 (four) times daily.  240 mL  3  . amoxicillin-clavulanate (AUGMENTIN) 875-125 MG per tablet Take 1 tablet by mouth 2 (two) times daily.  14 tablet  0  . aspirin EC 81 MG tablet Take 81 mg by mouth daily.      . B Complex-C (SUPER B COMPLEX/VITAMIN C PO) Take 1 tablet by mouth daily.       . cholecalciferol (VITAMIN D) 1000 UNITS tablet Take 1,000 Units by mouth daily.      . dasatinib (SPRYCEL) 50 MG tablet Take 50 mg by mouth at bedtime.      . diphenhydrAMINE (BENADRYL) 25 mg capsule Take 1 capsule (25 mg total) by mouth every 6 (six) hours as needed for itching.  30 capsule  0  . hyaluronate sodium (RADIAPLEXRX) GEL Apply 1 application topically daily. Apply to affected treated area  daily and prn after rad txs      . hydrocortisone cream 1 % Apply 1 application topically 2 (two) times daily as needed for itching (Rash).      Marland Kitchen HYDROmorphone (DILAUDID) 4 MG tablet Take 1 tablet (4 mg total) by mouth every 4 (four) hours as needed for severe pain.  90 tablet  0  . insulin glargine (LANTUS) 100 UNIT/ML injection Inject 42 Units into the skin at bedtime.       Marland Kitchen lactulose  (CHRONULAC) 10 GM/15ML solution Take 15 mLs by mouth as needed. Hasn't used as yet,      . lidocaine-prilocaine (EMLA) cream Apply 1 application topically as needed.  30 g  6  . losartan (COZAAR) 100 MG tablet Take 100 mg by mouth every morning.       . metFORMIN (GLUCOPHAGE) 1000 MG tablet Take 1,000 mg by mouth 2 (two) times daily with a meal.      . morphine (MS CONTIN) 30 MG 12 hr tablet Take 1 tablet (30 mg total) by mouth every 12 (twelve) hours.  30 tablet  0  . ondansetron (ZOFRAN) 8 MG tablet Take 1 tablet (8 mg total) by mouth every 8 (eight) hours as needed for nausea.  20 tablet  2  . saxagliptin HCl (ONGLYZA) 5 MG TABS tablet Take 5 mg by mouth daily.      Marland Kitchen senna (SENOKOT) 8.6 MG tablet Take 2 tablets by mouth daily.       . simvastatin (ZOCOR) 20 MG tablet Take 20 mg by mouth every evening.      . Skin Protectants, Misc. (EUCERIN) cream Apply 1 application topically as needed for dry skin.      Marland Kitchen vitamin B-12 (CYANOCOBALAMIN) 1000 MCG tablet Take 1,000 mcg by mouth daily.      . promethazine (PHENERGAN) 25 MG tablet Take 1 tablet (25 mg total) by mouth every 6 (six) hours as needed for nausea or vomiting.  60 tablet  3   No current facility-administered medications for this visit.   Facility-Administered Medications Ordered in Other Visits  Medication Dose Route Frequency Provider Last Rate Last Dose  . heparin lock flush 100 unit/mL  500 Units Intravenous Once [name], MD      . sodium chloride 0.9 % injection 10 mL  10 mL Intravenous PRN [name], MD        PHYSICAL EXAMINATION: ECOG PERFORMANCE STATUS: 2 - Symptomatic, <50% confined to bed  Filed Vitals:   08/01/14 1033  BP: 139/69  Pulse: 94  Temp: 98.4 F (36.9 C)  Resp: 17   Filed Weights   08/01/14 1033  Weight: 231 lb 9.6 oz (105.053 kg)    GENERAL:alert, no distress and comfortable. He is morbidly obese SKIN: skin color, texture, turgor are normal, no rashes or significant lesions EYES: normal,  Conjunctiva are pink and non-injected, sclera clear OROPHARYNX:no exudate, no erythema and lips, buccal mucosa, and tongue normal  NECK: supple, thyroid normal size, non-tender, without nodularity LYMPH:  no palpable lymphadenopathy in the cervical, axillary or inguinal LUNGS: clear to auscultation and percussion with normal breathing effort HEART: regular rate & rhythm and no murmurs and no lower extremity edema ABDOMEN:abdomen soft, non-tender and normal bowel sounds Musculoskeletal:no cyanosis of digits and no clubbing  NEURO: alert & oriented x 3 with fluent speech, no focal motor/sensory deficits  LABORATORY DATA:  I have reviewed the data as listed    Component Value Date/Time   NA 137 08/01/2014  1017   NA 137 06/25/2014 1100   K 4.4 08/01/2014 1017   K 4.8 06/25/2014 1100   CL 99 06/25/2014 1100   CL 107 04/27/2013 0926   CO2 24 08/01/2014 1017   CO2 25 06/25/2014 1100   GLUCOSE 135 08/01/2014 1017   GLUCOSE 291* 06/25/2014 1100   GLUCOSE 212* 04/27/2013 0926   BUN 10.2 08/01/2014 1017   BUN 17 06/25/2014 1100   CREATININE 1.2 08/01/2014 1017   CREATININE 1.27 06/25/2014 1100   CALCIUM 9.9 08/01/2014 1017   CALCIUM 10.8* 06/25/2014 1100   PROT 6.6 08/01/2014 1017   PROT 7.2 06/25/2014 1100   ALBUMIN 3.2* 08/01/2014 1017   ALBUMIN 3.7 06/25/2014 1100   AST 27 08/01/2014 1017   AST 22 06/25/2014 1100   ALT 18 08/01/2014 1017   ALT 37 06/25/2014 1100   ALKPHOS 173* 08/01/2014 1017   ALKPHOS 173* 06/25/2014 1100   BILITOT 0.81 08/01/2014 1017   BILITOT 0.8 06/25/2014 1100   GFRNONAA 68* 06/08/2014 0437   GFRAA 79* 06/08/2014 0437    No results found for this basename: SPEP, UPEP,  kappa and lambda light chains    Lab Results  Component Value Date   WBC 6.6 08/01/2014   NEUTROABS 5.3 08/01/2014   HGB 10.1* 08/01/2014   HCT 29.3* 08/01/2014   MCV 99.1* 08/01/2014   PLT 314 08/01/2014      Chemistry      Component Value Date/Time   NA 137 08/01/2014 1017   NA 137 06/25/2014 1100   K 4.4  08/01/2014 1017   K 4.8 06/25/2014 1100   CL 99 06/25/2014 1100   CL 107 04/27/2013 0926   CO2 24 08/01/2014 1017   CO2 25 06/25/2014 1100   BUN 10.2 08/01/2014 1017   BUN 17 06/25/2014 1100   CREATININE 1.2 08/01/2014 1017   CREATININE 1.27 06/25/2014 1100      Component Value Date/Time   CALCIUM 9.9 08/01/2014 1017   CALCIUM 10.8* 06/25/2014 1100   ALKPHOS 173* 08/01/2014 1017   ALKPHOS 173* 06/25/2014 1100   AST 27 08/01/2014 1017   AST 22 06/25/2014 1100   ALT 18 08/01/2014 1017   ALT 37 06/25/2014 1100   BILITOT 0.81 08/01/2014 1017   BILITOT 0.8 06/25/2014 1100     ASSESSMENT & PLAN:  Malignant neoplasm of head of pancreas He is starting to experience more side effects from treatment. I will proceed with treatment today without dose adjustment.   CKD stage 3 due to type 2 diabetes mellitus His creatinine is grossly elevated to 2 dehydration. I encouraged him to drink more fluids. I will add IV fluids daily through the rest of the week.    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 He will continue taking his chemotherapy through all this treatment  Anemia in neoplastic disease This is likely due to recent treatment. The patient denies recent history of bleeding such as epistaxis, hematuria or hematochezia. [name] I will observe for now.  He does not require transfusion now. I will continue the chemotherapy at current dose without dosage adjustment.  If the anemia gets progressive worse in the future, I might have to delay his treatment or adjust the chemotherapy dose.    Nausea alone I suspect that might be an element of dehydration. I recommend daily IV fluids and IV anti emetics as needed  Constipated This is related to pain medicine. I recommend  he takes laxatives regularly.   No orders of the defined types were placed in this encounter.   All questions were answered. The patient knows to call the clinic with any  problems, questions or concerns. No barriers to learning was detected. I spent 30 minutes counseling the patient face to face. The total time spent in the appointment was 40 minutes and more than 50% was on counseling and review of test results     [facility], [place], MD 08/01/2014 11:00 AM

## 2014-08-01 NOTE — Assessment & Plan Note (Signed)
I suspect that might be an element of dehydration. I recommend daily IV fluids and IV anti emetics as needed

## 2014-08-01 NOTE — Telephone Encounter (Signed)
Pt confirmed labs/ov per 09/16 POF, sent msg to add IVF, gave pt AVS....KJ

## 2014-08-01 NOTE — Patient Instructions (Signed)

## 2014-08-01 NOTE — Assessment & Plan Note (Signed)
This is related to pain medicine. I recommend he takes laxatives regularly.

## 2014-08-01 NOTE — Patient Instructions (Signed)
[facility] Discharge Instructions for Patients Receiving Chemotherapy  Today you received the following chemotherapy agents gemzar.   To help prevent nausea and vomiting after your treatment, we encourage you to take your nausea medication as directed.  If you develop nausea and vomiting that is not controlled by your nausea medication, call the clinic.   BELOW ARE SYMPTOMS THAT SHOULD BE REPORTED IMMEDIATELY:  *FEVER GREATER THAN 100.5 F  *CHILLS WITH OR WITHOUT FEVER  NAUSEA AND VOMITING THAT IS NOT CONTROLLED WITH YOUR NAUSEA MEDICATION  *UNUSUAL SHORTNESS OF BREATH  *UNUSUAL BRUISING OR BLEEDING  TENDERNESS IN MOUTH AND THROAT WITH OR WITHOUT PRESENCE OF ULCERS  *URINARY PROBLEMS  *BOWEL PROBLEMS  UNUSUAL RASH Items with * indicate a potential emergency and should be followed up as soon as possible.  Feel free to call the clinic you have any questions or concerns. The clinic phone number is [phone].   Dehydration, Adult Dehydration is when you lose more fluids from the body than you take in. Vital organs like the kidneys, brain, and heart cannot function without a proper amount of fluids and salt. Any loss of fluids from the body can cause dehydration.  CAUSES   Vomiting.  Diarrhea.  Excessive sweating.  Excessive urine output.  Fever. SYMPTOMS  Mild dehydration  Thirst.  Dry lips.  Slightly dry mouth. Moderate dehydration  Very dry mouth.  Sunken eyes.  Skin does not bounce back quickly when lightly pinched and released.  Dark urine and decreased urine production.  Decreased tear production.  Headache. Severe dehydration  Very dry mouth.  Extreme thirst.  Rapid, weak pulse (more than 100 beats per minute at rest).  Cold hands and feet.  Not able to sweat in spite of heat and temperature.  Rapid breathing.  Blue lips.  Confusion and lethargy.  Difficulty being awakened.  Minimal urine production.  No  tears. DIAGNOSIS  Your caregiver will diagnose dehydration based on your symptoms and your exam. Blood and urine tests will help confirm the diagnosis. The diagnostic evaluation should also identify the cause of dehydration. TREATMENT  Treatment of mild or moderate dehydration can often be done at home by increasing the amount of fluids that you drink. It is best to drink small amounts of fluid more often. Drinking too much at one time can make vomiting worse. Refer to the home care instructions below. Severe dehydration needs to be treated at the hospital where you will probably be given intravenous (IV) fluids that contain water and electrolytes. HOME CARE INSTRUCTIONS   Ask your caregiver about specific rehydration instructions.  Drink enough fluids to keep your urine clear or pale yellow.  Drink small amounts frequently if you have nausea and vomiting.  Eat as you normally do.  Avoid:  Foods or drinks high in sugar.  Carbonated drinks.  Juice.  Extremely hot or cold fluids.  Drinks with caffeine.  Fatty, greasy foods.  Alcohol.  Tobacco.  Overeating.  Gelatin desserts.  Wash your hands well to avoid spreading bacteria and viruses.  Only take over-the-counter or prescription medicines for pain, discomfort, or fever as directed by your caregiver.  Ask your caregiver if you should continue all prescribed and over-the-counter medicines.  Keep all follow-up appointments with your caregiver. SEEK MEDICAL CARE IF:  You have abdominal pain and it increases or stays in one area (localizes).  You have a rash, stiff neck, or severe headache.  You are irritable, sleepy, or difficult to awaken.  You  are weak, dizzy, or extremely thirsty. SEEK IMMEDIATE MEDICAL CARE IF:   You are unable to keep fluids down or you get worse despite treatment.  You have frequent episodes of vomiting or diarrhea.  You have blood or green matter (bile) in your vomit.  You have blood in  your stool or your stool looks black and tarry.  You have not urinated in 6 to 8 hours, or you have only urinated a small amount of very dark urine.  You have a fever.  You faint. MAKE SURE YOU:   Understand these instructions.  Will watch your condition.  Will get help right away if you are not doing well or get worse. Document Released: 11/02/2005 Document Revised: 01/25/2012 Document Reviewed: 06/22/2011 [facility] Patient Information 2015 [place], [place]. This information is not intended to replace advice given to you by your health care provider. Make sure you discuss any questions you have with your health care provider.

## 2014-08-01 NOTE — Telephone Encounter (Signed)
Per staff message and POF I have scheduled appts. Advised scheduler of appts. JMW  

## 2014-08-01 NOTE — Assessment & Plan Note (Signed)
His creatinine is grossly elevated to 2 dehydration. I encouraged him to drink more fluids. I will add IV fluids daily through the rest of the week.

## 2014-08-01 NOTE — Progress Notes (Signed)
Followup completed with patient.  Wife was not present.  Difficult to obtain any information from patient.  Patient complains of nausea and dry heaves last night and this morning.  Reports he has not had anything to eat.  Currently patient denies nausea.  Weight documented as 231 pounds September 16 decreased from 235.3 pounds September 10.  Nutrition diagnosis: Food and nutrition related knowledge deficit continues.  Intervention: Educated patient on the importance of weight stability. Educated patient on ways to increase oral intake. Recommended patient comply with nausea medications as written on prescription. Patient to continue oral nutrition supplements.  Monitoring, evaluation, goals: Patient will tolerate adequate calories and protein along with oral nutrition supplements to promote weight stability.  Next visit: Not scheduled at this time.  I will follow patient as needed.  He has my contact information for questions or concerns.   **Disclaimer: This note was dictated with voice recognition software. Similar sounding words can inadvertently be transcribed and this note may contain transcription errors which may not have been corrected upon publication of note.**

## 2014-08-01 NOTE — Assessment & Plan Note (Signed)
He is starting to experience more side effects from treatment. I will proceed with treatment today without dose adjustment.

## 2014-08-02 ENCOUNTER — Ambulatory Visit: Payer: Medicare Other

## 2014-08-02 ENCOUNTER — Telehealth: Payer: Self-pay | Admitting: *Deleted

## 2014-08-02 ENCOUNTER — Telehealth: Payer: Self-pay | Admitting: Nurse Practitioner

## 2014-08-02 NOTE — Telephone Encounter (Signed)
Wife reports pt constipated, no BM x 4 days.  He took 2 Senokot-s and Lactulose last night.  He took 2 more Senokot-s,  Lactulose and a suppository this morning.  He does not want to come into clinic until he has a BM.  She plans to repeat the Senokot-S and Lactulose at noon if no BM.   She is pushing oral fluids states pt drank 24 oz water last night and 16 oz this morning.  Her goal is to get pt to drink close to 2 liters today.  States pt not having any trouble w/ drinking fluids but is dry heaving and not able to eat.  He says he feels like he will be able to eat after he has BM.  He definitely does not want to leave home until he has a BM today.  Asked wife to please call early afternoon.  We can still get pt in for IVFs this afternoon if he can come.  The fluids have already improved his kidney function.  We don't want pt to end up in ED.  She verbalized understanding.

## 2014-08-02 NOTE — Telephone Encounter (Signed)
Patient's wife, [name], called infusion room today at [phone] to cancel patient's IVF appt for today at 0830. She states my husband is "sick." Upon further investigation, [name] relates to RN that husband has not had a BM and it's making his "throw up." Then she states more like "dry heaves." This RN told patient's wife this IVF appointment will help him. Patient received gemzar and 1 L IVF yesterday 08/01/14. He went home and took total of 4 senna S throughout the afternoon and night on 08/01/14 as well as 1 dose lactulose. Still no BM in 4 days per [facility]. Patient has been taking nausea meds prn and this helps symptoms. RN asked patient's wife if Mr. Nhan has been eating, she states nothing to eat in 2 days. RN informed wife this may be the cause of lack of bowel movement. RN encouraged pt's wife to have him eat and drink. Beverly verbalizes understanding. Rn encouraged patient's wife to call later today if he has BM and feels like coming in for IVF. She states agreement. Cameo, RN to Dr. Alvy Bimler notified at [phone].

## 2014-08-03 ENCOUNTER — Ambulatory Visit: Payer: Medicare Other

## 2014-08-03 ENCOUNTER — Telehealth: Payer: Self-pay | Admitting: *Deleted

## 2014-08-03 ENCOUNTER — Ambulatory Visit (HOSPITAL_BASED_OUTPATIENT_CLINIC_OR_DEPARTMENT_OTHER): Payer: Medicare Other

## 2014-08-03 VITALS — BP 130/66 | HR 80 | Temp 98.6°F | Resp 18

## 2014-08-03 DIAGNOSIS — E860 Dehydration: Secondary | ICD-10-CM

## 2014-08-03 DIAGNOSIS — R110 Nausea: Secondary | ICD-10-CM

## 2014-08-03 DIAGNOSIS — K1231 Oral mucositis (ulcerative) due to antineoplastic therapy: Secondary | ICD-10-CM

## 2014-08-03 DIAGNOSIS — C250 Malignant neoplasm of head of pancreas: Secondary | ICD-10-CM

## 2014-08-03 MED ORDER — HEPARIN SOD (PORK) LOCK FLUSH 100 UNIT/ML IV SOLN
500.0000 [IU] | Freq: Once | INTRAVENOUS | Status: AC | PRN
  Administered 2014-08-03: 500 [IU]
  Filled 2014-08-03: qty 5

## 2014-08-03 MED ORDER — SODIUM CHLORIDE 0.9 % IV SOLN
1000.0000 mL | Freq: Once | INTRAVENOUS | Status: AC
  Administered 2014-08-03: 1000 mL via INTRAVENOUS

## 2014-08-03 MED ORDER — SODIUM CHLORIDE 0.9 % IJ SOLN
10.0000 mL | INTRAMUSCULAR | Status: DC | PRN
  Administered 2014-08-03: 10 mL
  Filled 2014-08-03: qty 10

## 2014-08-03 MED ORDER — ONDANSETRON 8 MG/50ML IVPB (CHCC)
8.0000 mg | Freq: Every day | INTRAVENOUS | Status: DC | PRN
  Administered 2014-08-03: 8 mg via INTRAVENOUS

## 2014-08-03 MED ORDER — ONDANSETRON 8 MG/NS 50 ML IVPB
INTRAVENOUS | Status: AC
  Filled 2014-08-03: qty 8

## 2014-08-03 NOTE — Patient Instructions (Signed)
Dehydration Dehydration means your body does not have as much fluid as it needs. Your kidneys, brain, and heart will not work properly without the right amount of fluids and salt. Older adults are more likely to become dehydrated than younger adults. This is because:   Their bodies do not hold water as well.  Their bodies do not respond to temperature changes as well.  They do not get thirsty as easily or as quickly. HOME CARE  Ask your doctor how to replace body fluid losses (rehydrate).  Drink enough fluids to keep your pee (urine) clear or pale yellow.  Drink small amounts of fluids often if you feel sick to your stomach (nauseous) or throw up (vomit).  Eat like you normally do.  Avoid:  Foods or drinks high in sugar.  Bubbly (carbonated) drinks.  Juice.  Very hot or cold fluids.  Drinks with caffeine.  Fatty, greasy foods.  Alcohol.  Tobacco.  Eating too much.  Gelatin desserts.  Wash your hands to avoid spreading germs (bacteria, viruses).  Only take medicine as told by your doctor.  Keep all doctor visits as told. GET HELP IF:  You have belly (abdominal) pain that gets worse or stays in one spot (localizes).  You have a rash, stiff neck, or bad headache.  You get easily annoyed, sleepy, or are hard to wake up.  You feel weak, dizzy, or very thirsty.  You have a fever. GET HELP RIGHT AWAY IF:   You cannot drink fluid without throwing up.  You get worse even with treatment.  You throw up often.  You have watery poop (diarrhea) often.  Your vomit has blood in it or looks greenish.  Your poop (stool) has blood in it or looks black and tarry.  You have not peed in 6 to 8 hours or have only peed a small amount of very dark pee.  You pass out (faint). MAKE SURE YOU:   Understand these instructions.  Will watch your condition.  Will get help right away if you are not doing well or get worse. Document Released: 10/22/2011 Document Revised:  11/07/2013 Document Reviewed: 07/10/2013 [employer] Patient Information 2015 [employer]. This information is not intended to replace advice given to you by your health care provider. Make sure you discuss any questions you have with your health care provider.  

## 2014-08-03 NOTE — Telephone Encounter (Signed)
Wife reports pt doing better today.  He finally had good BM yesterday afternoon and several overnight.  Discussed pt taking stool softeners twice daily to prevent constipation in the future.  Wife verbalized understanding. She is bringing him in this afternoon and tomorrow morning for IVFs as scheduled.   Pt has lab and sees Dr. Alvy Bimler next week on Tuesday 9/22.

## 2014-08-04 ENCOUNTER — Ambulatory Visit (HOSPITAL_BASED_OUTPATIENT_CLINIC_OR_DEPARTMENT_OTHER): Payer: Medicare Other

## 2014-08-04 VITALS — BP 127/63 | HR 90 | Temp 98.2°F | Resp 17

## 2014-08-04 DIAGNOSIS — E860 Dehydration: Secondary | ICD-10-CM

## 2014-08-04 DIAGNOSIS — C250 Malignant neoplasm of head of pancreas: Secondary | ICD-10-CM

## 2014-08-04 MED ORDER — SODIUM CHLORIDE 0.9 % IV SOLN
Freq: Once | INTRAVENOUS | Status: AC
  Administered 2014-08-04: 08:00:00 via INTRAVENOUS

## 2014-08-04 NOTE — Patient Instructions (Signed)
Dehydration, Adult Dehydration is when you lose more fluids from the body than you take in. Vital organs like the kidneys, brain, and heart cannot function without a proper amount of fluids and salt. Any loss of fluids from the body can cause dehydration.  CAUSES   Vomiting.  Diarrhea.  Excessive sweating.  Excessive urine output.  Fever. SYMPTOMS  Mild dehydration  Thirst.  Dry lips.  Slightly dry mouth. Moderate dehydration  Very dry mouth.  Sunken eyes.  Skin does not bounce back quickly when lightly pinched and released.  Dark urine and decreased urine production.  Decreased tear production.  Headache. Severe dehydration  Very dry mouth.  Extreme thirst.  Rapid, weak pulse (more than 100 beats per minute at rest).  Cold hands and feet.  Not able to sweat in spite of heat and temperature.  Rapid breathing.  Blue lips.  Confusion and lethargy.  Difficulty being awakened.  Minimal urine production.  No tears. DIAGNOSIS  Your caregiver will diagnose dehydration based on your symptoms and your exam. Blood and urine tests will help confirm the diagnosis. The diagnostic evaluation should also identify the cause of dehydration. TREATMENT  Treatment of mild or moderate dehydration can often be done at home by increasing the amount of fluids that you drink. It is best to drink small amounts of fluid more often. Drinking too much at one time can make vomiting worse. Refer to the home care instructions below. Severe dehydration needs to be treated at the hospital where you will probably be given intravenous (IV) fluids that contain water and electrolytes. HOME CARE INSTRUCTIONS   Ask your caregiver about specific rehydration instructions.  Drink enough fluids to keep your urine clear or pale yellow.  Drink small amounts frequently if you have nausea and vomiting.  Eat as you normally do.  Avoid:  Foods or drinks high in sugar.  Carbonated  drinks.  Juice.  Extremely hot or cold fluids.  Drinks with caffeine.  Fatty, greasy foods.  Alcohol.  Tobacco.  Overeating.  Gelatin desserts.  Wash your hands well to avoid spreading bacteria and viruses.  Only take over-the-counter or prescription medicines for pain, discomfort, or fever as directed by your caregiver.  Ask your caregiver if you should continue all prescribed and over-the-counter medicines.  Keep all follow-up appointments with your caregiver. SEEK MEDICAL CARE IF:  You have abdominal pain and it increases or stays in one area (localizes).  You have a rash, stiff neck, or severe headache.  You are irritable, sleepy, or difficult to awaken.  You are weak, dizzy, or extremely thirsty. SEEK IMMEDIATE MEDICAL CARE IF:   You are unable to keep fluids down or you get worse despite treatment.  You have frequent episodes of vomiting or diarrhea.  You have blood or green matter (bile) in your vomit.  You have blood in your stool or your stool looks black and tarry.  You have not urinated in 6 to 8 hours, or you have only urinated a small amount of very dark urine.  You have a fever.  You faint. MAKE SURE YOU:   Understand these instructions.  Will watch your condition.  Will get help right away if you are not doing well or get worse. Document Released: 11/02/2005 Document Revised: 01/25/2012 Document Reviewed: 06/22/2011 [employer] Patient Information 2015 [employer]. This information is not intended to replace advice given to you by your health care provider. Make sure you discuss any questions you have with your health care   provider.  

## 2014-08-06 ENCOUNTER — Encounter: Payer: Self-pay | Admitting: Hematology and Oncology

## 2014-08-06 ENCOUNTER — Ambulatory Visit: Payer: Medicare Other

## 2014-08-06 ENCOUNTER — Ambulatory Visit (HOSPITAL_BASED_OUTPATIENT_CLINIC_OR_DEPARTMENT_OTHER): Payer: Medicare Other | Admitting: Hematology and Oncology

## 2014-08-06 ENCOUNTER — Other Ambulatory Visit (HOSPITAL_BASED_OUTPATIENT_CLINIC_OR_DEPARTMENT_OTHER): Payer: Medicare Other

## 2014-08-06 ENCOUNTER — Other Ambulatory Visit: Payer: Self-pay | Admitting: *Deleted

## 2014-08-06 ENCOUNTER — Encounter: Payer: Self-pay | Admitting: Radiation Oncology

## 2014-08-06 ENCOUNTER — Ambulatory Visit (HOSPITAL_BASED_OUTPATIENT_CLINIC_OR_DEPARTMENT_OTHER): Payer: Medicare Other

## 2014-08-06 ENCOUNTER — Telehealth: Payer: Self-pay | Admitting: Hematology and Oncology

## 2014-08-06 ENCOUNTER — Ambulatory Visit
Admission: RE | Admit: 2014-08-06 | Discharge: 2014-08-06 | Disposition: A | Discharge status: Home / Self Care | Payer: Medicare Other | Source: Ambulatory Visit | Attending: Radiation Oncology | Admitting: Radiation Oncology

## 2014-08-06 VITALS — BP 126/68 | HR 62 | Temp 98.1°F | Resp 20

## 2014-08-06 VITALS — BP 142/69 | HR 86 | Temp 98.5°F | Resp 18 | Ht 74.0 in | Wt 226.0 lb

## 2014-08-06 DIAGNOSIS — Z95828 Presence of other vascular implants and grafts: Secondary | ICD-10-CM

## 2014-08-06 DIAGNOSIS — R110 Nausea: Secondary | ICD-10-CM

## 2014-08-06 DIAGNOSIS — C9211 Chronic myeloid leukemia, BCR/ABL-positive, in remission: Secondary | ICD-10-CM

## 2014-08-06 DIAGNOSIS — C250 Malignant neoplasm of head of pancreas: Secondary | ICD-10-CM

## 2014-08-06 DIAGNOSIS — T451X5A Adverse effect of antineoplastic and immunosuppressive drugs, initial encounter: Secondary | ICD-10-CM

## 2014-08-06 DIAGNOSIS — Z510 Encounter for antineoplastic radiation therapy: Secondary | ICD-10-CM | POA: Diagnosis not present

## 2014-08-06 DIAGNOSIS — D701 Agranulocytosis secondary to cancer chemotherapy: Secondary | ICD-10-CM

## 2014-08-06 DIAGNOSIS — K1231 Oral mucositis (ulcerative) due to antineoplastic therapy: Secondary | ICD-10-CM

## 2014-08-06 DIAGNOSIS — K5909 Other constipation: Secondary | ICD-10-CM

## 2014-08-06 DIAGNOSIS — D630 Anemia in neoplastic disease: Secondary | ICD-10-CM

## 2014-08-06 DIAGNOSIS — D72819 Decreased white blood cell count, unspecified: Secondary | ICD-10-CM

## 2014-08-06 LAB — COMPREHENSIVE METABOLIC PANEL (CC13)
ALT: 21 U/L (ref 0–55)
ANION GAP: 11 meq/L (ref 3–11)
AST: 25 U/L (ref 5–34)
Albumin: 3 g/dL — ABNORMAL LOW (ref 3.5–5.0)
Alkaline Phosphatase: 179 U/L — ABNORMAL HIGH (ref 40–150)
BUN: 12.9 mg/dL (ref 7.0–26.0)
CALCIUM: 9.2 mg/dL (ref 8.4–10.4)
CHLORIDE: 103 meq/L (ref 98–109)
CO2: 25 meq/L (ref 22–29)
CREATININE: 1 mg/dL (ref 0.7–1.3)
Glucose: 159 mg/dl — ABNORMAL HIGH (ref 70–140)
Potassium: 3.7 mEq/L (ref 3.5–5.1)
Sodium: 138 mEq/L (ref 136–145)
Total Bilirubin: 0.66 mg/dL (ref 0.20–1.20)
Total Protein: 6.4 g/dL (ref 6.4–8.3)

## 2014-08-06 LAB — CBC WITH DIFFERENTIAL/PLATELET
BASO%: 1.4 % (ref 0.0–2.0)
BASOS ABS: 0 10*3/uL (ref 0.0–0.1)
EOS ABS: 0.1 10*3/uL (ref 0.0–0.5)
EOS%: 2.1 % (ref 0.0–7.0)
HEMATOCRIT: 27.6 % — AB (ref 38.4–49.9)
HEMOGLOBIN: 9.5 g/dL — AB (ref 13.0–17.1)
LYMPH%: 11.6 % — AB (ref 14.0–49.0)
MCH: 33.6 pg — ABNORMAL HIGH (ref 27.2–33.4)
MCHC: 34.4 g/dL (ref 32.0–36.0)
MCV: 97.5 fL (ref 79.3–98.0)
MONO#: 0.3 10*3/uL (ref 0.1–0.9)
MONO%: 8.8 % (ref 0.0–14.0)
NEUT%: 76.1 % — ABNORMAL HIGH (ref 39.0–75.0)
NEUTROS ABS: 2.2 10*3/uL (ref 1.5–6.5)
PLATELETS: 195 10*3/uL (ref 140–400)
RBC: 2.83 10*6/uL — AB (ref 4.20–5.82)
RDW: 19.9 % — ABNORMAL HIGH (ref 11.0–14.6)
WBC: 2.8 10*3/uL — ABNORMAL LOW (ref 4.0–10.3)
lymph#: 0.3 10*3/uL — ABNORMAL LOW (ref 0.9–3.3)
nRBC: 0 % (ref 0–0)

## 2014-08-06 LAB — TECHNOLOGIST REVIEW: Technologist Review: 1

## 2014-08-06 MED ORDER — SODIUM CHLORIDE 0.9 % IJ SOLN
10.0000 mL | INTRAMUSCULAR | Status: DC | PRN
  Administered 2014-08-06: 18:00:00
  Filled 2014-08-06: qty 10

## 2014-08-06 MED ORDER — ONDANSETRON 8 MG/50ML IVPB (CHCC): 8.0000 mg | Freq: Every day | INTRAVENOUS | Status: DC | PRN

## 2014-08-06 MED ORDER — HEPARIN SOD (PORK) LOCK FLUSH 100 UNIT/ML IV SOLN
500.0000 [IU] | Freq: Once | INTRAVENOUS | Status: DC | PRN
  Filled 2014-08-06: qty 5

## 2014-08-06 MED ORDER — SODIUM CHLORIDE 0.9 % IV SOLN
Freq: Once | INTRAVENOUS | Status: AC
  Administered 2014-08-06: 16:00:00 via INTRAVENOUS

## 2014-08-06 MED ORDER — SODIUM CHLORIDE 0.9 % IJ SOLN
10.0000 mL | INTRAMUSCULAR | Status: DC | PRN
  Administered 2014-08-06: 10 mL via INTRAVENOUS
  Filled 2014-08-06: qty 10

## 2014-08-06 MED ORDER — HEPARIN SOD (PORK) LOCK FLUSH 100 UNIT/ML IV SOLN
250.0000 [IU] | Freq: Once | INTRAVENOUS | Status: DC | PRN
  Filled 2014-08-06: qty 5

## 2014-08-06 MED ORDER — ALTEPLASE 2 MG IJ SOLR
2.0000 mg | Freq: Once | INTRAMUSCULAR | Status: DC | PRN
Start: 2014-08-06 — End: 2014-08-06
  Filled 2014-08-06: qty 2

## 2014-08-06 NOTE — Patient Instructions (Signed)

## 2014-08-06 NOTE — Assessment & Plan Note (Signed)
I suspect that might be an element of dehydration. I recommend daily IV fluids and IV anti emetics as needed. Recommend aggressive laxatives to prevent constipation as this may contribute to his nausea

## 2014-08-06 NOTE — Progress Notes (Signed)
[facility] OFFICE PROGRESS NOTE  Patient Care Team: [name], MD as PCP - General (Internal Medicine) [name], MD as Referring Physician (Specialist) Provider Not In System [name], MD as Consulting Physician (Hematology and Oncology)  SUMMARY OF ONCOLOGIC HISTORY: Oncology History   Pancreatic cancer   Primary site: Pancreas   Staging method: AJCC 7th Edition   Clinical: Stage IIA (T3, N0, M0) signed by [name], MD on 06/13/2014  4:37 PM   Summary: Stage IIA (T3, N0, M0)       Malignant neoplasm of head of pancreas   06/05/2014 Imaging  MRI abdomen showed 2.2 cm lesion in the pancreatic head obstructing the common bile duct and the pancreatic duct is highly concerning for pancreatic adenocarcinoma.  There is moderate intra and extrahepatic biliary duct dilatation.    06/05/2014 Tumor Marker CA 19-9 is elevated at 1234   06/06/2014 Imaging Ct scan of chest showed 2 lung nodules, likely benign   06/07/2014 Procedure He had ERCP and stent placement   06/07/2014 Procedure He had endoscopic ultrasound with  fine needle aspiration.   06/07/2014 Pathology Results Accession: [id number] FNA is positive for pancreatic adenoca   06/14/2014 Surgery He has port placement   06/25/2014 - 08/01/2014 Chemotherapy He received neoadjuvant chemotherapy with Gemzar   06/25/2014 - 08/06/2014 Radiation Therapy he received neoadjuvant radiation    INTERVAL HISTORY: Please see below for problem oriented charting. He felt terrible last week and refuses to come in for IV fluids. He had some nausea and very poor appetite. He complained of severe epigastric tenderness with nausea, relieved by anti-emetics. He also suffered from constipation.  REVIEW OF SYSTEMS:   Constitutional: Denies fevers, chills or abnormal weight loss Eyes: Denies blurriness of vision Ears, nose, mouth, throat, and face: Denies mucositis or sore throat Respiratory: Denies cough, dyspnea or  wheezes Cardiovascular: Denies palpitation, chest discomfort or lower extremity swelling Skin: Denies abnormal skin rashes Lymphatics: Denies new lymphadenopathy or easy bruising Neurological:Denies numbness, tingling or new weaknesses Behavioral/Psych: Mood is stable, no new changes  All other systems were reviewed with the patient and are negative.  I have reviewed the past medical history, past surgical history, social history and family history with the patient and they are unchanged from previous note.  ALLERGIES:  is allergic to ace inhibitors and ciprofloxacin.  MEDICATIONS:  Current Outpatient Prescriptions  Medication Sig Dispense Refill  . acetaminophen (TYLENOL) 325 MG tablet Take 650 mg by mouth every 6 (six) hours as needed for moderate pain.      Marland Kitchen Alum & Mag Hydroxide-Simeth (MAGIC MOUTHWASH W/LIDOCAINE) SOLN Take 5 mLs by mouth 4 (four) times daily.  240 mL  3  . aspirin EC 81 MG tablet Take 81 mg by mouth daily.      . B Complex-C (SUPER B COMPLEX/VITAMIN C PO) Take 1 tablet by mouth daily.       . cholecalciferol (VITAMIN D) 1000 UNITS tablet Take 1,000 Units by mouth daily.      . dasatinib (SPRYCEL) 50 MG tablet Take 50 mg by mouth at bedtime.      . diphenhydrAMINE (BENADRYL) 25 mg capsule Take 1 capsule (25 mg total) by mouth every 6 (six) hours as needed for itching.  30 capsule  0  . hyaluronate sodium (RADIAPLEXRX) GEL Apply 1 application topically daily. Apply to affected treated area daily and prn after rad txs      . hydrocortisone cream 1 % Apply 1 application topically  2 (two) times daily as needed for itching (Rash).      Marland Kitchen HYDROmorphone (DILAUDID) 4 MG tablet Take 1 tablet (4 mg total) by mouth every 4 (four) hours as needed for severe pain.  90 tablet  0  . insulin glargine (LANTUS) 100 UNIT/ML injection Inject 42 Units into the skin at bedtime.       Marland Kitchen lactulose (CHRONULAC) 10 GM/15ML solution Take 15 mLs by mouth as needed. Hasn't used as yet,      .  lidocaine-prilocaine (EMLA) cream Apply 1 application topically as needed.  30 g  6  . losartan (COZAAR) 100 MG tablet Take 100 mg by mouth every morning.       . metFORMIN (GLUCOPHAGE) 1000 MG tablet Take 1,000 mg by mouth 2 (two) times daily with a meal.      . morphine (MS CONTIN) 30 MG 12 hr tablet Take 1 tablet (30 mg total) by mouth every 12 (twelve) hours.  30 tablet  0  . ondansetron (ZOFRAN) 8 MG tablet Take 1 tablet (8 mg total) by mouth every 8 (eight) hours as needed for nausea.  20 tablet  2  . promethazine (PHENERGAN) 25 MG tablet Take 1 tablet (25 mg total) by mouth every 6 (six) hours as needed for nausea or vomiting.  60 tablet  3  . saxagliptin HCl (ONGLYZA) 5 MG TABS tablet Take 5 mg by mouth daily.      Marland Kitchen senna (SENOKOT) 8.6 MG tablet Take 2 tablets by mouth daily.       . simvastatin (ZOCOR) 20 MG tablet Take 20 mg by mouth every evening.      . Skin Protectants, Misc. (EUCERIN) cream Apply 1 application topically as needed for dry skin.      Marland Kitchen vitamin B-12 (CYANOCOBALAMIN) 1000 MCG tablet Take 1,000 mcg by mouth daily.      Marland Kitchen amoxicillin-clavulanate (AUGMENTIN) 875-125 MG per tablet Take 1 tablet by mouth 2 (two) times daily.  14 tablet  0   No current facility-administered medications for this visit.   Facility-Administered Medications Ordered in Other Visits  Medication Dose Route Frequency Provider Last Rate Last Dose  . heparin lock flush 100 unit/mL  500 Units Intravenous Once [name], MD      . sodium chloride 0.9 % injection 10 mL  10 mL Intravenous PRN [name], MD        PHYSICAL EXAMINATION: ECOG PERFORMANCE STATUS: 1 - Symptomatic but completely ambulatory  Filed Vitals:   08/06/14 1535  BP: 142/69  Pulse: 86  Temp: 98.5 F (36.9 C)  Resp: 18   Filed Weights   08/06/14 1535  Weight: 226 lb (102.513 kg)    GENERAL:alert, no distress and comfortable SKIN: skin color, texture, turgor are normal, no rashes or significant lesions EYES: normal,  Conjunctiva are pink and non-injected, sclera clear OROPHARYNX:no exudate, no erythema and lips, buccal mucosa, and tongue normal  NECK: supple, thyroid normal size, non-tender, without nodularity LYMPH:  no palpable lymphadenopathy in the cervical, axillary or inguinal LUNGS: clear to auscultation and percussion with normal breathing effort HEART: regular rate & rhythm and no murmurs and no lower extremity edema ABDOMEN:abdomen soft, non-tender and normal bowel sounds Musculoskeletal:no cyanosis of digits and no clubbing  NEURO: alert & oriented x 3 with fluent speech, no focal motor/sensory deficits  LABORATORY DATA:  I have reviewed the data as listed    Component Value Date/Time   NA 138 08/06/2014 1515   NA 137  06/25/2014 1100   K 3.7 08/06/2014 1515   K 4.8 06/25/2014 1100   CL 99 06/25/2014 1100   CL 107 04/27/2013 0926   CO2 25 08/06/2014 1515   CO2 25 06/25/2014 1100   GLUCOSE 159* 08/06/2014 1515   GLUCOSE 291* 06/25/2014 1100   GLUCOSE 212* 04/27/2013 0926   BUN 12.9 08/06/2014 1515   BUN 17 06/25/2014 1100   CREATININE 1.0 08/06/2014 1515   CREATININE 1.27 06/25/2014 1100   CALCIUM 9.2 08/06/2014 1515   CALCIUM 10.8* 06/25/2014 1100   PROT 6.4 08/06/2014 1515   PROT 7.2 06/25/2014 1100   ALBUMIN 3.0* 08/06/2014 1515   ALBUMIN 3.7 06/25/2014 1100   AST 25 08/06/2014 1515   AST 22 06/25/2014 1100   ALT 21 08/06/2014 1515   ALT 37 06/25/2014 1100   ALKPHOS 179* 08/06/2014 1515   ALKPHOS 173* 06/25/2014 1100   BILITOT 0.66 08/06/2014 1515   BILITOT 0.8 06/25/2014 1100   GFRNONAA 68* 06/08/2014 0437   GFRAA 79* 06/08/2014 0437    No results found for this basename: SPEP, UPEP,  kappa and lambda light chains    Lab Results  Component Value Date   WBC 2.8* 08/06/2014   NEUTROABS 2.2 08/06/2014   HGB 9.5* 08/06/2014   HCT 27.6* 08/06/2014   MCV 97.5 08/06/2014   PLT 195 08/06/2014      Chemistry      Component Value Date/Time   NA 138 08/06/2014 1515   NA 137 06/25/2014 1100   K 3.7  08/06/2014 1515   K 4.8 06/25/2014 1100   CL 99 06/25/2014 1100   CL 107 04/27/2013 0926   CO2 25 08/06/2014 1515   CO2 25 06/25/2014 1100   BUN 12.9 08/06/2014 1515   BUN 17 06/25/2014 1100   CREATININE 1.0 08/06/2014 1515   CREATININE 1.27 06/25/2014 1100      Component Value Date/Time   CALCIUM 9.2 08/06/2014 1515   CALCIUM 10.8* 06/25/2014 1100   ALKPHOS 179* 08/06/2014 1515   ALKPHOS 173* 06/25/2014 1100   AST 25 08/06/2014 1515   AST 22 06/25/2014 1100   ALT 21 08/06/2014 1515   ALT 37 06/25/2014 1100   BILITOT 0.66 08/06/2014 1515   BILITOT 0.8 06/25/2014 1100      ASSESSMENT & PLAN:  Malignant neoplasm of head of pancreas He is starting to experience more side effects from treatment. He completes his last radiation treatment today. Continue aggressive supportive care.  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 He will continue taking his chemotherapy through all this treatment  Nausea alone I suspect that might be an element of dehydration. I recommend daily IV fluids and IV anti emetics as needed. Recommend aggressive laxatives to prevent constipation as this may contribute to his nausea    Anemia in neoplastic disease This is likely due to recent treatment. The patient denies recent history of bleeding such as epistaxis, hematuria or hematochezia. [name] I will observe for now.    Constipated This is related to pain medicine. I recommend he takes laxatives regularly.    Leukopenia due to antineoplastic chemotherapy This is likely due to recent treatment. The patient denies recent history of fevers, cough, chills, diarrhea or dysuria. He is asymptomatic from the leukopenia. I will observe for now.          No orders of the defined types were placed in this encounter.   All questions were  answered. The patient knows to call the clinic with any problems, questions or concerns. No barriers to learning was detected. I  spent 30 minutes counseling the patient face to face. The total time spent in the appointment was 40 minutes and more than 50% was on counseling and review of test results     [facility], [place], MD 08/06/2014 9:42 PM

## 2014-08-06 NOTE — Assessment & Plan Note (Signed)
This is likely due to recent treatment. The patient denies recent history of bleeding such as epistaxis, hematuria or hematochezia. [name] I will observe for now.    

## 2014-08-06 NOTE — Assessment & Plan Note (Signed)
The patient has been in remission for the past year. He will continue taking his chemotherapy through all this treatment

## 2014-08-06 NOTE — Telephone Encounter (Signed)
gv adn printed appt sched adn avs for pt for Sept ...sed added tx.

## 2014-08-06 NOTE — Assessment & Plan Note (Signed)
This is likely due to recent treatment. The patient denies recent history of fevers, cough, chills, diarrhea or dysuria. He is asymptomatic from the leukopenia. I will observe for now.  

## 2014-08-06 NOTE — Assessment & Plan Note (Signed)
He is starting to experience more side effects from treatment. He completes his last radiation treatment today. Continue aggressive supportive care.

## 2014-08-06 NOTE — Assessment & Plan Note (Signed)
This is related to pain medicine. I recommend he takes laxatives regularly.

## 2014-08-06 NOTE — Patient Instructions (Signed)

## 2014-08-07 ENCOUNTER — Ambulatory Visit: Payer: Medicare Other

## 2014-08-07 ENCOUNTER — Encounter: Payer: Self-pay | Admitting: Specialist

## 2014-08-07 ENCOUNTER — Inpatient Hospital Stay (HOSPITAL_COMMUNITY)
Admission: EM | Admit: 2014-08-07 | Discharge: 2014-08-09 | DRG: 640 | Disposition: A | Discharge status: Home Health | Payer: Medicare Other | Attending: Internal Medicine | Admitting: Internal Medicine

## 2014-08-07 ENCOUNTER — Other Ambulatory Visit: Payer: Medicare Other

## 2014-08-07 ENCOUNTER — Encounter (HOSPITAL_COMMUNITY): Payer: Self-pay | Admitting: Emergency Medicine

## 2014-08-07 ENCOUNTER — Ambulatory Visit: Payer: Medicare Other | Admitting: Hematology and Oncology

## 2014-08-07 ENCOUNTER — Telehealth: Payer: Self-pay | Admitting: *Deleted

## 2014-08-07 DIAGNOSIS — E860 Dehydration: Secondary | ICD-10-CM | POA: Diagnosis present

## 2014-08-07 DIAGNOSIS — D6481 Anemia due to antineoplastic chemotherapy: Secondary | ICD-10-CM | POA: Diagnosis present

## 2014-08-07 DIAGNOSIS — E1149 Type 2 diabetes mellitus with other diabetic neurological complication: Secondary | ICD-10-CM | POA: Diagnosis present

## 2014-08-07 DIAGNOSIS — E1142 Type 2 diabetes mellitus with diabetic polyneuropathy: Secondary | ICD-10-CM | POA: Diagnosis present

## 2014-08-07 DIAGNOSIS — R531 Weakness: Secondary | ICD-10-CM

## 2014-08-07 DIAGNOSIS — G4733 Obstructive sleep apnea (adult) (pediatric): Secondary | ICD-10-CM | POA: Diagnosis present

## 2014-08-07 DIAGNOSIS — K219 Gastro-esophageal reflux disease without esophagitis: Secondary | ICD-10-CM | POA: Diagnosis present

## 2014-08-07 DIAGNOSIS — E11649 Type 2 diabetes mellitus with hypoglycemia without coma: Secondary | ICD-10-CM

## 2014-08-07 DIAGNOSIS — Z79899 Other long term (current) drug therapy: Secondary | ICD-10-CM

## 2014-08-07 DIAGNOSIS — Z923 Personal history of irradiation: Secondary | ICD-10-CM | POA: Diagnosis not present

## 2014-08-07 DIAGNOSIS — C250 Malignant neoplasm of head of pancreas: Secondary | ICD-10-CM | POA: Diagnosis present

## 2014-08-07 DIAGNOSIS — Z85828 Personal history of other malignant neoplasm of skin: Secondary | ICD-10-CM | POA: Diagnosis not present

## 2014-08-07 DIAGNOSIS — T451X5A Adverse effect of antineoplastic and immunosuppressive drugs, initial encounter: Secondary | ICD-10-CM | POA: Diagnosis present

## 2014-08-07 DIAGNOSIS — R5383 Other fatigue: Secondary | ICD-10-CM | POA: Diagnosis not present

## 2014-08-07 DIAGNOSIS — F411 Generalized anxiety disorder: Secondary | ICD-10-CM | POA: Diagnosis present

## 2014-08-07 DIAGNOSIS — G8929 Other chronic pain: Secondary | ICD-10-CM | POA: Diagnosis present

## 2014-08-07 DIAGNOSIS — C9211 Chronic myeloid leukemia, BCR/ABL-positive, in remission: Secondary | ICD-10-CM | POA: Diagnosis present

## 2014-08-07 DIAGNOSIS — Z7982 Long term (current) use of aspirin: Secondary | ICD-10-CM

## 2014-08-07 DIAGNOSIS — Z9221 Personal history of antineoplastic chemotherapy: Secondary | ICD-10-CM | POA: Diagnosis not present

## 2014-08-07 DIAGNOSIS — E43 Unspecified severe protein-calorie malnutrition: Secondary | ICD-10-CM | POA: Diagnosis present

## 2014-08-07 DIAGNOSIS — Z87891 Personal history of nicotine dependence: Secondary | ICD-10-CM

## 2014-08-07 DIAGNOSIS — Z794 Long term (current) use of insulin: Secondary | ICD-10-CM

## 2014-08-07 DIAGNOSIS — E1169 Type 2 diabetes mellitus with other specified complication: Secondary | ICD-10-CM | POA: Diagnosis present

## 2014-08-07 DIAGNOSIS — Z6829 Body mass index (BMI) 29.0-29.9, adult: Secondary | ICD-10-CM

## 2014-08-07 DIAGNOSIS — Z9849 Cataract extraction status, unspecified eye: Secondary | ICD-10-CM | POA: Diagnosis not present

## 2014-08-07 DIAGNOSIS — R5381 Other malaise: Secondary | ICD-10-CM | POA: Diagnosis not present

## 2014-08-07 DIAGNOSIS — N183 Chronic kidney disease, stage 3 unspecified: Secondary | ICD-10-CM

## 2014-08-07 DIAGNOSIS — E1122 Type 2 diabetes mellitus with diabetic chronic kidney disease: Secondary | ICD-10-CM

## 2014-08-07 DIAGNOSIS — I10 Essential (primary) hypertension: Secondary | ICD-10-CM | POA: Diagnosis present

## 2014-08-07 DIAGNOSIS — K861 Other chronic pancreatitis: Secondary | ICD-10-CM | POA: Diagnosis present

## 2014-08-07 DIAGNOSIS — K5900 Constipation, unspecified: Secondary | ICD-10-CM | POA: Diagnosis present

## 2014-08-07 DIAGNOSIS — R627 Adult failure to thrive: Secondary | ICD-10-CM

## 2014-08-07 DIAGNOSIS — D630 Anemia in neoplastic disease: Secondary | ICD-10-CM

## 2014-08-07 DIAGNOSIS — M79605 Pain in left leg: Secondary | ICD-10-CM

## 2014-08-07 DIAGNOSIS — E1129 Type 2 diabetes mellitus with other diabetic kidney complication: Secondary | ICD-10-CM

## 2014-08-07 DIAGNOSIS — E7800 Pure hypercholesterolemia, unspecified: Secondary | ICD-10-CM | POA: Diagnosis present

## 2014-08-07 LAB — COMPREHENSIVE METABOLIC PANEL
ALK PHOS: 185 U/L — AB (ref 39–117)
ALT: 21 U/L (ref 0–53)
ANION GAP: 12 (ref 5–15)
AST: 29 U/L (ref 0–37)
Albumin: 3 g/dL — ABNORMAL LOW (ref 3.5–5.2)
BILIRUBIN TOTAL: 0.7 mg/dL (ref 0.3–1.2)
BUN: 14 mg/dL (ref 6–23)
CHLORIDE: 101 meq/L (ref 96–112)
CO2: 24 mEq/L (ref 19–32)
Calcium: 8.7 mg/dL (ref 8.4–10.5)
Creatinine, Ser: 1.06 mg/dL (ref 0.50–1.35)
GFR calc non Af Amer: 69 mL/min — ABNORMAL LOW (ref >90)
GFR, EST AFRICAN AMERICAN: 80 mL/min — AB (ref >90)
GLUCOSE: 128 mg/dL — AB (ref 70–99)
POTASSIUM: 3.7 meq/L (ref 3.7–5.3)
Sodium: 137 mEq/L (ref 137–147)
Total Protein: 6.1 g/dL (ref 6.0–8.3)

## 2014-08-07 LAB — TSH: TSH: 0.748 u[IU]/mL (ref 0.350–4.500)

## 2014-08-07 LAB — HEMOGLOBIN A1C
HEMOGLOBIN A1C: 7.6 % — AB (ref <5.7)
Mean Plasma Glucose: 171 mg/dL — ABNORMAL HIGH (ref <117)

## 2014-08-07 LAB — CBC
HEMATOCRIT: 25.7 % — AB (ref 39.0–52.0)
HEMOGLOBIN: 9 g/dL — AB (ref 13.0–17.0)
MCH: 34.4 pg — ABNORMAL HIGH (ref 26.0–34.0)
MCHC: 35 g/dL (ref 30.0–36.0)
MCV: 98.1 fL (ref 78.0–100.0)
Platelets: 162 10*3/uL (ref 150–400)
RBC: 2.62 MIL/uL — ABNORMAL LOW (ref 4.22–5.81)
RDW: 20 % — ABNORMAL HIGH (ref 11.5–15.5)
WBC: 3.5 10*3/uL — AB (ref 4.0–10.5)

## 2014-08-07 LAB — URINALYSIS, ROUTINE W REFLEX MICROSCOPIC
Bilirubin Urine: NEGATIVE
Glucose, UA: NEGATIVE mg/dL
HGB URINE DIPSTICK: NEGATIVE
Ketones, ur: NEGATIVE mg/dL
Leukocytes, UA: NEGATIVE
Nitrite: NEGATIVE
PROTEIN: NEGATIVE mg/dL
Specific Gravity, Urine: 1.011 (ref 1.005–1.030)
UROBILINOGEN UA: 1 mg/dL (ref 0.0–1.0)
pH: 5.5 (ref 5.0–8.0)

## 2014-08-07 LAB — GLUCOSE, CAPILLARY
GLUCOSE-CAPILLARY: 101 mg/dL — AB (ref 70–99)
Glucose-Capillary: 79 mg/dL (ref 70–99)

## 2014-08-07 LAB — DIFFERENTIAL
Basophils Absolute: 0 10*3/uL (ref 0.0–0.1)
Basophils Relative: 0 % (ref 0–1)
EOS ABS: 0 10*3/uL (ref 0.0–0.7)
EOS PCT: 0 % (ref 0–5)
LYMPHS ABS: 0.3 10*3/uL — AB (ref 0.7–4.0)
Lymphocytes Relative: 9 % — ABNORMAL LOW (ref 12–46)
MONO ABS: 0.5 10*3/uL (ref 0.1–1.0)
MONOS PCT: 15 % — AB (ref 3–12)
NEUTROS PCT: 76 % (ref 43–77)
Neutro Abs: 2.7 10*3/uL (ref 1.7–7.7)

## 2014-08-07 LAB — TROPONIN I

## 2014-08-07 LAB — I-STAT CG4 LACTIC ACID, ED: Lactic Acid, Venous: 0.54 mmol/L (ref 0.5–2.2)

## 2014-08-07 LAB — CK: CK TOTAL: 72 U/L (ref 7–232)

## 2014-08-07 MED ORDER — MAGIC MOUTHWASH W/LIDOCAINE
5.0000 mL | Freq: Four times a day (QID) | ORAL | Status: DC
  Administered 2014-08-07 – 2014-08-09 (×5): 5 mL via ORAL
  Filled 2014-08-07 (×10): qty 5

## 2014-08-07 MED ORDER — SENNA 8.6 MG PO TABS
2.0000 | ORAL_TABLET | Freq: Every day | ORAL | Status: DC
  Administered 2014-08-07 – 2014-08-09 (×3): 17.2 mg via ORAL
  Filled 2014-08-07 (×3): qty 2

## 2014-08-07 MED ORDER — INSULIN GLARGINE 100 UNIT/ML ~~LOC~~ SOLN
21.0000 [IU] | Freq: Every day | SUBCUTANEOUS | Status: DC
  Administered 2014-08-07: 21 [IU] via SUBCUTANEOUS
  Filled 2014-08-07: qty 0.21

## 2014-08-07 MED ORDER — HYDROMORPHONE HCL 4 MG PO TABS
4.0000 mg | ORAL_TABLET | ORAL | Status: DC | PRN
  Administered 2014-08-07 – 2014-08-08 (×2): 4 mg via ORAL
  Filled 2014-08-07 (×2): qty 1

## 2014-08-07 MED ORDER — LIDOCAINE-PRILOCAINE 2.5-2.5 % EX CREA: 1.0000 "application " | TOPICAL_CREAM | CUTANEOUS | Status: DC | PRN

## 2014-08-07 MED ORDER — VITAMIN B-12 1000 MCG PO TABS
1000.0000 ug | ORAL_TABLET | Freq: Every day | ORAL | Status: DC
  Administered 2014-08-08 – 2014-08-09 (×2): 1000 ug via ORAL
  Filled 2014-08-07 (×3): qty 1

## 2014-08-07 MED ORDER — DASATINIB 50 MG PO TABS
50.0000 mg | ORAL_TABLET | Freq: Every day | ORAL | Status: DC
  Administered 2014-08-07 – 2014-08-08 (×2): 50 mg via ORAL

## 2014-08-07 MED ORDER — ACETAMINOPHEN 325 MG PO TABS: 650.0000 mg | ORAL_TABLET | Freq: Four times a day (QID) | ORAL | Status: DC | PRN

## 2014-08-07 MED ORDER — ENOXAPARIN SODIUM 40 MG/0.4ML ~~LOC~~ SOLN
40.0000 mg | SUBCUTANEOUS | Status: DC
  Administered 2014-08-07 – 2014-08-08 (×2): 40 mg via SUBCUTANEOUS
  Filled 2014-08-07 (×4): qty 0.4

## 2014-08-07 MED ORDER — ONDANSETRON HCL 4 MG/2ML IJ SOLN
4.0000 mg | Freq: Four times a day (QID) | INTRAMUSCULAR | Status: DC | PRN
  Administered 2014-08-08 – 2014-08-09 (×2): 4 mg via INTRAVENOUS
  Filled 2014-08-07 (×2): qty 2

## 2014-08-07 MED ORDER — DOCUSATE SODIUM 100 MG PO CAPS
100.0000 mg | ORAL_CAPSULE | Freq: Two times a day (BID) | ORAL | Status: DC
  Administered 2014-08-07 – 2014-08-09 (×5): 100 mg via ORAL
  Filled 2014-08-07 (×6): qty 1

## 2014-08-07 MED ORDER — SIMVASTATIN 20 MG PO TABS
20.0000 mg | ORAL_TABLET | Freq: Every evening | ORAL | Status: DC
  Administered 2014-08-07 – 2014-08-08 (×2): 20 mg via ORAL
  Filled 2014-08-07 (×3): qty 1

## 2014-08-07 MED ORDER — MORPHINE SULFATE ER 30 MG PO TBCR
30.0000 mg | EXTENDED_RELEASE_TABLET | Freq: Two times a day (BID) | ORAL | Status: DC
  Administered 2014-08-07 – 2014-08-09 (×5): 30 mg via ORAL
  Filled 2014-08-07 (×5): qty 1

## 2014-08-07 MED ORDER — INSULIN ASPART 100 UNIT/ML ~~LOC~~ SOLN: 0.0000 [IU] | Freq: Every day | SUBCUTANEOUS | Status: DC

## 2014-08-07 MED ORDER — VITAMIN D3 25 MCG (1000 UNIT) PO TABS
1000.0000 [IU] | ORAL_TABLET | Freq: Every day | ORAL | Status: DC
  Administered 2014-08-08 – 2014-08-09 (×2): 1000 [IU] via ORAL
  Filled 2014-08-07 (×3): qty 1

## 2014-08-07 MED ORDER — LINAGLIPTIN 5 MG PO TABS
5.0000 mg | ORAL_TABLET | Freq: Every day | ORAL | Status: DC
  Filled 2014-08-07 (×2): qty 1

## 2014-08-07 MED ORDER — ACETAMINOPHEN 650 MG RE SUPP: 650.0000 mg | Freq: Four times a day (QID) | RECTAL | Status: DC | PRN

## 2014-08-07 MED ORDER — ASPIRIN EC 81 MG PO TBEC
81.0000 mg | DELAYED_RELEASE_TABLET | Freq: Every day | ORAL | Status: DC
  Administered 2014-08-08 – 2014-08-09 (×2): 81 mg via ORAL
  Filled 2014-08-07 (×3): qty 1

## 2014-08-07 MED ORDER — ONDANSETRON HCL 4 MG PO TABS
4.0000 mg | ORAL_TABLET | Freq: Four times a day (QID) | ORAL | Status: DC | PRN
  Administered 2014-08-07: 4 mg via ORAL
  Filled 2014-08-07: qty 1

## 2014-08-07 MED ORDER — SODIUM CHLORIDE 0.9 % IV SOLN
INTRAVENOUS | Status: DC
  Administered 2014-08-07 – 2014-08-08 (×3): via INTRAVENOUS
  Filled 2014-08-07 (×3): qty 1000

## 2014-08-07 MED ORDER — SODIUM CHLORIDE 0.9 % IV BOLUS (SEPSIS)
1000.0000 mL | Freq: Once | INTRAVENOUS | Status: AC
  Administered 2014-08-07: 1000 mL via INTRAVENOUS

## 2014-08-07 MED ORDER — INSULIN ASPART 100 UNIT/ML ~~LOC~~ SOLN
0.0000 [IU] | Freq: Three times a day (TID) | SUBCUTANEOUS | Status: DC
  Administered 2014-08-08: 2 [IU] via SUBCUTANEOUS

## 2014-08-07 MED ORDER — DASATINIB 50 MG PO TABS: 50.0000 mg | ORAL_TABLET | Freq: Every day | ORAL | Status: DC

## 2014-08-07 NOTE — ED Notes (Signed)
Last chemo last Wednesday 9/16 and radiation yesterday 9/21

## 2014-08-07 NOTE — H&P (Signed)
History and Physical  Evan [name] FIE:[id number] DOB: [birth year] DOA: 08/07/2014   PCP: [name], MD   Chief Complaint: dry heaves, FTT, generalized weakness  HPI:  73 year old male with a history of diabetes mellitus, CML, hypertension, and newly diagnosed pancreatic adenocarcinoma (July 2015) presents with 2 weeks of increasing generalized weakness and lethargy. The patient is very sleepy, but easily arousable on examination. All of this history is obtained from review of the medical records because the patient's wife at bedside. When the patient is aroused, he is lucid and able to carry on a conversation. However when he is left alone, he falls back asleep. The patient was diagnosed with pancreatic adenocarcinoma when he presented with painless jaundice in July 2015. Since then, the patient was started on chemotherapy with his last dose on 08/01/2014 with Gemzar. The patient has also had radiation therapy with his last radiation treatment on 08/06/2014. The patient's wife relates increasing lethargy, generalized weakness, and intermittent dry heaves on a daily basis for the past 2 weeks. There's also been a component of constipation with his last bowel movement one-week ago. There's been no reports of hematemesis, hematochezia, melena, hematuria. In addition, the patient has been having epigastric pain requiring use of MS Contin as well as Dilaudid po at home. There is no reported fevers, chills, headaches, dizziness, syncope, chest pain. He has had dyspnea on exertion. Presently, the plan is for MRI of his abdomen approximately 4-5 weeks after his radiation and neoadjuvant chemotherapy in preparation for a possible Whipple's procedure.  In the emergency department, the patient's BMP was essentially unremarkable.  Hepatic Enzymes were unremarkable. CBC showed a white blood cell count 2.5, hemoglobin 9.0, platelets 162,000. Lactic acid was 0.54. The patient was given 1 L normal  saline. Assessment/Plan: Generalized weakness/failure to thrive -Multifactorial including his chemotherapy, opioids, and dehydration -UA and urine culture -TSH -A.m. Cortisol -EKG -Check CPK -Nutrition eval -prealbumin -PT evaluation Epigastric pain  -Likely related to the patient's biliary stent  -Cycle troponins in the event that this may be anginal equivalent Diabetes mellitus type 2  -Half home dose of Lantus  -NovoLog sliding scale  -Hemoglobin A1c  -Continue saxagliptin Stage II a pancreatic adenocarcinoma  -Dr. Alvy Bimler has been informed of the patient's admission Hypertension  -Discontinue losartan as the patient's blood pressure is soft  Chronic pain  -Continue home dose of MS Contin  -Hydromorphone 4 mg po every 4 hours when necessary pain  CML  -In remission  -Continue dasatinib Constipation -senna and colace to start Lower extremity edema and pain -venous duplex      Past Medical History  Diagnosis Date  . Chronic back pain greater than 3 months duration   . Bulging discs   . Diabetes mellitus     type II; neuropathy;   . Hypertension   . High cholesterol   . Leukocytosis   . GERD (gastroesophageal reflux disease)   . Pancreatitis 2004  . Anxiety   . DDD (degenerative disc disease) 2005    lumbar spine  . History of smoking 08/01/2012  . Weight loss 08/01/2012  . Cough 08/01/2012  . 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 Left leg pain 08/05/2012  . 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 Chronic pancreatitis   . Pancreatic cancer 06/08/2014  . OSA (obstructive sleep apnea) 2010    does not use CPAP  . Nausea alone 06/13/2014  . Allergy   . Mucositis (ulcerative) due to antineoplastic therapy 07/02/2014  .  Constipated 07/16/2014   Past Surgical History  Procedure Laterality Date  . Cholecystectomy      laparoscopic  . Hernia repair      umbilical  . Skin cancer removed  2012    right ear, basal cell carcinoma.   Marland Kitchen Spinal stretching    .  Colonoscopy  2012    [facility]  . Eus Left 06/07/2014    Procedure: UPPER ENDOSCOPIC ULTRASOUND (EUS) LINEAR;  Surgeon: Arta Silence, MD;  Location: [facility];  Service: Endoscopy;  Laterality: Left;  . Ercp N/A 06/07/2014    Procedure: ENDOSCOPIC RETROGRADE CHOLANGIOPANCREATOGRAPHY (ERCP);  Surgeon: Arta Silence, MD;  Location: [facility];  Service: Endoscopy;  Laterality: N/A;  . Eye surgery      bilateral cataracts with lens implant  . Cataract extraction Bilateral   . Port acath placement Left 06/14/14  . Portacath placement Left 06/14/2014    Procedure: INSERTION PORT-A-CATH;  Surgeon: Stark Klein, MD;  Location: [facility] ORS;  Service: General;  Laterality: Left;   Social History:  reports that he quit smoking about 10 years ago. He has never used smokeless tobacco. He reports that he does not drink alcohol or use illicit drugs.   Family History  Problem Relation Age of Onset  . Heart attack Mother   . Cancer Mother 47    lung  . Heart attack Father   . Stroke Father      Allergies  Allergen Reactions  . Ace Inhibitors   . Ciprofloxacin Rash      Prior to Admission medications   Medication Sig Start Date End Date Taking? Authorizing Provider  acetaminophen (TYLENOL) 325 MG tablet Take 650 mg by mouth every 6 (six) hours as needed for moderate pain (low grade fever).    Yes Historical Provider, MD  Alum & Mag Hydroxide-Simeth (MAGIC MOUTHWASH W/LIDOCAINE) SOLN Take 5 mLs by mouth 4 (four) times daily.   Yes Historical Provider, MD  amoxicillin-clavulanate (AUGMENTIN) 875-125 MG per tablet Take 1 tablet by mouth 2 (two) times daily. 07/24/14  Yes [name], MD  aspirin EC 81 MG tablet Take 81 mg by mouth daily.   Yes Historical Provider, MD  B Complex-C (SUPER B COMPLEX/VITAMIN C PO) Take 1 tablet by mouth daily.    Yes Historical Provider, MD  cholecalciferol (VITAMIN D) 1000 UNITS tablet Take 1,000 Units by mouth daily.   Yes Historical Provider, MD  dasatinib (SPRYCEL) 50 MG  tablet Take 50 mg by mouth at bedtime. 01/15/14  Yes [name], MD  diphenhydrAMINE (BENADRYL) 25 mg capsule Take 25 mg by mouth every 6 (six) hours as needed for itching (itching).   Yes Historical Provider, MD  HYDROmorphone (DILAUDID) 4 MG tablet Take 4 mg by mouth every 4 (four) hours as needed for severe pain (pain).   Yes Historical Provider, MD  insulin glargine (LANTUS) 100 UNIT/ML injection Inject 42 Units into the skin at bedtime.    Yes [name], MD  lactulose [facility]) 10 GM/15ML solution Take 15 mLs by mouth as needed for severe constipation (constipation). Hasn't used as yet, 06/21/14  Yes Historical Provider, MD  lidocaine-prilocaine (EMLA) cream Apply 1 application topically as needed (pain).   Yes Historical Provider, MD  losartan (COZAAR) 100 MG tablet Take 100 mg by mouth every morning.    Yes Historical Provider, MD  metFORMIN (GLUCOPHAGE) 1000 MG tablet Take 1,000 mg by mouth 2 (two) times daily with a meal.   Yes Historical Provider, MD  morphine (MS CONTIN)  30 MG 12 hr tablet Take 1 tablet (30 mg total) by mouth every 12 (twelve) hours. 07/24/14  Yes [name], MD  promethazine (PHENERGAN) 25 MG tablet Take 25 mg by mouth every 6 (six) hours as needed for nausea or vomiting (nausea).   Yes Historical Provider, MD  saxagliptin HCl (ONGLYZA) 5 MG TABS tablet Take 5 mg by mouth daily.   Yes Historical Provider, MD  senna (SENOKOT) 8.6 MG tablet Take 2 tablets by mouth daily.    Yes Historical Provider, MD  simvastatin (ZOCOR) 20 MG tablet Take 20 mg by mouth every evening.   Yes Historical Provider, MD  vitamin B-12 (CYANOCOBALAMIN) 1000 MCG tablet Take 1,000 mcg by mouth daily.   Yes Historical Provider, MD    Review of Systems:  Constitutional:  No weight loss, night sweats, Fevers, chills No headache.  No vision loss.  No eye pain or scotoma ENT:  No Difficulty swallowing,Tooth/dental problems,Sore throat,  No ear ache, post nasal drip,  Cardio-vascular:  No  chest pain, Orthopnea, PND, swelling in lower extremities,  dizziness, palpitations  GI:  No  diarrhea, loss of appetite, hematochezia, melena, heartburn, indigestion, Resp:   No cough. No coughing up of blood .No wheezing.No chest wall deformity  Skin:  no rash or lesions.  GU:  no dysuria, change in color of urine, no urgency or frequency. No flank pain.  Musculoskeletal:  No joint pain or swelling. No decreased range of motion. No back pain.  Psych:  No change in mood or affect. No depression or anxiety. Neurologic: No headache, no dysesthesia, no focal weakness, no vision loss. No syncope  Physical Exam: Filed Vitals:   08/07/14 1025 08/07/14 1235 08/07/14 1238  BP: 129/60 129/63   Pulse: 86 76   Temp: 98.3 F (36.8 C)  98 F (36.7 C)  TempSrc: Oral  Oral  Resp: 16 18   SpO2: 96% 94%    General:  A&O x 3, NAD, nontoxic, pleasant/cooperative Head/Eye: No conjunctival hemorrhage, no icterus, [place]/AT, No nystagmus ENT:  No icterus,  No thrush, good dentition, no pharyngeal exudate Neck:  No masses, no lymphadenpathy, no bruits CV:  RRR, no rub, no gallop, no S3 Lung:  CTAB, good air movement, no wheeze, no rhonchi Abdomen: soft/NT, +BS, nondistended, no peritoneal signs Ext: No cyanosis, No rashes, No petechiae, No lymphangitis, 1+LE edema  Labs on Admission:  Basic Metabolic Panel:  Recent Labs Lab 08/01/14 1017 08/06/14 1515 08/07/14 1124  NA 137 138 137  K 4.4 3.7 3.7  CL  --   --  101  CO2 24 25 24   GLUCOSE 135 159* 128*  BUN 10.2 12.9 14  CREATININE 1.2 1.0 1.06  CALCIUM 9.9 9.2 8.7   Liver Function Tests:  Recent Labs Lab 08/01/14 1017 08/06/14 1515 08/07/14 1124  AST 27 25 29   ALT 18 21 21   ALKPHOS 173* 179* 185*  BILITOT 0.81 0.66 0.7  PROT 6.6 6.4 6.1  ALBUMIN 3.2* 3.0* 3.0*   No results found for this basename: LIPASE, AMYLASE,  in the last 168 hours No results found for this basename: AMMONIA,  in the last 168 hours CBC:  Recent  Labs Lab 08/01/14 1017 08/06/14 1515 08/07/14 1124  WBC 6.6 2.8* 3.5*  NEUTROABS 5.3 2.2  --   HGB 10.1* 9.5* 9.0*  HCT 29.3* 27.6* 25.7*  MCV 99.1* 97.5 98.1  PLT 314 195 162   Cardiac Enzymes: No results found for this basename: CKTOTAL, CKMB, CKMBINDEX, TROPONINI,  in the last 168 hours BNP: No components found with this basename: POCBNP,  CBG: No results found for this basename: GLUCAP,  in the last 168 hours  Radiological Exams on Admission: No results found.  EKG: Independently reviewed. pending    Time spent:60 minutes Code Status:   FULL Family Communication:   Wife at bedside   Gyasi Hazzard, DO  [facility] Pager [phone]  If 7PM-7AM, please contact night-coverage [email or web address] Password [email or web address] 08/07/2014, 2:10 PM

## 2014-08-07 NOTE — ED Notes (Signed)
Bed: WA13 Expected date:  Expected time:  Means of arrival:  Comments: Hold for triage 1 

## 2014-08-07 NOTE — ED Notes (Signed)
Pt reminded of need for urine specimen 

## 2014-08-07 NOTE — ED Notes (Signed)
Per pt/wife-states has been receiving IV therapy at home-finished radiation yesterday-increased weakness-nausea-MD told to come her to get admitted for IV fluids-can no longer get at home because of weakness

## 2014-08-07 NOTE — ED Provider Notes (Signed)
CSN: [id number]     Arrival date & time 08/07/14  1019 History   First MD Initiated Contact with Patient 08/07/14 1043     Chief Complaint  Patient presents with  . Dehydration     (Consider location/radiation/quality/duration/timing/severity/associated sxs/prior Treatment) Patient is a 73 y.o. male presenting with weakness. The history is provided by the patient.  Weakness This is a new problem. The current episode started more than 2 days ago. The problem occurs constantly. The problem has been gradually worsening. Pertinent negatives include no chest pain, no abdominal pain and no shortness of breath. Nothing aggravates the symptoms. Nothing relieves the symptoms. He has tried nothing for the symptoms.    Past Medical History  Diagnosis Date  . Chronic back pain greater than 3 months duration   . Bulging discs   . Diabetes mellitus     type II; neuropathy;   . Hypertension   . High cholesterol   . Leukocytosis   . GERD (gastroesophageal reflux disease)   . Pancreatitis 2004  . Anxiety   . DDD (degenerative disc disease) 2005    lumbar spine  . History of smoking 08/01/2012  . Weight loss 08/01/2012  . Cough 08/01/2012  . 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 Left leg pain 08/05/2012  . 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 Chronic pancreatitis   . Pancreatic cancer 06/08/2014  . OSA (obstructive sleep apnea) 2010    does not use CPAP  . Nausea alone 06/13/2014  . Allergy   . Mucositis (ulcerative) due to antineoplastic therapy 07/02/2014  . Constipated 07/16/2014   Past Surgical History  Procedure Laterality Date  . Cholecystectomy      laparoscopic  . Hernia repair      umbilical  . Skin cancer removed  2012    right ear, basal cell carcinoma.   Marland Kitchen Spinal stretching    . Colonoscopy  2012    [facility]  . Eus Left 06/07/2014    Procedure: UPPER ENDOSCOPIC ULTRASOUND (EUS) LINEAR;  Surgeon: Arta Silence, MD;  Location: [facility];  Service: Endoscopy;  Laterality: Left;   . Ercp N/A 06/07/2014    Procedure: ENDOSCOPIC RETROGRADE CHOLANGIOPANCREATOGRAPHY (ERCP);  Surgeon: Arta Silence, MD;  Location: [facility];  Service: Endoscopy;  Laterality: N/A;  . Eye surgery      bilateral cataracts with lens implant  . Cataract extraction Bilateral   . Port acath placement Left 06/14/14  . Portacath placement Left 06/14/2014    Procedure: INSERTION PORT-A-CATH;  Surgeon: Stark Klein, MD;  Location: [facility];  Service: General;  Laterality: Left;   Family History  Problem Relation Age of Onset  . Heart attack Mother   . Cancer Mother 68    lung  . Heart attack Father   . Stroke Father    History  Substance Use Topics  . Smoking status: Former Smoker -- 1.50 packs/day for 40 years    Quit date: 01/24/2004  . Smokeless tobacco: Never Used  . Alcohol Use: No    Review of Systems  Constitutional: Positive for appetite change (decreased PO intake). Negative for fever and chills.  Respiratory: Negative for cough and shortness of breath.   Cardiovascular: Negative for chest pain.  Gastrointestinal: Negative for vomiting and abdominal pain.  Neurological: Positive for weakness.  All other systems reviewed and are negative.     Allergies  Ace inhibitors and Ciprofloxacin  Home Medications   Prior to Admission medications   Medication Sig Start Date End Date  Taking? Authorizing Provider  acetaminophen (TYLENOL) 325 MG tablet Take 650 mg by mouth every 6 (six) hours as needed for moderate pain.    Historical Provider, MD  Alum & Mag Hydroxide-Simeth (MAGIC MOUTHWASH W/LIDOCAINE) SOLN Take 5 mLs by mouth 4 (four) times daily. 07/02/14   [name], MD  amoxicillin-clavulanate (AUGMENTIN) 875-125 MG per tablet Take 1 tablet by mouth 2 (two) times daily. 07/24/14   [name], MD  aspirin EC 81 MG tablet Take 81 mg by mouth daily.    Historical Provider, MD  B Complex-C (SUPER B COMPLEX/VITAMIN C PO) Take 1 tablet by mouth daily.     Historical Provider, MD   cholecalciferol (VITAMIN D) 1000 UNITS tablet Take 1,000 Units by mouth daily.    Historical Provider, MD  dasatinib (SPRYCEL) 50 MG tablet Take 50 mg by mouth at bedtime. 01/15/14   [name], MD  diphenhydrAMINE (BENADRYL) 25 mg capsule Take 1 capsule (25 mg total) by mouth every 6 (six) hours as needed for itching. 06/08/14   [name], MD  hyaluronate sodium (RADIAPLEXRX) GEL Apply 1 application topically daily. Apply to affected treated area daily and prn after rad txs 06/25/14   Historical Provider, MD  hydrocortisone cream 1 % Apply 1 application topically 2 (two) times daily as needed for itching (Rash).    Historical Provider, MD  HYDROmorphone (DILAUDID) 4 MG tablet Take 1 tablet (4 mg total) by mouth every 4 (four) hours as needed for severe pain. 07/16/14   [name], MD  insulin glargine (LANTUS) 100 UNIT/ML injection Inject 42 Units into the skin at bedtime.     [name], MD  lactulose [facility]) 10 GM/15ML solution Take 15 mLs by mouth as needed. Hasn't used as yet, 06/21/14   Historical Provider, MD  lidocaine-prilocaine (EMLA) cream Apply 1 application topically as needed. 06/13/14   [name], MD  losartan (COZAAR) 100 MG tablet Take 100 mg by mouth every morning.     Historical Provider, MD  metFORMIN (GLUCOPHAGE) 1000 MG tablet Take 1,000 mg by mouth 2 (two) times daily with a meal.    Historical Provider, MD  morphine (MS CONTIN) 30 MG 12 hr tablet Take 1 tablet (30 mg total) by mouth every 12 (twelve) hours. 07/24/14   [name], MD  ondansetron (ZOFRAN) 8 MG tablet Take 1 tablet (8 mg total) by mouth every 8 (eight) hours as needed for nausea. 07/17/14   [name], MD  promethazine (PHENERGAN) 25 MG tablet Take 1 tablet (25 mg total) by mouth every 6 (six) hours as needed for nausea or vomiting. 06/13/14   [name], MD  saxagliptin HCl (ONGLYZA) 5 MG TABS tablet Take 5 mg by mouth daily.    Historical Provider, MD  senna (SENOKOT) 8.6 MG tablet Take 2 tablets  by mouth daily.     Historical Provider, MD  simvastatin (ZOCOR) 20 MG tablet Take 20 mg by mouth every evening.    Historical Provider, MD  Skin Protectants, Misc. (EUCERIN) cream Apply 1 application topically as needed for dry skin.    Historical Provider, MD  vitamin B-12 (CYANOCOBALAMIN) 1000 MCG tablet Take 1,000 mcg by mouth daily.    Historical Provider, MD   BP 129/60  Pulse 86  Temp(Src) 98.3 F (36.8 C) (Oral)  Resp 16  SpO2 96% Physical Exam  Nursing note and vitals reviewed. Constitutional: He is oriented to person, place, and time. He appears well-developed and well-nourished. No distress.  HENT:  Head:  Normocephalic and atraumatic.  Mouth/Throat: Oropharynx is clear and moist. No oropharyngeal exudate.  Eyes: EOM are normal. Pupils are equal, round, and reactive to light.  Neck: Normal range of motion. Neck supple.  Cardiovascular: Normal rate and regular rhythm.  Exam reveals no friction rub.   No murmur heard. Pulmonary/Chest: Effort normal and breath sounds normal. No respiratory distress. He has no wheezes. He has no rales.  Abdominal: He exhibits no distension. There is tenderness (mild, lower abdomen). There is no rebound.  Musculoskeletal: Normal range of motion. He exhibits no edema.  Neurological: He is alert and oriented to person, place, and time.  Skin: No rash noted. He is not diaphoretic.    ED Course  Procedures (including critical care time) Labs Review Labs Reviewed  CBC  COMPREHENSIVE METABOLIC PANEL  URINALYSIS, ROUTINE W REFLEX MICROSCOPIC  URINALYSIS, ROUTINE W REFLEX MICROSCOPIC  I-STAT CG4 LACTIC ACID, ED    Imaging Review No results found.   EKG Interpretation None      MDM   Final diagnoses:  Weakness    52M presents with weakness - instructed to come to ED by Dr. Alvy Bimler. Just finished daily radiation and weekly chemo for pancreatic cancer on top of chronic leukemia. Was receiving weekly IVF, but didn't go because he was too  weak.  AFVSS here. Mild abdominal pain in lower abdomen, similar to chronic pain from his cancer, but worse. Will check labs. Labs unremarkable. I spoke with Dr. Alvy Bimler with Oncology who recommended admission.  Evelina Bucy, MD 08/07/14 [phone]

## 2014-08-07 NOTE — ED Notes (Signed)
Pt given urinal and made aware of need for urine specimen 

## 2014-08-07 NOTE — Telephone Encounter (Signed)
Wife left VM this morning states she needs to cancel IVF appt today because she feels pt is too weak to keep coming back and forth to clinic.  She feels he needs to be admitted to hospital.  Notified Dr. Alvy Bimler and she instructs for wife to take pt to ED or call 911 for ambulance transfer if he is too weak to get in the car.  She verbalized understanding.

## 2014-08-07 NOTE — Progress Notes (Signed)
Lengthy visit with patient's wife at [facility] since patient is hospitalized at the moment. Offered support and reminded them about support center resources and services available. Listened empathetically to spouse as she talked about her stress in caregiving; encouraged self care.  Support; presence.   Epifania Gore, PhD, [place]

## 2014-08-07 NOTE — Progress Notes (Signed)
PO PRN Zofran admin as ordered. Nausea resolving per pt and wife at bedside

## 2014-08-08 ENCOUNTER — Ambulatory Visit: Payer: Medicare Other

## 2014-08-08 DIAGNOSIS — E1169 Type 2 diabetes mellitus with other specified complication: Secondary | ICD-10-CM

## 2014-08-08 DIAGNOSIS — C9211 Chronic myeloid leukemia, BCR/ABL-positive, in remission: Secondary | ICD-10-CM

## 2014-08-08 DIAGNOSIS — E43 Unspecified severe protein-calorie malnutrition: Secondary | ICD-10-CM | POA: Insufficient documentation

## 2014-08-08 DIAGNOSIS — R627 Adult failure to thrive: Secondary | ICD-10-CM | POA: Diagnosis present

## 2014-08-08 DIAGNOSIS — M79609 Pain in unspecified limb: Secondary | ICD-10-CM

## 2014-08-08 DIAGNOSIS — E11649 Type 2 diabetes mellitus with hypoglycemia without coma: Secondary | ICD-10-CM | POA: Diagnosis not present

## 2014-08-08 DIAGNOSIS — K5900 Constipation, unspecified: Secondary | ICD-10-CM

## 2014-08-08 DIAGNOSIS — R110 Nausea: Secondary | ICD-10-CM

## 2014-08-08 DIAGNOSIS — M7989 Other specified soft tissue disorders: Secondary | ICD-10-CM

## 2014-08-08 DIAGNOSIS — R5383 Other fatigue: Secondary | ICD-10-CM

## 2014-08-08 DIAGNOSIS — R5381 Other malaise: Secondary | ICD-10-CM

## 2014-08-08 DIAGNOSIS — C250 Malignant neoplasm of head of pancreas: Secondary | ICD-10-CM

## 2014-08-08 DIAGNOSIS — D630 Anemia in neoplastic disease: Secondary | ICD-10-CM

## 2014-08-08 LAB — GLUCOSE, CAPILLARY
GLUCOSE-CAPILLARY: 63 mg/dL — AB (ref 70–99)
GLUCOSE-CAPILLARY: 155 mg/dL — AB (ref 70–99)
Glucose-Capillary: 56 mg/dL — ABNORMAL LOW (ref 70–99)
Glucose-Capillary: 76 mg/dL (ref 70–99)

## 2014-08-08 LAB — BASIC METABOLIC PANEL
Anion gap: 11 (ref 5–15)
BUN: 10 mg/dL (ref 6–23)
CO2: 24 mEq/L (ref 19–32)
CREATININE: 0.88 mg/dL (ref 0.50–1.35)
Calcium: 8.3 mg/dL — ABNORMAL LOW (ref 8.4–10.5)
Chloride: 105 mEq/L (ref 96–112)
GFR calc Af Amer: >90 mL/min (ref >90)
GFR, EST NON AFRICAN AMERICAN: 84 mL/min — AB (ref >90)
GLUCOSE: 66 mg/dL — AB (ref 70–99)
POTASSIUM: 3.7 meq/L (ref 3.7–5.3)
Sodium: 140 mEq/L (ref 137–147)

## 2014-08-08 LAB — URINALYSIS, ROUTINE W REFLEX MICROSCOPIC
Bilirubin Urine: NEGATIVE
Bilirubin Urine: NEGATIVE
Glucose, UA: NEGATIVE mg/dL
Glucose, UA: NEGATIVE mg/dL
HGB URINE DIPSTICK: NEGATIVE
Hgb urine dipstick: NEGATIVE
Ketones, ur: NEGATIVE mg/dL
Ketones, ur: NEGATIVE mg/dL
LEUKOCYTES UA: NEGATIVE
Leukocytes, UA: NEGATIVE
Nitrite: NEGATIVE
Nitrite: NEGATIVE
PH: 5.5 (ref 5.0–8.0)
PROTEIN: NEGATIVE mg/dL
PROTEIN: NEGATIVE mg/dL
SPECIFIC GRAVITY, URINE: 1.015 (ref 1.005–1.030)
Specific Gravity, Urine: 1.014 (ref 1.005–1.030)
UROBILINOGEN UA: 1 mg/dL (ref 0.0–1.0)
Urobilinogen, UA: 1 mg/dL (ref 0.0–1.0)
pH: 5.5 (ref 5.0–8.0)

## 2014-08-08 LAB — CBC
HCT: 25.9 % — ABNORMAL LOW (ref 39.0–52.0)
Hemoglobin: 8.9 g/dL — ABNORMAL LOW (ref 13.0–17.0)
MCH: 34.2 pg — ABNORMAL HIGH (ref 26.0–34.0)
MCHC: 34.4 g/dL (ref 30.0–36.0)
MCV: 99.6 fL (ref 78.0–100.0)
Platelets: 146 10*3/uL — ABNORMAL LOW (ref 150–400)
RBC: 2.6 MIL/uL — AB (ref 4.22–5.81)
RDW: 20.3 % — ABNORMAL HIGH (ref 11.5–15.5)
WBC: 6.1 10*3/uL (ref 4.0–10.5)

## 2014-08-08 LAB — PREALBUMIN: Prealbumin: 12.2 mg/dL — ABNORMAL LOW (ref 17.0–34.0)

## 2014-08-08 LAB — TROPONIN I

## 2014-08-08 MED ORDER — INSULIN GLARGINE 100 UNIT/ML ~~LOC~~ SOLN
15.0000 [IU] | Freq: Every day | SUBCUTANEOUS | Status: DC
  Administered 2014-08-08: 15 [IU] via SUBCUTANEOUS
  Filled 2014-08-08 (×2): qty 0.15

## 2014-08-08 MED ORDER — POLYETHYLENE GLYCOL 3350 17 G PO PACK
17.0000 g | PACK | Freq: Two times a day (BID) | ORAL | Status: DC
Start: 2014-08-08 — End: 2014-08-09
  Administered 2014-08-08 – 2014-08-09 (×3): 17 g via ORAL
  Filled 2014-08-08 (×4): qty 1

## 2014-08-08 MED ORDER — BISACODYL 10 MG RE SUPP
10.0000 mg | Freq: Once | RECTAL | Status: AC
  Administered 2014-08-08: 10 mg via RECTAL
  Filled 2014-08-08: qty 1

## 2014-08-08 NOTE — Progress Notes (Signed)
**Note Evan-Identified via Obfuscation** PROGRESS NOTE    DETAVIOUS RINN [name]:[id number] DOB: 26-Mar-1941 DOA: 08/07/2014 PCP: [name], MD  HPI/Brief narrative 73 year old male with history of DM 2, CML, HTN, newly diagnosed pancreatic adenocarcinoma presented to ED with two-week history of increasing generalized weakness, lethargy, intermittent dry heaves and constipation. Patient has epigastric pain and taking MS Contin and Dilaudid at home. In the ED, BMP, LFTs unremarkable. CBC showed that WBC 2.5, hemoglobin 9. Lactate normal.  Assessment/Plan:  1. Generalized weakness/failure to thrive: Multifactorial from chemotherapy, dehydration, opioids and possibly hypoglycemia. UA negative. TSH: Normal. Follow a.m. cortisol. PT evaluation. 2. Epigastric pain: Likely secondary to pancreatic cancer and biliary stent. No acute abdominal signs. Troponins x3 negative. 3. Type II DM with hypoglycemia: Lantus was reduced to half the home dose last night despite which patient had CBG of 56 this morning. Reduce Lantus further to 15 units each bedtime and DC bedtime SSI. DC Saxaglipitin. Monitor closely. Continue regular diet. 4. Stage II pancreatic adenocarcinoma: Admitting M.D. has informed primary oncologist of patient's admission. s/p chemotherapy and radiation. 5. Hypertension: Controlled off medications. 6. Chronic pain: For now continue home dose of MS Contin and oral Dilaudid-may have to reduce some of these medications if lethargy and somnolence becomes an issue. 7. 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 Continue dasatinib. 8. Constipation: Bowel regimen. 9. Lower extremity edema and pain: Venous Dopplers negative for DVT. 10. Anemia: Secondary to chronic disease, cancer and cancer treatment.   Code Status: Full Family Communication: None at bedside Disposition Plan: Home when medically stable   Consultants:  None  Procedures:  None  Antibiotics:  None   Subjective: Denies complaints. Denies pain. As per  nursing, no acute events.  Objective: Filed Vitals:   08/07/14 1238 08/07/14 1520 08/07/14 2201 08/08/14 0415  BP:  130/71 145/72 130/76  Pulse:  82 84 91  Temp: 98 F (36.7 C) 98.5 F (36.9 C) 98.4 F (36.9 C) 98.3 F (36.8 C)  TempSrc: Oral Oral Oral Oral  Resp:  16 18 16   Height:  6\' 2"  (1.88 m)    Weight:  102.8 kg (226 lb 10.1 oz)    SpO2:  98% 96% 93%    Intake/Output Summary (Last 24 hours) at 08/08/14 1324 Last data filed at 08/08/14 0416  Gross per 24 hour  Intake 1042.6 ml  Output     50 ml  Net  992.6 ml   Filed Weights   08/07/14 1520  Weight: 102.8 kg (226 lb 10.1 oz)     Exam:  General exam: Pleasant elderly male sitting up comfortably propped up in bed. Respiratory system: Clear. No increased work of breathing. Cardiovascular system: S1 & S2 heard, RRR. No JVD, murmurs, gallops, clicks. Trace bilateral ankle edema. Gastrointestinal system: Abdomen is nondistended, soft and nontender. Normal bowel sounds heard. Central nervous system: Alert and oriented. No focal neurological deficits. Extremities: Symmetric 5 x 5 power.   Data Reviewed: Basic Metabolic Panel:  Recent Labs Lab 08/06/14 1515 08/07/14 1124 08/08/14 0425  NA 138 137 140  K 3.7 3.7 3.7  CL  --  101 105  CO2 25 24 24   GLUCOSE 159* 128* 66*  BUN 12.9 14 10   CREATININE 1.0 1.06 0.88  CALCIUM 9.2 8.7 8.3*   Liver Function Tests:  Recent Labs Lab 08/06/14 1515 08/07/14 1124  AST 25 29  ALT 21 21  ALKPHOS 179* 185*  BILITOT 0.66 0.7  PROT 6.4 6.1  ALBUMIN 3.0* 3.0*   No  results found for this basename: LIPASE, AMYLASE,  in the last 168 hours No results found for this basename: AMMONIA,  in the last 168 hours CBC:  Recent Labs Lab 08/06/14 1515 08/07/14 1124 08/08/14 0425  WBC 2.8* 3.5* 6.1  NEUTROABS 2.2 2.7  --   HGB 9.5* 9.0* 8.9*  HCT 27.6* 25.7* 25.9*  MCV 97.5 98.1 99.6  PLT 195 162 146*   Cardiac Enzymes:  Recent Labs Lab 08/07/14 1555  08/07/14 2039 08/08/14 0425  CKTOTAL 72  --   --   TROPONINI <0.30 <0.30 <0.30   BNP (last 3 results) No results found for this basename: PROBNP,  in the last 8760 hours CBG:  Recent Labs Lab 08/07/14 1728 08/07/14 2205 08/08/14 0737 08/08/14 0803 08/08/14 1117  GLUCAP 79 101* 56* 63* 155*    Recent Results (from the past 240 hour(s))  TECHNOLOGIST REVIEW     Status: None   Collection Time    08/01/14 10:17 AM      Result Value Ref Range Status   Technologist Review 2% Metas and 2% Myelocytes present   Final  TECHNOLOGIST REVIEW     Status: None   Collection Time    08/06/14  3:15 PM      Result Value Ref Range Status   Technologist Review   1 %nRBC and rare myelocyte present   Final         Studies: No results found.      Scheduled Meds: . aspirin EC  81 mg Oral Daily  . cholecalciferol  1,000 Units Oral Daily  . dasatinib  50 mg Oral QHS  . docusate sodium  100 mg Oral BID  . enoxaparin (LOVENOX) injection  40 mg Subcutaneous Q24H  . insulin aspart  0-9 Units Subcutaneous TID WC  . insulin glargine  15 Units Subcutaneous QHS  . magic mouthwash w/lidocaine  5 mL Oral QID  . morphine  30 mg Oral Q12H  . polyethylene glycol  17 g Oral BID  . senna  2 tablet Oral Daily  . simvastatin  20 mg Oral QPM  . vitamin B-12  1,000 mcg Oral Daily   Continuous Infusions: . sodium chloride 0.9 % 1,000 mL with potassium chloride 20 mEq infusion 10 mL/hr at 08/08/14 0849    Active Problems:   Dehydration   Generalized weakness    Time spent: 30 minutes    Jerae Izard, MD, FACP, FHM. [facility] Pager [phone]  If 7PM-7AM, please contact night-coverage [email or web address] Password [email or web address] 08/08/2014, 1:24 PM    LOS: 1 day

## 2014-08-08 NOTE — Care Management Note (Signed)
CARE MANAGEMENT NOTE 08/08/2014  Patient:  Evan [name], Evan [name]   Account Number:  [id number]  Date Initiated:  08/08/2014  Documentation initiated by:  Leafy Kindle  Subjective/Objective Assessment:   73 yo admitted with dehydration.  Hx of pancreatic CA     Action/Plan:   From home with wife.   Anticipated DC Date:  08/10/2014   Anticipated DC Plan:  [place]  CM consult      Choice offered to / List presented to:  C-3 Spouse   DME arranged  Vassie Moselle      DME agency  [place] arranged  [place].   Status of service:  In process, will continue to follow Medicare Important Message given?   (If response is "NO", the following Medicare IM given date fields will be blank) Date Medicare IM given:   Medicare IM given by:   Date Additional Medicare IM given:   Additional Medicare IM given by:    Discharge Disposition:    Per UR Regulation:  Reviewed for med. necessity/level of care/duration of stay  If discussed at [place] of Stay Meetings, dates discussed:    Comments:  08/08/14 Marney Doctor RN,BSN,NCM [phone] PT is recommending HHPT and RW.  Pt and wife offered choice.  Wife used [facility] with dad years ago and would like to use them again.  [facility] rep Erasmo Downer informed of referral.  [facility] D[facility] rep contacted about RW.  CM will continue to follow.

## 2014-08-08 NOTE — Progress Notes (Signed)
INITIAL NUTRITION ASSESSMENT  DOCUMENTATION CODES Per approved criteria  -Severe malnutrition in the context of chronic illness  Pt meets criteria for severe MALNUTRITION in the context of chronic illness as evidenced by PO intake > 75% for one month, 11% body weight loss in 3 months.   INTERVENTION: -Recommend MagicCup BID w/meals  -Modify to Boost or Ensure as warranted/tolerated -Encouraged bland, easy to tolerate foods  -RD to continue to monitor  NUTRITION DIAGNOSIS: Inadequate oral intake related to nausea/early satiety as evidenced by PO intake < 75%, 25 lb wt loss in 3 months.   Goal: Pt to meet >/= 90% of their estimated nutrition needs    Monitor:  Total protein/energy intake, labs, weight, GI profile, supplement tolerance  Reason for Assessment: MST/Consult to Assess  73 y.o. male  Admitting Dx: <principal problem not specified>  ASSESSMENT: 73 year old male with a history of diabetes mellitus, CML, hypertension, and newly diagnosed pancreatic adenocarcinoma (July 2015) presents with 2 weeks of increasing generalized weakness and lethargy  -Pt's wife reported decreased appetite for past 5 weeks since beginning chemo treatments; however has significantly worsened in past 3 weeks -Diet recall indicated pt consuming minimal intake of solid foods, wife has encouraged bland foods, such as crackers, and toast; however pt will typically eat <50%. Has been eating popsicles, gatorade, lemonade, coffee, and hot chocolate -Has been followed by outpatient oncology RD. Was provided nutrition education regarding nausea, and given nutrition supplement samples. Pt has been refusing Glucerna shakes per wife report. Did not try Ensure/Boost d/t hx of DM2 -Appetite improving, was able to consume 50% of breakfast. Receiving IVF d/t dehydration. Encouraged intake of Ensure or Boost as pt with minimal CHO intake of meals, and would benefit from additional nutrients. Wife verbalized  understanding in importance of supplements; however believed that pt would continue to refuse -Offered MagicCup BID w/meals, which wife noted pt may be more agreeable to. Will trial and RD to follow for tolerance -Endorsed weight loss. Has experienced 25 lbs in past 3 months (11% body weight loss, severe for time frame) -MD also noted pt with constipation, which is also likely contributing to decreased appetite and early satiety.  Height: Ht Readings from Last 1 Encounters:  08/07/14 6\' 2"  (1.88 m)    Weight: Wt Readings from Last 1 Encounters:  08/07/14 226 lb 10.1 oz (102.8 kg)    Ideal Body Weight: 190 lbs  % Ideal Body Weight: 119%  Wt Readings from Last 10 Encounters:  08/07/14 226 lb 10.1 oz (102.8 kg)  08/06/14 226 lb (102.513 kg)  08/01/14 231 lb 9.6 oz (105.053 kg)  07/26/14 235 lb 4.8 oz (106.731 kg)  07/24/14 230 lb 8 oz (104.554 kg)  07/20/14 233 lb 4.8 oz (105.824 kg)  07/16/14 235 lb 8 oz (106.822 kg)  07/13/14 232 lb 14.4 oz (105.643 kg)  07/09/14 234 lb 14.4 oz (106.55 kg)  07/06/14 236 lb 1.6 oz (107.094 kg)    Usual Body Weight: 250 lb  % Usual Body Weight: 90%  BMI:  Body mass index is 29.09 kg/(m^2).  Estimated Nutritional Needs: Kcal: 7846-9629 Protein: 105-120 gram Fluid: >/=2300 ml daily  Skin: WDL  Diet Order: General  EDUCATION NEEDS: -Education needs addressed   Intake/Output Summary (Last 24 hours) at 08/08/14 1309 Last data filed at 08/08/14 0416  Gross per 24 hour  Intake 1042.6 ml  Output     50 ml  Net  992.6 ml    Last BM: 9/19   Labs:  Recent Labs Lab 08/06/14 1515 08/07/14 1124 08/08/14 0425  NA 138 137 140  K 3.7 3.7 3.7  CL  --  101 105  CO2 25 24 24   BUN 12.9 14 10   CREATININE 1.0 1.06 0.88  CALCIUM 9.2 8.7 8.3*  GLUCOSE 159* 128* 66*    CBG (last 3)   Recent Labs  08/08/14 0737 08/08/14 0803 08/08/14 1117  GLUCAP 56* 63* 155*    Scheduled Meds: . aspirin EC  81 mg Oral Daily  .  cholecalciferol  1,000 Units Oral Daily  . dasatinib  50 mg Oral QHS  . docusate sodium  100 mg Oral BID  . enoxaparin (LOVENOX) injection  40 mg Subcutaneous Q24H  . insulin aspart  0-9 Units Subcutaneous TID WC  . insulin glargine  15 Units Subcutaneous QHS  . magic mouthwash w/lidocaine  5 mL Oral QID  . morphine  30 mg Oral Q12H  . polyethylene glycol  17 g Oral BID  . senna  2 tablet Oral Daily  . simvastatin  20 mg Oral QPM  . vitamin B-12  1,000 mcg Oral Daily    Continuous Infusions: . sodium chloride 0.9 % 1,000 mL with potassium chloride 20 mEq infusion 10 mL/hr at 08/08/14 9485    Past Medical History  Diagnosis Date  . Chronic back pain greater than 3 months duration   . Bulging discs   . Diabetes mellitus     type II; neuropathy;   . Hypertension   . High cholesterol   . Leukocytosis   . GERD (gastroesophageal reflux disease)   . Pancreatitis 2004  . Anxiety   . DDD (degenerative disc disease) 2005    lumbar spine  . History of smoking 08/01/2012  . Weight loss 08/01/2012  . Cough 08/01/2012  . 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 Left leg pain 08/05/2012  . 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 Chronic pancreatitis   . Pancreatic cancer 06/08/2014  . OSA (obstructive sleep apnea) 2010    does not use CPAP  . Nausea alone 06/13/2014  . Allergy   . Mucositis (ulcerative) due to antineoplastic therapy 07/02/2014  . Constipated 07/16/2014    Past Surgical History  Procedure Laterality Date  . Cholecystectomy      laparoscopic  . Hernia repair      umbilical  . Skin cancer removed  2012    right ear, basal cell carcinoma.   Marland Kitchen Spinal stretching    . Colonoscopy  2012    [facility]  . Eus Left 06/07/2014    Procedure: UPPER ENDOSCOPIC ULTRASOUND (EUS) LINEAR;  Surgeon: Arta Silence, MD;  Location: [facility] END[facility]COPY;  Service: Endoscopy;  Laterality: Left;  . Ercp N/A 06/07/2014    Procedure: ENDOSCOPIC RETROGRADE CHOLANGIOPANCREATOGRAPHY (ERCP);  Surgeon: Arta Silence, MD;  Location: [facility];  Service: Endoscopy;  Laterality: N/A;  . Eye surgery      bilateral cataracts with lens implant  . Cataract extraction Bilateral   . Port acath placement Left 06/14/14  . Portacath placement Left 06/14/2014    Procedure: INSERTION PORT-A-CATH;  Surgeon: Stark Klein, MD;  Location: [facility] ORS;  Service: General;  Laterality: Left;    Atlee Abide MS RD LDN Clinical Dietitian IOEVO:[phone]

## 2014-08-08 NOTE — Progress Notes (Signed)
Evan [name]   DOB:15-Mar-1941   GE#:[id number]    Subjective: He feels better. He tolerate oral intake better. He feels a bit stronger. He still has mild persistent epigastric discomfort.  Objective:  Filed Vitals:   08/08/14 0415  BP: 130/76  Pulse: 91  Temp: 98.3 F (36.8 C)  Resp: 16     Intake/Output Summary (Last 24 hours) at 08/08/14 1502 Last data filed at 08/08/14 0416  Gross per 24 hour  Intake 1042.6 ml  Output     50 ml  Net  992.6 ml    GENERAL:alert, no distress and comfortable SKIN: skin color, texture, turgor are normal, no rashes or significant lesions EYES: normal, Conjunctiva are pink and non-injected, sclera clear OROPHARYNX:no exudate, no erythema and lips, buccal mucosa, and tongue normal  NECK: supple, thyroid normal size, non-tender, without nodularity LYMPH:  no palpable lymphadenopathy in the cervical, axillary or inguinal LUNGS: clear to auscultation and percussion with normal breathing effort HEART: regular rate & rhythm and no murmurs and no lower extremity edema ABDOMEN:abdomen soft, non-tender and normal bowel sounds Musculoskeletal:no cyanosis of digits and no clubbing  NEURO: alert & oriented x 3 with fluent speech, no focal motor/sensory deficits   Labs:  Lab Results  Component Value Date   WBC 6.1 08/08/2014   HGB 8.9* 08/08/2014   HCT 25.9* 08/08/2014   MCV 99.6 08/08/2014   PLT 146* 08/08/2014   NEUTROABS 2.7 08/07/2014    Lab Results  Component Value Date   NA 140 08/08/2014   K 3.7 08/08/2014   CL 105 08/08/2014   CO2 24 08/08/2014   Assessment & Plan:  Malignant neoplasm of head of pancreas  He is starting to experience more side effects from treatment. He had completed his neoadjuvant chemoradiation therapy. Continue aggressive supportive care.  CML in remission  The patient has been in remission for the past year.  He will continue taking his chemotherapy through all this treatment  Nausea alone  I suspect that might be an  element of dehydration. I recommend daily IV fluids and IV anti emetics as needed. Recommend aggressive laxatives to prevent constipation as this may contribute to his nausea  Anemia in neoplastic disease  This is likely due to recent treatment. The patient denies recent history of bleeding such as epistaxis, hematuria or hematochezia. [name] I will observe for now.  Constipated  This is related to pain medicine. I recommend he takes laxatives regularly.   Hopefully, the patient can be discharged within the next 24-48 hours. I have appointment to see him back in my clinic next week.  [facility], [place], MD 08/08/2014  3:02 PM

## 2014-08-08 NOTE — Evaluation (Signed)
Physical Therapy Evaluation Patient Details Name: Evan [name] MRN: [id number] DOB: 25-Mar-1941 Today's Date: 08/08/2014   History of Present Illness  73 year old male with a history of diabetes mellitus, CML, hypertension, and newly diagnosed pancreatic adenocarcinoma (July 2015) presents with 2 weeks of increasing generalized weakness and lethargy.  Pt recently completed chemotherapy and radiation treatments.  Clinical Impression  *Pt admitted with weakness, dehydration**. Pt currently with functional limitations due to the deficits listed below (see PT Problem List).  Pt will benefit from skilled PT to increase their independence and safety with mobility to allow discharge to the venue listed below.   Pt ambulated 220' with RW without LOB. He would like to work towards walking without an assistive device. Will follow during acute stay. HHPT recommended.   **    Follow Up Recommendations Home health PT    Equipment Recommendations  Rolling walker with 5" wheels    Recommendations for Other Services       Precautions / Restrictions        Mobility  Bed Mobility Overal bed mobility: Modified Independent             General bed mobility comments: using rail  Transfers Overall transfer level: Needs assistance Equipment used: Rolling walker (2 wheeled) Transfers: Sit to/from Stand Sit to Stand: Supervision         General transfer comment: cues for hand placement  Ambulation/Gait Ambulation/Gait assistance: Min guard Ambulation Distance (Feet): 220 Feet Assistive device: Rolling walker (2 wheeled) Gait Pattern/deviations: WFL(Within Functional Limits)     General Gait Details: steady with RW  Stairs            Wheelchair Mobility    Modified Rankin (Stroke Patients Only)       Balance Overall balance assessment: Needs assistance   Sitting balance-Leahy Scale: Good       Standing balance-Leahy Scale: Fair                                Pertinent Vitals/Pain Pain Assessment: No/denies pain    Home Living Family/patient expects to be discharged to:: Private residence Living Arrangements: Spouse/significant other Available Help at Discharge: Family Type of Home: House Home Access: Stairs to enter Entrance Stairs-Rails: Left;Right;Can reach both Technical brewer of Steps: 8 Home Layout: One level Home Equipment: Cane - single point      Prior Function Level of Independence: Independent               Hand Dominance        Extremity/Trunk Assessment   Upper Extremity Assessment: Overall WFL for tasks assessed           Lower Extremity Assessment: Overall WFL for tasks assessed      Cervical / Trunk Assessment: Normal  Communication   Communication: No difficulties  Cognition Arousal/Alertness: Awake/alert Behavior During Therapy: WFL for tasks assessed/performed Overall Cognitive Status: Within Functional Limits for tasks assessed                      General Comments      Exercises        Assessment/Plan    PT Assessment Patient needs continued PT services  PT Diagnosis     PT Problem List Decreased activity tolerance;Decreased balance;Decreased mobility  PT Treatment Interventions     PT Goals (Current goals can be found in the Care Plan section) Acute Rehab PT Goals Patient  Stated Goal: to walk without an assistive device, get stronger to prepare for upcoming surgery PT Goal Formulation: With patient/family Time For Goal Achievement: 08/22/14 Potential to Achieve Goals: Good    Frequency Min 3X/week   Barriers to discharge        Co-evaluation               End of Session Equipment Utilized During Treatment: Gait belt Activity Tolerance: Patient tolerated treatment well Patient left: in bed;with call bell/phone within reach;with bed alarm set;with family/visitor present Nurse Communication: Mobility status         Time:  1400-1417 PT Time Calculation (min): 17 min   Charges:   PT Evaluation $Initial PT Evaluation Tier I: 1 Procedure PT Treatments $Gait Training: 8-22 mins   PT G Codes:          Philomena Doheny 08/08/2014, 2:29 PM [phone]

## 2014-08-08 NOTE — Progress Notes (Signed)
VASCULAR LAB PRELIMINARY  PRELIMINARY  PRELIMINARY  PRELIMINARY  Bilateral lower extremity venous duplex   completed.    Preliminary report:  Bilateral:  No evidence of DVT, superficial thrombosis, or Baker's Cyst.    Bransyn Adami, RVT 08/08/2014, 12:43 PM

## 2014-08-08 NOTE — Progress Notes (Signed)
PROGRESS NOTE    Evan [name] PPI:[id number] DOB: 1940/12/13 DOA: 08/07/2014 PCP: [name], MD  HPI/Brief narrative 73 year old male with history of DM 2, CML, HTN, newly diagnosed pancreatic adenocarcinoma presented to ED with two-week history of increasing generalized weakness, lethargy, intermittent dry heaves and constipation. Patient has epigastric pain and taking MS Contin and Dilaudid at home. In the ED, BMP, LFTs unremarkable. CBC showed that WBC 2.5, hemoglobin 9. Lactate normal.  Assessment/Plan:  1. Generalized weakness/failure to thrive: Multifactorial from chemotherapy, dehydration, opioids and possibly hypoglycemia. UA negative. TSH: Normal. Follow a.m. cortisol. PT evaluation. 2. Epigastric pain: Likely secondary to pancreatic cancer and biliary stent. No acute abdominal signs. Troponins x3 negative. 3. Type II DM with hypoglycemia: Lantus was reduced to half the home dose last night despite which patient had CBG of 56 this morning. Reduce Lantus further to 15 units each bedtime and DC bedtime SSI. DC Saxaglipitin. Monitor closely. Continue regular diet. 4. Stage II pancreatic adenocarcinoma: Admitting M.D. has informed primary oncologist of patient's admission. s/p chemotherapy and radiation. 5. Hypertension: Controlled off medications. 6. Chronic pain: For now continue home dose of MS Contin and oral Dilaudid-may have to reduce some of these medications if lethargy and somnolence becomes an issue. 7. 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 Continue dasatinib. 8. Constipation: Bowel regimen. 9. Lower extremity edema and pain: Venous Dopplers negative for DVT. 10. Anemia: Secondary to chronic disease, cancer and cancer treatment.   Code Status: Full Family Communication: Discussed with spouse - looks better per her.   Disposition Plan: Home when medically stable   Consultants:  None  Procedures:  None  Antibiotics:  None   Subjective: Denies  complaints. Denies pain. As per nursing, no acute events.  Objective: Filed Vitals:   08/07/14 1520 08/07/14 2201 08/08/14 0415 08/08/14 1446  BP: 130/71 145/72 130/76 142/74  Pulse: 82 84 91 86  Temp: 98.5 F (36.9 C) 98.4 F (36.9 C) 98.3 F (36.8 C) 98.1 F (36.7 C)  TempSrc: Oral Oral Oral Oral  Resp: 16 18 16 17   Height: 6\' 2"  (1.88 m)     Weight: 102.8 kg (226 lb 10.1 oz)     SpO2: 98% 96% 93% 94%    Intake/Output Summary (Last 24 hours) at 08/08/14 1801 Last data filed at 08/08/14 1508  Gross per 24 hour  Intake 1302.6 ml  Output     50 ml  Net 1252.6 ml   Filed Weights   08/07/14 1520  Weight: 102.8 kg (226 lb 10.1 oz)     Exam:  General exam: Pleasant elderly male sitting up comfortably propped up in bed. Respiratory system: Clear. No increased work of breathing. Cardiovascular system: S1 & S2 heard, RRR. No JVD, murmurs, gallops, clicks. Trace bilateral ankle edema. Gastrointestinal system: Abdomen is nondistended, soft and nontender. Normal bowel sounds heard. Central nervous system: Alert and oriented. No focal neurological deficits. Extremities: Symmetric 5 x 5 power.   Data Reviewed: Basic Metabolic Panel:  Recent Labs Lab 08/06/14 1515 08/07/14 1124 08/08/14 0425  NA 138 137 140  K 3.7 3.7 3.7  CL  --  101 105  CO2 25 24 24   GLUCOSE 159* 128* 66*  BUN 12.9 14 10   CREATININE 1.0 1.06 0.88  CALCIUM 9.2 8.7 8.3*   Liver Function Tests:  Recent Labs Lab 08/06/14 1515 08/07/14 1124  AST 25 29  ALT 21 21  ALKPHOS 179* 185*  BILITOT 0.66 0.7  PROT 6.4 6.1  ALBUMIN 3.0* 3.0*   No results found for this basename: LIPASE, AMYLASE,  in the last 168 hours No results found for this basename: AMMONIA,  in the last 168 hours CBC:  Recent Labs Lab 08/06/14 1515 08/07/14 1124 08/08/14 0425  WBC 2.8* 3.5* 6.1  NEUTROABS 2.2 2.7  --   HGB 9.5* 9.0* 8.9*  HCT 27.6* 25.7* 25.9*  MCV 97.5 98.1 99.6  PLT 195 162 146*   Cardiac  Enzymes:  Recent Labs Lab 08/07/14 1555 08/07/14 2039 08/08/14 0425  CKTOTAL 72  --   --   TROPONINI <0.30 <0.30 <0.30   BNP (last 3 results) No results found for this basename: PROBNP,  in the last 8760 hours CBG:  Recent Labs Lab 08/07/14 1728 08/07/14 2205 08/08/14 0737 08/08/14 0803 08/08/14 1117  GLUCAP 79 101* 56* 63* 155*    Recent Results (from the past 240 hour(s))  TECHNOLOGIST REVIEW     Status: None   Collection Time    08/01/14 10:17 AM      Result Value Ref Range Status   Technologist Review 2% Metas and 2% Myelocytes present   Final  TECHNOLOGIST REVIEW     Status: None   Collection Time    08/06/14  3:15 PM      Result Value Ref Range Status   Technologist Review   1 %nRBC and rare myelocyte present   Final         Studies: No results found.      Scheduled Meds: . aspirin EC  81 mg Oral Daily  . cholecalciferol  1,000 Units Oral Daily  . dasatinib  50 mg Oral QHS  . docusate sodium  100 mg Oral BID  . enoxaparin (LOVENOX) injection  40 mg Subcutaneous Q24H  . insulin aspart  0-9 Units Subcutaneous TID WC  . insulin glargine  15 Units Subcutaneous QHS  . magic mouthwash w/lidocaine  5 mL Oral QID  . morphine  30 mg Oral Q12H  . polyethylene glycol  17 g Oral BID  . senna  2 tablet Oral Daily  . simvastatin  20 mg Oral QPM  . vitamin B-12  1,000 mcg Oral Daily   Continuous Infusions: . sodium chloride 0.9 % 1,000 mL with potassium chloride 20 mEq infusion 10 mL/hr at 08/08/14 1529    Active Problems:   Hypertension   Malignant neoplasm of head of pancreas   Dehydration   Generalized weakness   Failure to thrive in adult   Diabetes mellitus with hypoglycemia   Protein-calorie malnutrition, severe    Time spent: 75 minutes    Jenny Omdahl, MD, FACP, FHM. [facility] Pager [phone]  If 7PM-7AM, please contact night-coverage [email or web address] Password [email or web address] 08/08/2014, 6:01 PM    LOS: 1 day

## 2014-08-09 ENCOUNTER — Ambulatory Visit: Payer: Medicare Other

## 2014-08-09 LAB — URINE CULTURE: Colony Count: 9000

## 2014-08-09 LAB — URINALYSIS, ROUTINE W REFLEX MICROSCOPIC
Glucose, UA: NEGATIVE mg/dL
Hgb urine dipstick: NEGATIVE
Ketones, ur: 15 mg/dL — AB
LEUKOCYTES UA: NEGATIVE
NITRITE: NEGATIVE
PH: 5 (ref 5.0–8.0)
Protein, ur: NEGATIVE mg/dL
Specific Gravity, Urine: 1.017 (ref 1.005–1.030)
UROBILINOGEN UA: 1 mg/dL (ref 0.0–1.0)

## 2014-08-09 LAB — GLUCOSE, CAPILLARY
GLUCOSE-CAPILLARY: 100 mg/dL — AB (ref 70–99)
Glucose-Capillary: 97 mg/dL (ref 70–99)
Glucose-Capillary: 96 mg/dL (ref 70–99)

## 2014-08-09 LAB — CORTISOL-AM, BLOOD: Cortisol - AM: 15.9 ug/dL (ref 4.3–22.4)

## 2014-08-09 MED ORDER — INSULIN GLARGINE 100 UNIT/ML ~~LOC~~ SOLN: 15.0000 [IU] | Freq: Every day | SUBCUTANEOUS | Status: DC

## 2014-08-09 MED ORDER — DSS 100 MG PO CAPS: 100.0000 mg | ORAL_CAPSULE | Freq: Two times a day (BID) | ORAL | Status: DC

## 2014-08-09 MED ORDER — POLYETHYLENE GLYCOL 3350 17 G PO PACK: 17.0000 g | PACK | Freq: Two times a day (BID) | ORAL | Status: DC

## 2014-08-09 MED ORDER — HEPARIN SOD (PORK) LOCK FLUSH 100 UNIT/ML IV SOLN
500.0000 [IU] | INTRAVENOUS | Status: DC | PRN
  Filled 2014-08-09: qty 5

## 2014-08-09 NOTE — Progress Notes (Signed)
Patient was stable at time of discharge. Port deaccessed with saline and heparin flushes. I reviewed discharge education with patient's wife. She verbalized understanding and did not have further questions. Patient's home medication was returned to patient before he left.

## 2014-08-09 NOTE — Progress Notes (Signed)
[facility] is providing the following services: RW  If patient discharges after hours, please call [phone].   Evan [name] 08/09/2014, 3:42 PM

## 2014-08-09 NOTE — Discharge Summary (Signed)
Physician Discharge Summary  Evan [name] BTD:[id number] DOB: 1940/12/06 DOA: 08/07/2014  PCP: [name], MD  Admit date: 08/07/2014 Discharge date: 08/09/2014  Time spent: Less than 30 minutes  Recommendations for Outpatient Follow-up:  1. Dr. Lavone Orn, PCP: Keep previous appointment on 08/14/14 for annual physical exam. Patient will need a repeat labs (CBC) which can be done either during this visit or during visit with oncology on 9/28. 2. Dr. [name], Oncology on 9/28 at 2:15 PM.  Discharge Diagnoses:  Active Problems:   Hypertension   Malignant neoplasm of head of pancreas   Dehydration   Generalized weakness   Failure to thrive in adult   Diabetes mellitus with hypoglycemia   Protein-calorie malnutrition, severe   Discharge Condition: Improved & Stable  Diet recommendation: Heart healthy and diabetic diet.  Filed Weights   08/07/14 1520  Weight: 102.8 kg (226 lb 10.1 oz)    History of present illness:  73 year old male with history of DM 2, CML, HTN, newly diagnosed pancreatic adenocarcinoma presented to ED with two-week history of increasing generalized weakness, lethargy, intermittent dry heaves and constipation. Patient has epigastric pain and taking MS Contin and Dilaudid at home. In the ED, BMP, LFTs unremarkable. CBC showed that WBC 2.5, hemoglobin 9. Lactate normal.  Hospital Course:   1. Generalized weakness/failure to thrive: Multifactorial from chemotherapy, dehydration, opioids and possibly hypoglycemia. UA negative. TSH: Normal. A.m. cortisol: 15.9. PT recommends home health PT and rolling walker. Improved. 2. Epigastric pain & nausea: Likely secondary to pancreatic cancer and biliary stent made worse by constipation. No acute abdominal signs. Troponins x3 negative. Improved. 3. Type II DM with hypoglycemia: Due to hypoglycemia, Lantus was reduced from 42 units >15 units at bedtime. The CBGs are tightly controlled but expect to rise with  home diet. Patient is also on metformin and Saxaglipitin at home. Spouse was counseled extensively regarding checking CBGs 4 times daily before meals and at bedtime & recording same. If CBGs are low then to reduce insulins or hold. Advised her to take the log to PCP visit on 9/29 for further management instructions. She verbalized understanding. 4. 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 He has completed his neoadjuvant chemoradiation therapy. 5. Hypertension: Antihypertensives were held in the hospital secondary to initial soft blood pressures which have now normalized. Resumed at discharge. 6. Chronic pain: For now continue home dose of MS Contin and oral Dilaudid-may have to reduce some of these medications if lethargy and somnolence becomes an issue. 7. 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 Continue dasatinib. 8. Constipation: Aggressive bowel regimen initiated. Patient had a large BM last night. Consider taper of narcotics as tolerated. 9. Lower extremity edema and pain: Venous Dopplers negative for DVT. 10. Anemia: Secondary to chronic disease, cancer and cancer treatment. Outpatient followup.    Consultations:  Oncology  Procedures:  None    Discharge Exam:  Complaints:  Patient had a large BM last night. Denied abdominal pain this morning. Mild nausea. Tolerating diet. Denies any other complaints.  Filed Vitals:   08/08/14 0415 08/08/14 1446 08/08/14 2156 08/09/14 0523  BP: 130/76 142/74 142/64 113/66  Pulse: 91 86 79 78  Temp: 98.3 F (36.8 C) 98.1 F (36.7 C) 98.1 F (36.7 C) 97.7 F (36.5 C)  TempSrc: Oral Oral Oral Oral  Resp: 16 17 16 16   Height:  Weight:      SpO2: 93% 94% 94% 92%   General exam: Pleasant elderly male sitting up comfortably propped up in bed.  Respiratory system: Clear. No increased work of breathing.   Cardiovascular system: S1 & S2 heard, RRR. No JVD, murmurs, gallops, clicks. Trace bilateral ankle edema.  Gastrointestinal system: Abdomen is nondistended, soft and nontender. Normal bowel sounds heard.  Central nervous system: Alert and oriented. No focal neurological deficits.  Extremities: Symmetric 5 x 5 power.   Discharge Instructions      Discharge Instructions   Call MD for:  persistant nausea and vomiting    Complete by:  As directed      Call MD for:  severe uncontrolled pain    Complete by:  As directed      Diet - low sodium heart healthy    Complete by:  As directed      Diet Carb Modified    Complete by:  As directed      Increase activity slowly    Complete by:  As directed             Medication List    STOP taking these medications       amoxicillin-clavulanate 875-125 MG per tablet  Commonly known as:  AUGMENTIN      TAKE these medications       acetaminophen 325 MG tablet  Commonly known as:  TYLENOL  Take 650 mg by mouth every 6 (six) hours as needed for moderate pain (low grade fever).     aspirin EC 81 MG tablet  Take 81 mg by mouth daily.     cholecalciferol 1000 UNITS tablet  Commonly known as:  VITAMIN D  Take 1,000 Units by mouth daily.     dasatinib 50 MG tablet  Commonly known as:  SPRYCEL  Take 50 mg by mouth at bedtime.     diphenhydrAMINE 25 mg capsule  Commonly known as:  BENADRYL  Take 25 mg by mouth every 6 (six) hours as needed for itching (itching).     DSS 100 MG Caps  Take 100 mg by mouth 2 (two) times daily.     HYDROmorphone 4 MG tablet  Commonly known as:  DILAUDID  Take 4 mg by mouth every 4 (four) hours as needed for severe pain (pain).     insulin glargine 100 UNIT/ML injection  Commonly known as:  LANTUS  Inject 0.15 mLs (15 Units total) into the skin at bedtime.     lactulose 10 GM/15ML solution  Commonly known as:  CHRONULAC  Take 15 mLs by mouth as needed for severe constipation (constipation). Hasn't  used as yet,     lidocaine-prilocaine cream  Commonly known as:  EMLA  Apply 1 application topically as needed (pain).     losartan 100 MG tablet  Commonly known as:  COZAAR  Take 100 mg by mouth every morning.     magic mouthwash w/lidocaine Soln  Take 5 mLs by mouth 4 (four) times daily.     metFORMIN 1000 MG tablet  Commonly known as:  GLUCOPHAGE  Take 1,000 mg by mouth 2 (two) times daily with a meal.     morphine 30 MG 12 hr tablet  Commonly known as:  MS CONTIN  Take 1 tablet (30 mg total) by mouth every 12 (twelve) hours.     ONGLYZA 5 MG Tabs tablet  Generic drug:  saxagliptin HCl  Take 5 mg by mouth daily.  polyethylene glycol packet  Commonly known as:  MIRALAX / GLYCOLAX  Take 17 g by mouth 2 (two) times daily.     promethazine 25 MG tablet  Commonly known as:  PHENERGAN  Take 25 mg by mouth every 6 (six) hours as needed for nausea or vomiting (nausea).     senna 8.6 MG tablet  Commonly known as:  SENOKOT  Take 2 tablets by mouth daily.     simvastatin 20 MG tablet  Commonly known as:  ZOCOR  Take 20 mg by mouth every evening.     SUPER B COMPLEX/VITAMIN C PO  Take 1 tablet by mouth daily.     vitamin B-12 1000 MCG tablet  Commonly known as:  CYANOCOBALAMIN  Take 1,000 mcg by mouth daily.       Follow-up Information   Follow up with [name], MD On 08/14/2014. (Keep a prior appointment for annual physical. Please discuss diabetes management.)    Specialty:  Internal Medicine   Contact information:   [address], [address] [place] [address] [phone]       Follow up with [facility], NI, MD On 08/13/2014. (2:30 PM.)    Specialty:  Hematology and Oncology   Contact information:   [place] [address] [phone]        The results of significant diagnostics from this hospitalization (including imaging, microbiology, ancillary and laboratory) are listed below for reference.    Significant  Diagnostic Studies: No results found.  Microbiology: Recent Results (from the past 240 hour(s))  TECHNOLOGIST REVIEW     Status: None   Collection Time    08/01/14 10:17 AM      Result Value Ref Range Status   Technologist Review 2% Metas and 2% Myelocytes present   Final  TECHNOLOGIST REVIEW     Status: None   Collection Time    08/06/14  3:15 PM      Result Value Ref Range Status   Technologist Review   1 %nRBC and rare myelocyte present   Final  URINE CULTURE     Status: None   Collection Time    08/08/14  4:20 AM      Result Value Ref Range Status   Specimen Description URINE, CLEAN CATCH   Final   Special Requests NONE   Final   Culture  Setup Time     Final   Value: 08/08/2014 09:59     Performed at [facility]     Final   Value: 9,000 COLONIES/ML     Performed at [facility]   Culture     Final   Value: INSIGNIFICANT GROWTH     Performed at [facility]   Report Status 08/09/2014 FINAL   Final     Labs: Basic Metabolic Panel:  Recent Labs Lab 08/06/14 1515 08/07/14 1124 08/08/14 0425  NA 138 137 140  K 3.7 3.7 3.7  CL  --  101 105  CO2 25 24 24   GLUCOSE 159* 128* 66*  BUN 12.9 14 10   CREATININE 1.0 1.06 0.88  CALCIUM 9.2 8.7 8.3*   Liver Function Tests:  Recent Labs Lab 08/06/14 1515 08/07/14 1124  AST 25 29  ALT 21 21  ALKPHOS 179* 185*  BILITOT 0.66 0.7  PROT 6.4 6.1  ALBUMIN 3.0* 3.0*   No results found for this basename: LIPASE, AMYLASE,  in the last 168 hours No results found for this  basename: AMMONIA,  in the last 168 hours CBC:  Recent Labs Lab 08/06/14 1515 08/07/14 1124 08/08/14 0425  WBC 2.8* 3.5* 6.1  NEUTROABS 2.2 2.7  --   HGB 9.5* 9.0* 8.9*  HCT 27.6* 25.7* 25.9*  MCV 97.5 98.1 99.6  PLT 195 162 146*   Cardiac Enzymes:  Recent Labs Lab 08/07/14 1555 08/07/14 2039 08/08/14 0425  CKTOTAL 72  --   --   TROPONINI <0.30 <0.30 <0.30   BNP: BNP (last 3 results) No results  found for this basename: PROBNP,  in the last 8760 hours CBG:  Recent Labs Lab 08/08/14 1117 08/08/14 1801 08/08/14 2154 08/09/14 0721 08/09/14 1142  GLUCAP 155* 100* 76 97 96       Signed:  Oshay Stranahan, MD, FACP, FHM. [facility] Pager [phone]  If 7PM-7AM, please contact night-coverage [email or web address] Password [facility] 08/09/2014, 3:23 PM

## 2014-08-10 ENCOUNTER — Telehealth (INDEPENDENT_AMBULATORY_CARE_PROVIDER_SITE_OTHER): Payer: Self-pay

## 2014-08-10 ENCOUNTER — Ambulatory Visit: Payer: Medicare Other

## 2014-08-10 ENCOUNTER — Encounter (INDEPENDENT_AMBULATORY_CARE_PROVIDER_SITE_OTHER): Payer: Medicare Other | Admitting: General Surgery

## 2014-08-10 ENCOUNTER — Other Ambulatory Visit (INDEPENDENT_AMBULATORY_CARE_PROVIDER_SITE_OTHER): Payer: Self-pay | Admitting: General Surgery

## 2014-08-10 DIAGNOSIS — C250 Malignant neoplasm of head of pancreas: Secondary | ICD-10-CM

## 2014-08-10 NOTE — Telephone Encounter (Signed)
Confirmed pt's CT scan on 08/22/14.  Pt will go for labs on 08/20/14.  Labs will be faxed to pt's PCP, Dr. Lavone Orn.

## 2014-08-13 ENCOUNTER — Telehealth: Payer: Self-pay | Admitting: *Deleted

## 2014-08-13 ENCOUNTER — Encounter: Payer: Medicare Other | Admitting: Hematology and Oncology

## 2014-08-13 ENCOUNTER — Other Ambulatory Visit (HOSPITAL_BASED_OUTPATIENT_CLINIC_OR_DEPARTMENT_OTHER): Payer: Medicare Other

## 2014-08-13 ENCOUNTER — Encounter (HOSPITAL_COMMUNITY): Payer: Self-pay

## 2014-08-13 ENCOUNTER — Ambulatory Visit: Payer: Medicare Other

## 2014-08-13 ENCOUNTER — Ambulatory Visit (HOSPITAL_BASED_OUTPATIENT_CLINIC_OR_DEPARTMENT_OTHER): Payer: Medicare Other

## 2014-08-13 ENCOUNTER — Inpatient Hospital Stay (HOSPITAL_COMMUNITY)
Admission: AD | Admit: 2014-08-13 | Discharge: 2014-08-23 | DRG: 384 | Disposition: A | Discharge status: Home Health | Payer: Medicare Other | Source: Ambulatory Visit | Attending: Hematology and Oncology | Admitting: Hematology and Oncology

## 2014-08-13 ENCOUNTER — Inpatient Hospital Stay (HOSPITAL_COMMUNITY): Payer: Medicare Other

## 2014-08-13 VITALS — BP 156/67 | HR 98 | Temp 98.6°F | Resp 19 | Ht 74.0 in | Wt 223.2 lb

## 2014-08-13 DIAGNOSIS — R627 Adult failure to thrive: Secondary | ICD-10-CM | POA: Diagnosis present

## 2014-08-13 DIAGNOSIS — B3781 Candidal esophagitis: Secondary | ICD-10-CM | POA: Diagnosis present

## 2014-08-13 DIAGNOSIS — K269 Duodenal ulcer, unspecified as acute or chronic, without hemorrhage or perforation: Secondary | ICD-10-CM | POA: Diagnosis present

## 2014-08-13 DIAGNOSIS — D630 Anemia in neoplastic disease: Secondary | ICD-10-CM | POA: Diagnosis present

## 2014-08-13 DIAGNOSIS — Z87891 Personal history of nicotine dependence: Secondary | ICD-10-CM

## 2014-08-13 DIAGNOSIS — G629 Polyneuropathy, unspecified: Secondary | ICD-10-CM | POA: Diagnosis present

## 2014-08-13 DIAGNOSIS — E1149 Type 2 diabetes mellitus with other diabetic neurological complication: Secondary | ICD-10-CM

## 2014-08-13 DIAGNOSIS — E46 Unspecified protein-calorie malnutrition: Secondary | ICD-10-CM | POA: Diagnosis present

## 2014-08-13 DIAGNOSIS — K210 Gastro-esophageal reflux disease with esophagitis: Secondary | ICD-10-CM | POA: Diagnosis present

## 2014-08-13 DIAGNOSIS — Z6828 Body mass index (BMI) 28.0-28.9, adult: Secondary | ICD-10-CM | POA: Diagnosis not present

## 2014-08-13 DIAGNOSIS — K259 Gastric ulcer, unspecified as acute or chronic, without hemorrhage or perforation: Principal | ICD-10-CM | POA: Diagnosis present

## 2014-08-13 DIAGNOSIS — C250 Malignant neoplasm of head of pancreas: Secondary | ICD-10-CM

## 2014-08-13 DIAGNOSIS — Y842 Radiological procedure and radiotherapy as the cause of abnormal reaction of the patient, or of later complication, without mention of misadventure at the time of the procedure: Secondary | ICD-10-CM | POA: Diagnosis present

## 2014-08-13 DIAGNOSIS — K2970 Gastritis, unspecified, without bleeding: Secondary | ICD-10-CM | POA: Diagnosis present

## 2014-08-13 DIAGNOSIS — C9211 Chronic myeloid leukemia, BCR/ABL-positive, in remission: Secondary | ICD-10-CM | POA: Diagnosis present

## 2014-08-13 DIAGNOSIS — R141 Gas pain: Secondary | ICD-10-CM

## 2014-08-13 DIAGNOSIS — Z794 Long term (current) use of insulin: Secondary | ICD-10-CM

## 2014-08-13 DIAGNOSIS — R142 Eructation: Secondary | ICD-10-CM

## 2014-08-13 DIAGNOSIS — C259 Malignant neoplasm of pancreas, unspecified: Secondary | ICD-10-CM | POA: Diagnosis present

## 2014-08-13 DIAGNOSIS — Z801 Family history of malignant neoplasm of trachea, bronchus and lung: Secondary | ICD-10-CM | POA: Diagnosis not present

## 2014-08-13 DIAGNOSIS — Z7982 Long term (current) use of aspirin: Secondary | ICD-10-CM

## 2014-08-13 DIAGNOSIS — Z95828 Presence of other vascular implants and grafts: Secondary | ICD-10-CM

## 2014-08-13 DIAGNOSIS — E860 Dehydration: Secondary | ICD-10-CM

## 2014-08-13 DIAGNOSIS — E11649 Type 2 diabetes mellitus with hypoglycemia without coma: Secondary | ICD-10-CM | POA: Diagnosis present

## 2014-08-13 DIAGNOSIS — Z9221 Personal history of antineoplastic chemotherapy: Secondary | ICD-10-CM | POA: Diagnosis not present

## 2014-08-13 DIAGNOSIS — E876 Hypokalemia: Secondary | ICD-10-CM | POA: Diagnosis present

## 2014-08-13 DIAGNOSIS — G4733 Obstructive sleep apnea (adult) (pediatric): Secondary | ICD-10-CM | POA: Diagnosis present

## 2014-08-13 DIAGNOSIS — I10 Essential (primary) hypertension: Secondary | ICD-10-CM | POA: Diagnosis present

## 2014-08-13 DIAGNOSIS — R197 Diarrhea, unspecified: Secondary | ICD-10-CM | POA: Diagnosis present

## 2014-08-13 DIAGNOSIS — Z923 Personal history of irradiation: Secondary | ICD-10-CM

## 2014-08-13 DIAGNOSIS — G4700 Insomnia, unspecified: Secondary | ICD-10-CM

## 2014-08-13 DIAGNOSIS — R1115 Cyclical vomiting syndrome unrelated to migraine: Secondary | ICD-10-CM

## 2014-08-13 DIAGNOSIS — E780 Pure hypercholesterolemia: Secondary | ICD-10-CM | POA: Diagnosis present

## 2014-08-13 DIAGNOSIS — R143 Flatulence: Secondary | ICD-10-CM

## 2014-08-13 DIAGNOSIS — R112 Nausea with vomiting, unspecified: Secondary | ICD-10-CM | POA: Diagnosis present

## 2014-08-13 DIAGNOSIS — Z85828 Personal history of other malignant neoplasm of skin: Secondary | ICD-10-CM

## 2014-08-13 DIAGNOSIS — R110 Nausea: Secondary | ICD-10-CM

## 2014-08-13 DIAGNOSIS — E1142 Type 2 diabetes mellitus with diabetic polyneuropathy: Secondary | ICD-10-CM

## 2014-08-13 DIAGNOSIS — Z9049 Acquired absence of other specified parts of digestive tract: Secondary | ICD-10-CM | POA: Diagnosis present

## 2014-08-13 DIAGNOSIS — K5900 Constipation, unspecified: Secondary | ICD-10-CM | POA: Diagnosis present

## 2014-08-13 DIAGNOSIS — D72819 Decreased white blood cell count, unspecified: Secondary | ICD-10-CM

## 2014-08-13 LAB — CBC WITH DIFFERENTIAL/PLATELET
BASO%: 0.5 % (ref 0.0–2.0)
Basophils Absolute: 0.1 10*3/uL (ref 0.0–0.1)
EOS%: 0.2 % (ref 0.0–7.0)
Eosinophils Absolute: 0 10*3/uL (ref 0.0–0.5)
HEMATOCRIT: 31.6 % — AB (ref 38.4–49.9)
HGB: 10.5 g/dL — ABNORMAL LOW (ref 13.0–17.1)
LYMPH%: 5.1 % — AB (ref 14.0–49.0)
MCH: 33.6 pg — AB (ref 27.2–33.4)
MCHC: 33.3 g/dL (ref 32.0–36.0)
MCV: 100.9 fL — ABNORMAL HIGH (ref 79.3–98.0)
MONO#: 1.3 10*3/uL — ABNORMAL HIGH (ref 0.1–0.9)
MONO%: 9.9 % (ref 0.0–14.0)
NEUT%: 84.3 % — AB (ref 39.0–75.0)
NEUTROS ABS: 11.3 10*3/uL — AB (ref 1.5–6.5)
PLATELETS: 376 10*3/uL (ref 140–400)
RBC: 3.14 10*6/uL — ABNORMAL LOW (ref 4.20–5.82)
RDW: 21.4 % — ABNORMAL HIGH (ref 11.0–14.6)
WBC: 13.3 10*3/uL — ABNORMAL HIGH (ref 4.0–10.3)
lymph#: 0.7 10*3/uL — ABNORMAL LOW (ref 0.9–3.3)

## 2014-08-13 LAB — COMPREHENSIVE METABOLIC PANEL (CC13)
ALK PHOS: 262 U/L — AB (ref 40–150)
ALT: 24 U/L (ref 0–55)
AST: 35 U/L — AB (ref 5–34)
Albumin: 2.8 g/dL — ABNORMAL LOW (ref 3.5–5.0)
Anion Gap: 14 mEq/L — ABNORMAL HIGH (ref 3–11)
BUN: 9.1 mg/dL (ref 7.0–26.0)
CALCIUM: 9.7 mg/dL (ref 8.4–10.4)
CO2: 25 mEq/L (ref 22–29)
CREATININE: 0.9 mg/dL (ref 0.7–1.3)
Chloride: 102 mEq/L (ref 98–109)
Glucose: 110 mg/dl (ref 70–140)
POTASSIUM: 3.3 meq/L — AB (ref 3.5–5.1)
Sodium: 140 mEq/L (ref 136–145)
Total Bilirubin: 1.06 mg/dL (ref 0.20–1.20)
Total Protein: 6.5 g/dL (ref 6.4–8.3)

## 2014-08-13 LAB — GLUCOSE, CAPILLARY: GLUCOSE-CAPILLARY: 97 mg/dL (ref 70–99)

## 2014-08-13 MED ORDER — ACETAMINOPHEN 325 MG PO TABS: 650.0000 mg | ORAL_TABLET | ORAL | Status: DC | PRN

## 2014-08-13 MED ORDER — ONDANSETRON HCL 8 MG PO TABS: 8.0000 mg | ORAL_TABLET | Freq: Three times a day (TID) | ORAL | Status: DC | PRN

## 2014-08-13 MED ORDER — HYDROMORPHONE HCL 4 MG PO TABS: 4.0000 mg | ORAL_TABLET | ORAL | Status: DC | PRN

## 2014-08-13 MED ORDER — SODIUM CHLORIDE 0.9 % IJ SOLN
10.0000 mL | INTRAMUSCULAR | Status: DC | PRN
  Administered 2014-08-13: 10 mL via INTRAVENOUS
  Filled 2014-08-13: qty 10

## 2014-08-13 MED ORDER — ONDANSETRON 8 MG PO TBDP
8.0000 mg | ORAL_TABLET | Freq: Three times a day (TID) | ORAL | Status: DC | PRN
  Administered 2014-08-13: 8 mg via ORAL
  Filled 2014-08-13: qty 1

## 2014-08-13 MED ORDER — MORPHINE SULFATE ER 30 MG PO TBCR
30.0000 mg | EXTENDED_RELEASE_TABLET | Freq: Two times a day (BID) | ORAL | Status: DC
  Administered 2014-08-13 – 2014-08-15 (×5): 30 mg via ORAL
  Filled 2014-08-13 (×5): qty 1

## 2014-08-13 MED ORDER — HYDROMORPHONE HCL 1 MG/ML IJ SOLN
1.0000 mg | INTRAMUSCULAR | Status: DC | PRN
  Administered 2014-08-13 – 2014-08-16 (×8): 1 mg via INTRAVENOUS
  Filled 2014-08-13 (×8): qty 1

## 2014-08-13 MED ORDER — ONDANSETRON 8 MG/NS 50 ML IVPB
8.0000 mg | Freq: Three times a day (TID) | INTRAVENOUS | Status: DC | PRN
Start: 2014-08-13 — End: 2014-08-14
  Filled 2014-08-13: qty 8

## 2014-08-13 MED ORDER — ENOXAPARIN SODIUM 40 MG/0.4ML ~~LOC~~ SOLN
40.0000 mg | SUBCUTANEOUS | Status: DC
  Administered 2014-08-13 – 2014-08-22 (×10): 40 mg via SUBCUTANEOUS
  Filled 2014-08-13 (×11): qty 0.4

## 2014-08-13 MED ORDER — INSULIN ASPART 100 UNIT/ML ~~LOC~~ SOLN
0.0000 [IU] | Freq: Three times a day (TID) | SUBCUTANEOUS | Status: DC
  Administered 2014-08-14: 2 [IU] via SUBCUTANEOUS

## 2014-08-13 MED ORDER — ONDANSETRON 8 MG/NS 50 ML IVPB
8.0000 mg | Freq: Three times a day (TID) | INTRAVENOUS | Status: DC | PRN
  Filled 2014-08-13: qty 8

## 2014-08-13 MED ORDER — POTASSIUM CHLORIDE IN NACL 20-0.9 MEQ/L-% IV SOLN
INTRAVENOUS | Status: DC
  Administered 2014-08-13: 1000 mL via INTRAVENOUS
  Administered 2014-08-14 – 2014-08-16 (×7): via INTRAVENOUS
  Filled 2014-08-13 (×10): qty 1000

## 2014-08-13 NOTE — Telephone Encounter (Signed)
Wife left message stating patient has been dry heaving all weekend and had diarrhea X 5 yesterday/last night. Has appt today with Dr Alvy Bimler, should she bring him into see Dr Alvy Bimler today in this condition?

## 2014-08-13 NOTE — H&P (Signed)
[facility] CONSULT NOTE  Patient Care Team: [name], MD as PCP - General (Internal Medicine) [name], MD as Referring Physician (Specialist) Provider Not In System [name], MD as Consulting Physician (Hematology and Oncology)  CHIEF COMPLAINTS/PURPOSE OF ADMISSION:  Uncontrolled nausea, vomiting, diarrhea and weakness, in the setting of recent neoadjuvant chemoradiation therapy for pancreatic cancer  HISTORY OF PRESENTING ILLNESS:  Evan [name] 73 y.o. male is here because of the recognition from my clinic today for the symptoms above. The patient was recently discharged end of last week for similar complaints. He had diagnosis of locally advanced pancreatic cancer and had just completed concurrent chemoradiation therapy. Last week, he had profound weakness, nausea and dehydration and was admitted to the hospital briefly for intravenous fluids and intravenous antiemetics. Since dismissal from the hospital, he had uncontrolled nausea, vomiting, right upper quadrant abdominal pain, poor oral intake and profound weakness. His wife brought him to my office today and is being admitted for inpatient management.  MEDICAL HISTORY:  Past Medical History  Diagnosis Date  . Chronic back pain greater than 3 months duration   . Bulging discs   . Diabetes mellitus     type II; neuropathy;   . Hypertension   . High cholesterol   . Leukocytosis   . GERD (gastroesophageal reflux disease)   . Pancreatitis 2004  . Anxiety   . DDD (degenerative disc disease) 2005    lumbar spine  . History of smoking 08/01/2012  . Weight loss 08/01/2012  . Cough 08/01/2012  . 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 Left leg pain 08/05/2012  . 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 Chronic pancreatitis   . Pancreatic cancer 06/08/2014  . OSA (obstructive sleep apnea) 2010    does not use CPAP  . Nausea alone 06/13/2014  . Allergy   . Mucositis (ulcerative) due to antineoplastic  therapy 07/02/2014  . Constipated 07/16/2014    SURGICAL HISTORY: Past Surgical History  Procedure Laterality Date  . Cholecystectomy      laparoscopic  . Hernia repair      umbilical  . Skin cancer removed  2012    right ear, basal cell carcinoma.   Marland Kitchen Spinal stretching    . Colonoscopy  2012    [facility]  . Eus Left 06/07/2014    Procedure: UPPER ENDOSCOPIC ULTRASOUND (EUS) LINEAR;  Surgeon: Arta Silence, MD;  Location: [facility];  Service: Endoscopy;  Laterality: Left;  . Ercp N/A 06/07/2014    Procedure: ENDOSCOPIC RETROGRADE CHOLANGIOPANCREATOGRAPHY (ERCP);  Surgeon: Arta Silence, MD;  Location: [facility];  Service: Endoscopy;  Laterality: N/A;  . Eye surgery      bilateral cataracts with lens implant  . Cataract extraction Bilateral   . Port acath placement Left 06/14/14  . Portacath placement Left 06/14/2014    Procedure: INSERTION PORT-A-CATH;  Surgeon: Stark Klein, MD;  Location: [facility];  Service: General;  Laterality: Left;    SOCIAL HISTORY: History   Social History  . Marital Status: Married    Spouse Name: N/A    Number of Children: 2  . Years of Education: N/A   Occupational History  .      retired Programmer, [employer]   Social History Main Topics  . Smoking status: Former Smoker -- 1.50 packs/day for 40 years    Quit date: 01/24/2004  . Smokeless tobacco: Never Used  . Alcohol Use: No  . Drug Use: No  . Sexual Activity: No  Other Topics Concern  . Not on file   Social History Narrative  . No narrative on file    FAMILY HISTORY: Family History  Problem Relation Age of Onset  . Heart attack Mother   . Cancer Mother 33    lung  . Heart attack Father   . Stroke Father     ALLERGIES:  is allergic to ace inhibitors and ciprofloxacin.  MEDICATIONS:  Current Facility-Administered Medications  Medication Dose Route Frequency Provider Last Rate Last Dose  . 0.9 % NaCl with KCl 20 mEq/ L  infusion   Intravenous Continuous [name], MD 125 mL/hr at  08/13/14 1644 1,000 mL at 08/13/14 1644  . acetaminophen (TYLENOL) tablet 650 mg  650 mg Oral Q4H PRN Rafik Koppel, MD      . enoxaparin (LOVENOX) injection 40 mg  40 mg Subcutaneous Q24H Annaka Cleaver, MD      . HYDROmorphone (DILAUDID) injection 1 mg  1 mg Intravenous Q2H PRN [name], MD   1 mg at 08/13/14 1704  . ondansetron (ZOFRAN) tablet 8 mg  8 mg Oral Q8H PRN [name], MD       Or  . ondansetron (ZOFRAN-ODT) disintegrating tablet 8 mg  8 mg Oral Q8H PRN [name], MD   8 mg at 08/13/14 1703   Or  . ondansetron (ZOFRAN) 8 mg/NS 50 ml IVPB  8 mg Intravenous Q8H PRN [name], MD       Or  . ondansetron (ZOFRAN) 8 mg/NS 50 ml IVPB  8 mg Intravenous Q8H PRN [name], MD       Facility-Administered Medications Ordered in Other Encounters  Medication Dose Route Frequency Provider Last Rate Last Dose  . heparin lock flush 100 unit/mL  500 Units Intravenous Once [name], MD      . sodium chloride 0.9 % injection 10 mL  10 mL Intravenous PRN [name], MD        REVIEW OF SYSTEMS:   Constitutional: Denies fevers, chills or abnormal night sweats Eyes: Denies blurriness of vision, double vision or watery eyes Ears, nose, mouth, throat, and face: Denies mucositis or sore throat Respiratory: Denies cough, dyspnea or wheezes Cardiovascular: Denies palpitation, chest discomfort or lower extremity swelling Skin: Denies abnormal skin rashes Lymphatics: Denies new lymphadenopathy or easy bruising Neurological:Denies numbness, tingling or new weaknesses Behavioral/Psych: Mood is stable, no new changes  All other systems were reviewed with the patient and are negative.  PHYSICAL EXAMINATION: ECOG PERFORMANCE STATUS: 3 - Symptomatic, >50% confined to bed  Filed Vitals:   08/13/14 1531  BP: 149/68  Pulse: 85  Temp: 98.8 F (37.1 C)  Resp: 16   Filed Weights   08/13/14 1531  Weight: 223 lb 15.8 oz (101.6 kg)    GENERAL:alert, in moderate distress and appears very uncomfortable.  He is obese and ill appearing SKIN: skin color is pale, texture, turgor are normal, no rashes or significant lesions EYES: normal, conjunctiva are pale and non-injected, sclera clear OROPHARYNX:no exudate, no erythema and lips, buccal mucosa, and tongue normal . He has dry mucous membranes NECK: supple, thyroid normal size, non-tender, without nodularity LYMPH:  no palpable lymphadenopathy in the cervical, axillary or inguinal LUNGS: clear to auscultation and percussion with normal breathing effort HEART: regular rate & rhythm and no murmurs and no lower extremity edema ABDOMEN:abdomen soft, non-tender and very active bowel sounds. His abdomen appeared distended Musculoskeletal:no cyanosis of digits and no clubbing  PSYCH: alert & oriented x 3 with  fluent speech NEURO: no focal motor/sensory deficits  LABORATORY DATA:  I have reviewed the data as listed Lab Results  Component Value Date   WBC 13.3* 08/13/2014   HGB 10.5* 08/13/2014   HCT 31.6* 08/13/2014   MCV 100.9* 08/13/2014   PLT 376 08/13/2014    Recent Labs  05/29/14 1425  06/08/14 0437  06/25/14 1100  08/06/14 1515 08/07/14 1124 08/08/14 0425 08/13/14 1344  NA  --   < > 138  < > 137  < > 138 137 140 140  K  --   < > 3.7  < > 4.8  < > 3.7 3.7 3.7 3.3*  CL  --   < > 100  --  99  --   --  101 105  --   CO2  --   < > 23  < > 25  < > 25 24 24 25   GLUCOSE  --   < > 182*  < > 291*  < > 159* 128* 66* 110  BUN  --   < > 15  < > 17  < > 12.9 14 10  9.1  CREATININE  --   < > 1.07  < > 1.27  < > 1.0 1.06 0.88 0.9  CALCIUM  --   < > 9.9  < > 10.8*  < > 9.2 8.7 8.3* 9.7  GFRNONAA  --   < > 68*  --   --   --   --  69* 84*  --   GFRAA  --   < > 79*  --   --   --   --  80* >90  --   PROT 7.2  < > 6.9  < > 7.2  < > 6.4 6.1  --  6.5  ALBUMIN 3.8  < > 3.3*  < > 3.7  < > 3.0* 3.0*  --  2.8*  AST 201*  < > 124*  < > 22  < > 25 29  --  35*  ALT 342*  < > 251*  < > 37  < > 21 21  --  24  ALKPHOS 284*  < > 367*  < > 173*  < > 179* 185*  --   262*  BILITOT 1.1  < > 2.5*  < > 0.8  < > 0.66 0.7  --  1.06  BILIDIR 0.5*  --   --   --   --   --   --   --   --   --   IBILI 0.6  --   --   --   --   --   --   --   --   --   < > = values in this interval not displayed.  RADIOGRAPHIC STUDIES: I have personally reviewed the radiological images as listed and agreed with the findings in the report. Acute Abdominal Series  08/13/2014   CLINICAL DATA:  Abdominal pain with nausea and diarrhea  EXAM: ACUTE ABDOMEN SERIES (ABDOMEN 2 VIEW & CHEST 1 VIEW)  COMPARISON:  Chest radiograph June 14, 2014 ; CT abdomen and pelvis September 09, 2010  FINDINGS: PA chest: No edema or consolidation. Heart is upper normal in size with pulmonary vascularity within normal limits. Port-A-Cath tip is in the superior vena cava. No adenopathy.  Supine and left lateral decubitus images were obtained. There is a biliary regions stent. There are surgical clips in right upper quadrant. There is moderate stool  in the colon. There is no appreciable bowel dilatation or air-fluid level to suggest obstruction. There is no demonstrable free air. There are vascular calcifications in the pelvis.  IMPRESSION: Bowel gas pattern unremarkable. No obstruction or free air seen. No lung edema or consolidation.   Electronically Signed   By: Lowella Grip M.D.   On: 08/13/2014 17:40    ASSESSMENT & PLAN:  #1 uncontrolled nausea, vomiting and dehydration I will continue IV fluids replacement therapy along with intravenous anti-emetics. #2 leukocytosis I am not sure what that this could be stress response. I will order cultures to exclude infection #3 abdominal distention, pain and diarrhea. I will order stool studies to exclude infection. I will order an abdominal x-ray to rule out bowel obstruction. I will order pain medicine as needed intravenously. Once C. difficile is ruled out I will start him on Imodium. He will be put on liquid diet for now and IV fluid resuscitation #4  anemia Persistent anemia of neoplastic disease. He is not symptomatic. I would transfuse if hemoglobin drops to less than 8 g. #5 pancreatic cancer I will order a CA 19-9 tomorrow. He may need an abdominal CT scan prior to dismissal to exclude carcinomatosis or disease progression #6 DVT prophylaxis He will be placed on Lovenox #7 hypokalemia This is due to diarrhea. We'll check a magnesium level tomorrow. I will replace with IV fluids and IV potassium. #8 protein calorie malnutrition I will start him on a liquid diet. 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 #10 discharge planning The patient is readmitted to due to recent premature dismissal last week. I estimated minimum 3-4 days of admission with round-the-clock IV fluids. Goals of care is discussed with the patient. I will discharge him once he is able to tolerate oral intake without nausea or vomiting. #10 status Full code  Orders Placed This Encounter  Procedures  . Urine culture    Standing Status: Standing     Number of Occurrences: 1     Standing Expiration Date:     Order Specific Question:  Patient immune status    Answer:  Immunocompromised  . Acute Abdominal Series    Standing Status: Standing     Number of Occurrences: 1     Standing Expiration Date:     Order Specific Question:  Reason for exam:    Answer:  diarrhea, abdominal pain, nausea and vomiting, exclude SBO  . Clostridium difficile culture-fecal    Standing Status: Standing     Number of Occurrences: 1     Standing Expiration Date:   . Comprehensive metabolic panel    Standing Status: Standing     Number of Occurrences: 1     Standing Expiration Date:   . Diet full liquid    Standing Status: Standing     Number of Occurrences: 1     Standing  Expiration Date:     Order Specific Question:  Room service appropriate?    Answer:  Yes    Order Specific Question:  Fluid consistency:    Answer:  Thin  . Notify physician    Notify physician for pulse less than 55 or greater than 120, respiratory rate less than 12 or greater than 25, temperature greater than 100.5 F, urinary output less than 30 mL/kg/hr for  four hours, systolic BP less than 90 or greater than 428, diastolic BP less than 60 or greater than 100.    Standing Status: Standing     Number of Occurrences: 20     Standing Expiration Date:   . Oncology -  Do Not continue treatment plan    Standing Status: Standing     Number of Occurrences: 1     Standing Expiration Date:   . Up ad lib    Standing Status: Standing     Number of Occurrences: 1     Standing Expiration Date:   Marland Kitchen Vital signs    Standing Status: Standing     Number of Occurrences: 1     Standing Expiration Date:   . Place order for blood cultures x 2 (from different sites) for Temp > 101F    Standing Status: Standing     Number of Occurrences: 1     Standing Expiration Date:   . Full code    Standing Status: Standing     Number of Occurrences: 1     Standing Expiration Date:   . Medication reconciliation pending pharmacist review    Standing Status: Standing     Number of Occurrences: 1     Standing Expiration Date:     Order Specific Question:  Comment:    Answer:  sprycel  . Admit to Inpatient (patient's expected length of stay will be greater than 2 midnights)    Standing Status: Standing     Number of Occurrences: 1     Standing Expiration Date:     Order Specific Question:  Hospital Area    Answer:  [facility] [[id number]]    Order Specific Question:  Diagnosis    Answer:  Pancreatic cancer [[id number]]    Order Specific Question:  Level of Care    Answer:  Med-Surg [16]    Order Specific Question:  Admitting Physician    AnswerAlvy Bimler, TD [[id number]]    Order Specific Question:   Estimated length of stay    Answer:  3 - 4 days    Order Specific Question:  Certification:    Answer:  I certify this patient will need inpatient services for at least 2 midnights    Order Specific Question:  Attending Physician    AnswerAlvy Bimler, GT [[id number]]    Order Specific Question:  PT Class (Do Not Modify)    Answer:  Inpatient [101]    Order Specific Question:  PT Acc Code (Do Not Modify)    Answer:  Private [1]    All questions were answered. The patient knows to call the clinic with any problems, questions or concerns.    [facility], [place], MD 08/13/2014 8:07 PM

## 2014-08-13 NOTE — H&P (Signed)
This is an addendum to admission note today. I will continue to hold Sprycel that he was taking for CML. The risk of treatment outweighs the benefit from now. His last BCR/ABL was undetectable

## 2014-08-13 NOTE — Telephone Encounter (Signed)
Pt to come in to see Dr Alvy Bimler

## 2014-08-13 NOTE — Patient Instructions (Signed)

## 2014-08-13 NOTE — Progress Notes (Signed)
Wife and son at bedside. Pt having dry heaves. Medicated with zofran and dilaudid. Abdominal x-rays done. Family states pt is to weak to walk to bathroom. Bed alarm on . Urinal at bedside.

## 2014-08-13 NOTE — Progress Notes (Signed)
See admission H&P

## 2014-08-14 LAB — GLUCOSE, CAPILLARY
GLUCOSE-CAPILLARY: 133 mg/dL — AB (ref 70–99)
Glucose-Capillary: 118 mg/dL — ABNORMAL HIGH (ref 70–99)
Glucose-Capillary: 130 mg/dL — ABNORMAL HIGH (ref 70–99)
Glucose-Capillary: 147 mg/dL — ABNORMAL HIGH (ref 70–99)

## 2014-08-14 LAB — COMPREHENSIVE METABOLIC PANEL
ALT: 19 U/L (ref 0–53)
ANION GAP: 17 — AB (ref 5–15)
AST: 25 U/L (ref 0–37)
Albumin: 2.6 g/dL — ABNORMAL LOW (ref 3.5–5.2)
Alkaline Phosphatase: 235 U/L — ABNORMAL HIGH (ref 39–117)
BUN: 12 mg/dL (ref 6–23)
CALCIUM: 8.8 mg/dL (ref 8.4–10.5)
CO2: 25 mEq/L (ref 19–32)
Chloride: 99 mEq/L (ref 96–112)
Creatinine, Ser: 0.88 mg/dL (ref 0.50–1.35)
GFR, EST NON AFRICAN AMERICAN: 84 mL/min — AB (ref >90)
GLUCOSE: 119 mg/dL — AB (ref 70–99)
Potassium: 3.8 mEq/L (ref 3.7–5.3)
Sodium: 141 mEq/L (ref 137–147)
Total Bilirubin: 0.8 mg/dL (ref 0.3–1.2)
Total Protein: 5.9 g/dL — ABNORMAL LOW (ref 6.0–8.3)

## 2014-08-14 LAB — CLOSTRIDIUM DIFFICILE BY PCR: CDIFFPCR: NEGATIVE

## 2014-08-14 LAB — CANCER ANTIGEN 19-9: CA 19-9: 259.4 U/mL — ABNORMAL HIGH (ref <35.0)

## 2014-08-14 MED ORDER — ONDANSETRON 8 MG/NS 50 ML IVPB
8.0000 mg | Freq: Three times a day (TID) | INTRAVENOUS | Status: DC
  Administered 2014-08-14 – 2014-08-16 (×6): 8 mg via INTRAVENOUS
  Filled 2014-08-14 (×8): qty 8

## 2014-08-14 MED ORDER — BOOST / RESOURCE BREEZE PO LIQD
1.0000 | Freq: Two times a day (BID) | ORAL | Status: DC
  Administered 2014-08-14 – 2014-08-17 (×4): 1 via ORAL

## 2014-08-14 NOTE — Progress Notes (Signed)
INITIAL NUTRITION ASSESSMENT  DOCUMENTATION CODES Per approved criteria  -Severe malnutrition in the context of chronic illness  Pt meets criteria for severe MALNUTRITION in the context of chronic disease as evidenced by 11% body weight loss in 3 months, PO intake < 75% for > one month.   INTERVENTION: -Recommend Resource Breeze po BID, each supplement provides 250 kcal and 9 grams of protein. Monitor CBG's; modify to Boost or Ensure as tolerated -Encourage intake of bland, easy to tolerate foods -RD to continue to monitor   NUTRITION DIAGNOSIS: Inadequate oral intake related to nausea/vomiting/abd pain as evidenced by PO intake < 75%, ongoing weight loss.   Goal: Pt to meet >/= 90% of their estimated nutrition needs    Monitor:  Total protein/energy intake, labs, weight, GI profile supplement tolerance  Reason for Assessment: MST  73 y.o. male  Admitting Dx: <principal problem not specified>  ASSESSMENT: Last week, he had profound weakness, nausea and dehydration and was admitted to the hospital briefly for intravenous fluids and intravenous antiemetics. Since dismissal from the hospital, he had uncontrolled nausea, vomiting, right upper quadrant abdominal pain, poor oral intake and profound weakness.  -Pt known to RD from previous assessment on 08/08/2014. Endorsed poor PO for past 5 weeks d/t nausea, vomiting and early satiety. Had been trying different nutrition supplements, Glucerna or Boost Glucerna Control; however pt had been refusing them. Trialed MagicCup protein supplement for alternative;however pt reported disliking taste. PO intake was approximately 50% -Pt reported tolerating minimal intake since d/c. Can consume some fluids and broth-based soups. Has not been drinking nutrition supplements as they exacerbate nausea and vomiting; however did note he tolerated them when used as a milkshake.  -Offered to prepare daily milkshakes for pt; declined as he was still  experiencing nausea. Has only been able to tolerate beef broth. -Was willing to trial [employer] supplement. Monitor CBGS and modify to milkshake/Ensure supplement as tolerated -Pt continues to lose weight. Has lost 7 lbs in past two weeks (3% body weight loss, severe for time frame), and 25 lbs in past 3 months (11% body weight loss, severe for time frame) -Pt w/loose stools, C.diff results pending  Height: Ht Readings from Last 1 Encounters:  08/13/14 6\' 2"  (1.88 m)    Weight: Wt Readings from Last 1 Encounters:  08/13/14 223 lb 15.8 oz (101.6 kg)    Ideal Body Weight: 190 lb  % Ideal Body Weight: 117%  Wt Readings from Last 10 Encounters:  08/13/14 223 lb 15.8 oz (101.6 kg)  08/13/14 223 lb 3.2 oz (101.243 kg)  08/07/14 226 lb 10.1 oz (102.8 kg)  08/06/14 226 lb (102.513 kg)  08/01/14 231 lb 9.6 oz (105.053 kg)  07/26/14 235 lb 4.8 oz (106.731 kg)  07/24/14 230 lb 8 oz (104.554 kg)  07/20/14 233 lb 4.8 oz (105.824 kg)  07/16/14 235 lb 8 oz (106.822 kg)  07/13/14 232 lb 14.4 oz (105.643 kg)    Usual Body Weight: 250 lb  % Usual Body Weight: 89%  BMI:  Body mass index is 28.75 kg/(m^2).  Estimated Nutritional Needs: Kcal: 9678-9381 Protein: 105-120 Fluid: 2300 ml daily  Skin: non pitting RLE and LLE edema  Diet Order: Full Liquid  EDUCATION NEEDS: -Education needs addressed   Intake/Output Summary (Last 24 hours) at 08/14/14 1056 Last data filed at 08/14/14 0727  Gross per 24 hour  Intake 1980.93 ml  Output   1125 ml  Net 855.93 ml    Last BM: 9/28  Labs:   Recent Labs Lab 08/07/14 1124 08/08/14 0425 08/13/14 1344 08/14/14 0515  NA 137 140 140 141  K 3.7 3.7 3.3* 3.8  CL 101 105  --  99  CO2 24 24 25 25   BUN 14 10 9.1 12  CREATININE 1.06 0.88 0.9 0.88  CALCIUM 8.7 8.3* 9.7 8.8  GLUCOSE 128* 66* 110 119*    CBG (last 3)   Recent Labs  08/13/14 2156 08/14/14 0801  GLUCAP 97 118*    Scheduled Meds: . enoxaparin (LOVENOX)  injection  40 mg Subcutaneous Q24H  . feeding supplement (RESOURCE BREEZE)  1 Container Oral BID BM  . insulin aspart  0-24 Units Subcutaneous TID WC  . morphine  30 mg Oral Q12H  . ondansetron (ZOFRAN) IV  8 mg Intravenous 3 times per day    Continuous Infusions: . 0.9 % NaCl with KCl 20 mEq / L 125 mL/hr at 08/14/14 1601    Past Medical History  Diagnosis Date  . Chronic back pain greater than 3 months duration   . Bulging discs   . Diabetes mellitus     type II; neuropathy;   . Hypertension   . High cholesterol   . Leukocytosis   . GERD (gastroesophageal reflux disease)   . Pancreatitis 2004  . Anxiety   . DDD (degenerative disc disease) 2005    lumbar spine  . History of smoking 08/01/2012  . Weight loss 08/01/2012  . Cough 08/01/2012  . 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 Left leg pain 08/05/2012  . 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 Chronic pancreatitis   . Pancreatic cancer 06/08/2014  . OSA (obstructive sleep apnea) 2010    does not use CPAP  . Nausea alone 06/13/2014  . Allergy   . Mucositis (ulcerative) due to antineoplastic therapy 07/02/2014  . Constipated 07/16/2014    Past Surgical History  Procedure Laterality Date  . Cholecystectomy      laparoscopic  . Hernia repair      umbilical  . Skin cancer removed  2012    right ear, basal cell carcinoma.   Marland Kitchen Spinal stretching    . Colonoscopy  2012    [facility]  . Eus Left 06/07/2014    Procedure: UPPER ENDOSCOPIC ULTRASOUND (EUS) LINEAR;  Surgeon: Arta Silence, MD;  Location: [facility];  Service: Endoscopy;  Laterality: Left;  . Ercp N/A 06/07/2014    Procedure: ENDOSCOPIC RETROGRADE CHOLANGIOPANCREATOGRAPHY (ERCP);  Surgeon: Arta Silence, MD;  Location: [facility];  Service: Endoscopy;  Laterality: N/A;  . Eye surgery      bilateral cataracts with lens implant  . Cataract extraction Bilateral   . Port acath placement Left 06/14/14  . Portacath placement Left 06/14/2014    Procedure: INSERTION  PORT-A-CATH;  Surgeon: Stark Klein, MD;  Location: [facility];  Service: General;  Laterality: Left;    Atlee Abide MS RD LDN Clinical Dietitian [phone]]

## 2014-08-14 NOTE — Progress Notes (Signed)
Evan [name]   DOB:1941-08-13   YT#:[id number]   EXN#:[id number]  I have seen the patient, examined him and edited the notes as follows  Subjective: Patient continues to have uncontrolled nausea. Last emesis early today. Diarrhea controlled since last evening. He is very weak and ill appearing. right abdominal pain better controlled with current meds. Appetite decreased due to symptoms. Denies cardiac or respiratory complaints.  Follows commands, no confusion noted.    Scheduled Meds: . enoxaparin (LOVENOX) injection  40 mg Subcutaneous Q24H  . insulin aspart  0-24 Units Subcutaneous TID WC  . morphine  30 mg Oral Q12H  . ondansetron (ZOFRAN) IV  8 mg Intravenous 3 times per day   Continuous Infusions: . 0.9 % NaCl with KCl 20 mEq / L 125 mL/hr at 08/14/14 0823   PRN Meds:acetaminophen, HYDROmorphone (DILAUDID) injection, HYDROmorphone   Objective:  Filed Vitals:   08/14/14 0549  BP: 138/70  Pulse: 82  Temp: 98.3 F (36.8 C)  Resp: 16      Intake/Output Summary (Last 24 hours) at 08/14/14 0956 Last data filed at 08/14/14 0727  Gross per 24 hour  Intake 1980.93 ml  Output   1125 ml  Net 855.93 ml    ECOG PERFORMANCE STATUS: 3  GENERAL:alert, no distress and uncomfortable due to nausea, vomiting and pain. ill appearing. SKIN: skin color, texture, turgor are normal, no rashes or significant lesions EYES: normal, conjunctiva are pink and non-injected, sclera clear OROPHARYNX:no exudate, no erythema and lips, buccal mucosa, and tongue normal . mucosa less dry from prior day. NECK: supple, thyroid normal size, non-tender, without nodularity LYMPH:  no palpable lymphadenopathy in the cervical, axillary or inguinal LUNGS: clear to auscultation and percussion with normal breathing effort HEART: regular rate & rhythm and no murmurs and no lower extremity edema ABDOMEN:abdomen soft, non-tender and very active bowel sounds. His abdomen appeared distended Musculoskeletal:no  cyanosis of digits and no clubbing  PSYCH: alert & oriented x 3 with fluent speech NEURO: no focal motor/sensory deficits    CBG (last 3)   Recent Labs  08/13/14 2156 08/14/14 0801  GLUCAP 97 118*     Labs:   Recent Labs Lab 08/07/14 1124 08/08/14 0425 08/13/14 1344  WBC 3.5* 6.1 13.3*  HGB 9.0* 8.9* 10.5*  HCT 25.7* 25.9* 31.6*  PLT 162 146* 376  MCV 98.1 99.6 100.9*  MCH 34.4* 34.2* 33.6*  MCHC 35.0 34.4 33.3  RDW 20.0* 20.3* 21.4*  LYMPHSABS 0.3*  --  0.7*  MONOABS 0.5  --  1.3*  EOSABS 0.0  --  0.0  BASOSABS 0.0  --  0.1     Chemistries:    Recent Labs Lab 08/07/14 1124 08/08/14 0425 08/13/14 1344 08/14/14 0515  NA 137 140 140 141  K 3.7 3.7 3.3* 3.8  CL 101 105  --  99  CO2 24 24 25 25   GLUCOSE 128* 66* 110 119*  BUN 14 10 9.1 12  CREATININE 1.06 0.88 0.9 0.88  CALCIUM 8.7 8.3* 9.7 8.8  AST 29  --  35* 25  ALT 21  --  24 19  ALKPHOS 185*  --  262* 235*  BILITOT 0.7  --  1.06 0.8    GFR Estimated Creatinine Clearance: 98 ml/min (by C-G formula based on Cr of 0.88).  Liver Function Tests:  Recent Labs Lab 08/07/14 1124 08/13/14 1344 08/14/14 0515  AST 29 35* 25  ALT 21 24 19   ALKPHOS 185* 262* 235*  BILITOT 0.7 1.06  0.8  PROT 6.1 6.5 5.9*  ALBUMIN 3.0* 2.8* 2.6*  CBG:  Recent Labs Lab 08/08/14 2154 08/09/14 0721 08/09/14 1142 08/13/14 2156 08/14/14 0801  GLUCAP 76 97 96 97 118*     Imaging Studies:  Acute Abdominal Series  08/13/2014   CLINICAL DATA:  Abdominal pain with nausea and diarrhea  EXAM: ACUTE ABDOMEN SERIES (ABDOMEN 2 VIEW & CHEST 1 VIEW)  COMPARISON:  Chest radiograph June 14, 2014 ; CT abdomen and pelvis September 09, 2010  FINDINGS: PA chest: No edema or consolidation. Heart is upper normal in size with pulmonary vascularity within normal limits. Port-A-Cath tip is in the superior vena cava. No adenopathy.  Supine and left lateral decubitus images were obtained. There is a biliary regions stent. There are  surgical clips in right upper quadrant. There is moderate stool in the colon. There is no appreciable bowel dilatation or air-fluid level to suggest obstruction. There is no demonstrable free air. There are vascular calcifications in the pelvis.  IMPRESSION: Bowel gas pattern unremarkable. No obstruction or free air seen. No lung edema or consolidation.   Electronically Signed   By: Lowella Grip M.D.   On: 08/13/2014 17:40    Assessment/Plan: 73 y.o.  #1 uncontrolled nausea, vomiting and dehydration  Continue IV fluids at 125 cc/hr along with round the clock intravenous anti-emetics.  Recent abdominal x-ray did not show evidence of bowel obstruction. Once his symptoms improve, I recommend ordering CT scan to exclude disease progression as a cause of his symptoms.  #2 leukocytosis  Likely due to stress response. Cultures to exclude infection are pending  #3 abdominal distention, pain and diarrhea.  Stool studies to exclude infection were performed on admission, results pending Abdominal x-ray on 9/28 was negative for bowel obstruction.  Continue pain medicine as needed intravenously, as prescribed.  Once C. difficile is ruled out, will start him on Imodium.  Continue liquid diet for now and IV fluid resuscitation until able to tolerate formed foods.   #4 anemia  Persistent anemia of neoplastic disease. He is not symptomatic.  Would transfuse if hemoglobin drops to less than 8 g.   #5 pancreatic cancer  CA 19-9 was drawn on 9/29, results pending. He may need an abdominal CT scan prior to dismissal to exclude carcinomatosis or disease progression   #6 DVT prophylaxis  On Lovenox   #7 hypokalemia  This is due to diarrhea. Magnesium level is pending Potasium levels have been normalized, today at 3.8 after  IV replenishment   #8 protein calorie malnutrition  He was started on  liquid diet. 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   #10 discharge planning  The patient is readmitted to due to recent premature dismissal last week.  Estimate 3 days with round-the-clock IV fluids. Goals of care is discussed with the patient. I will discharge him once he is able to tolerate oral intake without nausea or vomiting.   #11 status  Full code    **Disclaimer: This note was dictated with voice recognition software. Similar sounding words can inadvertently be transcribed and this note may contain transcription errors which may not have been corrected upon publication of note.Sharene Butters E, PA-C 08/14/2014  9:56 AM Tymia Streb, MD 08/14/2014

## 2014-08-14 NOTE — Consult Note (Signed)
Referring Provider: Dr. Alvy Bimler Primary Care Physician:  [name], MD   Reason for Consultation:  Nausea and vomiting; Pancreatic cancer  HPI: Evan [name] is a 73 y.o. male diagnosed with pancreatic cancer in July, who is s/p a metal biliary stent and FNA confirming pancreatic cancer at that time. He was started on neoadjuvant chemoradiation therapy and had recently completed chemotherapy about 2 weeks ago and radiation last week. His wife reports he was eating and drinking ok in August and then his PO intake decreased significantly over the last 3 weeks. For the past 2 weeks he has been having diffuse sharp abdominal pain that sometimes doubles him over in pain. He was having persistent nausea for several weeks without vomiting until this past Sunday (08/12/14) when he began having intractable vomiting and dry heaving. His wife reports that he has vomited multiple times since Sunday night. Low grade fevers and frequent chills. Weakness has been occurring for a while now. Wife denies that he had dizziness. He was hospitalized last week (08/07/14- 08/09/14) with weakness, hypoglycemia, abdominal pain, and nausea and medically managed. Was constipated during chemotherapy and had been on a laxative until Sunday. He started having profuse diarrhea Sunday. C. Diff negative today. ALP 262 on admit otherwise LFTs normal. Last imaging from MRI in July and repeat CT had been planned for next week. Wife and son at bedside.    Past Medical History  Diagnosis Date  . Chronic back pain greater than 3 months duration   . Bulging discs   . Diabetes mellitus     type II; neuropathy;   . Hypertension   . High cholesterol   . Leukocytosis   . GERD (gastroesophageal reflux disease)   . Pancreatitis 2004  . Anxiety   . DDD (degenerative disc disease) 2005    lumbar spine  . History of smoking 08/01/2012  . Weight loss 08/01/2012  . Cough 08/01/2012  . 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 Left leg pain 08/05/2012  . 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 Chronic pancreatitis   . Pancreatic cancer 06/08/2014  . OSA (obstructive sleep apnea) 2010    does not use CPAP  . Nausea alone 06/13/2014  . Allergy   . Mucositis (ulcerative) due to antineoplastic therapy 07/02/2014  . Constipated 07/16/2014    Past Surgical History  Procedure Laterality Date  . Cholecystectomy      laparoscopic  . Hernia repair      umbilical  . Skin cancer removed  2012    right ear, basal cell carcinoma.   Marland Kitchen Spinal stretching    . Colonoscopy  2012    [facility]  . Eus Left 06/07/2014    Procedure: UPPER ENDOSCOPIC ULTRASOUND (EUS) LINEAR;  Surgeon: Arta Silence, MD;  Location: [facility];  Service: Endoscopy;  Laterality: Left;  . Ercp N/A 06/07/2014    Procedure: ENDOSCOPIC RETROGRADE CHOLANGIOPANCREATOGRAPHY (ERCP);  Surgeon: Arta Silence, MD;  Location: [facility];  Service: Endoscopy;  Laterality: N/A;  . Eye surgery      bilateral cataracts with lens implant  . Cataract extraction Bilateral   . Port acath placement Left 06/14/14  . Portacath placement Left 06/14/2014    Procedure: INSERTION PORT-A-CATH;  Surgeon: Stark Klein, MD;  Location: [facility]S;  Service: General;  Laterality: Left;    Prior to Admission medications   Medication Sig Start Date End Date Taking? Authorizing Provider  acetaminophen (TYLENOL) 325 MG tablet Take 650 mg by mouth every 6 (six) hours  as needed for moderate pain (low grade fever).    Yes Historical Provider, MD  Alum & Mag Hydroxide-Simeth (MAGIC MOUTHWASH W/LIDOCAINE) SOLN Take 5 mLs by mouth 4 (four) times daily.   Yes Historical Provider, MD  aspirin EC 81 MG tablet Take 81 mg by mouth daily.   Yes Historical Provider, MD  B Complex-C (SUPER B COMPLEX/VITAMIN C PO) Take 1 tablet by mouth daily.    Yes Historical Provider, MD  cholecalciferol (VITAMIN D) 1000 UNITS tablet Take 1,000 Units by mouth daily.   Yes Historical Provider, MD  dasatinib (SPRYCEL) 50 MG  tablet Take 50 mg by mouth at bedtime. 01/15/14  Yes [name], MD  diphenhydrAMINE (BENADRYL) 25 mg capsule Take 25 mg by mouth every 6 (six) hours as needed for itching (itching).   Yes Historical Provider, MD  HYDROmorphone (DILAUDID) 4 MG tablet Take 4 mg by mouth every 4 (four) hours as needed for severe pain (pain).   Yes Historical Provider, MD  insulin glargine (LANTUS) 100 UNIT/ML injection Inject 0.15 mLs (15 Units total) into the skin at bedtime. 08/09/14  Yes Modena Jansky, MD  lactulose (CHRONULAC) 10 GM/15ML solution Take 15 mLs by mouth as needed for severe constipation (constipation). Hasn't used as yet, 06/21/14  Yes Historical Provider, MD  lidocaine-prilocaine (EMLA) cream Apply 1 application topically as needed (pain).   Yes Historical Provider, MD  losartan (COZAAR) 100 MG tablet Take 100 mg by mouth every morning.    Yes Historical Provider, MD  metFORMIN (GLUCOPHAGE) 1000 MG tablet Take 1,000 mg by mouth 2 (two) times daily with a meal.   Yes Historical Provider, MD  morphine (MS CONTIN) 30 MG 12 hr tablet Take 1 tablet (30 mg total) by mouth every 12 (twelve) hours. 07/24/14  Yes [name], MD  polyethylene glycol (MIRALAX / GLYCOLAX) packet Take 17 g by mouth 2 (two) times daily. 08/09/14  Yes Modena Jansky, MD  promethazine (PHENERGAN) 25 MG tablet Take 25 mg by mouth every 6 (six) hours as needed for nausea or vomiting (nausea).   Yes Historical Provider, MD  saxagliptin HCl (ONGLYZA) 5 MG TABS tablet Take 5 mg by mouth daily.   Yes Historical Provider, MD  senna (SENOKOT) 8.6 MG tablet Take 2 tablets by mouth daily.    Yes Historical Provider, MD  simvastatin (ZOCOR) 20 MG tablet Take 20 mg by mouth every evening.   Yes Historical Provider, MD  vitamin B-12 (CYANOCOBALAMIN) 1000 MCG tablet Take 1,000 mcg by mouth daily.   Yes Historical Provider, MD  docusate sodium 100 MG CAPS Take 100 mg by mouth 2 (two) times daily. 08/09/14   Modena Jansky, MD    Scheduled Meds: .  enoxaparin (LOVENOX) injection  40 mg Subcutaneous Q24H  . feeding supplement (RESOURCE BREEZE)  1 Container Oral BID BM  . insulin aspart  0-24 Units Subcutaneous TID WC  . morphine  30 mg Oral Q12H  . ondansetron (ZOFRAN) IV  8 mg Intravenous 3 times per day   Continuous Infusions: . 0.9 % NaCl with KCl 20 mEq / L 125 mL/hr at 08/14/14 1635   PRN Meds:.acetaminophen, HYDROmorphone (DILAUDID) injection, HYDROmorphone  Allergies as of 08/13/2014 - Review Complete 08/13/2014  Allergen Reaction Noted  . Ace inhibitors  06/13/2014  . Ciprofloxacin Rash 06/11/2014    Family History  Problem Relation Age of Onset  . Heart attack Mother   . Cancer Mother 76    lung  . Heart attack Father   .  Stroke Father     History   Social History  . Marital Status: Married    Spouse Name: N/A    Number of Children: 2  . Years of Education: N/A   Occupational History  .      retired Programmer, [employer]   Social History Main Topics  . Smoking status: Former Smoker -- 1.50 packs/day for 40 years    Quit date: 01/24/2004  . Smokeless tobacco: Never Used  . Alcohol Use: No  . Drug Use: No  . Sexual Activity: No   Other Topics Concern  . Not on file   Social History Narrative  . No narrative on file    Review of Systems: All negative from a GI standpoint except as stated above in HPI.  Physical Exam: Vital signs: Filed Vitals:   08/14/14 0549  BP: 138/70  Pulse: 82  Temp: 98.3 F (36.8 C)  Resp: 16   Last BM Date: 08/13/14 General:   Lethargic, no acute distress, well-developed, elderly HEENT: anicteric Neck: supple, nontender Lungs:  Clear throughout to auscultation.   No wheezes, crackles, or rhonchi. No acute distress. Heart:  Regular rate and rhythm; no murmurs, clicks, rubs,  or gallops. Abdomen: diffusely tender with guarding, soft, nondistended, +BS  Rectal:  Deferred Ext: trace LE edema Skin: no rash noted  GI:  Lab Results:  Recent Labs  08/13/14 1344  WBC  13.3*  HGB 10.5*  HCT 31.6*  PLT 376   BMET  Recent Labs  08/13/14 1344 08/14/14 0515  NA 140 141  K 3.3* 3.8  CL  --  99  CO2 25 25  GLUCOSE 110 119*  BUN 9.1 12  CREATININE 0.9 0.88  CALCIUM 9.7 8.8   LFT  Recent Labs  08/14/14 0515  PROT 5.9*  ALBUMIN 2.6*  AST 25  ALT 19  ALKPHOS 235*  BILITOT 0.8   PT/INR No results found for this basename: LABPROT, INR,  in the last 72 hours   Studies/Results: Acute Abdominal Series  08/13/2014   CLINICAL DATA:  Abdominal pain with nausea and diarrhea  EXAM: ACUTE ABDOMEN SERIES (ABDOMEN 2 VIEW & CHEST 1 VIEW)  COMPARISON:  Chest radiograph June 14, 2014 ; CT abdomen and pelvis September 09, 2010  FINDINGS: PA chest: No edema or consolidation. Heart is upper normal in size with pulmonary vascularity within normal limits. Port-A-Cath tip is in the superior vena cava. No adenopathy.  Supine and left lateral decubitus images were obtained. There is a biliary regions stent. There are surgical clips in right upper quadrant. There is moderate stool in the colon. There is no appreciable bowel dilatation or air-fluid level to suggest obstruction. There is no demonstrable free air. There are vascular calcifications in the pelvis.  IMPRESSION: Bowel gas pattern unremarkable. No obstruction or free air seen. No lung edema or consolidation.   Electronically Signed   By: Lowella Grip M.D.   On: 08/13/2014 17:40    Impression/Plan: 73 yo with pancreatic cancer and s/p chemoradiation admitted for persistent nausea and vomiting. ALP mildly elevated but other LFTs normal and suspect metal stent still patent. Doubt N/V due to biliary obstruction. Main concern would be for mass effect on the stomach or development of carcinomatosis causing pain, failure to thrive, and persisting nausea and vomiting with an ulcer being less likely. Would do an abd/pelvic CT with IV contrast (no oral contrast) to further evaluate and look for any progression of his  cancer. If no evidence of  progression on imaging, then will need an endoscopy to look for peptic ulcer disease or gastric outlet obstruction. NPO until CT and then clears ok. Supportive care. Thank you for this consultation. Will follow.    LOS: 1 day   [place] C.  08/14/2014, 5:34 PM

## 2014-08-15 ENCOUNTER — Inpatient Hospital Stay (HOSPITAL_COMMUNITY): Payer: Medicare Other

## 2014-08-15 ENCOUNTER — Encounter (HOSPITAL_COMMUNITY): Payer: Self-pay | Admitting: Radiology

## 2014-08-15 ENCOUNTER — Encounter (HOSPITAL_COMMUNITY)
Admission: AD | Disposition: A | Discharge status: Home Health | Payer: Self-pay | Source: Ambulatory Visit | Attending: Hematology and Oncology

## 2014-08-15 DIAGNOSIS — K259 Gastric ulcer, unspecified as acute or chronic, without hemorrhage or perforation: Secondary | ICD-10-CM

## 2014-08-15 HISTORY — PX: ESOPHAGOGASTRODUODENOSCOPY: SHX5428

## 2014-08-15 LAB — URINE CULTURE: Colony Count: 50000

## 2014-08-15 LAB — GLUCOSE, CAPILLARY
GLUCOSE-CAPILLARY: 108 mg/dL — AB (ref 70–99)
GLUCOSE-CAPILLARY: 107 mg/dL — AB (ref 70–99)
Glucose-Capillary: 94 mg/dL (ref 70–99)
Glucose-Capillary: 82 mg/dL (ref 70–99)

## 2014-08-15 LAB — LIPASE, BLOOD: Lipase: 16 U/L (ref 11–59)

## 2014-08-15 SURGERY — EGD (ESOPHAGOGASTRODUODENOSCOPY)
Anesthesia: Moderate Sedation

## 2014-08-15 MED ORDER — DIPHENHYDRAMINE HCL 50 MG/ML IJ SOLN
INTRAMUSCULAR | Status: AC
Start: 2014-08-15 — End: 2014-08-15
  Filled 2014-08-15: qty 1

## 2014-08-15 MED ORDER — SODIUM CHLORIDE 0.9 % IV SOLN
8.0000 mg/h | INTRAVENOUS | Status: AC
  Administered 2014-08-15 – 2014-08-18 (×7): 8 mg/h via INTRAVENOUS
  Filled 2014-08-15 (×17): qty 80

## 2014-08-15 MED ORDER — MIDAZOLAM HCL 10 MG/2ML IJ SOLN
INTRAMUSCULAR | Status: AC
  Filled 2014-08-15: qty 2

## 2014-08-15 MED ORDER — IOHEXOL 300 MG/ML  SOLN
100.0000 mL | Freq: Once | INTRAMUSCULAR | Status: AC | PRN
  Administered 2014-08-15: 100 mL via INTRAVENOUS

## 2014-08-15 MED ORDER — FENTANYL CITRATE 0.05 MG/ML IJ SOLN
INTRAMUSCULAR | Status: DC | PRN
Start: 2014-08-15 — End: 2014-08-15
  Administered 2014-08-15: 25 ug via INTRAVENOUS

## 2014-08-15 MED ORDER — FENTANYL CITRATE 0.05 MG/ML IJ SOLN
INTRAMUSCULAR | Status: AC
  Filled 2014-08-15: qty 2

## 2014-08-15 MED ORDER — SODIUM CHLORIDE 0.9 % IV SOLN: INTRAVENOUS | Status: DC

## 2014-08-15 MED ORDER — PANTOPRAZOLE SODIUM 40 MG IV SOLR
40.0000 mg | Freq: Two times a day (BID) | INTRAVENOUS | Status: DC
  Filled 2014-08-15: qty 40

## 2014-08-15 MED ORDER — PANTOPRAZOLE SODIUM 40 MG IV SOLR
80.0000 mg | Freq: Once | INTRAVENOUS | Status: AC
  Administered 2014-08-15: 80 mg via INTRAVENOUS
  Filled 2014-08-15: qty 80

## 2014-08-15 MED ORDER — BUTAMBEN-TETRACAINE-BENZOCAINE 2-2-14 % EX AERO
INHALATION_SPRAY | CUTANEOUS | Status: DC | PRN
  Administered 2014-08-15: 1 via TOPICAL

## 2014-08-15 MED ORDER — MIDAZOLAM HCL 10 MG/2ML IJ SOLN
INTRAMUSCULAR | Status: DC | PRN
  Administered 2014-08-15: 2 mg via INTRAVENOUS

## 2014-08-15 NOTE — H&P (View-Only) (Signed)
Patient ID: Evan [name], male   DOB: Sep 08, 1941, 73 y.o.   MRN: [id number]  CT results noted. EGD today to further evaluate for gastritis and look for gastric outlet obstruction and peptic ulcer disease.

## 2014-08-15 NOTE — Interval H&P Note (Signed)
History and Physical Interval Note:  08/15/2014 1:05 PM  Evan [name]  has presented today for surgery, with the diagnosis of nausea/vomiting  The various methods of treatment have been discussed with the patient and family. After consideration of risks, benefits and other options for treatment, the patient has consented to  Procedure(s): ESOPHAGOGASTRODUODENOSCOPY (EGD) (N/A) as a surgical intervention .  The patient's history has been reviewed, patient examined, no change in status, stable for surgery.  I have reviewed the patient's chart and labs.  Questions were answered to the patient's satisfaction.     [place] C.

## 2014-08-15 NOTE — Brief Op Note (Signed)
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 See endopro for details.

## 2014-08-15 NOTE — Op Note (Addendum)
[facility] [place], [id number]   ENDOSCOPY PROCEDURE REPORT  PATIENT: Evan [name], Evan [name]  MR#: [id number] BIRTHDATE: 1941/07/29 , 73  yrs. old GENDER: male ENDOSCOPIST: Wilford Corner, MD REFERRED BY:  hospital team PROCEDURE DATE:  08-26-14 PROCEDURE:  EGD w/ biopsy ASA CLASS:     Class III INDICATIONS:  vomiting and nausea. MEDICATIONS: Fentanyl 25 mcg IV and Versed 2 mg IV TOPICAL ANESTHETIC: Cetacaine Spray  DESCRIPTION OF PROCEDURE: After the risks benefits and alternatives of the procedure were thoroughly explained, informed consent was obtained.  The    endoscope was introduced through the mouth and advanced to the bulb of duodenum , Without limitations.  The instrument was slowly withdrawn as the mucosa was fully examined.    Distal erosive esophagitis seen and scattered tiny white plaques seen in mid-esophagus. In the distal stomach an enormous cratered ulcer (covering the entire posterior wall of the antrum) seen with black eschar along most of the ulcer base with surrounding edema and nodular mucosa. A few smaller ulcers also noted in the antrum. Upon entering the duodenal bulb a large ulcer on the anterior wall seen and due to the size of these ulcers the endoscope was not advanced into the 2nd portion of the duodenum. No active bleeding seen.      Retroflexed views revealed no abnormalities.    Biopsies taken of the edematous antral mucosa for histology. The scope was then withdrawn from the patient and the procedure completed.  COMPLICATIONS: There were no immediate complications.  ENDOSCOPIC IMPRESSION:     1. 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 Duodenal bulb ulcer 3. Erosive esophagitis 4. Candida esophagitis  RECOMMENDATIONS:     Strict NPO; Protonix drip;  F/U on path    eSigned:  Wilford Corner, MD 08/26/2014 1:46 PM Revised:  2014-08-26 1:46 PM   CC:  CPT CODES: ICD CODES:  The ICD and CPT codes recommended by this software are interpretations from the data that the clinical staff has captured with the software.  The verification of the translation of this report to the ICD and CPT codes and modifiers is the sole responsibility of the health care institution and practicing physician where this report was generated.  [employer] will not be held responsible for the validity of the ICD and CPT codes included on this report.  AMA assumes no liability for data contained or not contained herein. CPT is a Designer, television/film set of the [employer].  PATIENT NAME:  Evan [name], Evan [name] MR#: [id number]

## 2014-08-15 NOTE — Progress Notes (Signed)
Patient ID: Evan [name], male   DOB: 19-Jan-1941, 73 y.o.   MRN: [id number]  CT results noted. EGD today to further evaluate for gastritis and look for gastric outlet obstruction and peptic ulcer disease.

## 2014-08-15 NOTE — Progress Notes (Signed)
Evan [name]   DOB:March 16, 1941   TF#:[id number]   YHC#:[id number]  I have seen the patient, examined him and edited the notes as follows  Subjective: Patient continues to have nausea, but no emesis. He complains of intermittent dry heaves. No further diarrhea. He is very weak and ill appearing. Right abdominal pain better controlled with current meds. Appetite decreased due to symptoms (he is NPO at this time for anticipated procedures). GI consulted patient on 9/29, suspecting that these symptoms may be due to for mass effect on the stomach or development of carcinomatosis causing these symptoms, with an ulcer being less likely. Denies cardiac or respiratory complaints.  Follows commands, no confusion noted.  CT scan showed abnormal gastritis. EGD confirmed large ulceration, related to side effects from radiation.  Scheduled Meds: . enoxaparin (LOVENOX) injection  40 mg Subcutaneous Q24H  . feeding supplement (RESOURCE BREEZE)  1 Container Oral BID BM  . insulin aspart  0-24 Units Subcutaneous TID WC  . morphine  30 mg Oral Q12H  . ondansetron (ZOFRAN) IV  8 mg Intravenous 3 times per day   Continuous Infusions: . 0.9 % NaCl with KCl 20 mEq / L 125 mL/hr at 08/15/14 0500   PRN Meds:acetaminophen, HYDROmorphone (DILAUDID) injection, HYDROmorphone   Objective:  Filed Vitals:   08/15/14 0503  BP: 119/71  Pulse: 77  Temp: 98.4 F (36.9 C)  Resp: 20      Intake/Output Summary (Last 24 hours) at 08/15/14 0754 Last data filed at 08/14/14 1056  Gross per 24 hour  Intake      0 ml  Output    250 ml  Net   -250 ml    ECOG PERFORMANCE STATUS: 3  GENERAL:alert, no distress and uncomfortable due to nausea, and pain. Ill appearing. SKIN: skin color, texture, turgor are normal, no rashes or significant lesions EYES: normal, conjunctiva are pink and non-injected, sclera clear OROPHARYNX:no exudate, no erythema and lips, buccal mucosa, and tongue normal. Oral mucosa moist NECK: supple,  thyroid normal size, non-tender, without nodularity LYMPH:  no palpable lymphadenopathy in the cervical, axillary or inguinal LUNGS: clear to auscultation and percussion with normal breathing effort HEART: regular rate & rhythm and no murmurs and no lower extremity edema ABDOMEN :abdomen soft, diffuse tenderness noted, non distended, bowel sounds present.  Musculoskeletal:no cyanosis of digits and no clubbing  PSYCH: alert & oriented x 3 with fluent speech NEURO: no focal motor/sensory deficits    CBG (last 3)   Recent Labs  08/14/14 1710 08/14/14 2104 08/15/14 0728  GLUCAP 130* 147* 94     Labs:   Recent Labs Lab 08/13/14 1344  WBC 13.3*  HGB 10.5*  HCT 31.6*  PLT 376  MCV 100.9*  MCH 33.6*  MCHC 33.3  RDW 21.4*  LYMPHSABS 0.7*  MONOABS 1.3*  EOSABS 0.0  BASOSABS 0.1     Chemistries:    Recent Labs Lab 08/13/14 1344 08/14/14 0515  NA 140 141  K 3.3* 3.8  CL  --  99  CO2 25 25  GLUCOSE 110 119*  BUN 9.1 12  CREATININE 0.9 0.88  CALCIUM 9.7 8.8  AST 35* 25  ALT 24 19  ALKPHOS 262* 235*  BILITOT 1.06 0.8    GFR Estimated Creatinine Clearance: 98 ml/min (by C-G formula based on Cr of 0.88).  Liver Function Tests:  Recent Labs Lab 08/13/14 1344 08/14/14 0515  AST 35* 25  ALT 24 19  ALKPHOS 262* 235*  BILITOT 1.06 0.8  PROT  6.5 5.9*  ALBUMIN 2.8* 2.6*  CBG:  Recent Labs Lab 08/14/14 0801 08/14/14 1136 08/14/14 1710 08/14/14 2104 08/15/14 0728  GLUCAP 118* 133* 130* 147* 94     Imaging Studies:  Acute Abdominal Series  08/13/2014   CLINICAL DATA:  Abdominal pain with nausea and diarrhea  EXAM: ACUTE ABDOMEN SERIES (ABDOMEN 2 VIEW & CHEST 1 VIEW)  COMPARISON:  Chest radiograph June 14, 2014 ; CT abdomen and pelvis September 09, 2010  FINDINGS: PA chest: No edema or consolidation. Heart is upper normal in size with pulmonary vascularity within normal limits. Port-A-Cath tip is in the superior vena cava. No adenopathy.  Supine and left  lateral decubitus images were obtained. There is a biliary regions stent. There are surgical clips in right upper quadrant. There is moderate stool in the colon. There is no appreciable bowel dilatation or air-fluid level to suggest obstruction. There is no demonstrable free air. There are vascular calcifications in the pelvis.  IMPRESSION: Bowel gas pattern unremarkable. No obstruction or free air seen. No lung edema or consolidation.   Electronically Signed   By: Lowella Grip M.D.   On: 08/13/2014 17:40   CT scan was reviewed with patient and family. Assessment/Plan: 73 y.o.  #1 uncontrolled nausea, vomiting and dehydration   On admission, he was placed on IV fluids at 125 cc/hr along with round the clock intravenous anti-emetics.  Recent abdominal x-ray did not show evidence of bowel obstruction. GI consultation was obtained on 9/29. CT scan showed gastritis with no signs of disease progression. EGD showed ulceration. Recommend n.p.o., continue IV fluids and antiemetics.  #2 leukocytosis  Likely due to stress response. Cultures to exclude infection are pending  #3 abdominal distention, pain and diarrhea.  Stool studies to exclude infection were performed on admission. C difficile negative.  Abdominal x-ray on 9/28 was negative for bowel obstruction.  Given pain medicine as needed intravenously, as prescribed. Patient's diarrhea subsided as of 9/30, no Imodium is indicated at this time Continue IV fluid resuscitation until able to tolerate formed foods. He is on NPO by GI until studies completed.  #4 anemia  Persistent anemia of neoplastic disease. He is not symptomatic.  Would transfuse if hemoglobin drops to less than 8 g.   #5 pancreatic cancer  CA 19-9 was drawn on 9/29 at 259.4 (was 2197.1 in August 08/2014), thus, significantly reduced. CT scan showed no disease progression. Continue supportive care.  #6 DVT prophylaxis  On Lovenox   #7 hypokalemia  This is due to  diarrhea.  Potasium levels have been normalized on 9/28  at 3.8 after  IV replenishment   #8 protein calorie malnutrition  He was started on liquid diet (NPO until procedures).  Nutritional consult was obtained on 9/29 recommending Resource Breeze po BID,bland, easy to tolerate foods. Follow up appreciated Placed on n.p.o. 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   #10 discharge planning  The patient is readmitted to due to recent premature dismissal last week.  Goals of care is discussed with the patient. I will discharge him once he is able to tolerate oral intake without nausea or vomiting.   #11 status  Full code  #12 Gastric ulcer The patient is placed on high-dose Protonix.  **Disclaimer: This note was dictated with voice recognition software. Similar sounding words can inadvertently be transcribed and this note  may contain transcription errors which may not have been corrected upon publication of note.Sharene Butters E, PA-C 08/15/2014  7:54 AM Evan Cauthon, MD 08/15/2014

## 2014-08-16 ENCOUNTER — Encounter (HOSPITAL_COMMUNITY): Payer: Self-pay | Admitting: Gastroenterology

## 2014-08-16 ENCOUNTER — Inpatient Hospital Stay (HOSPITAL_COMMUNITY): Payer: Medicare Other

## 2014-08-16 DIAGNOSIS — K259 Gastric ulcer, unspecified as acute or chronic, without hemorrhage or perforation: Principal | ICD-10-CM

## 2014-08-16 DIAGNOSIS — R112 Nausea with vomiting, unspecified: Secondary | ICD-10-CM

## 2014-08-16 DIAGNOSIS — K5900 Constipation, unspecified: Secondary | ICD-10-CM

## 2014-08-16 DIAGNOSIS — E46 Unspecified protein-calorie malnutrition: Secondary | ICD-10-CM

## 2014-08-16 DIAGNOSIS — D630 Anemia in neoplastic disease: Secondary | ICD-10-CM

## 2014-08-16 DIAGNOSIS — R140 Abdominal distension (gaseous): Secondary | ICD-10-CM

## 2014-08-16 DIAGNOSIS — C250 Malignant neoplasm of head of pancreas: Secondary | ICD-10-CM

## 2014-08-16 DIAGNOSIS — E876 Hypokalemia: Secondary | ICD-10-CM

## 2014-08-16 DIAGNOSIS — D72819 Decreased white blood cell count, unspecified: Secondary | ICD-10-CM

## 2014-08-16 DIAGNOSIS — E1142 Type 2 diabetes mellitus with diabetic polyneuropathy: Secondary | ICD-10-CM

## 2014-08-16 DIAGNOSIS — E860 Dehydration: Secondary | ICD-10-CM

## 2014-08-16 LAB — COMPREHENSIVE METABOLIC PANEL
ALK PHOS: 209 U/L — AB (ref 39–117)
ALT: 14 U/L (ref 0–53)
AST: 21 U/L (ref 0–37)
Albumin: 2.4 g/dL — ABNORMAL LOW (ref 3.5–5.2)
Anion gap: 13 (ref 5–15)
BILIRUBIN TOTAL: 0.5 mg/dL (ref 0.3–1.2)
BUN: 8 mg/dL (ref 6–23)
CHLORIDE: 99 meq/L (ref 96–112)
CO2: 23 meq/L (ref 19–32)
CREATININE: 0.87 mg/dL (ref 0.50–1.35)
Calcium: 8.2 mg/dL — ABNORMAL LOW (ref 8.4–10.5)
GFR calc Af Amer: >90 mL/min (ref >90)
GFR, EST NON AFRICAN AMERICAN: 85 mL/min — AB (ref >90)
Glucose, Bld: 96 mg/dL (ref 70–99)
Potassium: 4.2 mEq/L (ref 3.7–5.3)
SODIUM: 135 meq/L — AB (ref 137–147)
Total Protein: 5.3 g/dL — ABNORMAL LOW (ref 6.0–8.3)

## 2014-08-16 LAB — GLUCOSE, CAPILLARY
GLUCOSE-CAPILLARY: 176 mg/dL — AB (ref 70–99)
Glucose-Capillary: 139 mg/dL — ABNORMAL HIGH (ref 70–99)
Glucose-Capillary: 76 mg/dL (ref 70–99)
Glucose-Capillary: 92 mg/dL (ref 70–99)

## 2014-08-16 LAB — CBC
HEMATOCRIT: 26.3 % — AB (ref 39.0–52.0)
HEMOGLOBIN: 8.8 g/dL — AB (ref 13.0–17.0)
MCH: 34 pg (ref 26.0–34.0)
MCHC: 33.5 g/dL (ref 30.0–36.0)
MCV: 101.5 fL — ABNORMAL HIGH (ref 78.0–100.0)
Platelets: 369 10*3/uL (ref 150–400)
RBC: 2.59 MIL/uL — ABNORMAL LOW (ref 4.22–5.81)
RDW: 20.6 % — ABNORMAL HIGH (ref 11.5–15.5)
WBC: 7.7 10*3/uL (ref 4.0–10.5)

## 2014-08-16 LAB — MAGNESIUM: Magnesium: 1.5 mg/dL (ref 1.5–2.5)

## 2014-08-16 LAB — PHOSPHORUS: Phosphorus: 1.9 mg/dL — ABNORMAL LOW (ref 2.3–4.6)

## 2014-08-16 MED ORDER — FAT EMULSION 20 % IV EMUL
250.0000 mL | INTRAVENOUS | Status: AC
  Administered 2014-08-16: 250 mL via INTRAVENOUS
  Filled 2014-08-16: qty 250

## 2014-08-16 MED ORDER — DEXTROSE 5 % IV SOLN
INTRAVENOUS | Status: AC
  Administered 2014-08-16 (×2): via INTRAVENOUS

## 2014-08-16 MED ORDER — TRACE MINERALS CR-CU-F-FE-I-MN-MO-SE-ZN IV SOLN
INTRAVENOUS | Status: AC
  Administered 2014-08-16: 18:00:00 via INTRAVENOUS
  Filled 2014-08-16: qty 1000

## 2014-08-16 MED ORDER — INSULIN ASPART 100 UNIT/ML ~~LOC~~ SOLN
0.0000 [IU] | Freq: Four times a day (QID) | SUBCUTANEOUS | Status: DC
  Administered 2014-08-16 – 2014-08-17 (×2): 4 [IU] via SUBCUTANEOUS
  Administered 2014-08-17 (×2): 2 [IU] via SUBCUTANEOUS
  Administered 2014-08-17: 8 [IU] via SUBCUTANEOUS
  Administered 2014-08-18: 16 [IU] via SUBCUTANEOUS
  Administered 2014-08-18 (×4): 8 [IU] via SUBCUTANEOUS
  Administered 2014-08-19 (×2): 12 [IU] via SUBCUTANEOUS
  Administered 2014-08-19: 8 [IU] via SUBCUTANEOUS
  Administered 2014-08-20 (×2): 12 [IU] via SUBCUTANEOUS
  Administered 2014-08-20 – 2014-08-21 (×4): 8 [IU] via SUBCUTANEOUS
  Administered 2014-08-21: 12 [IU] via SUBCUTANEOUS
  Administered 2014-08-21: 8 [IU] via SUBCUTANEOUS
  Administered 2014-08-22: 4 [IU] via SUBCUTANEOUS
  Administered 2014-08-22: 12 [IU] via SUBCUTANEOUS
  Administered 2014-08-22: 16 [IU] via SUBCUTANEOUS
  Administered 2014-08-22 – 2014-08-23 (×2): 20 [IU] via SUBCUTANEOUS
  Administered 2014-08-23: 4 [IU] via SUBCUTANEOUS
  Administered 2014-08-23: 12 [IU] via SUBCUTANEOUS

## 2014-08-16 MED ORDER — SODIUM CHLORIDE 0.9 % IV SOLN
8.0000 mg | Freq: Three times a day (TID) | INTRAVENOUS | Status: DC | PRN
  Filled 2014-08-16: qty 4

## 2014-08-16 MED ORDER — FLUCONAZOLE 100MG IVPB
100.0000 mg | INTRAVENOUS | Status: AC
  Administered 2014-08-16 – 2014-08-22 (×7): 100 mg via INTRAVENOUS
  Filled 2014-08-16 (×7): qty 50

## 2014-08-16 MED ORDER — SODIUM CHLORIDE 0.9 % IV SOLN
INTRAVENOUS | Status: AC
  Administered 2014-08-17: 12:00:00 via INTRAVENOUS

## 2014-08-16 NOTE — Progress Notes (Signed)
NUTRITION FOLLOW UP  Intervention:   -TPN management per pharmacy- pt at high refeeding risk d/t minimal PO intake for > one month, and severe weight loss. Monitor Phos/Mg/K and replete as warranted -Diet advancement per MD; recommend Bland diet for assistance in tolerance and promote gastric ulcer healing -Recommend peach Resource Breeze po BID, each supplement provides 250 kcal and 9 grams of protein as tolerated -RD to continue to monitor   Nutrition Dx:   Inadequate oral intake related to nausea/vomiting/abd pain as evidenced by PO intake < 75%, ongoing weight loss; ongoing    Goal:   Pt to meet >/= 90% of their estimated nutrition needs   New Goal: TPN + PO intake to meet >/= 90% estimated nutrition needs  Monitor:   TPN tolerance, GI profile, total protein/kcal intake, labs, weights, supplement tolerance, education needs (ulcer nutrition therapy)  Assessment:   9/29: Pt known to RD from previous assessment on 08/08/2014. Endorsed poor PO for past 5 weeks d/t nausea, vomiting and early satiety. Had been trying different nutrition supplements, Glucerna or Boost Glucerna Control; however pt had been refusing them. Trialed MagicCup protein supplement for alternative;however pt reported disliking taste. PO intake was approximately 50%  -Pt reported tolerating minimal intake since d/c. Can consume some fluids and broth-based soups. Has not been drinking nutrition supplements as they exacerbate nausea and vomiting; however did note he tolerated them when used as a milkshake.  -Offered to prepare daily milkshakes for pt; declined as he was still experiencing nausea. Has only been able to tolerate beef broth.  -Was willing to trial [employer] supplement. Monitor CBGS and modify to milkshake/Ensure supplement as tolerated  -Pt continues to lose weight. Has lost 7 lbs in past two weeks (3% body weight loss, severe for time frame), and 25 lbs in past 3 months (11% body weight loss, severe  for time frame)  -Pt w/loose stools, C.diff results pending  10/01: -Per discussion with wife, pt continues with minimal intake (sips of Sprite) d/t nausea and NPO status for procedures. 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 TPN to be initiated to promote ulcer healing and d/t prolonged period of intolerance to PO intake/enteral nutrition. -Pt continues to be NPO for abd xray w/plan to advance to clear liquid diet after.  -Wife reported pt requesting food, which is an improvement over past month.  Requesting foods such as jello and popsicles, and RD to order [employer] for additional nutrition. Receiving diflucan and protonix, which wife noted has improved pt's nausea and promoted appetite -Per discussion with RN, pt has port access for TPN access, will likely be initiated today -Discussed pt's high risk of refeeding. with pharmacy. Plan for conservative advancement of TPN to goal rate- Pharm recommended goal of  Clinimix E 5/20 at 90 ml/hr with 20% lipids at 10 ml/hr to provide approximately 2380 kcal, and 108 gram protein (100% est kcal and protein needs)  -Pharm also to modify dextrose to saline to assist in controlling blood glucose -CBG <150 mg/dl -Refeeding labs pending    Height: Ht Readings from Last 1 Encounters:  08/13/14 6\' 2"  (1.88 m)    Weight Status:   Wt Readings from Last 1 Encounters:  08/13/14 223 lb 15.8 oz (101.6 kg)    Re-estimated needs:  Kcal: 2300-2500  Protein: 105-120  Fluid: 2300 ml daily   Skin: +1 RLE edema, +2 LLE  edema  Diet Order: NPO   Intake/Output Summary (Last 24 hours) at 08/16/14 1335 Last data filed at 08/16/14 0552  Gross per 24 hour  Intake 2364.2 ml  Output    200 ml  Net 2164.2 ml    Last BM: 9/29   Labs:   Recent Labs Lab 08/13/14 1344 08/14/14 0515  NA 140 141  K 3.3* 3.8  CL  --  99   CO2 25 25  BUN 9.1 12  CREATININE 0.9 0.88  CALCIUM 9.7 8.8  GLUCOSE 110 119*    CBG (last 3)   Recent Labs  08/15/14 2125 08/16/14 0712 08/16/14 1228  GLUCAP 82 76 92    Scheduled Meds: . enoxaparin (LOVENOX) injection  40 mg Subcutaneous Q24H  . feeding supplement (RESOURCE BREEZE)  1 Container Oral BID BM  . fluconazole (DIFLUCAN) IV  100 mg Intravenous Q24H  . insulin aspart  0-24 Units Subcutaneous 4 times per day  . [START ON 08/19/2014] pantoprazole (PROTONIX) IV  40 mg Intravenous Q12H    Continuous Infusions: . dextrose 75 mL/hr at 08/16/14 0825  . pantoprozole (PROTONIX) infusion 8 mg/hr (08/16/14 1300)    [place] LDN Clinical Dietitian [phone]

## 2014-08-16 NOTE — Progress Notes (Signed)
PARENTERAL NUTRITION CONSULT NOTE - INITIAL  Pharmacy Consult for TNA Indication: intolerance to enteral feeding due to massive antral ulcer  Allergies  Allergen Reactions  . Ace Inhibitors   . Ciprofloxacin Rash    Patient Measurements: Height: '6\' 2"'  (188 cm) Weight: 223 lb 15.8 oz (101.6 kg) IBW/kg (Calculated) : 82.2  Vital Signs: Temp: 98.2 F (36.8 C) (10/01 0433) Temp src: Oral (10/01 0433) BP: 134/66 mmHg (10/01 0433) Pulse Rate: 72 (10/01 0433) Intake/Output from previous day: 09/30 0701 - 10/01 0700 In: 2364.2 [I.V.:2311; IV Piggyback:53.2] Out: 200 [Urine:200] Intake/Output from this shift:    Labs:  Recent Labs  08/13/14 1344  WBC 13.3*  HGB 10.5*  HCT 31.6*  PLT 376     Recent Labs  08/13/14 1344 08/14/14 0515  NA 140 141  K 3.3* 3.8  CL  --  99  CO2 25 25  GLUCOSE 110 119*  BUN 9.1 12  CREATININE 0.9 0.88  CALCIUM 9.7 8.8  PROT 6.5 5.9*  ALBUMIN 2.8* 2.6*  AST 35* 25  ALT 24 19  ALKPHOS 262* 235*  BILITOT 1.06 0.8   Estimated Creatinine Clearance: 98 ml/min (by C-G formula based on Cr of 0.88).    Recent Labs  08/15/14 2125 08/16/14 0712 08/16/14 1228  GLUCAP 82 76 92    Medical History: Past Medical History  Diagnosis Date  . Chronic back pain greater than 3 months duration   . Bulging discs   . Diabetes mellitus     type II; neuropathy;   . Hypertension   . High cholesterol   . Leukocytosis   . GERD (gastroesophageal reflux disease)   . Pancreatitis 2004  . Anxiety   . DDD (degenerative disc disease) 2005    lumbar spine  . History of smoking 08/01/2012  . Weight loss 08/01/2012  . Cough 08/01/2012  . 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 Left leg pain 08/05/2012  . 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 Chronic pancreatitis   . Pancreatic cancer 06/08/2014  . OSA (obstructive sleep apnea) 2010    does not use CPAP  . Nausea alone 06/13/2014  . Allergy   . Mucositis (ulcerative) due to antineoplastic therapy  07/02/2014  . Constipated 07/16/2014    Medications:  Scheduled:  . enoxaparin (LOVENOX) injection  40 mg Subcutaneous Q24H  . feeding supplement (RESOURCE BREEZE)  1 Container Oral BID BM  . fluconazole (DIFLUCAN) IV  100 mg Intravenous Q24H  . insulin aspart  0-24 Units Subcutaneous 4 times per day  . [START ON 08/19/2014] pantoprazole (PROTONIX) IV  40 mg Intravenous Q12H   Infusions:  . dextrose 75 mL/hr at 08/16/14 0825  . pantoprozole (PROTONIX) infusion 8 mg/hr (08/16/14 1300)    Insulin Requirements in the past 24 hours:  0 units custom resistant SSI used  Current Nutrition:  NPO Resource breeze BID- last charted 9/30 AM  IVF: D5W @ 75 ml/hr  ASSESSMENT  HPI: 74 yo obese man with pancreatic cancer, CML and recent chemoradiation who was found to have a massive cratered distal gastric ulcer on EGD yesterday and also had a 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 Biopsies from the antrum were NEGATIVE for cancer. Due to prolonged inability to tolerate adequate enteral nutrition, while this gastric ulcer heals, Pharmacy has been consulted to start TNA. Noted history of CKD and DM2.   Significant events:  10/1: noted abdominal X-ray to be performed, if no free air, patient will be given a trial  of clear liquids  Today: 10/1  Glucose: 76-107 Electrolytes: WNL on 9/29 Renal: SCr 0.88 on 9/29  LFTs: Alk Phos increased to 235, alb low at 2.6, Tbili WNL, LFTs WNL on 9/28 TGs: ordered Prealbumin: ordered   Nutritional Goals:  Kcal: 2300-2500  Protein: 105-120  Fluid: 2300 ml daily  Clinimix E 5/20 at a goal rate of 90 ml/hr with 20% fat emulsion at 10 ml/hr will provide 2380 kcal/d, 108 gm protein/d, 2160 ml/d   Plan:  At 1800 today:  Start Clinimix E 5/20 at 40 ml/hr.  Start 20% fat emulsion at 36m/hr.  Plan to advance as tolerated to the goal rate. Watch for refeeding syndrome  TNA to contain standard multivitamins and trace elements.  Change IVF to  NS @ 50 ml/hr  Continue custom resistant scale SSI and CBG checks q6h TNA lab panels on Mondays & Thursdays.  F/u daily.  Thank you for the consult.  JCurrie Paris PharmD, BCPS Pager: [phone]: [phone]/11/2013 2:06 PM

## 2014-08-16 NOTE — Progress Notes (Signed)
Evan [name]   DOB:05/24/1941   DQ#:[id number]   JJH#:[id number]  I have seen the patient, examined him and edited the notes as follows  Subjective:  He continues to be NPO. He had an upper endoscopy by Dr. Michail Sermon on 9/30 remarkable for a massive antral ulcer, possibly causing functional obstruction for the pylorus, likely induced by radiation. A duodenal bulb ulcer with erosive and candida esophagitis were noted. He was placed on Protonix drip with better symptom control.He reports less nausea this morning, occasional dry heaves, emesis and diarrhea resolved. Abdominal pain is only present at the left epigastric area. Denies cardiac or respiratory complaints. Follows commands, no confusion noted.    Scheduled Meds: . enoxaparin (LOVENOX) injection  40 mg Subcutaneous Q24H  . feeding supplement (RESOURCE BREEZE)  1 Container Oral BID BM  . insulin aspart  0-24 Units Subcutaneous TID WC  . morphine  30 mg Oral Q12H  . ondansetron (ZOFRAN) IV  8 mg Intravenous 3 times per day  . [START ON 08/19/2014] pantoprazole (PROTONIX) IV  40 mg Intravenous Q12H   Continuous Infusions: . 0.9 % NaCl with KCl 20 mEq / L 125 mL/hr at 08/16/14 0432  . pantoprozole (PROTONIX) infusion 8 mg/hr (08/16/14 0202)   PRN Meds:acetaminophen, HYDROmorphone (DILAUDID) injection, HYDROmorphone   Objective:  Filed Vitals:   08/16/14 0433  BP: 134/66  Pulse: 72  Temp: 98.2 F (36.8 C)  Resp: 16      Intake/Output Summary (Last 24 hours) at 08/16/14 8185 Last data filed at 08/16/14 6314  Gross per 24 hour  Intake 2364.2 ml  Output    200 ml  Net 2164.2 ml    ECOG PERFORMANCE STATUS: 2-3  GENERAL:alert, no distress and uncomfortable due to nausea, and pain. Ill appearing. SKIN: skin color, texture, turgor are normal, no rashes or significant lesions EYES: normal, conjunctiva are pink and non-injected, sclera clear OROPHARYNX:no exudate, no erythema and lips, buccal mucosa, and tongue normal. Oral  mucosa moist NECK: supple, thyroid normal size, non-tender, without nodularity LYMPH:  no palpable lymphadenopathy in the cervical, axillary or inguinal LUNGS: clear to auscultation and percussion with normal breathing effort HEART: regular rate & rhythm and no murmurs and trace of lower extremity edema ABDOMEN :abdomen soft,  non distended, tender at the epigastric and left upper quadrant; bowel sounds present.  Musculoskeletal:no cyanosis of digits and no clubbing  PSYCH: alert & oriented x 3 with fluent speech NEURO: no focal motor/sensory deficits    CBG (last 3)   Recent Labs  08/15/14 1147 08/15/14 1748 08/15/14 2125  GLUCAP 108* 107* 82     Labs:   Recent Labs Lab 08/13/14 1344  WBC 13.3*  HGB 10.5*  HCT 31.6*  PLT 376  MCV 100.9*  MCH 33.6*  MCHC 33.3  RDW 21.4*  LYMPHSABS 0.7*  MONOABS 1.3*  EOSABS 0.0  BASOSABS 0.1     Chemistries:    Recent Labs Lab 08/13/14 1344 08/14/14 0515  NA 140 141  K 3.3* 3.8  CL  --  99  CO2 25 25  GLUCOSE 110 119*  BUN 9.1 12  CREATININE 0.9 0.88  CALCIUM 9.7 8.8  AST 35* 25  ALT 24 19  ALKPHOS 262* 235*  BILITOT 1.06 0.8    GFR Estimated Creatinine Clearance: 98 ml/min (by C-G formula based on Cr of 0.88).  Liver Function Tests:  Recent Labs Lab 08/13/14 1344 08/14/14 0515  AST 35* 25  ALT 24 19  ALKPHOS 262* 235*  BILITOT 1.06 0.8  PROT 6.5 5.9*  ALBUMIN 2.8* 2.6*  CBG:  Recent Labs Lab 08/14/14 2104 08/15/14 0728 08/15/14 1147 08/15/14 1748 08/15/14 2125  GLUCAP 147* 94 108* 107* 82     Imaging Studies:  Ct Abdomen Pelvis W Contrast  08/15/2014   CLINICAL DATA:  CML, on going oral chemotherapy. Pancreatic cancer. Decreased appetite. Right abdominal pain, nausea.  EXAM: CT ABDOMEN AND PELVIS WITH CONTRAST  TECHNIQUE: Multidetector CT imaging of the abdomen and pelvis was performed using the standard protocol following bolus administration of intravenous contrast.  CONTRAST:  15mL  OMNIPAQUE IOHEXOL 300 MG/ML  SOLN  COMPARISON:  CT 09/09/2010.  MRI 06/05/2014.  CT chest 06/06/2014.  FINDINGS: Small bilateral pleural effusions with compressive atelectasis in the lower lobes, new since prior chest CT. Heart is normal size. Diffuse coronary artery calcifications noted.  Metallic biliary stent is in place with decreasing biliary dilatation. Pneumobilia noted. Cystic area within the pancreatic body measures 1.9 cm. Previously seen pancreatic head mass on MRI difficult to visualize and measure by CT.  There is diffuse gastric wall thickening, most pronounced in the distal stomach. This is compatible with gastritis and may be related to postradiation changes if the patient has received radiation in this area. Spleen, adrenals are unremarkable. No focal hepatic abnormality. Small cysts in the right kidney. Calcifications in the hila bilaterally compatible with renovascular calcifications. Aorta is calcified, non aneurysmal.  Visualized large and small bowel are unremarkable.  IMPRESSION: Interval placement of metallic biliary stent with decreasing biliary duct dilatation. New pneumobilia.  Cystic area within the pancreatic body is stable. The solid pancreatic head mass seen on prior MRI cannot be visualized by CT. No visible local extension. Portal vein is patent.  Diffuse gastric wall thickening, most pronounced in the distal stomach, question radiation gastritis.  Small bilateral pleural effusions with compressive atelectasis in the lower lobes.   Electronically Signed   By: Rolm Baptise M.D.   On: 08/15/2014 09:27   CT scan was reviewed with patient and family. Assessment/Plan: 73 y.o.  #1 uncontrolled nausea, vomiting and dehydration  On admission, he was placed on IV fluids at 125 cc/hr along with round the clock intravenous anti-emetics.  Recent abdominal x-ray did not show evidence of bowel obstruction. GI consultation was obtained on 9/29. CT scan of the abdomen and pelvis on 9/30  showed gastritis with no signs of disease progression. EGD on 9/30 by GI (Dr. Michail Sermon) showed a massive antral ulcer, possibly causing functional obstruction for the pylorus, likely induced by radiation. A duodenal bulb ulcer with erosive and candida esophagitis were noted.Biopsies were taken, path report is pending.  Recommend n.p.o., continue IV fluids. Antiemetics has been discontinued and changed to as needed basis since his nausea has resolved.  #2 leukocytosis with esophageal candidiasis Patient is started on intravenous fluconazole  #3 abdominal distention, pain and diarrhea.  Stool studies to exclude infection were performed on admission. C difficile negative.  Abdominal x-ray on 9/28 was negative for bowel obstruction.  Given pain medicine as needed intravenously, as prescribed. Patient's diarrhea subsided as of 9/30, no Imodium is indicated at this time Continue IV fluid resuscitation until able to tolerate formed foods. He continues to be NPO until GI recommends to advance diet.  #4 Gastric ulcer The patient is placed on high-dose Protonix. Appreciate GI involvement.   #5 anemia  Persistent anemia of neoplastic disease. He is not symptomatic.  Would transfuse if hemoglobin drops to less than 8 g.   #  6 pancreatic cancer  CA 19-9 was drawn on 9/29 at 259.4 (was 2197.1 in August 08/2014), thus, significantly reduced. CT scan showed no disease progression. Continue supportive care.  #7 DVT prophylaxis  On Lovenox   #8 hypokalemia  This is due to diarrhea.  Potasium levels have been normalized on 9/28  at 3.8 after  IV replenishment   #9 protein calorie malnutrition  He was started on liquid diet, changed to NPO for procedures.  Nutritional consult was obtained on 9/29 recommending Resource Breeze po BID,bland, easy to tolerate foods. Follow up appreciated GI recommends that patient remains on NPO until further instructions. He may need total parenteral nutrition if his  symptoms does not improve   #10 diabetes mellitus complicated with peripheral neuropathy, now hypoglycemic Regular insulin dose is on hold On sliding scale insulin as needed due to poor oral intake. . IV fluids has been changed to dextrose infusion  #11 severe constipation Round-the-clock Zofran has been discontinued. He may need enema if no bowel movement by tomorrow  #12 discharge planning  The patient is readmitted to due to recent premature dismissal last week.  Goals of care is discussed with the patient. I will discharge him once he is able to tolerate oral intake without nausea or vomiting.   #13 Code status  Full code  Rondel Jumbo, PA-C 08/16/2014  7:12 AM Evan Tine, MD 08/16/2014

## 2014-08-16 NOTE — Progress Notes (Addendum)
Patient ID: INMER NIX, male   DOB: 1941-01-26, 73 y.o.   MRN: [id number] [facility] Progress Note  ARMAS MCBEE 73 y.o. Mar 31, 1941   Subjective: Sitting up on side of the bed. More alert today. +Nausea without vomiting. Wife at bedside.  Objective: Vital signs in last 24 hours: Filed Vitals:   08/16/14 0433  BP: 134/66  Pulse: 72  Temp: 98.2 F (36.8 C)  Resp: 16    Physical Exam: Gen: alert, no acute distress CV: RRR Chest: Clear anteriorly Abd: epigastric and RUQ tenderness with guarding, soft, nondistended, +BS  Lab Results:  Recent Labs  08/13/14 1344 08/14/14 0515  NA 140 141  K 3.3* 3.8  CL  --  99  CO2 25 25  GLUCOSE 110 119*  BUN 9.1 12  CREATININE 0.9 0.88  CALCIUM 9.7 8.8    Recent Labs  08/13/14 1344 08/14/14 0515  AST 35* 25  ALT 24 19  ALKPHOS 262* 235*  BILITOT 1.06 0.8  PROT 6.5 5.9*  ALBUMIN 2.8* 2.6*    Recent Labs  08/13/14 1344  WBC 13.3*  NEUTROABS 11.3*  HGB 10.5*  HCT 31.6*  MCV 100.9*  PLT 376   No results found for this basename: LABPROT, INR,  in the last 72 hours    Assessment/Plan: 73 yo with pancreatic cancer and recent chemoradiation who was found to have a massive cratered distal gastric ulcer on EGD yesterday and also had a 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 Biopsies from the antrum were NEGATIVE for cancer. Due to prolonged inability to tolerate adequate enteral nutrition, while this gastric ulcer heals, will start TNA (Dr. Alvy Bimler aware). Due to rise in WBC, will do abd Xray and if no free air then will start clear liquids. Agree with IV Diflucan for Candida esophagitis. Continue IV Protonix drip.    Destrehan C. 08/16/2014, 1:20 PM

## 2014-08-17 DIAGNOSIS — E1340 Other specified diabetes mellitus with diabetic neuropathy, unspecified: Secondary | ICD-10-CM

## 2014-08-17 DIAGNOSIS — R109 Unspecified abdominal pain: Secondary | ICD-10-CM

## 2014-08-17 LAB — COMPREHENSIVE METABOLIC PANEL
ALK PHOS: 233 U/L — AB (ref 39–117)
ALT: 14 U/L (ref 0–53)
ANION GAP: 11 (ref 5–15)
AST: 19 U/L (ref 0–37)
Albumin: 2.5 g/dL — ABNORMAL LOW (ref 3.5–5.2)
BUN: 7 mg/dL (ref 6–23)
CALCIUM: 8.9 mg/dL (ref 8.4–10.5)
CO2: 25 mEq/L (ref 19–32)
Chloride: 99 mEq/L (ref 96–112)
Creatinine, Ser: 0.91 mg/dL (ref 0.50–1.35)
GFR, EST NON AFRICAN AMERICAN: 83 mL/min — AB (ref >90)
GLUCOSE: 169 mg/dL — AB (ref 70–99)
Potassium: 3.9 mEq/L (ref 3.7–5.3)
Sodium: 135 mEq/L — ABNORMAL LOW (ref 137–147)
Total Bilirubin: 0.5 mg/dL (ref 0.3–1.2)
Total Protein: 5.7 g/dL — ABNORMAL LOW (ref 6.0–8.3)

## 2014-08-17 LAB — GLUCOSE, CAPILLARY
Glucose-Capillary: 155 mg/dL — ABNORMAL HIGH (ref 70–99)
Glucose-Capillary: 172 mg/dL — ABNORMAL HIGH (ref 70–99)
Glucose-Capillary: 204 mg/dL — ABNORMAL HIGH (ref 70–99)
Glucose-Capillary: 249 mg/dL — ABNORMAL HIGH (ref 70–99)

## 2014-08-17 LAB — DIFFERENTIAL
Basophils Absolute: 0.1 10*3/uL (ref 0.0–0.1)
Basophils Relative: 1 % (ref 0–1)
EOS PCT: 3 % (ref 0–5)
Eosinophils Absolute: 0.2 10*3/uL (ref 0.0–0.7)
LYMPHS ABS: 0.9 10*3/uL (ref 0.7–4.0)
LYMPHS PCT: 12 % (ref 12–46)
Monocytes Absolute: 1 10*3/uL (ref 0.1–1.0)
Monocytes Relative: 14 % — ABNORMAL HIGH (ref 3–12)
Neutro Abs: 5 10*3/uL (ref 1.7–7.7)
Neutrophils Relative %: 70 % (ref 43–77)

## 2014-08-17 LAB — CBC
HCT: 28.4 % — ABNORMAL LOW (ref 39.0–52.0)
Hemoglobin: 9.4 g/dL — ABNORMAL LOW (ref 13.0–17.0)
MCH: 33.7 pg (ref 26.0–34.0)
MCHC: 33.1 g/dL (ref 30.0–36.0)
MCV: 101.8 fL — AB (ref 78.0–100.0)
PLATELETS: 389 10*3/uL (ref 150–400)
RBC: 2.79 MIL/uL — ABNORMAL LOW (ref 4.22–5.81)
RDW: 20.2 % — ABNORMAL HIGH (ref 11.5–15.5)
WBC: 7.1 10*3/uL (ref 4.0–10.5)

## 2014-08-17 LAB — PHOSPHORUS: Phosphorus: 2.1 mg/dL — ABNORMAL LOW (ref 2.3–4.6)

## 2014-08-17 LAB — PREALBUMIN: Prealbumin: 9.1 mg/dL — ABNORMAL LOW (ref 17.0–34.0)

## 2014-08-17 LAB — TRIGLYCERIDES: Triglycerides: 172 mg/dL — ABNORMAL HIGH (ref <150)

## 2014-08-17 LAB — MAGNESIUM: MAGNESIUM: 1.5 mg/dL (ref 1.5–2.5)

## 2014-08-17 MED ORDER — FAT EMULSION 20 % IV EMUL
250.0000 mL | INTRAVENOUS | Status: AC
  Administered 2014-08-17: 250 mL via INTRAVENOUS
  Filled 2014-08-17: qty 250

## 2014-08-17 MED ORDER — SODIUM CHLORIDE 0.9 % IV SOLN: INTRAVENOUS | Status: AC

## 2014-08-17 MED ORDER — TRACE MINERALS CR-CU-F-FE-I-MN-MO-SE-ZN IV SOLN
INTRAVENOUS | Status: AC
  Administered 2014-08-17: 17:00:00 via INTRAVENOUS
  Filled 2014-08-17: qty 2000

## 2014-08-17 MED ORDER — MAGNESIUM SULFATE 40 MG/ML IJ SOLN
2.0000 g | Freq: Once | INTRAMUSCULAR | Status: AC
  Administered 2014-08-17: 2 g via INTRAVENOUS
  Filled 2014-08-17: qty 50

## 2014-08-17 NOTE — Progress Notes (Signed)
NUTRITION FOLLOW UP  Intervention:   -TPN management per pharmacy- pt at high refeeding risk d/t minimal PO intake for > one month, and severe weight loss. Monitor Phos/Mg/K and replete as warranted -Diet advancement per MD; recommend Bland diet for assistance in tolerance and promote gastric ulcer healing -Continue peach Resource Breeze po BID, each supplement provides 250 kcal and 9 grams of protein as tolerated -RD to continue to monitor   Nutrition Dx:   Inadequate oral intake related to nausea/vomiting/abd pain as evidenced by PO intake < 75%, ongoing weight loss; progressing.   Goal:   Pt to meet >/= 90% of their estimated nutrition needs   New Goal: TPN + PO intake to meet >/= 90% estimated nutrition needs- progressing.  Monitor:   TPN tolerance, GI profile, total protein/kcal intake, labs, weights, supplement tolerance, education needs (ulcer nutrition therapy)  Assessment:   9/29: Pt known to RD from previous assessment on 08/08/2014. Endorsed poor PO for past 5 weeks d/t nausea, vomiting and early satiety. Had been trying different nutrition supplements, Glucerna or Boost Glucerna Control; however pt had been refusing them. Trialed MagicCup protein supplement for alternative;however pt reported disliking taste. PO intake was approximately 50%  -Pt reported tolerating minimal intake since d/c. Can consume some fluids and broth-based soups. Has not been drinking nutrition supplements as they exacerbate nausea and vomiting; however did note he tolerated them when used as a milkshake.  -Offered to prepare daily milkshakes for pt; declined as he was still experiencing nausea. Has only been able to tolerate beef broth.  -Was willing to trial [employer] supplement. Monitor CBGS and modify to milkshake/Ensure supplement as tolerated  -Pt continues to lose weight. Has lost 7 lbs in past two weeks (3% body weight loss, severe for time frame), and 25 lbs in past 3 months (11% body  weight loss, severe for time frame)  -Pt w/loose stools, C.diff results pending  10/01: -Per discussion with wife, pt continues with minimal intake (sips of Sprite) d/t nausea and NPO status for procedures. 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 TPN to be initiated to promote ulcer healing and d/t prolonged period of intolerance to PO intake/enteral nutrition. -Pt continues to be NPO for abd xray w/plan to advance to clear liquid diet after.  -Wife reported pt requesting food, which is an improvement over past month.  Requesting foods such as jello and popsicles, and RD to order [employer] for additional nutrition. Receiving diflucan and protonix, which wife noted has improved pt's nausea and promoted appetite -Per discussion with RN, pt has port access for TPN access, will likely be initiated today -Discussed pt's high risk of refeeding. with pharmacy. Plan for conservative advancement of TPN to goal rate- Pharm recommended goal of  Clinimix E 5/20 at 90 ml/hr with 20% lipids at 10 ml/hr to provide approximately 2380 kcal, and 108 gram protein (100% est kcal and protein needs)  -Pharm also to modify dextrose to saline to assist in controlling blood glucose -CBG <150 mg/dl -Refeeding labs pending  10/2: -Clear liquid diet-tolerating well -Resource Breeze finds it very sweet but can drink it.  Discussed adding ginger ale if desired to decrease sweetness. -Phosphorous 1.9 low, Magnesium 1.5 WNL, Potassium:  3.8 today WNL. -TPN (Clinimix 5/20) at 40 ml/hr with 20% lipids at 10 ml per hour providing:  1325 kcal, 48 gm protein  -TPN to  increase to 60 ml/hr with 20% lipids tonight to provide:  1747 kcal, 72 gm protein -TPN goal 90 ml/hr with 20% lipids to provide 2380 kcal and 108 gm protein per day.  Height: Ht Readings from Last 1 Encounters:  08/13/14 6\' 2"  (1.88 m)     Weight Status:   Wt Readings from Last 1 Encounters:  08/13/14 223 lb 15.8 oz (101.6 kg)    Re-estimated needs:  Kcal: 2300-2500  Protein: 105-120  Fluid: 2300 ml daily   Skin: +1 RLE edema, +2 LLE edema  Diet Order: Clear Liquid   Intake/Output Summary (Last 24 hours) at 08/17/14 1243 Last data filed at 08/17/14 1015  Gross per 24 hour  Intake 2545.45 ml  Output   1075 ml  Net 1470.45 ml    Last BM: 9/29   Labs:   Recent Labs Lab 08/14/14 0515 08/16/14 1415 08/17/14 0535  NA 141 135* 135*  K 3.8 4.2 3.9  CL 99 99 99  CO2 25 23 25   BUN 12 8 7   CREATININE 0.88 0.87 0.91  CALCIUM 8.8 8.2* 8.9  MG  --  1.5 1.5  PHOS  --  1.9* 2.1*  GLUCOSE 119* 96 169*    CBG (last 3)   Recent Labs  08/16/14 1805 08/16/14 2350 08/17/14 0534  GLUCAP 176* 139* 172*    Scheduled Meds: . enoxaparin (LOVENOX) injection  40 mg Subcutaneous Q24H  . feeding supplement (RESOURCE BREEZE)  1 Container Oral BID BM  . fluconazole (DIFLUCAN) IV  100 mg Intravenous Q24H  . insulin aspart  0-24 Units Subcutaneous 4 times per day  . magnesium sulfate 1 - 4 g bolus IVPB  2 g Intravenous Once  . [START ON 08/19/2014] pantoprazole (PROTONIX) IV  40 mg Intravenous Q12H    Continuous Infusions: . sodium chloride 50 mL/hr at 08/17/14 1215  . sodium chloride    . Marland KitchenTPN (CLINIMIX-E) Adult 40 mL/hr at 08/17/14 0525   And  . fat emulsion 250 mL (08/16/14 1809)  . Marland KitchenTPN (CLINIMIX-E) Adult     And  . fat emulsion    . pantoprozole (PROTONIX) infusion 8 mg/hr (08/17/14 1208)    Antonieta Iba, RD, LDN Clinical Inpatient Dietitian Pager:  [phone] Weekend and after hours pager:  [phone]

## 2014-08-17 NOTE — Progress Notes (Signed)
Patient ID: Evan [name], male   DOB: 06/07/1941, 73 y.o.   MRN: [id number] [facility] Progress Note  Evan [name] 73 y.o. Jan 14, 1941   Subjective: Feels ok. Resting in bed. Denies abdominal pain/N/V. Tolerating clear liquids.  Objective: Vital signs: Filed Vitals:   08/17/14 1405  BP: 152/68  Pulse: 92  Temp: 98.5 F (36.9 C)  Resp: 16    Physical Exam: Gen: alert, no acute distress  Abd: epigastric tenderness with minimal guarding, soft, nondistended, +BS  Lab Results:  Recent Labs  08/16/14 1415 08/17/14 0535  NA 135* 135*  K 4.2 3.9  CL 99 99  CO2 23 25  GLUCOSE 96 169*  BUN 8 7  CREATININE 0.87 0.91  CALCIUM 8.2* 8.9  MG 1.5 1.5  PHOS 1.9* 2.1*    Recent Labs  08/16/14 1415 08/17/14 0535  AST 21 19  ALT 14 14  ALKPHOS 209* 233*  BILITOT 0.5 0.5  PROT 5.3* 5.7*  ALBUMIN 2.4* 2.5*    Recent Labs  08/16/14 1415 08/17/14 0535  WBC 7.7 7.1  NEUTROABS  --  5.0  HGB 8.8* 9.4*  HCT 26.3* 28.4*  MCV 101.5* 101.8*  PLT 369 389      Assessment/Plan: Large gastric ulcer and duodenal ulcer with benign gastric biopsies of antrum in the setting of recent chemoradiation for pancreatic cancer - continue TNA, Protonix infusion, clear liquids. Hgb stable. Will follow.   Marissa C. 08/17/2014, 5:19 PM

## 2014-08-17 NOTE — Care Management Note (Signed)
CARE MANAGEMENT NOTE 08/17/2014  Patient:  CEASER, EBELING   Account Number:  [id number]  Date Initiated:  08/17/2014  Documentation initiated by:  Marney Doctor  Subjective/Objective Assessment:   73 yo admitted with nausea and vomiting.  Hx of pancreatic ca.     Action/Plan:   From home with wife and [facility]   Anticipated DC Date:  08/21/2014   Anticipated DC Plan:  [place]  CM consult      Choice offered to / List presented to:             Status of service:  In process, will continue to follow Medicare Important Message given?  YES (If response is "NO", the following Medicare IM given date fields will be blank) Date Medicare IM given:  08/17/2014 Medicare IM given by:  Marney Doctor Date Additional Medicare IM given:   Additional Medicare IM given by:    Discharge Disposition:    Per UR Regulation:  Reviewed for med. necessity/level of care/duration of stay  If discussed at [place] of Stay Meetings, dates discussed:    Comments:  08/17/14 Marney Doctor RN,BSN,NCM Pt admitted from home with wife. He began TPN and lipids on 08/16/14.  He had [place] services through [facility] prior to admission.  Hospital rep is following while in hospital and will need resumption orders at DC.  CM will follow for needs.

## 2014-08-17 NOTE — Progress Notes (Signed)
PARENTERAL NUTRITION CONSULT NOTE - Follow up  Pharmacy Consult for TNA Indication: intolerance to enteral feeding due to massive antral ulcer  Allergies  Allergen Reactions  . Ace Inhibitors   . Ciprofloxacin Rash    Patient Measurements: Height: _0  (188 cm) Weight: 223 lb 15.8 oz (101.6 kg) IBW/kg (Calculated) : 82.2  Vital Signs: Temp: 98.6 F (37 C) (10/02 0541) Temp Source: Oral (10/02 0541) BP: 140/62 mmHg (10/02 0541) Pulse Rate: 73 (10/02 0541) Intake/Output from previous day: 10/01 0701 - 10/02 0700 In: 2305.5 [I.V.:1750.8; TPN:554.7] Out: 600 [Urine:600] Intake/Output from this shift: Total I/O In: -  Out: 475 [Urine:475]  Labs:  Recent Labs  08/16/14 1415 08/17/14 0535  WBC 7.7 7.1  HGB 8.8* 9.4*  HCT 26.3* 28.4*  PLT 369 389     Recent Labs  08/16/14 1415 08/17/14 0531 08/17/14 0535  NA 135*  --  135*  K 4.2  --  3.9  CL 99  --  99  CO2 23  --  25  GLUCOSE 96  --  169*  BUN 8  --  7  CREATININE 0.87  --  0.91  CALCIUM 8.2*  --  8.9  MG 1.5  --  1.5  PHOS 1.9*  --  2.1*  PROT 5.3*  --  5.7*  ALBUMIN 2.4*  --  2.5*  AST 21  --  19  ALT 14  --  14  ALKPHOS 209*  --  233*  BILITOT 0.5  --  0.5  TRIG  --  172*  --    Estimated Creatinine Clearance: 94.8 ml/min (by C-G formula based on Cr of 0.91).    Recent Labs  08/16/14 1805 08/16/14 2350 08/17/14 0534  GLUCAP 176* 139* 172*    Medical History: Past Medical History  Diagnosis Date  . Chronic back pain greater than 3 months duration   . Bulging discs   . Diabetes mellitus     type II; neuropathy;   . Hypertension   . High cholesterol   . Leukocytosis   . GERD (gastroesophageal reflux disease)   . Pancreatitis 2004  . Anxiety   . DDD (degenerative disc disease) 2005    lumbar spine  . History of smoking 08/01/2012  . Weight loss 08/01/2012  . Cough 08/01/2012  . 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 Left leg pain 08/05/2012  . 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 Chronic pancreatitis   . Pancreatic cancer 06/08/2014  . OSA (obstructive sleep apnea) 2010    does not use CPAP  . Nausea alone 06/13/2014  . Allergy   . Mucositis (ulcerative) due to antineoplastic therapy 07/02/2014  . Constipated 07/16/2014    Medications:  Scheduled:  . enoxaparin (LOVENOX) injection  40 mg Subcutaneous Q24H  . feeding supplement (RESOURCE BREEZE)  1 Container Oral BID BM  . fluconazole (DIFLUCAN) IV  100 mg Intravenous Q24H  . insulin aspart  0-24 Units Subcutaneous 4 times per day  . [START ON 08/19/2014] pantoprazole (PROTONIX) IV  40 mg Intravenous Q12H   Infusions:  . sodium chloride    . Marland KitchenTPN (CLINIMIX-E) Adult 40 mL/hr at 08/17/14 0525   And  . fat emulsion 250 mL (08/16/14 1809)  . 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 Biopsies from the antrum were NEGATIVE for cancer. Due to prolonged inability to tolerate adequate enteral nutrition, while this gastric ulcer heals, Pharmacy has been consulted to start TNA. Noted history of CKD and DM2.   Significant events:  10/2: no free air on abd Xray, patient will be given a trial of clear liquids  Today: 10/2  Glucose: 139-176 Electrolytes: Na sl low at 135 (can not adjust in pre-made TNA), Phos low at 2.1, Mag on low end at 1.5, K 3.9, CCa 10.1 Renal: SCr 0.9- stable, CrCl 90 ml/min  LFTs: Alk Phos increased to 233, alb low at 2.65 Tbili WNL, LFTs WNL TGs: 172 (10/2) Prealbumin: ordered   Nutritional Goals:  Kcal: 2300-2500  Protein: 105-120  Fluid: 2300  ml daily  Clinimix E 5/20 at a goal rate of 90 ml/hr with 20% fat emulsion at 10 ml/hr will provide 2380 kcal/d, 108 gm protein/d, 2160 ml/d   Plan:  At 1800 today:  Increase Clinimix E 5/20 to 60 ml/hr.  Continue 20% fat emulsion at 42m/hr.  Plan to advance as tolerated to the goal rate. Watch for refeeding syndrome  TNA to contain standard multivitamins and trace elements.  Change IVF to NS @ 35 ml/hr  Continue custom resistant scale SSI and CBG checks q6h Magnesium sulfate 2g IV x1  Will not replace phosphorus at this time as it is trending up now that patient is receiving some electrolytes from TNA Bmet, Mag, Phos in AM TNA lab panels on Mondays & Thursdays.  F/u daily.  Thank you for the consult.  JCurrie Paris PharmD, BCPS Pager: [phone]: [phone]/12/2013 9:38 AM

## 2014-08-17 NOTE — Progress Notes (Signed)
Evan [name]   DOB:05-Jul-1941   CZ#:[id number]   FUX#:[id number]  I have seen the patient, examined him and edited the notes as follows  Subjective: He reports no nausea, emesis or diarrhea. Abdominal pain improved from prior day. He had bowel movement yesterday. He is now on clear liquids and  total parenteral nutrition since 10/1, tolerating it well. Denies cardiac or respiratory complaints. Follows commands, no confusion noted. Ambulates around room.   Scheduled Meds: . enoxaparin (LOVENOX) injection  40 mg Subcutaneous Q24H  . feeding supplement (RESOURCE BREEZE)  1 Container Oral BID BM  . fluconazole (DIFLUCAN) IV  100 mg Intravenous Q24H  . insulin aspart  0-24 Units Subcutaneous 4 times per day  . [START ON 08/19/2014] pantoprazole (PROTONIX) IV  40 mg Intravenous Q12H   Continuous Infusions: . sodium chloride    . Marland KitchenTPN (CLINIMIX-E) Adult 40 mL/hr at 08/17/14 0525   And  . fat emulsion 250 mL (08/16/14 1809)  . pantoprozole (PROTONIX) infusion 8 mg/hr (08/17/14 0023)   PRN Meds:acetaminophen, HYDROmorphone (DILAUDID) injection, HYDROmorphone, ondansetron (ZOFRAN) IVPB ([facility])   Objective:  Filed Vitals:   08/17/14 0541  BP: 140/62  Pulse: 73  Temp: 98.6 F (37 C)  Resp: 18      Intake/Output Summary (Last 24 hours) at 08/17/14 0733 Last data filed at 08/17/14 0542  Gross per 24 hour  Intake 2305.45 ml  Output    600 ml  Net 1705.45 ml    ECOG PERFORMANCE STATUS: 2-3  GENERAL:alert, no distress and uncomfortable due to nausea, and pain. Ill appearing. SKIN: skin color, texture, turgor are normal, no rashes or significant lesions EYES: normal, conjunctiva are pink and non-injected, sclera clear OROPHARYNX:no exudate, no erythema and lips, buccal mucosa, and tongue normal. Oral mucosa moist NECK: supple, thyroid normal size, non-tender, without nodularity LYMPH:  no palpable lymphadenopathy in the cervical, axillary or inguinal LUNGS: clear to auscultation and  percussion with normal breathing effort HEART: regular rate & rhythm and no murmurs and trace of lower extremity edema ABDOMEN :abdomen soft, non distended, slightly tender at the epigastric area, bowel sounds present.  Musculoskeletal:no cyanosis of digits and no clubbing  PSYCH: alert & oriented x 3 with fluent speech NEURO: no focal motor/sensory deficits    CBG (last 3)   Recent Labs  08/16/14 1805 08/16/14 2350 08/17/14 0534  GLUCAP 176* 139* 172*     Labs:   Recent Labs Lab 08/13/14 1344 08/16/14 1415 08/17/14 0535  WBC 13.3* 7.7 7.1  HGB 10.5* 8.8* 9.4*  HCT 31.6* 26.3* 28.4*  PLT 376 369 389  MCV 100.9* 101.5* 101.8*  MCH 33.6* 34.0 33.7  MCHC 33.3 33.5 33.1  RDW 21.4* 20.6* 20.2*  LYMPHSABS 0.7*  --  0.9  MONOABS 1.3*  --  1.0  EOSABS 0.0  --  0.2  BASOSABS 0.1  --  0.1     Chemistries:    Recent Labs Lab 08/13/14 1344 08/14/14 0515 08/16/14 1415 08/17/14 0535  NA 140 141 135* 135*  K 3.3* 3.8 4.2 3.9  CL  --  99 99 99  CO2 25 25 23 25   GLUCOSE 110 119* 96 169*  BUN 9.1 12 8 7   CREATININE 0.9 0.88 0.87 0.91  CALCIUM 9.7 8.8 8.2* 8.9  MG  --   --  1.5 1.5  AST 35* 25 21 19   ALT 24 19 14 14   ALKPHOS 262* 235* 209* 233*  BILITOT 1.06 0.8 0.5 0.5    GFR Estimated  Creatinine Clearance: 94.8 ml/min (by C-G formula based on Cr of 0.91).  Liver Function Tests:  Recent Labs Lab 08/13/14 1344 08/14/14 0515 08/16/14 1415 08/17/14 0535  AST 35* 25 21 19   ALT 24 19 14 14   ALKPHOS 262* 235* 209* 233*  BILITOT 1.06 0.8 0.5 0.5  PROT 6.5 5.9* 5.3* 5.7*  ALBUMIN 2.8* 2.6* 2.4* 2.5*  CBG:  Recent Labs Lab 08/16/14 0712 08/16/14 1228 08/16/14 1805 08/16/14 2350 08/17/14 0534  GLUCAP 76 92 176* 139* 172*     Imaging Studies:  Ct Abdomen Pelvis W Contrast  08/15/2014   CLINICAL DATA:  CML, on going oral chemotherapy. Pancreatic cancer. Decreased appetite. Right abdominal pain, nausea.  EXAM: CT ABDOMEN AND PELVIS WITH CONTRAST   TECHNIQUE: Multidetector CT imaging of the abdomen and pelvis was performed using the standard protocol following bolus administration of intravenous contrast.  CONTRAST:  142mL OMNIPAQUE IOHEXOL 300 MG/ML  SOLN  COMPARISON:  CT 09/09/2010.  MRI 06/05/2014.  CT chest 06/06/2014.  FINDINGS: Small bilateral pleural effusions with compressive atelectasis in the lower lobes, new since prior chest CT. Heart is normal size. Diffuse coronary artery calcifications noted.  Metallic biliary stent is in place with decreasing biliary dilatation. Pneumobilia noted. Cystic area within the pancreatic body measures 1.9 cm. Previously seen pancreatic head mass on MRI difficult to visualize and measure by CT.  There is diffuse gastric wall thickening, most pronounced in the distal stomach. This is compatible with gastritis and may be related to postradiation changes if the patient has received radiation in this area. Spleen, adrenals are unremarkable. No focal hepatic abnormality. Small cysts in the right kidney. Calcifications in the hila bilaterally compatible with renovascular calcifications. Aorta is calcified, non aneurysmal.  Visualized large and small bowel are unremarkable.  IMPRESSION: Interval placement of metallic biliary stent with decreasing biliary duct dilatation. New pneumobilia.  Cystic area within the pancreatic body is stable. The solid pancreatic head mass seen on prior MRI cannot be visualized by CT. No visible local extension. Portal vein is patent.  Diffuse gastric wall thickening, most pronounced in the distal stomach, question radiation gastritis.  Small bilateral pleural effusions with compressive atelectasis in the lower lobes.   Electronically Signed   By: Rolm Baptise M.D.   On: 08/15/2014 09:27   Dg Abd 2 Views  08/16/2014   CLINICAL DATA:  73 year old male with acute onset abdominal pain in all 4 quadrants. Personal history of large distal gastric ulcer and pancreatic cancer. Initial encounter.   EXAM: ABDOMEN - 2 VIEW  COMPARISON:  CT Abdomen and Pelvis 08/15/2014.  FINDINGS: Upright and supine views. No pneumoperitoneum. Small bilateral pleural effusions.  Cholecystectomy clips. Metal CBD stent. Non obstructed bowel gas pattern. Stable visualized osseous structures.  IMPRESSION: No pneumoperitoneum.  Non obstructed bowel gas pattern.   Electronically Signed   By: Lars Pinks M.D.   On: 08/16/2014 15:35    Assessment/Plan: 73 y.o.  #1 uncontrolled nausea, vomiting and dehydration, improving On admission, he was placed on IV fluids at 125 cc/hr along with round the clock intravenous anti-emetics.  Recent abdominal x-ray did not show evidence of bowel obstruction. GI consultation was obtained on 9/29. CT scan of the abdomen and pelvis on 9/30 showed gastritis with no signs of disease progression. EGD on 9/30 by GI (Dr. Michail Sermon) showed a massive antral ulcer, possibly causing functional obstruction for the pylorus, likely induced by radiation. A duodenal bulb ulcer with erosive and candida esophagitis were noted. Biopsies were taken,  with results negative for malignancy. He was  n.p.o with IV fluids and antiemetics, IV Protonix and IV Diflucan On 10/1 round the clock antiemetics were discontinued and changed to as needed basis since his nausea has resolved. As of 10/2, symptoms improved with no further emesis, diarrhea or dry heaves.   #2 leukocytosis with esophageal candidiasis Patient is started on intravenous fluconazole on 10/1 leukocytosis, likely reactive, resolved  #3 abdominal distention, pain and diarrhea.  Stool studies to exclude infection were performed on admission. C difficile negative.  Abdominal x-ray on 9/28 was negative for bowel obstruction.  Given pain medicine as needed intravenously, as prescribed. Patient's diarrhea subsided as of 9/30, no Imodium is indicated at this time abdominal x ray on 10/1 was negative for pneumoperitoneum or obstruction  #4 Gastric  ulcer The patient is placed on high-dose Protonix. Appreciate GI involvement.   #5 anemia  Persistent anemia of neoplastic disease. He is not symptomatic.  Would transfuse if hemoglobin drops to less than 8 g.   #6 pancreatic cancer  CA 19-9 was drawn on 9/29 at 259.4 (was 2197.1 in August 08/2014), thus, significantly reduced. CT scan showed no disease progression. Continue supportive care.  #7 DVT prophylaxis  On Lovenox   #8 hypokalemia  This is due to diarrhea.  Potasium levels have been normalized on 9/28  at 3.8 after  IV replenishment   #9 protein calorie malnutrition  He was started on liquid diet, changed to NPO for procedures.  Nutritional consult was obtained on 9/29 recommending Resource Breeze po BID,bland, easy to tolerate foods. Follow up appreciated He was placed on NPO until 10/1 with continued IV fluid resuscitation, then switched to clear liquid diet and total parenteral nutrition (TPN) by Pharmacy, per Gi recommendation. IV fluids are now at 50 cc/hr.  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 . IV fluids has been changed to dextrose infusion  #11 severe constipation, resolved Round-the-clock Zofran has been discontinued. abdominal x ray on 10/1 was negative for pneumoperitoneum or obstruction. Last bowel movement on 10/1. Monitor carefully.  #12 discharge planning  The patient is readmitted to due to recent premature dismissal last week.  Goals of care is discussed with the patient. Will discharge him once he is able to tolerate oral intake without nausea or vomiting.   #13 Code status  Full code  Evan [name] 08/17/2014  7:33 AM Shail Urbas, MD 08/17/2014

## 2014-08-17 NOTE — Evaluation (Signed)
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  Clinical Impression  Pt currently with functional limitations due to the deficits listed below (see PT Problem List).  Pt will benefit from skilled PT to increase their independence and safety with mobility to allow discharge to the venue listed below.  Discussed PT plan of care with spouse and pt, and spouse would like PT to continue to work with pt in hospital until she feels more comfortable ambulating pt and going through exercises.  Spouse reports pt needs to be strong for whipple procedure at the beginning of Nov.     Follow Up Recommendations Home health PT    Equipment Recommendations  None recommended by PT    Recommendations for Other Services       Precautions / Restrictions Precautions Precautions: None      Mobility  Bed Mobility Overal bed mobility: Modified Independent             General bed mobility comments: HOB elevated, used rail  Transfers Overall transfer level: Needs assistance Equipment used: None Transfers: Sit to/from Stand Sit to Stand: Min guard         General transfer comment: min/guard for safety  Ambulation/Gait Ambulation/Gait assistance: Min guard Ambulation Distance (Feet): 400 Feet Assistive device: None Gait Pattern/deviations: Step-through pattern     General Gait Details: pt pushed IV pole for support first 200 feet then allowed PT to push RW and ambulated without assistive device, occasional increase in gait speed with head tilted forward however able self correct  Stairs            Wheelchair Mobility    Modified Rankin (Stroke Patients Only)       Balance                              High level balance activites: Backward walking;Head turns;Sudden stops;Turns High Level Balance Comments: no LOB with above activities during gait             Pertinent Vitals/Pain Pain Assessment: No/denies pain    Home Living Family/patient expects to be discharged to:: Private residence Living Arrangements: Spouse/significant other Available Help at Discharge: Family Type of Home: House Home Access: Stairs to enter Entrance Stairs-Rails: Left;Right;Can reach both Technical brewer of Steps: 8 Home Layout: One level Home Equipment: Cane - single point;Walker - 2 wheels      Prior Function Level of Independence: Independent               Hand Dominance        Extremity/Trunk Assessment               Lower Extremity Assessment: Generalized weakness         Communication   Communication: No difficulties  Cognition Arousal/Alertness: Awake/alert Behavior During Therapy: WFL for tasks assessed/performed Overall Cognitive Status: Within Functional Limits for tasks assessed                      General Comments      Exercises        Assessment/Plan    PT Assessment Patient needs continued PT services  PT Diagnosis Difficulty walking   PT Problem List Decreased  activity tolerance;Decreased balance;Decreased mobility;Decreased strength  PT Treatment Interventions DME instruction;Gait training;Therapeutic activities;Therapeutic exercise;Functional mobility training;Stair training;Patient/family education   PT Goals (Current goals can be found in the Care Plan section) Acute Rehab PT Goals Patient Stated Goal: get stronger for upcoming whipple surgery PT Goal Formulation: With patient/family Time For Goal Achievement: 08/24/14 Potential to Achieve Goals: Good    Frequency Min 3X/week   Barriers to discharge        Co-evaluation               End of Session Equipment Utilized During  Treatment: Gait belt Activity Tolerance: Patient tolerated treatment well Patient left: in chair;with call bell/phone within reach;with family/visitor present           Time: 7782-4235 PT Time Calculation (min): 16 min   Charges:   PT Evaluation $Initial PT Evaluation Tier I: 1 Procedure PT Treatments $Gait Training: 8-22 mins   PT G Codes:          Khyler Urda,KATHrine E 08/17/2014, 2:35 PM Carmelia Bake, PT, DPT 08/17/2014 Pager: [phone]

## 2014-08-18 DIAGNOSIS — C9210 Chronic myeloid leukemia, BCR/ABL-positive, not having achieved remission: Secondary | ICD-10-CM

## 2014-08-18 DIAGNOSIS — E119 Type 2 diabetes mellitus without complications: Secondary | ICD-10-CM

## 2014-08-18 DIAGNOSIS — D649 Anemia, unspecified: Secondary | ICD-10-CM

## 2014-08-18 LAB — BASIC METABOLIC PANEL
Anion gap: 10 (ref 5–15)
BUN: 6 mg/dL (ref 6–23)
CO2: 25 meq/L (ref 19–32)
CREATININE: 0.86 mg/dL (ref 0.50–1.35)
Calcium: 9.1 mg/dL (ref 8.4–10.5)
Chloride: 102 mEq/L (ref 96–112)
GFR calc Af Amer: >90 mL/min (ref >90)
GFR calc non Af Amer: 85 mL/min — ABNORMAL LOW (ref >90)
GLUCOSE: 214 mg/dL — AB (ref 70–99)
Potassium: 3.5 mEq/L — ABNORMAL LOW (ref 3.7–5.3)
Sodium: 137 mEq/L (ref 137–147)

## 2014-08-18 LAB — GLUCOSE, CAPILLARY
GLUCOSE-CAPILLARY: 203 mg/dL — AB (ref 70–99)
GLUCOSE-CAPILLARY: 203 mg/dL — AB (ref 70–99)
GLUCOSE-CAPILLARY: 312 mg/dL — AB (ref 70–99)
Glucose-Capillary: 228 mg/dL — ABNORMAL HIGH (ref 70–99)

## 2014-08-18 LAB — PHOSPHORUS: Phosphorus: 2.8 mg/dL (ref 2.3–4.6)

## 2014-08-18 LAB — MAGNESIUM: Magnesium: 1.6 mg/dL (ref 1.5–2.5)

## 2014-08-18 LAB — PREALBUMIN: PREALBUMIN: 10.3 mg/dL — AB (ref 17.0–34.0)

## 2014-08-18 MED ORDER — INSULIN GLARGINE 100 UNIT/ML ~~LOC~~ SOLN
10.0000 [IU] | Freq: Two times a day (BID) | SUBCUTANEOUS | Status: DC
  Administered 2014-08-18 – 2014-08-23 (×11): 10 [IU] via SUBCUTANEOUS
  Filled 2014-08-18 (×12): qty 0.1

## 2014-08-18 MED ORDER — TRACE MINERALS CR-CU-F-FE-I-MN-MO-SE-ZN IV SOLN
INTRAVENOUS | Status: AC
  Administered 2014-08-18: 18:00:00 via INTRAVENOUS
  Filled 2014-08-18: qty 2000

## 2014-08-18 MED ORDER — SODIUM CHLORIDE 0.9 % IV SOLN
8.0000 mg/h | INTRAVENOUS | Status: AC
  Administered 2014-08-18 – 2014-08-21 (×5): 8 mg/h via INTRAVENOUS
  Filled 2014-08-18 (×15): qty 80

## 2014-08-18 MED ORDER — FAT EMULSION 20 % IV EMUL
250.0000 mL | INTRAVENOUS | Status: AC
  Administered 2014-08-18: 250 mL via INTRAVENOUS
  Filled 2014-08-18: qty 250

## 2014-08-18 MED ORDER — PANTOPRAZOLE SODIUM 40 MG IV SOLR: 40.0000 mg | Freq: Two times a day (BID) | INTRAVENOUS | Status: DC

## 2014-08-18 MED ORDER — POTASSIUM CHLORIDE 10 MEQ/100ML IV SOLN
10.0000 meq | INTRAVENOUS | Status: AC
Start: 2014-08-18 — End: 2014-08-18
  Administered 2014-08-18 (×2): 10 meq via INTRAVENOUS
  Filled 2014-08-18 (×2): qty 100

## 2014-08-18 MED ORDER — SODIUM CHLORIDE 0.9 % IV SOLN: INTRAVENOUS | Status: DC

## 2014-08-18 NOTE — Progress Notes (Signed)
Patient ID: Evan [name], male   DOB: 1941-02-28, 73 y.o.   MRN: [id number] [facility] Gastroenterology Progress Note  Evan [name] 73 y.o. 15-Aug-1941   Subjective: Feels ok. No complaints. Tolerating clear liquids. Denies abdominal pain/N/V. Wife in room.  Objective: Vital signs in last 24 hours: Filed Vitals:   08/18/14 1345  BP: 146/77  Pulse: 73  Temp: 97.6 F (36.4 C)  Resp: 18    Physical Exam: Gen: alert, no acute distress Abd: soft, nontender, nondistended, +BS  Lab Results:  Recent Labs  08/17/14 0535 08/18/14 0531  NA 135* 137  K 3.9 3.5*  CL 99 102  CO2 25 25  GLUCOSE 169* 214*  BUN 7 6  CREATININE 0.91 0.86  CALCIUM 8.9 9.1  MG 1.5 1.6  PHOS 2.1* 2.8    Recent Labs  08/16/14 1415 08/17/14 0535  AST 21 19  ALT 14 14  ALKPHOS 209* 233*  BILITOT 0.5 0.5  PROT 5.3* 5.7*  ALBUMIN 2.4* 2.5*    Recent Labs  08/16/14 1415 08/17/14 0535  WBC 7.7 7.1  NEUTROABS  --  5.0  HGB 8.8* 9.4*  HCT 26.3* 28.4*  MCV 101.5* 101.8*  PLT 369 389   No results found for this basename: LABPROT, INR,  in the last 72 hours    Assessment/Plan: Large gastric ulcer and duodenal ulcer in the setting of recent chemoradiation for pancreatic cancer - continue TNA, continue Protonix infusion, clear liquids. May start slowly advancing his diet on Monday. Recheck CBC tomorrow. Will follow.       [name] 08/18/2014, 4:50 PM

## 2014-08-18 NOTE — Progress Notes (Signed)
IP PROGRESS NOTE  Subjective:   No complaint today. No pain or nausea.  Objective: Vital signs in last 24 hours: Blood pressure 140/75, pulse 73, temperature 98.2 F (36.8 C), temperature source Oral, resp. rate 16, height 6\' 2"  (1.88 m), weight 223 lb 15.8 oz (101.6 kg), SpO2 96.00%.  Intake/Output from previous day: 10/02 0701 - 10/03 0700 In: 800 [P.O.:800] Out: 1800 [Urine:1800]  Physical Exam:  HEENT: No thrush Lungs: Decreased breath sounds at the bases Cardiac: Regular rate and rhythm Abdomen: Soft and nontender Extremities: No leg edema   Portacath/PICC-without erythema  Lab Results:  Recent Labs  2014/09/15 1415 08/17/14 0535  WBC 7.7 7.1  HGB 8.8* 9.4*  HCT 26.3* 28.4*  PLT 369 389    BMET  Recent Labs  08/17/14 0535 08/18/14 0531  NA 135* 137  K 3.9 3.5*  CL 99 102  CO2 25 25  GLUCOSE 169* 214*  BUN 7 6  CREATININE 0.91 0.86  CALCIUM 8.9 9.1    Studies/Results: Dg Abd 2 Views  09/15/14   CLINICAL DATA:  73 year old male with acute onset abdominal pain in all 4 quadrants. Personal history of large distal gastric ulcer and pancreatic cancer. Initial encounter.  EXAM: ABDOMEN - 2 VIEW  COMPARISON:  CT Abdomen and Pelvis 08/15/2014.  FINDINGS: Upright and supine views. No pneumoperitoneum. Small bilateral pleural effusions.  Cholecystectomy clips. Metal CBD stent. Non obstructed bowel gas pattern. Stable visualized osseous structures.  IMPRESSION: No pneumoperitoneum.  Non obstructed bowel gas pattern.   Electronically Signed   By: Lars Pinks M.D.   On: September 15, 2014 15:35    Medications: I have reviewed the patient's current medications.  Assessment/Plan:  1. Pancreas cancer-locally advanced, status post chemotherapy and radiation-last treated with gemcitabine 08/01/2014  2. admission with nausea and vomiting-EGD 08/15/2014 revealed a large antral ulcer and a duodenal bulb ulcer and Candida esophagitis  3. CML-Spry[employer] on hold  4. anemia   5.  malnutrition-on TNA  6. Diabetes  He appears well. The plan is to continue TNA and antiacid therapy for management of the gastric/duodenal ulcers.   LOS: 5 days   Evan [name]  08/18/2014, 9:41 AM

## 2014-08-18 NOTE — Progress Notes (Signed)
PARENTERAL NUTRITION CONSULT NOTE - Follow up  Pharmacy Consult for TNA Indication: intolerance to enteral feeding due to massive antral ulcer  Allergies  Allergen Reactions  . Ace Inhibitors   . Ciprofloxacin Rash    Patient Measurements: Height: _0  (188 cm) Weight: 223 lb 15.8 oz (101.6 kg) IBW/kg (Calculated) : 82.2  Vital Signs: Temp: 98.2 F (36.8 C) (10/03 0528) Temp Source: Oral (10/03 0528) BP: 140/75 mmHg (10/03 0528) Pulse Rate: 73 (10/03 0528) Intake/Output from previous day: 10/02 0701 - 10/03 0700 In: 800 [P.O.:800] Out: 2300 [Urine:2300] Intake/Output from this shift: Total I/O In: 640 [P.O.:640] Out: 750 [Urine:750]  Labs:  Recent Labs  08/16/14 1415 08/17/14 0535  WBC 7.7 7.1  HGB 8.8* 9.4*  HCT 26.3* 28.4*  PLT 369 389     Recent Labs  08/16/14 1415 08/17/14 0531 08/17/14 0535 08/18/14 0531  NA 135*  --  135* 137  K 4.2  --  3.9 3.5*  CL 99  --  99 102  CO2 23  --  25 25  GLUCOSE 96  --  169* 214*  BUN 8  --  7 6  CREATININE 0.87  --  0.91 0.86  CALCIUM 8.2*  --  8.9 9.1  MG 1.5  --  1.5 1.6  PHOS 1.9*  --  2.1* 2.8  PROT 5.3*  --  5.7*  --   ALBUMIN 2.4*  --  2.5*  --   AST 21  --  19  --   ALT 14  --  14  --   ALKPHOS 209*  --  233*  --   BILITOT 0.5  --  0.5  --   PREALBUMIN  --   --  9.1*  --   TRIG  --  172*  --   --    Estimated Creatinine Clearance: 100.3 ml/min (by C-G formula based on Cr of 0.86).    Recent Labs  08/18/14 08/18/14 0530 08/18/14 1135  GLUCAP 249* 203* 228*    Medical History: Past Medical History  Diagnosis Date  . Chronic back pain greater than 3 months duration   . Bulging discs   . Diabetes mellitus     type II; neuropathy;   . Hypertension   . High cholesterol   . Leukocytosis   . GERD (gastroesophageal reflux disease)   . Pancreatitis 2004  . Anxiety   . DDD (degenerative disc disease) 2005    lumbar spine  . History of smoking 08/01/2012  . Weight loss 08/01/2012  . Cough  08/01/2012  . 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 Left leg pain 08/05/2012  . 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 Chronic pancreatitis   . Pancreatic cancer 06/08/2014  . OSA (obstructive sleep apnea) 2010    does not use CPAP  . Nausea alone 06/13/2014  . Allergy   . Mucositis (ulcerative) due to antineoplastic therapy 07/02/2014  . Constipated 07/16/2014    Medications:  Scheduled:  . enoxaparin (LOVENOX) injection  40 mg Subcutaneous Q24H  . feeding supplement (RESOURCE BREEZE)  1 Container Oral BID BM  . fluconazole (DIFLUCAN) IV  100 mg Intravenous Q24H  . insulin aspart  0-24 Units Subcutaneous 4 times per day  . [START ON 08/19/2014] pantoprazole (PROTONIX) IV  40 mg Intravenous Q12H  . potassium chloride  10 mEq Intravenous Q1 Hr x 2   Infusions:  . sodium chloride 35 mL/hr at 08/17/14 1752  . Marland KitchenTPN (CLINIMIX-E) Adult 60 mL/hr at  08/17/14 1729   And  . fat emulsion 250 mL (08/17/14 1728)  . pantoprozole (PROTONIX) infusion 8 mg/hr (08/18/14 0804)    Insulin Requirements in the past 24 hours:  24 units custom resistant SSI used  Current Nutrition:  CLD- 55-100% intake Resource breeze BID- last charted   IVF: NS @ 35 ml/hr  ASSESSMENT  HPI: 73 yo obese man with pancreatic cancer, CML and recent chemoradiation who was found to have a massive cratered distal gastric ulcer on EGD yesterday and also had a 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 Biopsies from the antrum were NEGATIVE for cancer. Due to prolonged inability to tolerate adequate enteral nutrition, while this gastric ulcer heals, Pharmacy has been consulted to start TNA. Noted history of CKD and DM2.  Significant events:  10/2: no free air on abd Xray, patient will be given a trial of clear liquids  Today: 10/3  Glucose: 155-228 Electrolytes: K low, CCa 10.3, all other electrolytes WNL Renal: SCr 0.89- stable, CrCl 90 ml/min  LFTs: Alk Phos increased to 233, alb low at 2.65 Tbili WNL, LFTs  WNL (10/2) TGs: 172 (10/2) Prealbumin: 9.1 (10/2)  Nutritional Goals:  Kcal: 2300-2500  Protein: 105-120  Fluid: 2300 ml daily  Clinimix E 5/20 at a goal rate of 90 ml/hr with 20% fat emulsion at 10 ml/hr will provide 2380 kcal/d, 108 gm protein/d, 2160 ml/d  Plan:   Give KCL 90mq Q1H x 2 for total of 286m At 1800 today:  Increase Clinimix E 5/20 to 80 ml/hr.  Continue 20% fat emulsion at 1032mr.  Plan to advance as tolerated to the goal rate. Watch for refeeding syndrome  TNA to contain standard multivitamins and trace elements.  Change IVF to NS @ 15 ml/hr  Start Lantus 10 units BID Continue custom resistant scale SSI and CBG checks q6h Bmet AM TNA lab panels on Mondays & Thursdays.  F/u daily.  Thank you for the consult.  LauKizzie FurnishharmD Pager: [phone]/01/2014 12:41 PM

## 2014-08-19 DIAGNOSIS — C259 Malignant neoplasm of pancreas, unspecified: Secondary | ICD-10-CM

## 2014-08-19 DIAGNOSIS — Z856 Personal history of leukemia: Secondary | ICD-10-CM

## 2014-08-19 DIAGNOSIS — K269 Duodenal ulcer, unspecified as acute or chronic, without hemorrhage or perforation: Secondary | ICD-10-CM

## 2014-08-19 LAB — BASIC METABOLIC PANEL
ANION GAP: 13 (ref 5–15)
BUN: 9 mg/dL (ref 6–23)
CALCIUM: 9.4 mg/dL (ref 8.4–10.5)
CO2: 24 mEq/L (ref 19–32)
Chloride: 102 mEq/L (ref 96–112)
Creatinine, Ser: 0.84 mg/dL (ref 0.50–1.35)
GFR calc non Af Amer: 86 mL/min — ABNORMAL LOW (ref >90)
GLUCOSE: 228 mg/dL — AB (ref 70–99)
POTASSIUM: 3.7 meq/L (ref 3.7–5.3)
Sodium: 139 mEq/L (ref 137–147)

## 2014-08-19 LAB — GLUCOSE, CAPILLARY
GLUCOSE-CAPILLARY: 246 mg/dL — AB (ref 70–99)
Glucose-Capillary: 261 mg/dL — ABNORMAL HIGH (ref 70–99)
Glucose-Capillary: 259 mg/dL — ABNORMAL HIGH (ref 70–99)

## 2014-08-19 LAB — CBC
HCT: 30.1 % — ABNORMAL LOW (ref 39.0–52.0)
Hemoglobin: 10.1 g/dL — ABNORMAL LOW (ref 13.0–17.0)
MCH: 33.6 pg (ref 26.0–34.0)
MCHC: 33.6 g/dL (ref 30.0–36.0)
MCV: 100 fL (ref 78.0–100.0)
Platelets: 338 10*3/uL (ref 150–400)
RBC: 3.01 MIL/uL — AB (ref 4.22–5.81)
RDW: 20.1 % — ABNORMAL HIGH (ref 11.5–15.5)
WBC: 6.1 10*3/uL (ref 4.0–10.5)

## 2014-08-19 MED ORDER — TRACE MINERALS CR-CU-F-FE-I-MN-MO-SE-ZN IV SOLN
INTRAVENOUS | Status: AC
  Administered 2014-08-19: 18:00:00 via INTRAVENOUS
  Filled 2014-08-19: qty 2000

## 2014-08-19 MED ORDER — FAT EMULSION 20 % IV EMUL
250.0000 mL | INTRAVENOUS | Status: AC
  Administered 2014-08-19: 250 mL via INTRAVENOUS
  Filled 2014-08-19: qty 250

## 2014-08-19 NOTE — Progress Notes (Signed)
Subjective: The patient is seen and examined today. He was a little bit sleepy this morning but denied having any significant complaints except for generalized weakness. He has no fever or chills, no nausea or vomiting. He denied having any significant chest pain, shortness breath, cough or hemoptysis.  Objective: Vital signs in last 24 hours: Temp:  [97.6 F (36.4 C)-98.3 F (36.8 C)] 98.3 F (36.8 C) (10/04 0541) Pulse Rate:  [63-73] 63 (10/04 0541) Resp:  [16-18] 16 (10/04 0541) BP: (134-159)/(67-77) 159/70 mmHg (10/04 0541) SpO2:  [97 %-98 %] 98 % (10/04 0541)  Intake/Output from previous day: 10/03 0701 - 10/04 0700 In: 2810 [P.O.:2810] Out: 3575 [Urine:3575] Intake/Output this shift:    General appearance: alert, cooperative, fatigued and no distress Resp: clear to auscultation bilaterally Cardio: regular rate and rhythm, S1, S2 normal, no murmur, click, rub or gallop GI: soft, non-tender; bowel sounds normal; no masses,  no organomegaly Extremities: extremities normal, atraumatic, no cyanosis or edema  Lab Results:   Recent Labs  08/17/14 0535 08/19/14 0440  WBC 7.1 6.1  HGB 9.4* 10.1*  HCT 28.4* 30.1*  PLT 389 338   BMET  Recent Labs  08/18/14 0531 08/19/14 0440  NA 137 139  K 3.5* 3.7  CL 102 102  CO2 25 24  GLUCOSE 214* 228*  BUN 6 9  CREATININE 0.86 0.84  CALCIUM 9.1 9.4    Studies/Results: No results found.  Medications: I have reviewed the patient's current medications.  Assessment/Plan: 1) locally advanced pancreatic adenocarcinoma: Recently treated with concurrent chemoradiation with gemcitabine. 2) intractable nausea and vomiting on admission: Secondary to large antral ulcer and duodenal bulb ulcer. Significantly improved. He is followed by Dr. Michail Sermon. Continue Zofran when necessary. Continue TNA. 3) Candida esophagitis: Currently on Diflucan 100 mg by mouth daily. 4) pain management: Currently on Dilaudid when necessary. 5) history of  CML: Dasatinib is currently on hold.   LOS: 6 days    [place]amaia Brum K. 08/19/2014

## 2014-08-19 NOTE — Progress Notes (Signed)
Patient ID: Evan [name], male   DOB: Nov 12, 1941, 73 y.o.   MRN: [id number] [facility] Gastroenterology Progress Note  Evan [name] 73 y.o. [birth year]   Subjective: Feels ok. Tolerating clear liquids but wants to eat. Denies N/V/abdominal pain.  Objective: Vital signs: Filed Vitals:   08/19/14 0541  BP: 159/70  Pulse: 63  Temp: 98.3 F (36.8 C)  Resp: 16    Physical Exam: Gen: alert, no acute distress CV: RRR Chest: Clear anteriorly  Abd: soft, nontender, nondistended, +BS  Lab Results:  Recent Labs  08/17/14 0535 08/18/14 0531 08/19/14 0440  NA 135* 137 139  K 3.9 3.5* 3.7  CL 99 102 102  CO2 25 25 24   GLUCOSE 169* 214* 228*  BUN 7 6 9   CREATININE 0.91 0.86 0.84  CALCIUM 8.9 9.1 9.4  MG 1.5 1.6  --   PHOS 2.1* 2.8  --     Recent Labs  08/16/14 1415 08/17/14 0535  AST 21 19  ALT 14 14  ALKPHOS 209* 233*  BILITOT 0.5 0.5  PROT 5.3* 5.7*  ALBUMIN 2.4* 2.5*    Recent Labs  08/17/14 0535 08/19/14 0440  WBC 7.1 6.1  NEUTROABS 5.0  --   HGB 9.4* 10.1*  HCT 28.4* 30.1*  MCV 101.8* 100.0  PLT 389 338      Assessment/Plan: 73 yo large gastric ulcer and duodenal ulcer in the setting of recent chemoradiation for pancreatic cancer - Hgb stable. Continue TNA, continue Protonix infusion, clear liquids. Will place on full liquids tomorrow morning and then can slowly advance that day if tolerates full liquids. Will defer timing of advancing diet to tomorrow after he shows that he can tolerate full liquids without worsened pain or recurrence of nausea or vomiting.        [place] C. 08/19/2014, 10:57 AM

## 2014-08-19 NOTE — Progress Notes (Signed)
PARENTERAL NUTRITION CONSULT NOTE - Follow up  Pharmacy Consult for TNA Indication: intolerance to enteral feeding due to massive antral ulcer  Allergies  Allergen Reactions  . Ace Inhibitors   . Ciprofloxacin Rash   Patient Measurements: Height: _0  (188 cm) Weight: 223 lb 15.8 oz (101.6 kg) IBW/kg (Calculated) : 82.2  Vital Signs: Temp: 98.3 F (36.8 C) (10/04 0541) Temp Source: Oral (10/04 0541) BP: 159/70 mmHg (10/04 0541) Pulse Rate: 63 (10/04 0541) Intake/Output from previous day: 10/03 0701 - 10/04 0700 In: 2810 [P.O.:2810] Out: 3575 [Urine:3575] Intake/Output from this shift:    Labs:  Recent Labs  08/16/14 1415 08/17/14 0535 08/19/14 0440  WBC 7.7 7.1 6.1  HGB 8.8* 9.4* 10.1*  HCT 26.3* 28.4* 30.1*  PLT 369 389 338    Recent Labs  08/16/14 1415 08/17/14 0531 08/17/14 0535 08/18/14 0531 08/19/14 0440  NA 135*  --  135* 137 139  K 4.2  --  3.9 3.5* 3.7  CL 99  --  99 102 102  CO2 23  --  _1 GLUCOSE 96  --  169* 214* 228*  BUN 8  --  _2 CREATININE 0.87  --  0.91 0.86 0.84  CALCIUM 8.2*  --  8.9 9.1 9.4  MG 1.5  --  1.5 1.6  --   PHOS 1.9*  --  2.1* 2.8  --   PROT 5.3*  --  5.7*  --   --   ALBUMIN 2.4*  --  2.5*  --   --   AST 21  --  19  --   --   ALT 14  --  14  --   --   ALKPHOS 209*  --  233*  --   --   BILITOT 0.5  --  0.5  --   --   PREALBUMIN  --   --  9.1* 10.3*  --   TRIG  --  172*  --   --   --    Estimated Creatinine Clearance: 102.7 ml/min (by C-G formula based on Cr of 0.84).    Recent Labs  08/18/14 1737 08/18/14 2346 08/19/14 0538  GLUCAP 312* 203* 261*   Medical History: Past Medical History  Diagnosis Date  . Chronic back pain greater than 3 months duration   . Bulging discs   . Diabetes mellitus     type II; neuropathy;   . Hypertension   . High cholesterol   . Leukocytosis   . GERD (gastroesophageal reflux disease)   . Pancreatitis 2004  . Anxiety   . DDD (degenerative disc disease) 2005   lumbar spine  . History of smoking 08/01/2012  . Weight loss 08/01/2012  . Cough 08/01/2012  . 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 Left leg pain 08/05/2012  . 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 Chronic pancreatitis   . Pancreatic cancer 06/08/2014  . OSA (obstructive sleep apnea) 2010    does not use CPAP  . Nausea alone 06/13/2014  . Allergy   . Mucositis (ulcerative) due to antineoplastic therapy 07/02/2014  . Constipated 07/16/2014    Medications:  Scheduled:  . enoxaparin (LOVENOX) injection  40 mg Subcutaneous Q24H  . feeding supplement (RESOURCE BREEZE)  1 Container Oral BID BM  . fluconazole (DIFLUCAN) IV  100 mg Intravenous Q24H  . insulin aspart  0-24 Units Subcutaneous 4 times per day  . insulin glargine  10 Units Subcutaneous BID  Infusions:  . sodium chloride 15 mL/hr at 08/18/14 1800  . Marland KitchenTPN (CLINIMIX-E) Adult 80 mL/hr at 08/18/14 1800   And  . fat emulsion 250 mL (08/18/14 1800)  . pantoprozole (PROTONIX) infusion 8 mg/hr (08/19/14 0430)   Insulin Requirements in the past 24 hours:  42 units custom resistant SSI used, Lantus 10 units q12  Current Nutrition:  CLD- 55-100% intake Resource breeze BID- last charted   IVF: NS @ 15 ml/hr  ASSESSMENT  HPI: 73 yo obese man with pancreatic cancer, CML and recent chemoradiation who was found to have a massive cratered distal gastric ulcer on EGD and a 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 Biopsies from the antrum were NEGATIVE for cancer. Due to prolonged inability to tolerate adequate enteral nutrition, while this gastric ulcer heals, Pharmacy has been consulted to start TNA. Noted history of CKD and DM2.  Significant events:  10/2: no free air on abd Xray, patient will be given a trial of clear liquids 10/3: tolerating CL diet; increasing CBG, Lantus added 10/4: CBG's remain elevated with Clears, changing Clinimix to E 5/15  Today: 10/3  Glucose: serum glucose 228, CBG's Electrolytes: K  repleted, CCa 10.2, all other electrolytes WNL Renal: SCr 0.89- stable, CrCl 90 ml/min  LFTs: Alk Phos increased to 233, alb low at 2.5, Tbili WNL, LFTs WNL (10/2) TGs: 172 (10/2) Prealbumin: 9.1 (10/2), 10.3 (10/3)  Nutritional Goals:  Kcal: 2300-2500  Protein: 105-120  Fluid: 2300 ml daily  Clinimix E 5/20 at a goal rate of 90 ml/hr with 20% fat emulsion at 10 ml/hr will provide 2380 kcal/d, 108 gm protein/d, 2160 ml/d Clinimix E 5/20 at a rate of 60 ml/hr + Lipids 20% at 10 ml/hr will provide 72 gm protein, 1500 Kcal/day.  Plan:  At 1800 today:  Reduce Clinimix to 60 ml/hr, change to the E 5/15 formula Continue 20% fat emulsion at 52m/hr.  TNA to contain standard multivitamins and trace elements.  Continue Lantus 10 units BID Continue custom resistant scale SSI and CBG checks q6h TNA lab panels on Mondays & Thursdays.  F/u daily.  Thank you for the consult.  GMinda DittoPharmD Pager [phone]/02/2014, 7:16 AM

## 2014-08-20 DIAGNOSIS — E0940 Drug or chemical induced diabetes mellitus with neurological complications with diabetic neuropathy, unspecified: Secondary | ICD-10-CM

## 2014-08-20 DIAGNOSIS — D72829 Elevated white blood cell count, unspecified: Secondary | ICD-10-CM

## 2014-08-20 DIAGNOSIS — B3781 Candidal esophagitis: Secondary | ICD-10-CM

## 2014-08-20 LAB — DIFFERENTIAL
Basophils Absolute: 0.1 10*3/uL (ref 0.0–0.1)
Basophils Relative: 1 % (ref 0–1)
EOS PCT: 4 % (ref 0–5)
Eosinophils Absolute: 0.3 10*3/uL (ref 0.0–0.7)
Lymphocytes Relative: 24 % (ref 12–46)
Lymphs Abs: 1.9 10*3/uL (ref 0.7–4.0)
MONOS PCT: 17 % — AB (ref 3–12)
Monocytes Absolute: 1.3 10*3/uL — ABNORMAL HIGH (ref 0.1–1.0)
NEUTROS ABS: 4.2 10*3/uL (ref 1.7–7.7)
Neutrophils Relative %: 54 % (ref 43–77)

## 2014-08-20 LAB — COMPREHENSIVE METABOLIC PANEL
ALT: 15 U/L (ref 0–53)
AST: 21 U/L (ref 0–37)
Albumin: 2.7 g/dL — ABNORMAL LOW (ref 3.5–5.2)
Alkaline Phosphatase: 211 U/L — ABNORMAL HIGH (ref 39–117)
Anion gap: 12 (ref 5–15)
BUN: 11 mg/dL (ref 6–23)
CALCIUM: 9.7 mg/dL (ref 8.4–10.5)
CO2: 27 meq/L (ref 19–32)
Chloride: 101 mEq/L (ref 96–112)
Creatinine, Ser: 0.92 mg/dL (ref 0.50–1.35)
GFR calc Af Amer: >90 mL/min (ref >90)
GFR, EST NON AFRICAN AMERICAN: 83 mL/min — AB (ref >90)
GLUCOSE: 193 mg/dL — AB (ref 70–99)
POTASSIUM: 3.8 meq/L (ref 3.7–5.3)
SODIUM: 140 meq/L (ref 137–147)
TOTAL PROTEIN: 5.9 g/dL — AB (ref 6.0–8.3)
Total Bilirubin: 0.5 mg/dL (ref 0.3–1.2)

## 2014-08-20 LAB — GLUCOSE, CAPILLARY
GLUCOSE-CAPILLARY: 239 mg/dL — AB (ref 70–99)
Glucose-Capillary: 214 mg/dL — ABNORMAL HIGH (ref 70–99)
Glucose-Capillary: 293 mg/dL — ABNORMAL HIGH (ref 70–99)
Glucose-Capillary: 271 mg/dL — ABNORMAL HIGH (ref 70–99)

## 2014-08-20 LAB — CBC
HCT: 30.7 % — ABNORMAL LOW (ref 39.0–52.0)
HEMOGLOBIN: 10.1 g/dL — AB (ref 13.0–17.0)
MCH: 33.8 pg (ref 26.0–34.0)
MCHC: 32.9 g/dL (ref 30.0–36.0)
MCV: 102.7 fL — AB (ref 78.0–100.0)
Platelets: 332 10*3/uL (ref 150–400)
RBC: 2.99 MIL/uL — ABNORMAL LOW (ref 4.22–5.81)
RDW: 20.1 % — ABNORMAL HIGH (ref 11.5–15.5)
WBC: 7.7 10*3/uL (ref 4.0–10.5)

## 2014-08-20 LAB — PHOSPHORUS: PHOSPHORUS: 3.3 mg/dL (ref 2.3–4.6)

## 2014-08-20 LAB — MAGNESIUM: Magnesium: 1.4 mg/dL — ABNORMAL LOW (ref 1.5–2.5)

## 2014-08-20 LAB — PREALBUMIN: Prealbumin: 13.1 mg/dL — ABNORMAL LOW (ref 17.0–34.0)

## 2014-08-20 LAB — TRIGLYCERIDES: Triglycerides: 169 mg/dL — ABNORMAL HIGH (ref <150)

## 2014-08-20 MED ORDER — FAT EMULSION 20 % IV EMUL
250.0000 mL | INTRAVENOUS | Status: AC
  Administered 2014-08-20: 250 mL via INTRAVENOUS
  Filled 2014-08-20: qty 250

## 2014-08-20 MED ORDER — TRACE MINERALS CR-CU-F-FE-I-MN-MO-SE-ZN IV SOLN
INTRAVENOUS | Status: AC
  Administered 2014-08-20: 18:00:00 via INTRAVENOUS
  Filled 2014-08-20: qty 2000

## 2014-08-20 NOTE — Progress Notes (Signed)
Evan [name]   DOB:1941-09-10   TI#:[id number]   QQP#:[id number]  I have seen the patient, examined him and edited the notes as follows  Subjective: He reports no nausea, emesis or diarrhea. Abdominal pain resolved. He had bowel movement yesterday. He is on clear liquids and  total parenteral nutrition since10/1, tolerating it well. He wants to advance diet. Denies cardiac or respiratory complaints.No bleeding issues reported. Follows commands, no confusion noted. Ambulates around room.   Scheduled Meds: . enoxaparin (LOVENOX) injection  40 mg Subcutaneous Q24H  . feeding supplement (RESOURCE BREEZE)  1 Container Oral BID BM  . fluconazole (DIFLUCAN) IV  100 mg Intravenous Q24H  . insulin aspart  0-24 Units Subcutaneous 4 times per day  . insulin glargine  10 Units Subcutaneous BID   Continuous Infusions: . sodium chloride 15 mL/hr at 08/18/14 1800  . Marland KitchenTPN (CLINIMIX-E) Adult 60 mL/hr at 08/19/14 1730   And  . fat emulsion 250 mL (08/19/14 1730)  . pantoprozole (PROTONIX) infusion 8 mg/hr (08/19/14 1550)   PRN Meds:acetaminophen, HYDROmorphone (DILAUDID) injection, HYDROmorphone, ondansetron (ZOFRAN) IVPB ([facility])   Objective:  Filed Vitals:   08/20/14 0555  BP: 129/78  Pulse: 81  Temp: 97.9 F (36.6 C)  Resp: 16      Intake/Output Summary (Last 24 hours) at 08/20/14 0743 Last data filed at 08/20/14 0006  Gross per 24 hour  Intake   2200 ml  Output   2780 ml  Net   -580 ml    ECOG PERFORMANCE STATUS: 1-2  GENERAL:alert, no distress and comfortable SKIN: skin color, texture, turgor are normal, no rashes or significant lesions EYES: normal, conjunctiva are pink and non-injected, sclera clear OROPHARYNX:no exudate, no erythema and lips, buccal mucosa, and tongue normal. Oral mucosa moist NECK: supple, thyroid normal size, non-tender, without nodularity LYMPH:  no palpable lymphadenopathy in the cervical, axillary or inguinal LUNGS: clear to auscultation and percussion  with normal breathing effort HEART: regular rate & rhythm and no murmurs and trace of lower extremity edema ABDOMEN :abdomen soft, non distended, non-tender, bowel sounds present.  Musculoskeletal:no cyanosis of digits and no clubbing  PSYCH: alert & oriented x 3 with fluent speech NEURO: no focal motor/sensory deficits    CBG (last 3)   Recent Labs  08/19/14 1733 08/20/14 0004 08/20/14 0620  GLUCAP 246* 239* 214*     Labs:   Recent Labs Lab 08/13/14 1344 08/16/14 1415 08/17/14 0535 08/19/14 0440 08/20/14 0400  WBC 13.3* 7.7 7.1 6.1 7.7  HGB 10.5* 8.8* 9.4* 10.1* 10.1*  HCT 31.6* 26.3* 28.4* 30.1* 30.7*  PLT 376 369 389 338 332  MCV 100.9* 101.5* 101.8* 100.0 102.7*  MCH 33.6* 34.0 33.7 33.6 33.8  MCHC 33.3 33.5 33.1 33.6 32.9  RDW 21.4* 20.6* 20.2* 20.1* 20.1*  LYMPHSABS 0.7*  --  0.9  --  1.9  MONOABS 1.3*  --  1.0  --  1.3*  EOSABS 0.0  --  0.2  --  0.3  BASOSABS 0.1  --  0.1  --  0.1     Chemistries:    Recent Labs Lab 08/13/14 1344  08/14/14 0515 08/16/14 1415 08/17/14 0535 08/18/14 0531 08/19/14 0440 08/20/14 0400  NA 140  < > 141 135* 135* 137 139 140  K 3.3*  < > 3.8 4.2 3.9 3.5* 3.7 3.8  CL  --   < > 99 99 99 102 102 101  CO2 25  < > 25 23 25 25 24 27   GLUCOSE 110  < >  119* 96 169* 214* 228* 193*  BUN 9.1  < > 12 8 7 6 9 11   CREATININE 0.9  < > 0.88 0.87 0.91 0.86 0.84 0.92  CALCIUM 9.7  < > 8.8 8.2* 8.9 9.1 9.4 9.7  MG  --   --   --  1.5 1.5 1.6  --  1.4*  AST 35*  --  25 21 19   --   --  21  ALT 24  --  19 14 14   --   --  15  ALKPHOS 262*  --  235* 209* 233*  --   --  211*  BILITOT 1.06  --  0.8 0.5 0.5  --   --  0.5  < > = values in this interval not displayed.  GFR Estimated Creatinine Clearance: 93.8 ml/min (by C-G formula based on Cr of 0.92).  Liver Function Tests:  Recent Labs Lab 08/13/14 1344 08/14/14 0515 08/16/14 1415 08/17/14 0535 08/20/14 0400  AST 35* 25 21 19 21   ALT 24 19 14 14 15   ALKPHOS 262* 235* 209* 233*  211*  BILITOT 1.06 0.8 0.5 0.5 0.5  PROT 6.5 5.9* 5.3* 5.7* 5.9*  ALBUMIN 2.8* 2.6* 2.4* 2.5* 2.7*  CBG:  Recent Labs Lab 08/19/14 0538 08/19/14 1142 08/19/14 1733 08/20/14 0004 08/20/14 0620  GLUCAP 261* 259* 246* 239* 214*    Assessment/Plan: 73 y.o.  #1 uncontrolled nausea, vomiting and dehydration, improving On admission, he was placed on IV fluids at 125 cc/hr along with round the clock intravenous anti-emetics.  Recent abdominal x-ray did not show evidence of bowel obstruction. GI consultation was obtained on 9/29. CT scan of the abdomen and pelvis on 9/30 showed gastritis with no signs of disease progression. EGD on 9/30 by GI (Dr. Michail Sermon) showed a massive antral ulcer, possibly causing functional obstruction for the pylorus, likely induced by radiation. A duodenal bulb ulcer with erosive and candida esophagitis were noted. Biopsies were taken, with results negative for malignancy. He was  n.p.o with IV fluids and antiemetics, IV Protonix and IV Diflucan On 10/1 round the clock antiemetics were discontinued and changed to as needed basis since his nausea has resolved. As of 10/2, symptoms improved with no further emesis, diarrhea or dry heaves.   #2 leukocytosis with esophageal candidiasis Patient is started on intravenous fluconazole on 10/1 leukocytosis, likely reactive, resolved  #3 abdominal distention, pain and diarrhea.  Stool studies to exclude infection were performed on admission. C difficile negative.  Abdominal x-ray on 9/28 was negative for bowel obstruction.  Given pain medicine as needed intravenously, as prescribed. Patient's diarrhea subsided as of 9/30, no Imodium is indicated at this time abdominal x ray on 10/1 was negative for pneumoperitoneum or obstruction  #4 Gastric ulcer The patient is placed on high-dose Protonix. Appreciate GI involvement.   #5 anemia  Persistent anemia of neoplastic disease. He is not symptomatic.  Would transfuse if  hemoglobin drops to less than 8 g.   #6 pancreatic cancer  CA 19-9 was drawn on 9/29 at 259.4 (was 2197.1 in August 08/2014), thus, significantly reduced. CT scan showed no disease progression. Continue supportive care.  #7 DVT prophylaxis  On Lovenox   #8 hypokalemia  This is due to diarrhea.  Potasium levels have been normalized on 9/28  at 3.8 after IV replenishment   #9 protein calorie malnutrition  He was started on liquid diet, changed to NPO for procedures.  Nutritional consult was obtained on 9/29 recommending Resource Breeze po  BID,bland, easy to tolerate foods. Follow up appreciated He was placed on NPO until 10/1 with continued IV fluid resuscitation, then switched to clear liquid diet and total parenteral nutrition (TPN) by Pharmacy, per GI recommendation. Plan as per dietitian and GI. 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  As status improved, Lantus was re-placed in regimen with good tolerance  #11 severe constipation, resolved Round-the-clock Zofran has been discontinued. abdominal x ray on 10/1 was negative for pneumoperitoneum or obstruction. Last bowel movement on 10/4. Monitor carefully.  #12 discharge planning  The patient is readmitted to due to recent premature dismissal. Goals of care is discussed with the patient. Will discharge him once he is able to tolerate oral intake without nausea or vomiting.   #13 Code status  Full code  Rondel Jumbo, PA-C 08/20/2014  7:43 AM Laylynn Campanella, MD 08/20/2014

## 2014-08-20 NOTE — Progress Notes (Signed)
NUTRITION FOLLOW UP  Intervention:   -TPN management per pharmacy-  -Diet advancement per MD; recommend Bland diet for assistance in tolerance and promote gastric ulcer healing -Continue peach Resource Breeze po BID, each supplement provides 250 kcal and 9 grams of protein as tolerated -RD to continue to monitor TPN goal:  Clinimix 5/20 at 90 ml/hr plus 20% lipids at 10 ml/hr.  Pharmacist adjusting rate based on diet tolerance and progression.  Nutrition Dx:   Inadequate oral intake related to nausea/vomiting/abd pain as evidenced by PO intake < 75%, ongoing weight loss; progressing.   Goal:   Pt to meet >/= 90% of their estimated nutrition needs   New Goal: TPN + PO intake to meet >/= 90% estimated nutrition needs- progressing.  Monitor:   TPN tolerance, GI profile, total protein/kcal intake, labs, weights, supplement tolerance, education needs (ulcer nutrition therapy)  Assessment:   9/29: Pt known to RD from previous assessment on 08/08/2014. Endorsed poor PO for past 5 weeks d/t nausea, vomiting and early satiety. Had been trying different nutrition supplements, Glucerna or Boost Glucerna Control; however pt had been refusing them. Trialed MagicCup protein supplement for alternative;however pt reported disliking taste. PO intake was approximately 50%  -Pt reported tolerating minimal intake since d/c. Can consume some fluids and broth-based soups. Has not been drinking nutrition supplements as they exacerbate nausea and vomiting; however did note he tolerated them when used as a milkshake.  -Offered to prepare daily milkshakes for pt; declined as he was still experiencing nausea. Has only been able to tolerate beef broth.  -Was willing to trial [employer] supplement. Monitor CBGS and modify to milkshake/Ensure supplement as tolerated  -Pt continues to lose weight. Has lost 7 lbs in past two weeks (3% body weight loss, severe for time frame), and 25 lbs in past 3 months (11% body  weight loss, severe for time frame)  -Pt w/loose stools, C.diff results pending  10/01: -Per discussion with wife, pt continues with minimal intake (sips of Sprite) d/t nausea and NPO status for procedures. 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 TPN to be initiated to promote ulcer healing and d/t prolonged period of intolerance to PO intake/enteral nutrition. -Pt continues to be NPO for abd xray w/plan to advance to clear liquid diet after.  -Wife reported pt requesting food, which is an improvement over past month.  Requesting foods such as jello and popsicles, and RD to order [employer] for additional nutrition. Receiving diflucan and protonix, which wife noted has improved pt's nausea and promoted appetite -Per discussion with RN, pt has port access for TPN access, will likely be initiated today -Discussed pt's high risk of refeeding. with pharmacy. Plan for conservative advancement of TPN to goal rate- Pharm recommended goal of  Clinimix E 5/20 at 90 ml/hr with 20% lipids at 10 ml/hr to provide approximately 2380 kcal, and 108 gram protein (100% est kcal and protein needs)  -Pharm also to modify dextrose to saline to assist in controlling blood glucose -CBG <150 mg/dl -Refeeding labs pending  10/2: -Clear liquid diet-tolerating well -Resource Breeze finds it very sweet but can drink it.  Discussed adding ginger ale if desired to decrease sweetness. -Phosphorous 1.9 low, Magnesium 1.5 WNL, Potassium:  3.8 today WNL. -TPN (Clinimix 5/20) at 40 ml/hr with 20% lipids at 10 ml per hour providing:  1325 kcal, 48 gm protein  -TPN  to increase to 60 ml/hr with 20% lipids tonight to provide:  1747 kcal, 72 gm protein -TPN goal 90 ml/hr with 20% lipids to provide 2380 kcal and 108 gm protein per day.  10/2: -Diet advanced to Full Liquids and tolerated well this am.   Patient reported eating potato soup, chocolate ice cream, pudding, and milk. - TPN at 60 ml per hour (Clinimix 5/15) and 20% lipids at 10 ml/hr which is providing:  1502 kcal, 72 gm protein daily  Height: Ht Readings from Last 1 Encounters:  08/13/14 6\' 2"  (1.88 m)    Weight Status:   Wt Readings from Last 1 Encounters:  08/13/14 223 lb 15.8 oz (101.6 kg)    Re-estimated needs:  Kcal: 2300-2500  Protein: 105-120  Fluid: 2300 ml daily   Skin: +1 RLE edema, +2 LLE edema  Diet Order: Full Liquid   Intake/Output Summary (Last 24 hours) at 08/20/14 1342 Last data filed at 08/20/14 0006  Gross per 24 hour  Intake    840 ml  Output   2030 ml  Net  -1190 ml    Last BM: 9/29   Labs:   Recent Labs Lab 08/17/14 0535 08/18/14 0531 08/19/14 0440 08/20/14 0400  NA 135* 137 139 140  K 3.9 3.5* 3.7 3.8  CL 99 102 102 101  CO2 25 25 24 27   BUN 7 6 9 11   CREATININE 0.91 0.86 0.84 0.92  CALCIUM 8.9 9.1 9.4 9.7  MG 1.5 1.6  --  1.4*  PHOS 2.1* 2.8  --  3.3  GLUCOSE 169* 214* 228* 193*    CBG (last 3)   Recent Labs  08/20/14 0004 08/20/14 0620 08/20/14 1158  GLUCAP 239* 214* 293*    Scheduled Meds: . enoxaparin (LOVENOX) injection  40 mg Subcutaneous Q24H  . feeding supplement (RESOURCE BREEZE)  1 Container Oral BID BM  . fluconazole (DIFLUCAN) IV  100 mg Intravenous Q24H  . insulin aspart  0-24 Units Subcutaneous 4 times per day  . insulin glargine  10 Units Subcutaneous BID    Continuous Infusions: . sodium chloride 15 mL/hr at 08/18/14 1800  . Marland KitchenTPN (CLINIMIX-E) Adult 60 mL/hr at 08/19/14 1730   And  . fat emulsion 250 mL (08/19/14 1730)  . Marland KitchenTPN (CLINIMIX-E) Adult     And  . fat emulsion    . pantoprozole (PROTONIX) infusion 8 mg/hr (08/20/14 1331)    Antonieta Iba, RD, LDN Clinical Inpatient Dietitian Pager:  [phone] Weekend and after hours pager:  [phone]

## 2014-08-20 NOTE — Progress Notes (Signed)
PT Cancellation Note  Patient Details Name: Evan [name] MRN: [id number] DOB: 24-Nov-1940   Cancelled Treatment:    Reason Eval/Treat Not Completed: Other (comment) Pt declines PT today due to diarrhea however will check back as schedule permits.  Pt's spouse reports pt has been ambulating with family and doing well so will likely see to educate pt and spouse on exercises.   Rabia Argote,KATHrine E 08/20/2014, 11:38 AM Carmelia Bake, PT, DPT 08/20/2014 Pager: [phone]

## 2014-08-20 NOTE — Progress Notes (Signed)
Subjective: No abdominal pain. Some diarrhea this morning, after starting full liquid diet.  Objective: Vital signs in last 24 hours: Temp:  [97.9 F (36.6 C)-98.4 F (36.9 C)] 97.9 F (36.6 C) (10/05 0555) Pulse Rate:  [65-81] 81 (10/05 0555) Resp:  [16-18] 16 (10/05 0555) BP: (129-144)/(69-78) 129/78 mmHg (10/05 0555) SpO2:  [95 %-98 %] 95 % (10/05 0555) Weight change:  Last BM Date: 08/18/14  PE: GEN:  NAD ABD:  Soft, non-tender  Lab Results: CBC    Component Value Date/Time   WBC 7.7 08/20/2014 0400   WBC 13.3* 08/13/2014 1344   RBC 2.99* 08/20/2014 0400   RBC 3.14* 08/13/2014 1344   HGB 10.1* 08/20/2014 0400   HGB 10.5* 08/13/2014 1344   HCT 30.7* 08/20/2014 0400   HCT 31.6* 08/13/2014 1344   PLT 332 08/20/2014 0400   PLT 376 08/13/2014 1344   MCV 102.7* 08/20/2014 0400   MCV 100.9* 08/13/2014 1344   MCH 33.8 08/20/2014 0400   MCH 33.6* 08/13/2014 1344   MCHC 32.9 08/20/2014 0400   MCHC 33.3 08/13/2014 1344   RDW 20.1* 08/20/2014 0400   RDW 21.4* 08/13/2014 1344   LYMPHSABS 1.9 08/20/2014 0400   LYMPHSABS 0.7* 08/13/2014 1344   MONOABS 1.3* 08/20/2014 0400   MONOABS 1.3* 08/13/2014 1344   EOSABS 0.3 08/20/2014 0400   EOSABS 0.0 08/13/2014 1344   BASOSABS 0.1 08/20/2014 0400   BASOSABS 0.1 08/13/2014 1344   CMP     Component Value Date/Time   NA 140 08/20/2014 0400   NA 140 08/13/2014 1344   K 3.8 08/20/2014 0400   K 3.3* 08/13/2014 1344   CL 101 08/20/2014 0400   CL 107 04/27/2013 0926   CO2 27 08/20/2014 0400   CO2 25 08/13/2014 1344   GLUCOSE 193* 08/20/2014 0400   GLUCOSE 110 08/13/2014 1344   GLUCOSE 212* 04/27/2013 0926   BUN 11 08/20/2014 0400   BUN 9.1 08/13/2014 1344   CREATININE 0.92 08/20/2014 0400   CREATININE 0.9 08/13/2014 1344   CALCIUM 9.7 08/20/2014 0400   CALCIUM 9.7 08/13/2014 1344   PROT 5.9* 08/20/2014 0400   PROT 6.5 08/13/2014 1344   ALBUMIN 2.7* 08/20/2014 0400   ALBUMIN 2.8* 08/13/2014 1344   AST 21 08/20/2014 0400   AST 35* 08/13/2014 1344   ALT 15 08/20/2014  0400   ALT 24 08/13/2014 1344   ALKPHOS 211* 08/20/2014 0400   ALKPHOS 262* 08/13/2014 1344   BILITOT 0.5 08/20/2014 0400   BILITOT 1.06 08/13/2014 1344   GFRNONAA 83* 08/20/2014 0400   GFRAA >90 08/20/2014 0400   Assessment:  1.  Failure to thrive.  Likely multifactorial (pancreatic cancer, gastric ulcer).  Slowly improving. 2.  Pancreatic cancer, with Whipple's procedure tentatively scheduled for early November. 3.  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  Plan:  1.  Full liquids today; might consider to increase tomorrow. 2.  As we are able to advance peroral diet, would continue to try to mirror with appropriate down-titration of TPN dosing. 3.  Intravenous PPI today, might consider down-titration to peroral PPI over the next couple days. 4.  Continue OOBTC and ambulating halls, as tolerated. 5.  [facility] will follow.   Sherina Stammer,Chananya M 08/20/2014, 1:32 PM

## 2014-08-20 NOTE — Progress Notes (Signed)
Inpatient Diabetes Program Recommendations  AACE/ADA: New Consensus Statement on Inpatient Glycemic Control (2013)  Target Ranges:  Prepandial:   less than 140 mg/dL      Peak postprandial:   less than 180 mg/dL (1-2 hours)      Critically ill patients:  140 - 180 mg/dL     Results for Evan [name], Evan [name] (MRN [id number]) as of 08/20/2014 09:49  Ref. Range 08/18/2014 23:46 08/19/2014 05:38 08/19/2014 11:42 08/19/2014 17:33  Glucose-Capillary Latest Range: 70-99 mg/dL 203 (H) 261 (H) 259 (H) 246 (H)    Results for Evan [name], Evan [name] (MRN [id number]) as of 08/20/2014 09:49  Ref. Range 08/20/2014 00:04 08/20/2014 06:20  Glucose-Capillary Latest Range: 70-99 mg/dL 239 (H) 214 (H)     Current Orders: Lantus 10 units bid + Novolog SSI Q6 hours   **Note that patient was started on Full liquid diet today.  Per pharmacy notes, No insulin to be added to TPN yet.    MD/Pharmacy- Please consider changing Novolog SSI to Q4 hour coverage (currently ordered QID which is Q6 hours)     Will follow Wyn Quaker RN, MSN, CDE Diabetes Coordinator Inpatient Diabetes Program Team Pager: [phone] (8a-10p)

## 2014-08-20 NOTE — Progress Notes (Signed)
PARENTERAL NUTRITION CONSULT NOTE - Follow up  Pharmacy Consult for TNA Indication: intolerance to enteral feeding due to massive antral ulcer  Allergies  Allergen Reactions  . Ace Inhibitors   . Ciprofloxacin Rash   Patient Measurements: Height: '6\' 2"'  (188 cm) Weight: 223 lb 15.8 oz (101.6 kg) IBW/kg (Calculated) : 82.2  Vital Signs: Temp: 97.9 F (36.6 C) (10/05 0555) Temp Source: Oral (10/05 0555) BP: 129/78 mmHg (10/05 0555) Pulse Rate: 81 (10/05 0555) Intake/Output from previous day: 10/04 0701 - 10/05 0700 In: 2200 [P.O.:2200] Out: 3305 [Urine:3305] Intake/Output from this shift:    Labs:  Recent Labs  08/19/14 0440 08/20/14 0400  WBC 6.1 7.7  HGB 10.1* 10.1*  HCT 30.1* 30.7*  PLT 338 332    Recent Labs  08/18/14 0531 08/19/14 0440 08/20/14 0400  NA 137 139 140  K 3.5* 3.7 3.8  CL 102 102 101  CO2 '25 24 27  ' GLUCOSE 214* 228* 193*  BUN '6 9 11  ' CREATININE 0.86 0.84 0.92  CALCIUM 9.1 9.4 9.7  MG 1.6  --  1.4*  PHOS 2.8  --  3.3  PROT  --   --  5.9*  ALBUMIN  --   --  2.7*  AST  --   --  21  ALT  --   --  15  ALKPHOS  --   --  211*  BILITOT  --   --  0.5  PREALBUMIN 10.3*  --   --   TRIG  --   --  169*   Estimated Creatinine Clearance: 93.8 ml/min (by C-G formula based on Cr of 0.92).    Recent Labs  08/19/14 1733 08/20/14 0004 08/20/14 0620  GLUCAP 246* 239* 214*   Medical History: Past Medical History  Diagnosis Date  . Chronic back pain greater than 3 months duration   . Bulging discs   . Diabetes mellitus     type II; neuropathy;   . Hypertension   . High cholesterol   . Leukocytosis   . GERD (gastroesophageal reflux disease)   . Pancreatitis 2004  . Anxiety   . DDD (degenerative disc disease) 2005    lumbar spine  . History of smoking 08/01/2012  . Weight loss 08/01/2012  . Cough 08/01/2012  . 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 Left leg pain 08/05/2012  . 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 Chronic pancreatitis    . Pancreatic cancer 06/08/2014  . OSA (obstructive sleep apnea) 2010    does not use CPAP  . Nausea alone 06/13/2014  . Allergy   . Mucositis (ulcerative) due to antineoplastic therapy 07/02/2014  . Constipated 07/16/2014    Medications:  Scheduled:  . enoxaparin (LOVENOX) injection  40 mg Subcutaneous Q24H  . feeding supplement (RESOURCE BREEZE)  1 Container Oral BID BM  . fluconazole (DIFLUCAN) IV  100 mg Intravenous Q24H  . insulin aspart  0-24 Units Subcutaneous 4 times per day  . insulin glargine  10 Units Subcutaneous BID   Infusions:  . sodium chloride 15 mL/hr at 08/18/14 1800  . Marland KitchenTPN (CLINIMIX-E) Adult 60 mL/hr at 08/19/14 1730   And  . fat emulsion 250 mL (08/19/14 1730)  . pantoprozole (PROTONIX) infusion 8 mg/hr (08/19/14 1550)   Insulin Requirements in the past 24 hours:  36 units custom resistant SSI used, Lantus 10 units q12  Current Nutrition:  CLD- 75-100% intake - plan advance today Resource breeze BID- last charted   IVF: NS @ 15  ml/hr  ASSESSMENT  HPI: 73 yo obese man with pancreatic cancer, CML and recent chemoradiation who was found to have a massive cratered distal gastric ulcer on EGD and a 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 Biopsies from the antrum were NEGATIVE for cancer. Due to prolonged inability to tolerate adequate enteral nutrition, while this gastric ulcer heals, Pharmacy has been consulted to start TNA. Noted history of CKD and DM2.  Significant events:  10/2: no free air on abd Xray, patient will be given a trial of clear liquids 10/3: tolerating CL diet; increasing CBG, Lantus added 10/4: CBG's remain elevated with Clears, changing Clinimix to E 5/15  Today: 10/5 Glucose: CBGs still elevated - readings since new TNA started last PM - 239, 214 Electrolytes: stable Renal: SCr stable LFTs: Alk Phos elevated but dropped, alb low at 2.7, Tbili WNL, LFTs WNL (10/2) TGs: 172 (10/2), 169 (10/5) Prealbumin: 9.1 (10/2), 10.3  (10/3), pending (10/5)  Nutritional Goals:  Kcal: 2300-2500  Protein: 105-120  Fluid: 2300 ml daily  Clinimix E 5/20 at a goal rate of 90 ml/hr with 20% fat emulsion at 10 ml/hr will provide 2380 kcal/d, 108 gm protein/d, 2160 ml/d Currently, Clinimix E 5/15 at a rate of 60 ml/hr + Lipids 20% at 10 ml/hr will provide 72 gm protein, 1500 Kcal/day.  Plan:  At 1800 today:  Continue Clinimix E 5/15 formula and will continue current rate of 60 ml/hr. Note if patient does not tolerate diet advancement, will increase TNA rate further toward goals Continue 20% fat emulsion at 85m/hr.  TNA to contain standard multivitamins and trace elements.  Continue Lantus 10 units BID, but note that patient was on Lantus 15 units daily prior to admission and TNA is very likely causing CBG elevations Continue custom resistant scale SSI and CBG checks q6h TNA lab panels on Mondays & Thursdays.  F/u daily. Note [facility] GI note [facility] will follow up closely and hopefully start weaning TNA soon if patient tolerates diet advancement   JAdrian Saran PharmD, BCPS Pager [phone]/03/2014 8:39 AM

## 2014-08-21 LAB — GLUCOSE, CAPILLARY
GLUCOSE-CAPILLARY: 248 mg/dL — AB (ref 70–99)
GLUCOSE-CAPILLARY: 293 mg/dL — AB (ref 70–99)
Glucose-Capillary: 226 mg/dL — ABNORMAL HIGH (ref 70–99)
Glucose-Capillary: 249 mg/dL — ABNORMAL HIGH (ref 70–99)

## 2014-08-21 LAB — BASIC METABOLIC PANEL
Anion gap: 12 (ref 5–15)
BUN: 12 mg/dL (ref 6–23)
CHLORIDE: 102 meq/L (ref 96–112)
CO2: 28 meq/L (ref 19–32)
Calcium: 9.8 mg/dL (ref 8.4–10.5)
Creatinine, Ser: 0.95 mg/dL (ref 0.50–1.35)
GFR calc non Af Amer: 82 mL/min — ABNORMAL LOW (ref >90)
Glucose, Bld: 204 mg/dL — ABNORMAL HIGH (ref 70–99)
POTASSIUM: 4.1 meq/L (ref 3.7–5.3)
Sodium: 142 mEq/L (ref 137–147)

## 2014-08-21 MED ORDER — TRAZODONE HCL 100 MG PO TABS
100.0000 mg | ORAL_TABLET | Freq: Every day | ORAL | Status: DC
  Administered 2014-08-21 – 2014-08-22 (×2): 100 mg via ORAL
  Filled 2014-08-21 (×3): qty 1

## 2014-08-21 MED ORDER — TRACE MINERALS CR-CU-F-FE-I-MN-MO-SE-ZN IV SOLN
INTRAVENOUS | Status: AC
  Administered 2014-08-21: 18:00:00 via INTRAVENOUS
  Filled 2014-08-21: qty 1000

## 2014-08-21 MED ORDER — PANTOPRAZOLE SODIUM 40 MG IV SOLR
40.0000 mg | Freq: Two times a day (BID) | INTRAVENOUS | Status: DC
  Administered 2014-08-21 – 2014-08-22 (×2): 40 mg via INTRAVENOUS
  Filled 2014-08-21 (×3): qty 40

## 2014-08-21 MED ORDER — FAT EMULSION 20 % IV EMUL
250.0000 mL | INTRAVENOUS | Status: AC
  Administered 2014-08-21: 250 mL via INTRAVENOUS
  Filled 2014-08-21: qty 250

## 2014-08-21 NOTE — Progress Notes (Signed)
PARENTERAL NUTRITION CONSULT NOTE - Follow up  Pharmacy Consult for TNA Indication: intolerance to enteral feeding due to massive antral ulcer  Allergies  Allergen Reactions  . Ace Inhibitors   . Ciprofloxacin Rash   Patient Measurements: Height: _0  (188 cm) Weight: 223 lb 15.8 oz (101.6 kg) IBW/kg (Calculated) : 82.2  Vital Signs: Temp: 98.4 F (36.9 C) (10/06 1240) Temp Source: Oral (10/06 1240) BP: 136/80 mmHg (10/06 1240) Pulse Rate: 78 (10/06 1240) Intake/Output from previous day: 10/05 0701 - 10/06 0700 In: 600 [P.O.:600] Out: 2825 [Urine:2825] Intake/Output from this shift: Total I/O In: 840 [P.O.:840] Out: 900 [Urine:900]  Labs:  Recent Labs  08/19/14 0440 08/20/14 0400  WBC 6.1 7.7  HGB 10.1* 10.1*  HCT 30.1* 30.7*  PLT 338 332    Recent Labs  08/19/14 0440 08/20/14 0400 08/21/14 0455  NA 139 140 142  K 3.7 3.8 4.1  CL 102 101 102  CO2 _1 GLUCOSE 228* 193* 204*  BUN _2 CREATININE 0.84 0.92 0.95  CALCIUM 9.4 9.7 9.8  MG  --  1.4*  --   PHOS  --  3.3  --   PROT  --  5.9*  --   ALBUMIN  --  2.7*  --   AST  --  21  --   ALT  --  15  --   ALKPHOS  --  211*  --   BILITOT  --  0.5  --   PREALBUMIN  --  13.1*  --   TRIG  --  169*  --    Estimated Creatinine Clearance: 90.8 ml/min (by C-G formula based on Cr of 0.95).    Recent Labs  08/21/14 0043 08/21/14 0633 08/21/14 1136  GLUCAP 249* 226* 248*   Medical History: Past Medical History  Diagnosis Date  . Chronic back pain greater than 3 months duration   . Bulging discs   . Diabetes mellitus     type II; neuropathy;   . Hypertension   . High cholesterol   . Leukocytosis   . GERD (gastroesophageal reflux disease)   . Pancreatitis 2004  . Anxiety   . DDD (degenerative disc disease) 2005    lumbar spine  . History of smoking 08/01/2012  . Weight loss 08/01/2012  . Cough 08/01/2012  . 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 Left leg pain 08/05/2012  . 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 Chronic pancreatitis   . Pancreatic cancer 06/08/2014  . OSA (obstructive sleep apnea) 2010    does not use CPAP  . Nausea alone 06/13/2014  . Allergy   . Mucositis (ulcerative) due to antineoplastic therapy 07/02/2014  . Constipated 07/16/2014    Medications:  Scheduled:  . enoxaparin (LOVENOX) injection  40 mg Subcutaneous Q24H  . feeding supplement (RESOURCE BREEZE)  1 Container Oral BID BM  . fluconazole (DIFLUCAN) IV  100 mg Intravenous Q24H  . insulin aspart  0-24 Units Subcutaneous 4 times per day  . insulin glargine  10 Units Subcutaneous BID  . traZODone  100 mg Oral QHS   Infusions:  . sodium chloride 15 mL/hr at 08/18/14 1800  . Marland KitchenTPN (CLINIMIX-E) Adult 60 mL/hr at 08/20/14 1737   And  . fat emulsion 250 mL (08/20/14 1737)  . 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 Biopsies from the antrum were NEGATIVE for cancer. Due to prolonged inability to tolerate adequate enteral nutrition, while this gastric ulcer heals, Pharmacy has been consulted to start TNA. Noted history of CKD and DM2.  Significant events:  10/2: no free air on abd Xray, patient will be given a trial of clear liquids 10/3: tolerating CL diet; increasing CBG, Lantus added 10/4: CBG's remain elevated with Clears, changing Clinimix to E 5/15 10/6: patient advanced to soft diet, per Dr. Alvy Bimler, will start to wean TNA  Today: 10/6 Glucose: CBGs still elevated - readings since new TNA started last PM - 226-271 Electrolytes:  stable Renal: SCr stable LFTs: Alk Phos elevated but dropped, alb low at 2.7, Tbili WNL, LFTs WNL (10/2) TGs: 172 (10/2), 169 (10/5) Prealbumin: 9.1 (10/2), 10.3 (10/3), 13.1 (10/5)  Nutritional Goals:  Kcal: 2300-2500  Protein: 105-120  Fluid: 2300 ml daily  Clinimix E 5/20 at a goal rate of 90 ml/hr with 20% fat emulsion at 10 ml/hr will provide 2380 kcal/d, 108 gm protein/d, 2160 ml/d Currently, Clinimix E 5/15 at a rate of 40 ml/hr + Lipids 20% at 10 ml/hr will provide 48 gm protein, 1162 Kcal/day.  Plan:  At 1800 today:  Continue Clinimix E 5/15 formula and will decrease rate to 40 ml/hr. Note if patient does not tolerate diet advancement, will increase TNA rate further toward goal Continue 20% fat emulsion at 60m/hr.  TNA to contain standard multivitamins and trace elements.  Continue Lantus 10 units BID, but note that patient was on Lantus 15 units daily prior to admission and TNA is very likely causing CBG elevations Continue custom resistant scale SSI and CBG checks q6h TNA lab panels on Mondays & Thursdays.  F/u daily. Note diet advanced to soft today - will follow up closely and hopefully discontinue TNA soon if patient tolerates diet advancement   Thank you for the consult.  JCurrie Paris PharmD, BCPS Pager: [phone]: [phone]/04/2014 4:03 PM

## 2014-08-21 NOTE — Progress Notes (Signed)
NUTRITION FOLLOW UP  Intervention:   -72 hour calorie count order received and will start with dinner tonight. -TPN management per pharmacy-  -Diet advanced to soft -Continue peach Resource Breeze po BID, each supplement provides 250 kcal and 9 grams of protein as tolerated -RD to continue to monitor  Nutrition Dx:   Inadequate oral intake related to nausea/vomiting/abd pain as evidenced by PO intake < 75%, ongoing weight loss; progressing.   Goal:   Pt to meet >/= 90% of their estimated nutrition needs   New Goal: TPN + PO intake to meet >/= 90% estimated nutrition needs- progressing.  Monitor:   TPN tolerance, GI profile, total protein/kcal intake, labs, weights, supplement tolerance, education needs (ulcer nutrition therapy)  Assessment:   9/29: Pt known to RD from previous assessment on 08/08/2014. Endorsed poor PO for past 5 weeks d/t nausea, vomiting and early satiety. Had been trying different nutrition supplements, Glucerna or Boost Glucerna Control; however pt had been refusing them. Trialed MagicCup protein supplement for alternative;however pt reported disliking taste. PO intake was approximately 50%  -Pt reported tolerating minimal intake since d/c. Can consume some fluids and broth-based soups. Has not been drinking nutrition supplements as they exacerbate nausea and vomiting; however did note he tolerated them when used as a milkshake.  -Offered to prepare daily milkshakes for pt; declined as he was still experiencing nausea. Has only been able to tolerate beef broth.  -Was willing to trial [employer] supplement. Monitor CBGS and modify to milkshake/Ensure supplement as tolerated  -Pt continues to lose weight. Has lost 7 lbs in past two weeks (3% body weight loss, severe for time frame), and 25 lbs in past 3 months (11% body weight loss, severe for time frame)  -Pt w/loose stools, C.diff results pending  10/01: -Per discussion with wife, pt continues with minimal  intake (sips of Sprite) d/t nausea and NPO status for procedures. 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 TPN to be initiated to promote ulcer healing and d/t prolonged period of intolerance to PO intake/enteral nutrition. -Pt continues to be NPO for abd xray w/plan to advance to clear liquid diet after.  -Wife reported pt requesting food, which is an improvement over past month.  Requesting foods such as jello and popsicles, and RD to order [employer] for additional nutrition. Receiving diflucan and protonix, which wife noted has improved pt's nausea and promoted appetite -Per discussion with RN, pt has port access for TPN access, will likely be initiated today -Discussed pt's high risk of refeeding. with pharmacy. Plan for conservative advancement of TPN to goal rate- Pharm recommended goal of  Clinimix E 5/20 at 90 ml/hr with 20% lipids at 10 ml/hr to provide approximately 2380 kcal, and 108 gram protein (100% est kcal and protein needs)  -Pharm also to modify dextrose to saline to assist in controlling blood glucose -CBG <150 mg/dl -Refeeding labs pending  10/2: -Clear liquid diet-tolerating well -Resource Breeze finds it very sweet but can drink it.  Discussed adding ginger ale if desired to decrease sweetness. -Phosphorous 1.9 low, Magnesium 1.5 WNL, Potassium:  3.8 today WNL. -TPN (Clinimix 5/20) at 40 ml/hr with 20% lipids at 10 ml per hour providing:  1325 kcal, 48 gm protein  -TPN to increase to 60 ml/hr with 20% lipids tonight to provide:  1747 kcal, 72 gm protein -TPN goal 90 ml/hr with 20% lipids  to provide 2380 kcal and 108 gm protein per day.  10/5: -Diet advanced to Full Liquids and tolerated well this am.  Patient reported eating potato soup, chocolate ice cream, pudding, and milk. - TPN at 60 ml per hour (Clinimix 5/15) and 20% lipids at 10 ml/hr which  is providing:  1502 kcal, 72 gm protein daily  10/6: -Diet advanced to soft. -Tolerated full liquid diet well with good intake. -72 hour calorie count ordered to begin tonight -TPN (CLinimix 5/15) at 60 ml/hr to decrease to 40 ml/hr tonight.  TPN at 40 ml/hr plus 20% lipids at 10 ml/hr to provide:  1162 kcal, 48 gm protein daily.  Height: Ht Readings from Last 1 Encounters:  08/13/14 6\' 2"  (1.88 m)    Weight Status:   Wt Readings from Last 1 Encounters:  08/13/14 223 lb 15.8 oz (101.6 kg)    Re-estimated needs:  Kcal: 2300-2500  Protein: 105-120  Fluid: 2300 ml daily   Skin: +1 RLE edema, +2 LLE edema  Diet Order: Criss Rosales   Intake/Output Summary (Last 24 hours) at 08/21/14 1645 Last data filed at 08/21/14 1327  Gross per 24 hour  Intake   1200 ml  Output   3425 ml  Net  -2225 ml    Last BM: 9/29   Labs:   Recent Labs Lab 08/17/14 0535 08/18/14 0531 08/19/14 0440 08/20/14 0400 08/21/14 0455  NA 135* 137 139 140 142  K 3.9 3.5* 3.7 3.8 4.1  CL 99 102 102 101 102  CO2 25 25 24 27 28   BUN 7 6 9 11 12   CREATININE 0.91 0.86 0.84 0.92 0.95  CALCIUM 8.9 9.1 9.4 9.7 9.8  MG 1.5 1.6  --  1.4*  --   PHOS 2.1* 2.8  --  3.3  --   GLUCOSE 169* 214* 228* 193* 204*    CBG (last 3)   Recent Labs  08/21/14 0043 08/21/14 0633 08/21/14 1136  GLUCAP 249* 226* 248*    Scheduled Meds: . enoxaparin (LOVENOX) injection  40 mg Subcutaneous Q24H  . feeding supplement (RESOURCE BREEZE)  1 Container Oral BID BM  . fluconazole (DIFLUCAN) IV  100 mg Intravenous Q24H  . insulin aspart  0-24 Units Subcutaneous 4 times per day  . insulin glargine  10 Units Subcutaneous BID  . traZODone  100 mg Oral QHS    Continuous Infusions: . sodium chloride 15 mL/hr at 08/18/14 1800  . Marland KitchenTPN (CLINIMIX-E) Adult 60 mL/hr at 08/20/14 1737   And  . fat emulsion 250 mL (08/20/14 1737)  . Marland KitchenTPN (CLINIMIX-E) Adult     And  . fat emulsion    . pantoprozole (PROTONIX) infusion 8 mg/hr  (08/21/14 1100)    Antonieta Iba, RD, LDN Clinical Inpatient Dietitian Pager:  [phone] Weekend and after hours pager:  [phone]

## 2014-08-21 NOTE — Progress Notes (Signed)
Evan [name]   DOB:23-Apr-1941   ML#:[id number]   KCL#:[id number]  I have seen the patient, examined him and edited the notes as follows  Subjective:  Patient seen and examined. He denies any nausea, emesis or diarrhea. Denies abdominal pain or constipation. last bowel movement on 10/5. He is on full clear liquids since 10/5 and on total parenteral nutrition since 10/1, tolerating it well.  He wants to advance diet. Denies cardiac or respiratory complaints. No bleeding issues reported. Follows commands, no confusion noted. Ambulates in hallways with assistance.   Scheduled Meds: . enoxaparin (LOVENOX) injection  40 mg Subcutaneous Q24H  . feeding supplement (RESOURCE BREEZE)  1 Container Oral BID BM  . fluconazole (DIFLUCAN) IV  100 mg Intravenous Q24H  . insulin aspart  0-24 Units Subcutaneous 4 times per day  . insulin glargine  10 Units Subcutaneous BID   Continuous Infusions: . sodium chloride 15 mL/hr at 08/18/14 1800  . Marland KitchenTPN (CLINIMIX-E) Adult 60 mL/hr at 08/20/14 1737   And  . fat emulsion 250 mL (08/20/14 1737)  . pantoprozole (PROTONIX) infusion 8 mg/hr (08/20/14 1331)   PRN Meds:acetaminophen, HYDROmorphone (DILAUDID) injection, HYDROmorphone, ondansetron (ZOFRAN) IVPB ([facility])   Objective:  Filed Vitals:   08/21/14 0638  BP: 133/78  Pulse: 78  Temp: 98.2 F (36.8 C)  Resp: 16      Intake/Output Summary (Last 24 hours) at 08/21/14 0730 Last data filed at 08/21/14 0636  Gross per 24 hour  Intake    600 ml  Output   2825 ml  Net  -2225 ml    ECOG PERFORMANCE STATUS: 1-2  GENERAL:alert, no distress and comfortable SKIN: skin color, texture, turgor are normal, no rashes or significant lesions EYES: normal, conjunctiva are pink and non-injected, sclera clear OROPHARYNX:no exudate, no erythema and lips, buccal mucosa, and tongue normal. Oral mucosa moist NECK: supple, thyroid normal size, non-tender, without nodularity LYMPH:  no palpable lymphadenopathy in the  cervical, axillary or inguinal LUNGS: clear to auscultation and percussion with normal breathing effort HEART: regular rate & rhythm and no murmurs and trace of lower extremity edema ABDOMEN: soft, non distended, non-tender, bowel sounds present.  Musculoskeletal: no cyanosis of digits and no clubbing  PSYCH: alert & oriented x 3 with fluent speech NEURO: no focal motor/sensory deficits    CBG (last 3)   Recent Labs  08/20/14 1853 08/21/14 0043 08/21/14 0633  GLUCAP 271* 249* 226*     Labs:   Recent Labs Lab 08/16/14 1415 08/17/14 0535 08/19/14 0440 08/20/14 0400  WBC 7.7 7.1 6.1 7.7  HGB 8.8* 9.4* 10.1* 10.1*  HCT 26.3* 28.4* 30.1* 30.7*  PLT 369 389 338 332  MCV 101.5* 101.8* 100.0 102.7*  MCH 34.0 33.7 33.6 33.8  MCHC 33.5 33.1 33.6 32.9  RDW 20.6* 20.2* 20.1* 20.1*  LYMPHSABS  --  0.9  --  1.9  MONOABS  --  1.0  --  1.3*  EOSABS  --  0.2  --  0.3  BASOSABS  --  0.1  --  0.1     Chemistries:    Recent Labs Lab 08/16/14 1415 08/17/14 0535 08/18/14 0531 08/19/14 0440 08/20/14 0400 08/21/14 0455  NA 135* 135* 137 139 140 142  K 4.2 3.9 3.5* 3.7 3.8 4.1  CL 99 99 102 102 101 102  CO2 23 25 25 24 27 28   GLUCOSE 96 169* 214* 228* 193* 204*  BUN 8 7 6 9 11 12   CREATININE 0.87 0.91 0.86 0.84 0.92  0.95  CALCIUM 8.2* 8.9 9.1 9.4 9.7 9.8  MG 1.5 1.5 1.6  --  1.4*  --   AST 21 19  --   --  21  --   ALT 14 14  --   --  15  --   ALKPHOS 209* 233*  --   --  211*  --   BILITOT 0.5 0.5  --   --  0.5  --     GFR Estimated Creatinine Clearance: 90.8 ml/min (by C-G formula based on Cr of 0.95).  Liver Function Tests:  Recent Labs Lab 08/16/14 1415 08/17/14 0535 08/20/14 0400  AST 21 19 21   ALT 14 14 15   ALKPHOS 209* 233* 211*  BILITOT 0.5 0.5 0.5  PROT 5.3* 5.7* 5.9*  ALBUMIN 2.4* 2.5* 2.7*  CBG:  Recent Labs Lab 08/20/14 0620 08/20/14 1158 08/20/14 1853 08/21/14 0043 08/21/14 0633  GLUCAP 214* 293* 271* 249* 226*    Assessment/Plan: 73  y.o.  #1 uncontrolled nausea, vomiting and dehydration, resolved On admission, he was placed on IV fluids at 125 cc/hr along with round the clock intravenous anti-emetics.  Recent abdominal x-ray did not show evidence of bowel obstruction. GI consultation was obtained on 9/29. CT scan of the abdomen and pelvis on 9/30 showed gastritis with no signs of disease progression. EGD on 9/30 by GI (Dr. Michail Sermon) showed a massive antral ulcer, possibly causing functional obstruction for the pylorus, likely induced by radiation. A duodenal bulb ulcer with erosive and candida esophagitis were noted. Biopsies were taken, with results negative for malignancy. He was  n.p.o with IV fluids and antiemetics, IV Protonix and IV Diflucan On 10/1 round the clock antiemetics were discontinued and changed to as needed basis since his nausea has resolved. As of 10/2, symptoms improved with no further emesis, diarrhea or dry heaves.   #2 leukocytosis with esophageal candidiasis Patient was started on intravenous fluconazole on 10/1 leukocytosis, likely reactive, resolved  #3 abdominal distention, pain and diarrhea, resolved Stool studies to exclude infection were performed on admission. C difficile negative.  Abdominal x-ray on 9/28 was negative for bowel obstruction.  Given pain medicine as needed intravenously, as prescribed. Patient's diarrhea subsided as of 9/30, no Imodium is indicated at this time abdominal x ray on 10/1 was negative for pneumoperitoneum or obstruction  #4 Gastric ulcer felt to be due to radiation therapy The patient is placed on high-dose Protonix. Appreciate GI involvement.   #5 anemia  Persistent anemia of neoplastic disease. He is not symptomatic.  Would transfuse if hemoglobin drops to less than 8 g.   #6 pancreatic cancer  CA 19-9 was drawn on 9/29 at 259.4 (was 2197.1 in August 08/2014), thus, significantly reduced. CT scan showed no disease progression.  Whipple's procedure  likely scheduled for November 2015 Continue supportive care.  #7 DVT prophylaxis  On Lovenox   #8 hypokalemia  This is due to diarrhea.  Potasium levels have been normalized on 9/28  at 3.8 after IV replenishment   #9 protein calorie malnutrition  He was started on liquid diet, changed to NPO for procedures.  Nutritional consult was obtained on 9/29 recommending Resource Breeze po BID,bland, easy to tolerate foods. Follow up appreciated He was placed on NPO until 10/1 with continued IV fluid resuscitation, then switched to clear liquid diet and total parenteral nutrition (TPN) by Pharmacy, per GI recommendation.  He was placed on full liquid diet on 10/5 without any complications Likely to advance diet today. TNA is  at 48ml/hr of the planned 90 ml/hr Plan to advance to soft diet and initiate calorie count. 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  As status improved, Lantus was re-placed in regimen with good tolerance  #11 severe constipation, resolved Round-the-clock Zofran has been discontinued. abdominal x ray on 10/1 was negative for pneumoperitoneum or obstruction. Last bowel movement on 10/5. Monitor carefully.  #12 discharge planning  The patient is readmitted to due to recent premature dismissal. Goals of care is discussed with the patient. Will discharge him once he is able to tolerate oral intake without nausea or vomiting.   #13 Code status  Full code  Rondel Jumbo, PA-C 08/21/2014  7:30 AM Jessicca Stitzer, MD 08/21/2014

## 2014-08-22 ENCOUNTER — Other Ambulatory Visit: Payer: Medicare Other

## 2014-08-22 DIAGNOSIS — E0921 Drug or chemical induced diabetes mellitus with diabetic nephropathy: Secondary | ICD-10-CM

## 2014-08-22 LAB — GLUCOSE, CAPILLARY
GLUCOSE-CAPILLARY: 326 mg/dL — AB (ref 70–99)
Glucose-Capillary: 178 mg/dL — ABNORMAL HIGH (ref 70–99)
Glucose-Capillary: 203 mg/dL — ABNORMAL HIGH (ref 70–99)
Glucose-Capillary: 262 mg/dL — ABNORMAL HIGH (ref 70–99)
Glucose-Capillary: 266 mg/dL — ABNORMAL HIGH (ref 70–99)
Glucose-Capillary: 359 mg/dL — ABNORMAL HIGH (ref 70–99)

## 2014-08-22 MED ORDER — INSULIN REGULAR HUMAN 100 UNIT/ML IJ SOLN
INTRAVENOUS | Status: DC
  Administered 2014-08-22: 18:00:00 via INTRAVENOUS
  Filled 2014-08-22: qty 1000

## 2014-08-22 MED ORDER — FAT EMULSION 20 % IV EMUL
250.0000 mL | INTRAVENOUS | Status: DC
  Administered 2014-08-22: 250 mL via INTRAVENOUS
  Filled 2014-08-22: qty 250

## 2014-08-22 MED ORDER — ADULT MULTIVITAMIN W/MINERALS CH
1.0000 | ORAL_TABLET | Freq: Every day | ORAL | Status: DC
  Administered 2014-08-23: 1 via ORAL
  Filled 2014-08-22: qty 1

## 2014-08-22 MED ORDER — PANTOPRAZOLE SODIUM 40 MG PO TBEC
40.0000 mg | DELAYED_RELEASE_TABLET | Freq: Two times a day (BID) | ORAL | Status: DC
  Administered 2014-08-22 – 2014-08-23 (×2): 40 mg via ORAL
  Filled 2014-08-22 (×4): qty 1

## 2014-08-22 NOTE — Progress Notes (Signed)
Subjective: No abdominal pain. No nausea or vomiting. Tolerating soft diet.  Objective: Vital signs in last 24 hours: Temp:  [98.1 F (36.7 C)-98.4 F (36.9 C)] 98.4 F (36.9 C) (10/07 0555) Pulse Rate:  [68-81] 81 (10/07 0555) Resp:  [16-18] 18 (10/07 0555) BP: (130-138)/(65-80) 130/65 mmHg (10/07 0555) SpO2:  [98 %-99 %] 98 % (10/07 0555) Weight change:  Last BM Date: 08/21/14  PE: GEN:  NAD ABD:  Mild distended, soft, non-tender, active bowel sounds  Lab Results: CBC    Component Value Date/Time   WBC 7.7 08/20/2014 0400   WBC 13.3* 08/13/2014 1344   RBC 2.99* 08/20/2014 0400   RBC 3.14* 08/13/2014 1344   HGB 10.1* 08/20/2014 0400   HGB 10.5* 08/13/2014 1344   HCT 30.7* 08/20/2014 0400   HCT 31.6* 08/13/2014 1344   PLT 332 08/20/2014 0400   PLT 376 08/13/2014 1344   MCV 102.7* 08/20/2014 0400   MCV 100.9* 08/13/2014 1344   MCH 33.8 08/20/2014 0400   MCH 33.6* 08/13/2014 1344   MCHC 32.9 08/20/2014 0400   MCHC 33.3 08/13/2014 1344   RDW 20.1* 08/20/2014 0400   RDW 21.4* 08/13/2014 1344   LYMPHSABS 1.9 08/20/2014 0400   LYMPHSABS 0.7* 08/13/2014 1344   MONOABS 1.3* 08/20/2014 0400   MONOABS 1.3* 08/13/2014 1344   EOSABS 0.3 08/20/2014 0400   EOSABS 0.0 08/13/2014 1344   BASOSABS 0.1 08/20/2014 0400   BASOSABS 0.1 08/13/2014 1344   CMP     Component Value Date/Time   NA 142 08/21/2014 0455   NA 140 08/13/2014 1344   K 4.1 08/21/2014 0455   K 3.3* 08/13/2014 1344   CL 102 08/21/2014 0455   CL 107 04/27/2013 0926   CO2 28 08/21/2014 0455   CO2 25 08/13/2014 1344   GLUCOSE 204* 08/21/2014 0455   GLUCOSE 110 08/13/2014 1344   GLUCOSE 212* 04/27/2013 0926   BUN 12 08/21/2014 0455   BUN 9.1 08/13/2014 1344   CREATININE 0.95 08/21/2014 0455   CREATININE 0.9 08/13/2014 1344   CALCIUM 9.8 08/21/2014 0455   CALCIUM 9.7 08/13/2014 1344   PROT 5.9* 08/20/2014 0400   PROT 6.5 08/13/2014 1344   ALBUMIN 2.7* 08/20/2014 0400   ALBUMIN 2.8* 08/13/2014 1344   AST 21 08/20/2014 0400   AST 35* 08/13/2014  1344   ALT 15 08/20/2014 0400   ALT 24 08/13/2014 1344   ALKPHOS 211* 08/20/2014 0400   ALKPHOS 262* 08/13/2014 1344   BILITOT 0.5 08/20/2014 0400   BILITOT 1.06 08/13/2014 1344   GFRNONAA 82* 08/21/2014 0455   GFRAA >90 08/21/2014 0455   Assessment:  1. Failure to thrive. Likely multifactorial (pancreatic cancer, gastric ulcer). Continues to slowly improve.  2. Pancreatic cancer, with Whipple's procedure tentatively scheduled for early November.  3. 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  Plan:  1.  Advance diet and wean TPN as tolerated. 2.  BID PPI until further notice. 3.  No NSAIDs. 4.  Hopefully home in a day or two.   Landry Dyke 08/22/2014, 11:51 AM

## 2014-08-22 NOTE — Progress Notes (Signed)
Evan [name]   DOB:09-Feb-1941   LS#:[id number]   OTL#:[id number]  I have seen the patient, examined him and edited the notes as follows  Subjective:  Patient seen and examined.No new events overnight He denies any nausea, emesis or diarrhea. Denies abdominal pain or constipation. last bowel movement on 10/6. He is on soft diet -with calorie count since 10/6 and on total parenteral nutrition since 10/1,which is now being weaned off, currently at 40 ml/hr. Denies cardiac or respiratory complaints. No bleeding issues reported. Follows commands, no confusion noted. Ambulates in hallways with assistance.   Scheduled Meds: . enoxaparin (LOVENOX) injection  40 mg Subcutaneous Q24H  . feeding supplement (RESOURCE BREEZE)  1 Container Oral BID BM  . fluconazole (DIFLUCAN) IV  100 mg Intravenous Q24H  . insulin aspart  0-24 Units Subcutaneous 4 times per day  . insulin glargine  10 Units Subcutaneous BID  . pantoprazole (PROTONIX) IV  40 mg Intravenous Q12H  . traZODone  100 mg Oral QHS   Continuous Infusions: . sodium chloride 15 mL/hr at 08/18/14 1800  . Marland KitchenTPN (CLINIMIX-E) Adult 40 mL/hr at 08/21/14 1737   And  . fat emulsion 250 mL (08/21/14 1737)   PRN Meds:acetaminophen, HYDROmorphone (DILAUDID) injection, HYDROmorphone, ondansetron (ZOFRAN) IVPB ([facility])   Objective:  Filed Vitals:   08/22/14 0555  BP: 130/65  Pulse: 81  Temp: 98.4 F (36.9 C)  Resp: 18      Intake/Output Summary (Last 24 hours) at 08/22/14 0659 Last data filed at 08/22/14 0600  Gross per 24 hour  Intake   1995 ml  Output   2200 ml  Net   -205 ml    ECOG PERFORMANCE STATUS: 1-2  GENERAL:alert, no distress and comfortable SKIN: skin color, texture, turgor are normal, no rashes or significant lesions EYES: normal, conjunctiva are pink and non-injected, sclera clear OROPHARYNX:no exudate, no erythema and lips, buccal mucosa, and tongue normal. Oral mucosa moist NECK: supple, thyroid normal size, non-tender,  without nodularity LYMPH:  no palpable lymphadenopathy in the cervical, axillary or inguinal LUNGS: clear to auscultation and percussion with normal breathing effort HEART: regular rate & rhythm and no murmurs and trace of lower extremity edema ABDOMEN: soft, non distended, non-tender, bowel sounds present.  Musculoskeletal: no cyanosis of digits and no clubbing  PSYCH: alert & oriented x 3 with fluent speech NEURO: no focal motor/sensory deficits    CBG (last 3)   Recent Labs  08/21/14 1807 08/21/14 2351 08/22/14 0551  GLUCAP 293* 266* 178*     Labs:   Recent Labs Lab 08/16/14 1415 08/17/14 0535 08/19/14 0440 08/20/14 0400  WBC 7.7 7.1 6.1 7.7  HGB 8.8* 9.4* 10.1* 10.1*  HCT 26.3* 28.4* 30.1* 30.7*  PLT 369 389 338 332  MCV 101.5* 101.8* 100.0 102.7*  MCH 34.0 33.7 33.6 33.8  MCHC 33.5 33.1 33.6 32.9  RDW 20.6* 20.2* 20.1* 20.1*  LYMPHSABS  --  0.9  --  1.9  MONOABS  --  1.0  --  1.3*  EOSABS  --  0.2  --  0.3  BASOSABS  --  0.1  --  0.1     Chemistries:    Recent Labs Lab 08/16/14 1415 08/17/14 0535 08/18/14 0531 08/19/14 0440 08/20/14 0400 08/21/14 0455  NA 135* 135* 137 139 140 142  K 4.2 3.9 3.5* 3.7 3.8 4.1  CL 99 99 102 102 101 102  CO2 23 25 25 24 27 28   GLUCOSE 96 169* 214* 228* 193* 204*  BUN 8 7 6 9 11 12   CREATININE 0.87 0.91 0.86 0.84 0.92 0.95  CALCIUM 8.2* 8.9 9.1 9.4 9.7 9.8  MG 1.5 1.5 1.6  --  1.4*  --   AST 21 19  --   --  21  --   ALT 14 14  --   --  15  --   ALKPHOS 209* 233*  --   --  211*  --   BILITOT 0.5 0.5  --   --  0.5  --     GFR Estimated Creatinine Clearance: 90.8 ml/min (by C-G formula based on Cr of 0.95).  Liver Function Tests:  Recent Labs Lab 08/16/14 1415 08/17/14 0535 08/20/14 0400  AST 21 19 21   ALT 14 14 15   ALKPHOS 209* 233* 211*  BILITOT 0.5 0.5 0.5  PROT 5.3* 5.7* 5.9*  ALBUMIN 2.4* 2.5* 2.7*  CBG:  Recent Labs Lab 08/21/14 0633 08/21/14 1136 08/21/14 1807 08/21/14 2351  08/22/14 0551  GLUCAP 226* 248* 293* 266* 178*    Assessment/Plan: 73 y.o.  #1 uncontrolled nausea, vomiting and dehydration, resolved On admission, he was placed on IV fluids at 125 cc/hr along with round the clock intravenous anti-emetics.  Recent abdominal x-ray did not show evidence of bowel obstruction. GI consultation was obtained on 9/29. CT scan of the abdomen and pelvis on 9/30 showed gastritis with no signs of disease progression. EGD on 9/30 by GI (Dr. Michail Sermon) showed a massive antral ulcer, possibly causing functional obstruction for the pylorus, likely induced by radiation. A duodenal bulb ulcer with erosive and candida esophagitis were noted. Biopsies were taken, with results negative for malignancy. He was  n.p.o with IV fluids and antiemetics, IV Protonix and IV Diflucan On 10/1 round the clock antiemetics were discontinued and changed to as needed basis since his nausea has resolved. As of 10/2, symptoms improved with no further emesis, diarrhea or dry heaves.   #2 leukocytosis with esophageal candidiasis Patient was started on intravenous fluconazole on 10/1 leukocytosis, likely reactive, resolved  #3 abdominal distention, pain and diarrhea, resolved Stool studies to exclude infection were performed on admission. C difficile negative.  Abdominal x-ray on 9/28 was negative for bowel obstruction.  Given pain medicine as needed intravenously, as prescribed. Patient's diarrhea subsided as of 9/30, no Imodium is indicated at this time abdominal x ray on 10/1 was negative for pneumoperitoneum or obstruction  #4 Gastric ulcer felt to be due to radiation therapy The patient is placed on high-dose Protonix. Appreciate GI involvement.   #5 anemia  Persistent anemia of neoplastic disease. He is not symptomatic.  Would transfuse if hemoglobin drops to less than 8 g.   #6 pancreatic cancer  CA 19-9 was drawn on 9/29 at 259.4 (was 2197.1 in August 08/2014), thus,  significantly reduced. CT scan showed no disease progression.  Whipple's procedure likely scheduled for November 2015 Continue supportive care.  #7 DVT prophylaxis  On Lovenox   #8 hypokalemia  This is due to diarrhea.  Potasium levels have been normalized on 9/28  at 3.8 after IV replenishment   #9 protein calorie malnutrition  He was started on liquid diet, changed to NPO for procedures.  Nutritional consult was obtained on 9/29 recommending Resource Breeze po BID,bland, easy to tolerate foods. Follow up appreciated He was placed on NPO until 10/1 with continued IV fluid resuscitation, then switched to clear liquid diet and total parenteral nutrition (TPN) by Pharmacy, per GI recommendation.  He was placed on full  liquid diet on 10/5 without any complications He was advanced to soft diet with calorie counting on 10/6, tolerating it well. TNA is being weaned off after informing to Pharmacy of plans. 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  As status improved, Lantus was re-placed in regimen with good tolerance  #11 severe constipation, resolved Round-the-clock Zofran has been discontinued. abdominal x ray on 10/1 was negative for pneumoperitoneum or obstruction. Last bowel movement on 10/6. Monitor carefully.  #12 discharge planning  The patient was readmitted to due to recent premature dismissal. Goals of care is discussed with the patient. Will discharge him once he is able to tolerate oral intake without nausea or vomiting, possible Friday  #13 Code status  Full code  Rondel Jumbo, PA-C 08/22/2014  6:59 AM Evan Sonn, MD 08/22/2014

## 2014-08-22 NOTE — Progress Notes (Signed)
Calorie Count Note  72 hour calorie count ordered.  Diet: regular Supplements: Resource Breeze but patient dislikes and is not drinking. TPN continues at 40 ml/hr with 20% lipids at 10 ml/hr.  Breakfast: 469 kcal, 11 gm protein Lunch: 469 kcal, 13 gm protein Dinner: 589 kcal, 13 gm protein Snack:  220 kcal  Total intake: 1770 kcal (77% of minimum estimated needs)  37 protein (35% of minimum estimated needs)  Nutrition Dx: Inadequate oral intake related to nausea/vomiting/abd pain as evidenced by PO intake < 75%, ongoing weight loss; progressing.   Goal: TPN +PO intake to meet >/=90% estimated nutrition needs- progressing.  Intervention: Continue calorie count x 48 hours.  Discussed need to increase protein intake and sources of protein.  Patient will order meat for dinner and eggs in am.  Discontinue [employer].  Antonieta Iba, RD, LDN Clinical Inpatient Dietitian Pager:  [phone] Weekend and after hours pager:  [phone]

## 2014-08-22 NOTE — Progress Notes (Addendum)
PARENTERAL NUTRITION CONSULT NOTE - Follow up  Pharmacy Consult for TNA Indication: intolerance to enteral feeding due to massive antral ulcer  Allergies  Allergen Reactions  . Ace Inhibitors   . Ciprofloxacin Rash   Patient Measurements: Height: _0  (188 cm) Weight: 223 lb 15.8 oz (101.6 kg) IBW/kg (Calculated) : 82.2  Vital Signs: Temp: 98.1 F (36.7 C) (10/07 1337) Temp Source: Oral (10/07 1337) BP: 140/70 mmHg (10/07 1337) Pulse Rate: 76 (10/07 1337) Intake/Output from previous day: 10/06 0701 - 10/07 0700 In: 1995 [P.O.:1200; I.V.:120; TPN:675] Out: 2200 [Urine:2200] Intake/Output from this shift: Total I/O In: 480 [P.O.:480] Out: -   Labs:  Recent Labs  08/20/14 0400  WBC 7.7  HGB 10.1*  HCT 30.7*  PLT 332    Recent Labs  08/20/14 0400 08/21/14 0455  NA 140 142  K 3.8 4.1  CL 101 102  CO2 27 28  GLUCOSE 193* 204*  BUN 11 12  CREATININE 0.92 0.95  CALCIUM 9.7 9.8  MG 1.4*  --   PHOS 3.3  --   PROT 5.9*  --   ALBUMIN 2.7*  --   AST 21  --   ALT 15  --   ALKPHOS 211*  --   BILITOT 0.5  --   PREALBUMIN 13.1*  --   TRIG 169*  --    Estimated Creatinine Clearance: 90.8 ml/min (by C-G formula based on Cr of 0.95).    Recent Labs  08/22/14 0551 08/22/14 0732 08/22/14 1122  GLUCAP 178* 203* 359*   Medical History: Past Medical History  Diagnosis Date  . Chronic back pain greater than 3 months duration   . Bulging discs   . Diabetes mellitus     type II; neuropathy;   . Hypertension   . High cholesterol   . Leukocytosis   . GERD (gastroesophageal reflux disease)   . Pancreatitis 2004  . Anxiety   . DDD (degenerative disc disease) 2005    lumbar spine  . History of smoking 08/01/2012  . Weight loss 08/01/2012  . Cough 08/01/2012  . 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 Left leg pain 08/05/2012  . 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 Chronic pancreatitis   . Pancreatic cancer 06/08/2014  . OSA (obstructive sleep apnea) 2010   does not use CPAP  . Nausea alone 06/13/2014  . Allergy   . Mucositis (ulcerative) due to antineoplastic therapy 07/02/2014  . Constipated 07/16/2014    Medications:  Scheduled:  . enoxaparin (LOVENOX) injection  40 mg Subcutaneous Q24H  . feeding supplement (RESOURCE BREEZE)  1 Container Oral BID BM  . insulin aspart  0-24 Units Subcutaneous 4 times per day  . insulin glargine  10 Units Subcutaneous BID  . pantoprazole  40 mg Oral BID AC  . traZODone  100 mg Oral QHS   Infusions:  . sodium chloride 15 mL/hr at 08/18/14 1800  . Marland KitchenTPN (CLINIMIX-E) Adult 40 mL/hr at 08/21/14 1737   And  . fat emulsion 250 mL (08/21/14 1737)   Insulin Requirements in the past 24 hours:  48 units custom resistant SSI used, Lantus 10 units q12  Current Nutrition:  Soft diet- eating 100% Resource breeze BID- last charted 10/2  IVF: NS @ 15 ml/hr  ASSESSMENT  HPI: 73 yo obese man with pancreatic cancer, CML and recent chemoradiation who was found to have a massive cratered distal gastric ulcer on EGD and a 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 Biopsies from the  antrum were NEGATIVE for cancer. Due to prolonged inability to tolerate adequate enteral nutrition, while this gastric ulcer heals, Pharmacy has been consulted to start TNA. Noted history of CKD and DM2.  Significant events:  10/2: no free air on abd Xray, patient will be given a trial of clear liquids 10/3: tolerating CL diet; increasing CBG, Lantus added 10/4: CBG's remain elevated with Clears, changing Clinimix to E 5/15 10/6: patient advanced to soft diet, per Dr. Alvy Bimler, will start to wean TNA 10/7: tolerating soft diet, will stop TNA tonight at 1800  Today: 10/6 Glucose: CBGs still elevated - readings since new TNA started last PM - 203-359 Electrolytes: stable Renal: SCr stable LFTs: Alk Phos elevated but dropped, alb low at 2.7, Tbili WNL, LFTs WNL (10/2) TGs: 172 (10/2), 169 (10/5) Prealbumin: 9.1 (10/2), 10.3  (10/3), 13.1 (10/5)  Nutritional Goals:  Kcal: 2300-2500  Protein: 105-120  Fluid: 2300 ml daily  Clinimix E 5/20 at a goal rate of 90 ml/hr with 20% fat emulsion at 10 ml/hr will provide 2380 kcal/d, 108 gm protein/d, 2160 ml/d Currently, Clinimix E 5/15 at a rate of 40 ml/hr + Lipids 20% at 10 ml/hr will provide 48 gm protein, 1162 Kcal/day.  Plan:  At 1800 today:   Continue Clinimix E 5/15 formula at 40 ml/hr  Continue 20% fat emulsion at 18m/hr.   Add 20 units insulin to TNA  Start PO MVI as patient is tolerating POs  Continue Lantus 10 units BID, but note that patient was on Lantus 15 units daily prior to admission and TNA is very likely causing CBG elevations   Continue custom resistant scale SSI and CBG checks q6h   TNA lab panels on Mondays & Thursdays. F/u daily.   Note diet advanced to regular today - will follow up closely and hopefully discontinue TNA soon if patient tolerates diet advancement   Thank you for the consult.  JCurrie Paris PharmD, BCPS Pager: [phone]: [phone]/05/2014 2:21 PM

## 2014-08-23 ENCOUNTER — Encounter: Payer: Self-pay | Admitting: Hematology and Oncology

## 2014-08-23 ENCOUNTER — Encounter: Payer: Self-pay | Admitting: Specialist

## 2014-08-23 ENCOUNTER — Other Ambulatory Visit: Payer: Self-pay | Admitting: Hematology and Oncology

## 2014-08-23 ENCOUNTER — Telehealth: Payer: Self-pay | Admitting: Hematology and Oncology

## 2014-08-23 DIAGNOSIS — K259 Gastric ulcer, unspecified as acute or chronic, without hemorrhage or perforation: Secondary | ICD-10-CM

## 2014-08-23 DIAGNOSIS — K253 Acute gastric ulcer without hemorrhage or perforation: Secondary | ICD-10-CM

## 2014-08-23 DIAGNOSIS — C250 Malignant neoplasm of head of pancreas: Secondary | ICD-10-CM

## 2014-08-23 HISTORY — DX: Gastric ulcer, unspecified as acute or chronic, without hemorrhage or perforation: K25.9

## 2014-08-23 LAB — COMPREHENSIVE METABOLIC PANEL
ALBUMIN: 2.7 g/dL — AB (ref 3.5–5.2)
ALT: 16 U/L (ref 0–53)
AST: 20 U/L (ref 0–37)
Alkaline Phosphatase: 187 U/L — ABNORMAL HIGH (ref 39–117)
Anion gap: 9 (ref 5–15)
BUN: 13 mg/dL (ref 6–23)
CO2: 28 mEq/L (ref 19–32)
CREATININE: 1.03 mg/dL (ref 0.50–1.35)
Calcium: 9.5 mg/dL (ref 8.4–10.5)
Chloride: 102 mEq/L (ref 96–112)
GFR calc Af Amer: 82 mL/min — ABNORMAL LOW (ref >90)
GFR calc non Af Amer: 71 mL/min — ABNORMAL LOW (ref >90)
Glucose, Bld: 190 mg/dL — ABNORMAL HIGH (ref 70–99)
Potassium: 4.1 mEq/L (ref 3.7–5.3)
Sodium: 139 mEq/L (ref 137–147)
Total Bilirubin: 0.4 mg/dL (ref 0.3–1.2)
Total Protein: 5.7 g/dL — ABNORMAL LOW (ref 6.0–8.3)

## 2014-08-23 LAB — MAGNESIUM: MAGNESIUM: 1.6 mg/dL (ref 1.5–2.5)

## 2014-08-23 LAB — GLUCOSE, CAPILLARY
GLUCOSE-CAPILLARY: 162 mg/dL — AB (ref 70–99)
Glucose-Capillary: 354 mg/dL — ABNORMAL HIGH (ref 70–99)

## 2014-08-23 LAB — PHOSPHORUS: Phosphorus: 3 mg/dL (ref 2.3–4.6)

## 2014-08-23 MED ORDER — PANTOPRAZOLE SODIUM 40 MG PO TBEC: 40.0000 mg | DELAYED_RELEASE_TABLET | Freq: Every day | ORAL | Status: AC

## 2014-08-23 MED ORDER — HEPARIN SOD (PORK) LOCK FLUSH 100 UNIT/ML IV SOLN
500.0000 [IU] | INTRAVENOUS | Status: DC | PRN
  Filled 2014-08-23: qty 5

## 2014-08-23 NOTE — Progress Notes (Signed)
PARENTERAL NUTRITION CONSULT NOTE - Follow up  Pharmacy Consult for TNA Indication: intolerance to enteral feeding due to massive antral ulcer  Allergies  Allergen Reactions  . Ace Inhibitors   . Ciprofloxacin Rash   Patient Measurements: Height: _0  (188 cm) Weight: 223 lb 15.8 oz (101.6 kg) IBW/kg (Calculated) : 82.2  Vital Signs: Temp: 97.4 F (36.3 C) (10/08 0513) Temp Source: Oral (10/08 0513) BP: 131/67 mmHg (10/08 0513) Pulse Rate: 78 (10/08 0513) Intake/Output from previous day: 10/07 0701 - 10/08 0700 In: 1123 [P.O.:720; TPN:403] Out: 800 [Urine:800] Intake/Output from this shift:    Labs: No results found for this basename: WBC, HGB, HCT, PLT, APTT, INR,  in the last 72 hours  Recent Labs  08/21/14 0455 08/23/14 0422  NA 142 139  K 4.1 4.1  CL 102 102  CO2 28 28  GLUCOSE 204* 190*  BUN 12 13  CREATININE 0.95 1.03  CALCIUM 9.8 9.5  MG  --  1.6  PHOS  --  3.0  PROT  --  5.7*  ALBUMIN  --  2.7*  AST  --  20  ALT  --  16  ALKPHOS  --  187*  BILITOT  --  0.4   Estimated Creatinine Clearance: 83.7 ml/min (by C-G formula based on Cr of 1.03).    Recent Labs  08/22/14 1751 08/22/14 2354 08/23/14 0530  GLUCAP 326* 262* 162*   Medical History: Past Medical History  Diagnosis Date  . Chronic back pain greater than 3 months duration   . Bulging discs   . Diabetes mellitus     type II; neuropathy;   . Hypertension   . High cholesterol   . Leukocytosis   . GERD (gastroesophageal reflux disease)   . Pancreatitis 2004  . Anxiety   . DDD (degenerative disc disease) 2005    lumbar spine  . History of smoking 08/01/2012  . Weight loss 08/01/2012  . Cough 08/01/2012  . 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 Left leg pain 08/05/2012  . 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 Chronic pancreatitis   . Pancreatic cancer 06/08/2014  . OSA (obstructive sleep apnea) 2010    does not use CPAP  . Nausea alone 06/13/2014  . Allergy   . Mucositis  (ulcerative) due to antineoplastic therapy 07/02/2014  . Constipated 07/16/2014    Medications:  Scheduled:  . enoxaparin (LOVENOX) injection  40 mg Subcutaneous Q24H  . insulin aspart  0-24 Units Subcutaneous 4 times per day  . insulin glargine  10 Units Subcutaneous BID  . multivitamin with minerals  1 tablet Oral Daily  . pantoprazole  40 mg Oral BID AC  . traZODone  100 mg Oral QHS   Infusions:  . sodium chloride 15 mL/hr at 08/18/14 1800  . Marland KitchenTPN (CLINIMIX-E) Adult 40 mL/hr at 08/23/14 0539   And  . fat emulsion 250 mL (08/23/14 0539)   Insulin Requirements in the past 24 hours:  48 units custom resistant SSI used, Lantus 10 units q12  Current Nutrition:  Soft diet- eating 100% Resource breeze BID- last charted 10/2  IVF: NS @ 15 ml/hr  ASSESSMENT  HPI: 73 yo obese man with pancreatic cancer, CML and recent chemoradiation who was found to have a massive cratered distal gastric ulcer on EGD and a 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 Biopsies from the antrum were NEGATIVE for cancer. Due to prolonged inability to tolerate adequate enteral nutrition, while this gastric ulcer heals, Pharmacy has  been consulted to start TNA. Noted history of CKD and DM2.  Significant events:  10/2: no free air on abd Xray, patient will be given a trial of clear liquids 10/3: tolerating CL diet; increasing CBG, Lantus added 10/4: CBG's remain elevated with Clears, changing Clinimix to E 5/15 10/6: patient advanced to soft diet, per Dr. Alvy Bimler, will start to wean TNA 10/7: tolerating soft diet, will stop TNA tonight at 1800  Today: 10/8 Glucose: CBGs improved after adding insulin to TNA (162 this AM) Electrolytes: stable Renal: SCr stable LFTs: Alk Phos elevated though continues to trend down, alb low at 2.7, Tbili WNL, LFTs WNL  TGs: 172 (10/2), 169 (10/5) Prealbumin: 9.1 (10/2), 10.3 (10/3), 13.1 (10/5)  Nutritional Goals:  Kcal: 2300-2500  Protein: 105-120  Fluid:  2300 ml daily  Clinimix E 5/20 at a goal rate of 90 ml/hr with 20% fat emulsion at 10 ml/hr will provide 2380 kcal/d, 108 gm protein/d, 2160 ml/d Currently, Clinimix E 5/15 at a rate of 40 ml/hr + Lipids 20% at 10 ml/hr will provide 48 gm protein, 1162 Kcal/day.  Plan:   Per discussion with Dr. Alvy Bimler, we will continue to run his current TNA bag (Clinimix E 5/15 formula @ 40 ml/hr, 20% fat emulsion at 29m/hr with 20 units of insulin) throughout the day today, and then stop the TNA after bag is completed, as patient may be discharged today. Dr. GAlvy Bimlerwill re-evaluate the patient and his diet tolerance around 1600 today, and we will follow-up with her plans.  Continue Lantus 10 units BID, but note that patient was on Lantus 15 units daily prior to admission and TNA is very likely causing CBG elevations   Continue custom resistant scale SSI and CBG checks q6h   TNA lab panels on Mondays & Thursdays. F/u daily.   KSheliah Mends PharmD Clinical Pharmacist-Resident Pager: [phone] 593610/06/2014 10:03 AM

## 2014-08-23 NOTE — Care Management Note (Signed)
CARE MANAGEMENT NOTE 08/23/2014  Patient:  Evan [name], Evan [name]   Account Number:  [id number]  Date Initiated:  08/17/2014  Documentation initiated by:  Marney Doctor  Subjective/Objective Assessment:   73 yo admitted with nausea and vomiting.  Hx of pancreatic ca.     Action/Plan:   From home with wife and [facility] HH   Anticipated DC Date:  08/21/2014   Anticipated DC Plan:  [place]  CM consult      Choice offered to / List presented to:             Status of service:  In process, will continue to follow Medicare Important Message given?  YES (If response is "NO", the following Medicare IM given date fields will be blank) Date Medicare IM given:  08/17/2014 Medicare IM given by:  Marney Doctor Date Additional Medicare IM given:  08/23/2014 Additional Medicare IM given by:  [facility] Aaliyan Brinkmeier  Discharge Disposition:    Per UR Regulation:  Reviewed for med. necessity/level of care/duration of stay  If discussed at [place] of Stay Meetings, dates discussed:   08/23/2014    Comments:  08/23/14 Marney Doctor RN,BSN,NCM Pt to DC today or tomorrow per MD note.  RN alerted that HHPT order will be needed.  [facility] rep informed of potential DC.  08/17/14 Marney Doctor RN,BSN,NCM Pt admitted from home with wife. He began TPN and lipids on 08/16/14.  He had [place] services through [facility] prior to admission.  Hospital rep is following while in hospital and will need resumption orders at DC.  CM will follow for needs.

## 2014-08-23 NOTE — Progress Notes (Signed)
Pleasant visit with family. Patient was smiling broadly at prospect of going home.  Assessed spiritual and emotional support; they have strong support from church and family.  Encouraged focus on present. Provided them with my contact information for future support.  Epifania Gore, PhD, [place]

## 2014-08-23 NOTE — Progress Notes (Signed)
Evan [name]   DOB:1941-02-14   ZO#:[id number]   JWJ#:[id number]  I have seen the patient, examined him and edited the notes as follows  Subjective:  Patient seen and examined.No new events overnight He denies any nausea, emesis or diarrhea. Denies abdominal pain or constipation. last bowel movement on 10/7. He is on regular diet -with calorie count since 10/6 and on total parenteral nutrition since 10/1,which is now being weaned off, currently at 40 ml/hr. Denies cardiac or respiratory complaints. No bleeding issues reported. Follows commands, no confusion noted. Ambulates in hallways with assistance.   Scheduled Meds: . enoxaparin (LOVENOX) injection  40 mg Subcutaneous Q24H  . insulin aspart  0-24 Units Subcutaneous 4 times per day  . insulin glargine  10 Units Subcutaneous BID  . multivitamin with minerals  1 tablet Oral Daily  . pantoprazole  40 mg Oral BID AC  . traZODone  100 mg Oral QHS   Continuous Infusions: . sodium chloride 15 mL/hr at 08/18/14 1800  . Marland KitchenTPN (CLINIMIX-E) Adult 40 mL/hr at 08/23/14 0539   And  . fat emulsion 250 mL (08/23/14 0539)   PRN Meds:acetaminophen, HYDROmorphone (DILAUDID) injection, HYDROmorphone, ondansetron (ZOFRAN) IVPB ([facility])   Objective:  Filed Vitals:   08/23/14 0513  BP: 131/67  Pulse: 78  Temp: 97.4 F (36.3 C)  Resp: 16      Intake/Output Summary (Last 24 hours) at 08/23/14 0726 Last data filed at 08/23/14 0539  Gross per 24 hour  Intake   1123 ml  Output    800 ml  Net    323 ml    ECOG PERFORMANCE STATUS: 1-2  GENERAL:alert, no distress and comfortable SKIN: skin color, texture, turgor are normal, no rashes or significant lesions EYES: normal, conjunctiva are pink and non-injected, sclera clear OROPHARYNX:no exudate, no erythema and lips, buccal mucosa, and tongue normal. Oral mucosa moist NECK: supple, thyroid normal size, non-tender, without nodularity LYMPH:  no palpable lymphadenopathy in the cervical, axillary or  inguinal LUNGS: clear to auscultation and percussion with normal breathing effort HEART: regular rate & rhythm and no murmurs and trace of lower extremity edema ABDOMEN: soft, non distended, non-tender, bowel sounds present.  Musculoskeletal: no cyanosis of digits and no clubbing  PSYCH: alert & oriented x 3 with fluent speech NEURO: no focal motor/sensory deficits    CBG (last 3)   Recent Labs  08/22/14 1751 08/22/14 2354 08/23/14 0530  GLUCAP 326* 262* 162*     Labs:   Recent Labs Lab 08/16/14 1415 08/17/14 0535 08/19/14 0440 08/20/14 0400  WBC 7.7 7.1 6.1 7.7  HGB 8.8* 9.4* 10.1* 10.1*  HCT 26.3* 28.4* 30.1* 30.7*  PLT 369 389 338 332  MCV 101.5* 101.8* 100.0 102.7*  MCH 34.0 33.7 33.6 33.8  MCHC 33.5 33.1 33.6 32.9  RDW 20.6* 20.2* 20.1* 20.1*  LYMPHSABS  --  0.9  --  1.9  MONOABS  --  1.0  --  1.3*  EOSABS  --  0.2  --  0.3  BASOSABS  --  0.1  --  0.1     Chemistries:    Recent Labs Lab 08/16/14 1415 08/17/14 0535 08/18/14 0531 08/19/14 0440 08/20/14 0400 08/21/14 0455 08/23/14 0422  NA 135* 135* 137 139 140 142 139  K 4.2 3.9 3.5* 3.7 3.8 4.1 4.1  CL 99 99 102 102 101 102 102  CO2 23 25 25 24 27 28 28   GLUCOSE 96 169* 214* 228* 193* 204* 190*  BUN 8 7 6  9 11 12 13   CREATININE 0.87 0.91 0.86 0.84 0.92 0.95 1.03  CALCIUM 8.2* 8.9 9.1 9.4 9.7 9.8 9.5  MG 1.5 1.5 1.6  --  1.4*  --  1.6  AST 21 19  --   --  21  --  20  ALT 14 14  --   --  15  --  16  ALKPHOS 209* 233*  --   --  211*  --  187*  BILITOT 0.5 0.5  --   --  0.5  --  0.4    GFR Estimated Creatinine Clearance: 83.7 ml/min (by C-G formula based on Cr of 1.03).  Liver Function Tests:  Recent Labs Lab 08/16/14 1415 08/17/14 0535 08/20/14 0400 08/23/14 0422  AST 21 19 21 20   ALT 14 14 15 16   ALKPHOS 209* 233* 211* 187*  BILITOT 0.5 0.5 0.5 0.4  PROT 5.3* 5.7* 5.9* 5.7*  ALBUMIN 2.4* 2.5* 2.7* 2.7*  CBG:  Recent Labs Lab 08/22/14 0732 08/22/14 1122 08/22/14 1751  08/22/14 2354 08/23/14 0530  GLUCAP 203* 359* 326* 262* 162*    Assessment/Plan: 73 y.o.  #1 uncontrolled nausea, vomiting and dehydration, resolved On admission, he was placed on IV fluids at 125 cc/hr along with round the clock intravenous anti-emetics.  Recent abdominal x-ray did not show evidence of bowel obstruction. GI consultation was obtained on 9/29. CT scan of the abdomen and pelvis on 9/30 showed gastritis with no signs of disease progression. EGD on 9/30 by GI (Dr. Michail Sermon) showed a massive antral ulcer, possibly causing functional obstruction for the pylorus, likely induced by radiation. A duodenal bulb ulcer with erosive and candida esophagitis were noted. Biopsies were taken, with results negative for malignancy. He was  n.p.o with IV fluids and antiemetics, IV Protonix and IV Diflucan On 10/1 round the clock antiemetics were discontinued and changed to as needed basis since his nausea has resolved. As of 10/2, symptoms improved with no further emesis, diarrhea or dry heaves.   #2 leukocytosis with esophageal candidiasis Patient was started on intravenous fluconazole on 10/1, last day today. leukocytosis, likely reactive, resolved  #3 abdominal distention, pain and diarrhea, resolved Stool studies to exclude infection were performed on admission. C difficile negative.  Abdominal x-ray on 9/28 was negative for bowel obstruction.  Given pain medicine as needed intravenously, as prescribed. 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 Appreciate GI involvement.   #5 anemia  Persistent anemia of neoplastic disease. He is not symptomatic.  Would transfuse if hemoglobin drops to less than 8 g.   #6 pancreatic cancer  CA 19-9 was drawn on 9/29 at 259.4 (was  2197.1 in August 08/2014), thus, significantly reduced. CT scan showed no disease progression.  Whipple's procedure likely scheduled for November 2015 Continue supportive care.  #7 DVT prophylaxis  On Lovenox   #8 hypokalemia  This is due to diarrhea.  Potasium levels have been normalized on 9/28  at 3.8 after IV replenishment   #9 protein calorie malnutrition  He was started on liquid diet, changed to NPO for procedures.  Nutritional consult was obtained on 9/29 recommending Resource Breeze po BID,bland, easy to tolerate foods. Follow up appreciated He was placed on NPO until 10/1 with continued IV fluid resuscitation, then switched to clear liquid  diet and total parenteral nutrition (TPN) by Pharmacy, per GI recommendation.  He was placed on full liquid diet on 10/5 without any complications He was advanced to soft diet with calorie counting on 10/6, regular diet on 10/7, tolerating it well. TNA is being weaned off after informing to Pharmacy of plans. He has advanced to regular diet, protein rich, on 67/3, without complications, today is day 2 of 3 of calorie counting as recommended by Nutritionist. 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  As status improved, Lantus was re-placed in regimen with good tolerance  #11 severe constipation, resolved Round-the-clock Zofran has been discontinued. abdominal x ray on 10/1 was negative for pneumoperitoneum or obstruction. Last bowel movement on 10/7. Monitor carefully.  #12 discharge planning  The patient was readmitted to due to recent premature dismissal. Goals of care is discussed with the patient. Will discharge him once he is able to tolerate oral intake without nausea or vomiting, possibly today or Friday 10/9. #13 Code status  Full  code  Rondel Jumbo, PA-C 08/23/2014  7:26 AM  Letcher Schweikert, MD 08/23/2014

## 2014-08-23 NOTE — Progress Notes (Signed)
Subjective: No abdominal pain.  Tolerating diet. No nausea, vomiting.  Objective: Vital signs in last 24 hours: Temp:  [97.4 F (36.3 C)-98.1 F (36.7 C)] 97.4 F (36.3 C) (10/08 0513) Pulse Rate:  [76-78] 78 (10/08 0513) Resp:  [16-17] 16 (10/08 0513) BP: (131-140)/(67-70) 131/67 mmHg (10/08 0513) SpO2:  [98 %-99 %] 98 % (10/08 0513) Weight change:  Last BM Date: 08/21/14  PE: GEN:  NAD ABD:  Soft  Lab Results: CBC    Component Value Date/Time   WBC 7.7 08/20/2014 0400   WBC 13.3* 08/13/2014 1344   RBC 2.99* 08/20/2014 0400   RBC 3.14* 08/13/2014 1344   HGB 10.1* 08/20/2014 0400   HGB 10.5* 08/13/2014 1344   HCT 30.7* 08/20/2014 0400   HCT 31.6* 08/13/2014 1344   PLT 332 08/20/2014 0400   PLT 376 08/13/2014 1344   MCV 102.7* 08/20/2014 0400   MCV 100.9* 08/13/2014 1344   MCH 33.8 08/20/2014 0400   MCH 33.6* 08/13/2014 1344   MCHC 32.9 08/20/2014 0400   MCHC 33.3 08/13/2014 1344   RDW 20.1* 08/20/2014 0400   RDW 21.4* 08/13/2014 1344   LYMPHSABS 1.9 08/20/2014 0400   LYMPHSABS 0.7* 08/13/2014 1344   MONOABS 1.3* 08/20/2014 0400   MONOABS 1.3* 08/13/2014 1344   EOSABS 0.3 08/20/2014 0400   EOSABS 0.0 08/13/2014 1344   BASOSABS 0.1 08/20/2014 0400   BASOSABS 0.1 08/13/2014 1344   CMP     Component Value Date/Time   NA 139 08/23/2014 0422   NA 140 08/13/2014 1344   K 4.1 08/23/2014 0422   K 3.3* 08/13/2014 1344   CL 102 08/23/2014 0422   CL 107 04/27/2013 0926   CO2 28 08/23/2014 0422   CO2 25 08/13/2014 1344   GLUCOSE 190* 08/23/2014 0422   GLUCOSE 110 08/13/2014 1344   GLUCOSE 212* 04/27/2013 0926   BUN 13 08/23/2014 0422   BUN 9.1 08/13/2014 1344   CREATININE 1.03 08/23/2014 0422   CREATININE 0.9 08/13/2014 1344   CALCIUM 9.5 08/23/2014 0422   CALCIUM 9.7 08/13/2014 1344   PROT 5.7* 08/23/2014 0422   PROT 6.5 08/13/2014 1344   ALBUMIN 2.7* 08/23/2014 0422   ALBUMIN 2.8* 08/13/2014 1344   AST 20 08/23/2014 0422   AST 35* 08/13/2014 1344   ALT 16 08/23/2014 0422   ALT 24 08/13/2014 1344   ALKPHOS 187* 08/23/2014 0422   ALKPHOS 262* 08/13/2014 1344   BILITOT 0.4 08/23/2014 0422   BILITOT 1.06 08/13/2014 1344   GFRNONAA 71* 08/23/2014 0422   GFRAA 82* 08/23/2014 0422   Assessment:  1. Failure to thrive. Likely multifactorial (pancreatic cancer, gastric ulcer). Slowly improving.  2. Pancreatic cancer, with Whipple's procedure tentatively scheduled for early November.  3. 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  Plan:  1.  OK to discharge home from GI perspective. 2.  Pantoprazole 40 mg po bid x 8 weeks, then 40 mg po qd indefinitely thereafter. 3.  Will sign-off; please call with questions; patient can follow-up with [place] on as-needed basis.   Kileen Lange,Carsten M 08/23/2014, 1:00 PM

## 2014-08-23 NOTE — Progress Notes (Signed)
Patient was stable at time of discharge. I reviewed discharge education with patient's wife. She verbalized understanding and had no further questions. I deaccessed port with saline and heparin flush.

## 2014-08-23 NOTE — Progress Notes (Signed)
Physical Therapy Treatment Patient Details Name: Evan [name] MRN: [id number] DOB: [birth year] Today's Date: 08/23/2014    History of Present Illness 73 year old male with a history of diabetes mellitus, CML, hypertension, and newly diagnosed pancreatic adenocarcinoma (July 2015) admitted with uncontrolled nausea, vomiting and dehydration.    PT Comments    Recommend HHPT for balance and endurance. Patient does present with  Decreased balance during ambulation withoutUE support.  Follow Up Recommendations  Home health PT;Supervision/Assistance - 24 hour     Equipment Recommendations  None recommended by PT    Recommendations for Other Services       Precautions / Restrictions Precautions Precautions: Fall Precaution Comments: neuropathy Restrictions Weight Bearing Restrictions: No    Mobility  Bed Mobility Overal bed mobility: Independent                Transfers Overall transfer level: Needs assistance Equipment used: None;1 person hand held assist (iv pole, ) Transfers: Sit to/from Stand Sit to Stand: Min guard;Supervision         General transfer comment: cues for safety, lines and tubes  Ambulation/Gait Ambulation/Gait assistance: Min guard;Min assist Ambulation Distance (Feet): 600 Feet (with IV pole)   Gait Pattern/deviations: Step-through pattern;Staggering left;Staggering right;Shuffle Gait velocity: gait is festinating with no UE support, definitely imbalanced without UE support, upper trunk with forward inclination   General Gait Details: pt pushed IV pole for support first 600 feet then  without assistive device, occasional increase in gait speed with head tilted forward however able self correct, noted losses of balance when turns head to look side to side  And backwards. Wife reports patient takes himself to BR pushing IV pole. Encouraged patient to have assistance due to safety, balance losses.   Stairs            Wheelchair  Mobility    Modified Rankin (Stroke Patients Only)       Balance Overall balance assessment: Needs assistance         Standing balance support: No upper extremity supported;During functional activity Standing balance-Leahy Scale: Fair               High level balance activites: Direction changes;Turns;Sudden stops;Head turns High Level Balance Comments: today, loss of balance with 360 turning, stopping and starts. used sandles    Cognition Arousal/Alertness: Awake/alert Behavior During Therapy: Impulsive                        Exercises      General Comments        Pertinent Vitals/Pain Pain Assessment: No/denies pain    Home Living                      Prior Function            PT Goals (current goals can now be found in the care plan section) Progress towards PT goals: Progressing toward goals    Frequency  Min 3X/week    PT Plan Current plan remains appropriate    Co-evaluation             End of Session   Activity Tolerance: Patient tolerated treatment well Patient left: in chair;with call bell/phone within reach;with family/visitor present     Time: 3235-5732 PT Time Calculation (min): 25 min  Charges:  $Gait Training: 23-37 mins                    G Codes:  Claretha Cooper 08/23/2014, 10:47 AM Tresa Endo PT [phone]

## 2014-08-23 NOTE — Telephone Encounter (Signed)
s.w pt wife and advised on OCT appt....pt ok and aware °

## 2014-08-23 NOTE — Discharge Summary (Signed)
Patient ID: Evan [name] MRN: [id number] DOB/AGE: 03-06-1941 73 y.o.  Admit date: 08/13/2014 Discharge date: 08/23/2014  Primary Care Physician:  [name], MD I have seen the patient, examined him and edited the notes as follows  Brief HPI:  Evan [name] 73 y.o. male admitted of uncontrolled nausea, vomiting, diarrhea and weakness, in the setting of recent neoadjuvant chemoradiation therapy for pancreatic cancer.  He has a diagnosis of locally advanced pancreatic cancer and had just completed concurrent chemoradiation therapy.  A week prior, he had profound weakness, nausea and dehydration and was admitted to the hospital briefly for intravenous fluids and intravenous antiemetics. Since dismissal from the hospital, he had uncontrolled nausea, vomiting, right upper quadrant abdominal pain, poor oral intake and profound weakness. He was admitted again for management and evaluation of symptoms.   Discharge Diagnoses/ Hospital Course:    #1 uncontrolled nausea, vomiting and dehydration, resolved  On admission, he was placed on IV fluids at 125 cc/hr along with round the clock intravenous anti-emetics.  Recent abdominal x-ray did not show evidence of bowel obstruction.  GI consultation was obtained on 9/29.  CT scan of the abdomen and pelvis on 9/30 showed gastritis with no signs of disease progression.  EGD on 9/30 by GI (Dr. Michail Sermon) showed a massive antral ulcer, possibly causing functional obstruction for the pylorus, likely induced by radiation. A duodenal bulb ulcer with erosive and candida esophagitis were noted. Biopsies were taken, with results negative for malignancy.  He was n.p.o with IV fluids and antiemetics, IV Protonix and IV Diflucan  On 10/1 round the clock antiemetics were discontinued and changed to as needed basis since his nausea has resolved.  As of 10/2, symptoms improved with no further emesis, diarrhea or dry heaves.   #2 leukocytosis with  esophageal candidiasis  Patient was started on intravenous fluconazole on 10/1, last day today.  leukocytosis, likely reactive, resolved   #3 abdominal distention, pain and diarrhea, resolved  Stool studies to exclude infection were performed on admission. C difficile negative.  Abdominal x-ray on 9/28 was negative for bowel obstruction.  Given pain medicine as needed intravenously, as prescribed.  Patient's diarrhea subsided as of 9/30, no Imodium is indicated at this time  Abdominal x ray on 10/1 was negative for pneumoperitoneum or obstruction   #4 Gastric ulcer  felt to be due to radiation therapy  The patient was placed on high-dose Protonix twice daily for 8 weeks, then reduce it to once daily indefinitely as directed by GI physician.  #5 anemia  Persistent anemia of neoplastic disease. He is not symptomatic.  No transfusion was recommended as Hb did not drop to lower than 8  #6 pancreatic cancer  CA 19-9 was drawn on 9/29 at 259.4 (was 2197.1 in August 08/2014), thus, significantly reduced.  CT scan showed no disease progression.  Whipple's procedure likely scheduled for November 2015  Supportive care was given.   #7 DVT prophylaxis  On Lovenox   #8 hypokalemia  This is due to diarrhea.  Potasium levels have been normalized on 9/28 at 3.8 after IV replenishment   #9 protein calorie malnutrition  He was started on liquid diet, changed to NPO for procedures.  Nutritional consult was obtained on 9/29 recommending [employer] po BID, bland, easy to tolerate foods. Follow up appreciated  He was placed on NPO until 10/1 with continued IV fluid resuscitation, then switched to clear liquid diet and total parenteral nutrition (TPN) by Pharmacy, per GI recommendation.  He was placed on full liquid diet on 10/5 without any complications  He was advanced to soft diet with calorie counting on 10/6, regular diet on 10/7, tolerating it well. TNA is being weaned off after informing to  Pharmacy of plans.  He has advanced to regular diet, protein rich, on 16/1, without complications, today is day 3 of 3 of calorie counting as recommended by Nutritionist.  He tolerated this diet well, will continue on this upon discharge.  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  As status improved, Lantus was re-placed in regimen with good tolerance   #11 severe constipation, resolved  Round-the-clock Zofran has been discontinued.  abdominal x ray on 10/1 was negative for pneumoperitoneum or obstruction. Last bowel movement on 10/7.  He is to be discharged with bowel support  #12 discharge planning  The patient was readmitted to due to recent premature discharge home as his symptoms improved and he is able to tolerate solid diet.  #13 Code status  Full code   Diagnoses present on admission  Past Medical History  Diagnosis Date  . Chronic back pain greater than 3 months duration   . Bulging discs   . Diabetes mellitus     type II; neuropathy;   . Hypertension   . High cholesterol   . Leukocytosis   . GERD (gastroesophageal reflux disease)   . Pancreatitis 2004  . Anxiety   . DDD (degenerative disc disease) 2005    lumbar spine  . History of smoking 08/01/2012  . Weight loss 08/01/2012  . Cough 08/01/2012  . 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 Left leg pain 08/05/2012  . 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 Chronic pancreatitis   . Pancreatic cancer 06/08/2014  . OSA (obstructive sleep apnea) 2010    does not use CPAP  . Nausea alone 06/13/2014  . Allergy   . Mucositis (ulcerative) due to antineoplastic therapy 07/02/2014  . Constipation 07/16/2014      Discharge Medications:    Medication List    STOP taking these medications       lactulose 10 GM/15ML solution  Commonly known as:  CHRONULAC     magic mouthwash w/lidocaine Soln      morphine 30 MG 12 hr tablet  Commonly known as:  MS CONTIN      TAKE these medications       acetaminophen 325 MG tablet  Commonly known as:  TYLENOL  Take 650 mg by mouth every 6 (six) hours as needed for moderate pain (low grade fever).     aspirin EC 81 MG tablet  Take 81 mg by mouth daily.     cholecalciferol 1000 UNITS tablet  Commonly known as:  VITAMIN D  Take 1,000 Units by mouth daily.     dasatinib 50 MG tablet  Commonly known as:  SPRYCEL  Take 50 mg by mouth at bedtime.     diphenhydrAMINE 25 mg capsule  Commonly known as:  BENADRYL  Take 25 mg by mouth every 6 (six) hours as needed for itching (itching).     DSS 100 MG Caps  Take 100 mg by mouth 2 (two) times daily.     HYDROmorphone 4 MG tablet  Commonly known as:  DILAUDID  Take 4 mg by mouth every 4 (four) hours as needed for severe pain (pain).  insulin glargine 100 UNIT/ML injection  Commonly known as:  LANTUS  Inject 0.15 mLs (15 Units total) into the skin at bedtime.     lidocaine-prilocaine cream  Commonly known as:  EMLA  Apply 1 application topically as needed (pain).     losartan 100 MG tablet  Commonly known as:  COZAAR  Take 100 mg by mouth every morning.     metFORMIN 1000 MG tablet  Commonly known as:  GLUCOPHAGE  Take 1,000 mg by mouth 2 (two) times daily with a meal.     ONGLYZA 5 MG Tabs tablet  Generic drug:  saxagliptin HCl  Take 5 mg by mouth daily.     polyethylene glycol packet  Commonly known as:  MIRALAX / GLYCOLAX  Take 17 g by mouth 2 (two) times daily.     promethazine 25 MG tablet  Commonly known as:  PHENERGAN  Take 25 mg by mouth every 6 (six) hours as needed for nausea or vomiting (nausea).     senna 8.6 MG tablet  Commonly known as:  SENOKOT  Take 2 tablets by mouth daily.     simvastatin 20 MG tablet  Commonly known as:  ZOCOR  Take 20 mg by mouth every evening.     SUPER B COMPLEX/VITAMIN C PO  Take 1 tablet by mouth daily.     vitamin B-12  1000 MCG tablet  Commonly known as:  CYANOCOBALAMIN  Take 1,000 mcg by mouth daily.             pantoprazole 40 MG tablet  Commonly known as:  PROTONIX  Take 2 tablet (80 mg total) by mouth twice daily for 8 weeks, then one a day indefinitely    A prescription was written by Dr. Alvy Bimler    Diet:  Regular, protein rich  Activity:  as tolerated   ECOG PERFORMANCE STATUS: 0-1   Consults: Gastrointestinal, Dr. Michail Sermon 08/14/14                   Significant Diagnostic Studies:    Procedures: s/p EGD on 9/30 by GI (Dr. Michail Sermon)  Ct Abdomen Pelvis W Contrast  08/15/2014    COMPARISON:  CT 09/09/2010.  MRI 06/05/2014.  CT chest 06/06/2014.  FINDINGS: Small bilateral pleural effusions with compressive atelectasis in the lower lobes, new since prior chest CT. Heart is normal size. Diffuse coronary artery calcifications noted.  Metallic biliary stent is in place with decreasing biliary dilatation. Pneumobilia noted. Cystic area within the pancreatic body measures 1.9 cm. Previously seen pancreatic head mass on MRI difficult to visualize and measure by CT.  There is diffuse gastric wall thickening, most pronounced in the distal stomach. This is compatible with gastritis and may be related to postradiation changes if the patient has received radiation in this area. Spleen, adrenals are unremarkable. No focal hepatic abnormality. Small cysts in the right kidney. Calcifications in the hila bilaterally compatible with renovascular calcifications. Aorta is calcified, non aneurysmal.  Visualized large and small bowel are unremarkable.  IMPRESSION: Interval placement of metallic biliary stent with decreasing biliary duct dilatation. New pneumobilia.  Cystic area within the pancreatic body is stable. The solid pancreatic head mass seen on prior MRI cannot be visualized by CT. No visible local extension. Portal vein is patent.  Diffuse gastric wall thickening, most pronounced in the distal stomach,  question radiation gastritis.  Small bilateral pleural effusions with compressive atelectasis in the lower lobes.   Electronically Signed   By: Rolm Baptise  M.D.   On: 08/15/2014 09:27   Dg Abd 2 Views  08/16/2014   CLINICAL DATA:  74 year old male with acute onset abdominal pain in all 4 quadrants. Personal history of large distal gastric ulcer and pancreatic cancer. Initial encounter.  EXAM: ABDOMEN - 2 VIEW  COMPARISON:  CT Abdomen and Pelvis 08/15/2014.  FINDINGS: Upright and supine views. No pneumoperitoneum. Small bilateral pleural effusions.  Cholecystectomy clips. Metal CBD stent. Non obstructed bowel gas pattern. Stable visualized osseous structures.  IMPRESSION: No pneumoperitoneum.  Non obstructed bowel gas pattern.   Electronically Signed   By: Lars Pinks M.D.   On: 08/16/2014 15:35   Acute Abdominal Series  08/13/2014   CLINICAL DATA:  Abdominal pain with nausea and diarrhea  EXAM: ACUTE ABDOMEN SERIES (ABDOMEN 2 VIEW & CHEST 1 VIEW)  COMPARISON:  Chest radiograph June 14, 2014 ; CT abdomen and pelvis September 09, 2010  FINDINGS: PA chest: No edema or consolidation. Heart is upper normal in size with pulmonary vascularity within normal limits. Port-A-Cath tip is in the superior vena cava. No adenopathy.  Supine and left lateral decubitus images were obtained. There is a biliary regions stent. There are surgical clips in right upper quadrant. There is moderate stool in the colon. There is no appreciable bowel dilatation or air-fluid level to suggest obstruction. There is no demonstrable free air. There are vascular calcifications in the pelvis.  IMPRESSION: Bowel gas pattern unremarkable. No obstruction or free air seen. No lung edema or consolidation.   Electronically Signed   By: Lowella Grip M.D.   On: 08/13/2014 17:40    Discharge Laboratory Values: CBC Latest Ref Rng 08/20/2014 08/19/2014 08/17/2014  WBC 4.0 - 10.5 K/uL 7.7 6.1 7.1  Hemoglobin 13.0 - 17.0 g/dL 10.1(L) 10.1(L) 9.4(L)   Hematocrit 39.0 - 52.0 % 30.7(L) 30.1(L) 28.4(L)  Platelets 150 - 400 K/uL 332 338 389    CMP     Component Value Date/Time   NA 139 08/23/2014 0422   NA 140 08/13/2014 1344   K 4.1 08/23/2014 0422   K 3.3* 08/13/2014 1344   CL 102 08/23/2014 0422   CL 107 04/27/2013 0926   CO2 28 08/23/2014 0422   CO2 25 08/13/2014 1344   GLUCOSE 190* 08/23/2014 0422   GLUCOSE 110 08/13/2014 1344   GLUCOSE 212* 04/27/2013 0926   BUN 13 08/23/2014 0422   BUN 9.1 08/13/2014 1344   CREATININE 1.03 08/23/2014 0422   CREATININE 0.9 08/13/2014 1344   CALCIUM 9.5 08/23/2014 0422   CALCIUM 9.7 08/13/2014 1344   PROT 5.7* 08/23/2014 0422   PROT 6.5 08/13/2014 1344   ALBUMIN 2.7* 08/23/2014 0422   ALBUMIN 2.8* 08/13/2014 1344   AST 20 08/23/2014 0422   AST 35* 08/13/2014 1344   ALT 16 08/23/2014 0422   ALT 24 08/13/2014 1344   ALKPHOS 187* 08/23/2014 0422   ALKPHOS 262* 08/13/2014 1344   BILITOT 0.4 08/23/2014 0422   BILITOT 1.06 08/13/2014 1344   GFRNONAA 71* 08/23/2014 0422   GFRAA 82* 08/23/2014 0422    Physical Exam at Discharge:  No new events overnight. He denies any nausea, emesis or diarrhea. Denies abdominal pain or constipation. Last bowel movement on 10/7. He was on regular diet -with calorie count since 10/6 and on total parenteral nutrition since 10/1,then weaned off as of today. Denies cardiac or respiratory complaints. No bleeding issues reported. Follows commands, no confusion noted. Ambulates in hallways with assistance. He is ready for discharge in stable condition.He is to  continue to take Protonix as outpatient, twice daily for 8 weeks and then switch to once daily indefinitely. He is to see Dr. Alvy Bimler as outpatient on Friday, 10/16 at 9:15 after labs at 8:45. He knows to call our office with any questions or concerns.  BP 131/67  Pulse 78  Temp(Src) 97.4 F (36.3 C) (Oral)  Resp 16  Ht 6\' 2"  (1.88 m)  Wt 223 lb 15.8 oz (101.6 kg)  BMI 28.75 kg/m2  SpO2 98%  GENERAL:alert, no distress and  comfortable  SKIN: skin color, texture, turgor are normal, no rashes or significant lesions  EYES: normal, conjunctiva are pink and non-injected, sclera clear  OROPHARYNX:no exudate, no erythema and lips, buccal mucosa, and tongue normal. Oral mucosa moist  NECK: supple, thyroid normal size, non-tender, without nodularity  LYMPH: no palpable lymphadenopathy in the cervical, axillary or inguinal area LUNGS: clear to auscultation and percussion with normal breathing effort  HEART: regular rate & rhythm and no murmurs and trace of lower extremity edema  ABDOMEN: soft, non distended, non-tender, bowel sounds present.  Musculoskeletal: no cyanosis of digits and no clubbing  PSYCH: alert & oriented x 3 with fluent speech  NEURO: no focal motor/sensory deficits     Signed: Sharene Butters, PA-C   08/23/2014, 1:32 PM Mikhael Hendriks, MD 08/23/2014

## 2014-08-28 ENCOUNTER — Telehealth: Payer: Self-pay | Admitting: *Deleted

## 2014-08-28 NOTE — Telephone Encounter (Signed)
Received fax from [facility] stating [facility] will be shipped on 08/30/14

## 2014-08-30 ENCOUNTER — Telehealth: Payer: Self-pay | Admitting: *Deleted

## 2014-08-30 ENCOUNTER — Ambulatory Visit: Payer: Self-pay | Admitting: Podiatrist

## 2014-08-30 NOTE — Telephone Encounter (Signed)
Call from [place] at [facility] ph [phone] reports pt canceled PT today due to constipation and not feeling well.  He also asks for HGb A1-C results faxed to them at fax [phone] per Medicare Guidelines need Hgb A1-C documented w/i last 6 months.  Faxed them this result done in July 2015.

## 2014-08-30 NOTE — Telephone Encounter (Signed)
Opened note in error. Duplicate.

## 2014-08-30 NOTE — Progress Notes (Signed)
  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 This treatment consisted of a IMRT technique with daily image guidance.  Narrative: The patient tolerated radiation treatment relatively well.   The patient did fairly well in terms of GI toxicity acutely during his treatment.  Plan: The patient has completed radiation treatment. The patient will return to radiation oncology clinic for routine followup in one month. I advised the patient to call or return sooner if they have any questions or concerns related to their recovery or treatment. ________________________________  Jodelle Gross, M.D., Ph.D.

## 2014-08-31 ENCOUNTER — Encounter: Payer: Self-pay | Admitting: Hematology and Oncology

## 2014-08-31 ENCOUNTER — Ambulatory Visit (HOSPITAL_BASED_OUTPATIENT_CLINIC_OR_DEPARTMENT_OTHER): Payer: Medicare Other | Admitting: Hematology and Oncology

## 2014-08-31 ENCOUNTER — Other Ambulatory Visit: Payer: Self-pay | Admitting: Hematology and Oncology

## 2014-08-31 ENCOUNTER — Other Ambulatory Visit (HOSPITAL_BASED_OUTPATIENT_CLINIC_OR_DEPARTMENT_OTHER): Payer: Medicare Other

## 2014-08-31 VITALS — BP 115/63 | HR 69 | Temp 98.4°F | Resp 19 | Ht 74.0 in | Wt 214.3 lb

## 2014-08-31 DIAGNOSIS — C9211 Chronic myeloid leukemia, BCR/ABL-positive, in remission: Secondary | ICD-10-CM

## 2014-08-31 DIAGNOSIS — C250 Malignant neoplasm of head of pancreas: Secondary | ICD-10-CM

## 2014-08-31 DIAGNOSIS — K253 Acute gastric ulcer without hemorrhage or perforation: Secondary | ICD-10-CM

## 2014-08-31 DIAGNOSIS — Z23 Encounter for immunization: Secondary | ICD-10-CM

## 2014-08-31 DIAGNOSIS — D630 Anemia in neoplastic disease: Secondary | ICD-10-CM

## 2014-08-31 LAB — COMPREHENSIVE METABOLIC PANEL (CC13)
ALT: 19 U/L (ref 0–55)
ANION GAP: 10 meq/L (ref 3–11)
AST: 21 U/L (ref 5–34)
Albumin: 3.3 g/dL — ABNORMAL LOW (ref 3.5–5.0)
Alkaline Phosphatase: 190 U/L — ABNORMAL HIGH (ref 40–150)
BILIRUBIN TOTAL: 0.63 mg/dL (ref 0.20–1.20)
BUN: 18.3 mg/dL (ref 7.0–26.0)
CO2: 26 mEq/L (ref 22–29)
CREATININE: 1.2 mg/dL (ref 0.7–1.3)
Calcium: 10.3 mg/dL (ref 8.4–10.4)
Chloride: 105 mEq/L (ref 98–109)
Glucose: 172 mg/dl — ABNORMAL HIGH (ref 70–140)
Potassium: 4.8 mEq/L (ref 3.5–5.1)
Sodium: 141 mEq/L (ref 136–145)
Total Protein: 6.7 g/dL (ref 6.4–8.3)

## 2014-08-31 LAB — CBC WITH DIFFERENTIAL/PLATELET
BASO%: 1.7 % (ref 0.0–2.0)
Basophils Absolute: 0.1 10*3/uL (ref 0.0–0.1)
EOS%: 2.2 % (ref 0.0–7.0)
Eosinophils Absolute: 0.2 10*3/uL (ref 0.0–0.5)
HCT: 36.7 % — ABNORMAL LOW (ref 38.4–49.9)
HEMOGLOBIN: 11.9 g/dL — AB (ref 13.0–17.1)
LYMPH#: 1.7 10*3/uL (ref 0.9–3.3)
LYMPH%: 19.6 % (ref 14.0–49.0)
MCH: 33.2 pg (ref 27.2–33.4)
MCHC: 32.4 g/dL (ref 32.0–36.0)
MCV: 102.5 fL — AB (ref 79.3–98.0)
MONO#: 1 10*3/uL — ABNORMAL HIGH (ref 0.1–0.9)
MONO%: 11.3 % (ref 0.0–14.0)
NEUT#: 5.5 10*3/uL (ref 1.5–6.5)
NEUT%: 65.2 % (ref 39.0–75.0)
PLATELETS: 230 10*3/uL (ref 140–400)
RBC: 3.58 10*6/uL — ABNORMAL LOW (ref 4.20–5.82)
RDW: 18.1 % — ABNORMAL HIGH (ref 11.0–14.6)
WBC: 8.5 10*3/uL (ref 4.0–10.3)

## 2014-08-31 LAB — CANCER ANTIGEN 19-9: CA 19-9: 312.2 U/mL — ABNORMAL HIGH (ref <35.0)

## 2014-08-31 MED ORDER — INFLUENZA VAC SPLIT QUAD 0.5 ML IM SUSY
0.5000 mL | PREFILLED_SYRINGE | Freq: Once | INTRAMUSCULAR | Status: AC
  Administered 2014-08-31: 0.5 mL via INTRAMUSCULAR
  Filled 2014-08-31: qty 0.5

## 2014-08-31 NOTE — Assessment & Plan Note (Signed)
He'll continue on high-dose proton pump inhibitor.

## 2014-08-31 NOTE — Progress Notes (Signed)
[facility] OFFICE PROGRESS NOTE  Patient Care Team: [name], MD as PCP - General (Internal Medicine) [name], MD as Referring Physician (Specialist) Provider Not In System [name], MD as Consulting Physician (Hematology and Oncology)  SUMMARY OF ONCOLOGIC HISTORY: Oncology History   Pancreatic cancer   Primary site: Pancreas   Staging method: AJCC 7th Edition   Clinical: Stage IIA (T3, N0, M0) signed by [name], MD on 06/13/2014  4:37 PM   Summary: Stage IIA (T3, N0, M0)       Malignant neoplasm of head of pancreas   06/05/2014 Imaging  MRI abdomen showed 2.2 cm lesion in the pancreatic head obstructing the common bile duct and the pancreatic duct is highly concerning for pancreatic adenocarcinoma.  There is moderate intra and extrahepatic biliary duct dilatation.    06/05/2014 Tumor Marker CA 19-9 is elevated at 1234   06/06/2014 Imaging Ct scan of chest showed 2 lung nodules, likely benign   06/07/2014 Procedure He had ERCP and stent placement   06/07/2014 Procedure He had endoscopic ultrasound with  fine needle aspiration.   06/07/2014 Pathology Results Accession: [id number] FNA is positive for pancreatic adenoca   06/14/2014 Surgery He has port placement   06/25/2014 - 08/01/2014 Chemotherapy He received neoadjuvant chemotherapy with Gemzar   06/25/2014 - 08/06/2014 Radiation Therapy he received neoadjuvant radiation: 50.4 Gy   08/13/2014 - 08/23/2014 Hospital Admission The patient was admitted to the hospital related to uncontrolled nausea, vomiting and severe epigastric pain and was found to have significant gastric ulcer, resolved with conservative management and total parenteral nutrition    INTERVAL HISTORY: Please see below for problem oriented charting. He is seen as part of hospital followup. He is getting better and eating 100% by mouth without nausea. His wife has resumed some of his diabetes medicine. He denies recent nausea or  vomiting.  REVIEW OF SYSTEMS:   Constitutional: Denies fevers, chills or abnormal weight loss Eyes: Denies blurriness of vision Ears, nose, mouth, throat, and face: Denies mucositis or sore throat Respiratory: Denies cough, dyspnea or wheezes Cardiovascular: Denies palpitation, chest discomfort or lower extremity swelling Gastrointestinal:  Denies nausea, heartburn or change in bowel habits Skin: Denies abnormal skin rashes Lymphatics: Denies new lymphadenopathy or easy bruising Neurological:Denies numbness, tingling or new weaknesses Behavioral/Psych: Mood is stable, no new changes  All other systems were reviewed with the patient and are negative.  I have reviewed the past medical history, past surgical history, social history and family history with the patient and they are unchanged from previous note.  ALLERGIES:  is allergic to ace inhibitors and ciprofloxacin.  MEDICATIONS:  Current Outpatient Prescriptions  Medication Sig Dispense Refill  . acetaminophen (TYLENOL) 325 MG tablet Take 650 mg by mouth every 6 (six) hours as needed for moderate pain (low grade fever).       Marland Kitchen aspirin EC 81 MG tablet Take 81 mg by mouth daily.      . B Complex-C (SUPER B COMPLEX/VITAMIN C PO) Take 1 tablet by mouth daily.       . cholecalciferol (VITAMIN D) 1000 UNITS tablet Take 1,000 Units by mouth daily.      . dasatinib (SPRYCEL) 50 MG tablet Take 50 mg by mouth at bedtime.      . diphenhydrAMINE (BENADRYL) 25 mg capsule Take 25 mg by mouth every 6 (six) hours as needed for itching (itching).      Marland Kitchen docusate sodium 100 MG CAPS Take 100 mg by  mouth 2 (two) times daily.  60 capsule  0  . HYDROmorphone (DILAUDID) 4 MG tablet Take 4 mg by mouth every 4 (four) hours as needed for severe pain (pain).      . insulin glargine (LANTUS) 100 UNIT/ML injection Inject 22 Units into the skin at bedtime.      Marland Kitchen lactulose (CHRONULAC) 10 GM/15ML solution       . lidocaine-prilocaine (EMLA) cream Apply 1  application topically as needed (pain).      Marland Kitchen losartan (COZAAR) 100 MG tablet Take 100 mg by mouth every morning.       . metFORMIN (GLUCOPHAGE) 1000 MG tablet Take 1,000 mg by mouth 2 (two) times daily with a meal.      . morphine (MS CONTIN) 30 MG 12 hr tablet 30 mg every 12 (twelve) hours.       . pantoprazole (PROTONIX) 40 MG tablet Take 1 tablet (40 mg total) by mouth daily.  60 tablet  6  . polyethylene glycol (MIRALAX / GLYCOLAX) packet Take 17 g by mouth daily as needed.      . saxagliptin HCl (ONGLYZA) 5 MG TABS tablet Take 5 mg by mouth daily.      . sennosides-docusate sodium (SENOKOT-S) 8.6-50 MG tablet Take 1 tablet by mouth daily.      . simvastatin (ZOCOR) 20 MG tablet Take 20 mg by mouth every evening.      . vitamin B-12 (CYANOCOBALAMIN) 1000 MCG tablet Take 1,000 mcg by mouth daily.      . promethazine (PHENERGAN) 25 MG tablet Take 25 mg by mouth every 6 (six) hours as needed for nausea or vomiting (nausea).      . senna (SENOKOT) 8.6 MG tablet Take 2 tablets by mouth daily.        No current facility-administered medications for this visit.   Facility-Administered Medications Ordered in Other Visits  Medication Dose Route Frequency Provider Last Rate Last Dose  . heparin lock flush 100 unit/mL  500 Units Intravenous Once [name], MD      . sodium chloride 0.9 % injection 10 mL  10 mL Intravenous PRN [name], MD        PHYSICAL EXAMINATION: ECOG PERFORMANCE STATUS: 1 - Symptomatic but completely ambulatory  Filed Vitals:   08/31/14 0909  BP: 115/63  Pulse: 69  Temp: 98.4 F (36.9 C)  Resp: 19   Filed Weights   08/31/14 0909  Weight: 214 lb 4.8 oz (97.206 kg)    GENERAL:alert, no distress and comfortable SKIN: skin color, texture, turgor are normal, no rashes or significant lesions EYES: normal, Conjunctiva are pink and non-injected, sclera clear ABDOMEN:abdomen soft, non-tender and normal bowel sounds Musculoskeletal:no cyanosis of digits and no  clubbing  NEURO: alert & oriented x 3 with fluent speech, no focal motor/sensory deficits  LABORATORY DATA:  I have reviewed the data as listed    Component Value Date/Time   NA 141 08/31/2014 0854   NA 139 08/23/2014 0422   K 4.8 08/31/2014 0854   K 4.1 08/23/2014 0422   CL 102 08/23/2014 0422   CL 107 04/27/2013 0926   CO2 26 08/31/2014 0854   CO2 28 08/23/2014 0422   GLUCOSE 172* 08/31/2014 0854   GLUCOSE 190* 08/23/2014 0422   GLUCOSE 212* 04/27/2013 0926   BUN 18.3 08/31/2014 0854   BUN 13 08/23/2014 0422   CREATININE 1.2 08/31/2014 0854   CREATININE 1.03 08/23/2014 0422   CALCIUM 10.3 08/31/2014 0854   CALCIUM  9.5 08/23/2014 0422   PROT 6.7 08/31/2014 0854   PROT 5.7* 08/23/2014 0422   ALBUMIN 3.3* 08/31/2014 0854   ALBUMIN 2.7* 08/23/2014 0422   AST 21 08/31/2014 0854   AST 20 08/23/2014 0422   ALT 19 08/31/2014 0854   ALT 16 08/23/2014 0422   ALKPHOS 190* 08/31/2014 0854   ALKPHOS 187* 08/23/2014 0422   BILITOT 0.63 08/31/2014 0854   BILITOT 0.4 08/23/2014 0422   GFRNONAA 71* 08/23/2014 0422   GFRAA 82* 08/23/2014 0422    No results found for this basename: SPEP, UPEP,  kappa and lambda light chains    Lab Results  Component Value Date   WBC 8.5 08/31/2014   NEUTROABS 5.5 08/31/2014   HGB 11.9* 08/31/2014   HCT 36.7* 08/31/2014   MCV 102.5* 08/31/2014   PLT 230 08/31/2014      Chemistry      Component Value Date/Time   NA 141 08/31/2014 0854   NA 139 08/23/2014 0422   K 4.8 08/31/2014 0854   K 4.1 08/23/2014 0422   CL 102 08/23/2014 0422   CL 107 04/27/2013 0926   CO2 26 08/31/2014 0854   CO2 28 08/23/2014 0422   BUN 18.3 08/31/2014 0854   BUN 13 08/23/2014 0422   CREATININE 1.2 08/31/2014 0854   CREATININE 1.03 08/23/2014 0422      Component Value Date/Time   CALCIUM 10.3 08/31/2014 0854   CALCIUM 9.5 08/23/2014 0422   ALKPHOS 190* 08/31/2014 0854   ALKPHOS 187* 08/23/2014 0422   AST 21 08/31/2014 0854   AST 20 08/23/2014 0422   ALT 19 08/31/2014 0854   ALT  16 08/23/2014 0422   BILITOT 0.63 08/31/2014 0854   BILITOT 0.4 08/23/2014 0422      ASSESSMENT & PLAN:  Malignant neoplasm of head of pancreas He is doing very well. Continue active supportive care. Per discussion with his surgeon, I plan to order a CT scan of the pancreas to assess response to treatment.  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 He will continue taking his chemotherapy    Anemia in neoplastic disease This is likely due to recent treatment. The patient denies recent history of bleeding such as epistaxis, hematuria or hematochezia. [name] I will observe for now.      Gastric ulcer He'll continue on high-dose proton pump inhibitor.   No orders of the defined types were placed in this encounter.   All questions were answered. The patient knows to call the clinic with any problems, questions or concerns. No barriers to learning was detected. I spent 30 minutes counseling the patient face to face. The total time spent in the appointment was 40 minutes and more than 50% was on counseling and review of test results     [facility], Jaylianna Tatlock, MD 08/31/2014 8:21 PM

## 2014-08-31 NOTE — Assessment & Plan Note (Signed)
The patient has been in remission for the past year. He will continue taking his chemotherapy

## 2014-08-31 NOTE — Assessment & Plan Note (Signed)
This is likely due to recent treatment. The patient denies recent history of bleeding such as epistaxis, hematuria or hematochezia. [name] I will observe for now.    

## 2014-08-31 NOTE — Assessment & Plan Note (Signed)
He is doing very well. Continue active supportive care. Per discussion with his surgeon, I plan to order a CT scan of the pancreas to assess response to treatment.

## 2014-09-03 ENCOUNTER — Ambulatory Visit (INDEPENDENT_AMBULATORY_CARE_PROVIDER_SITE_OTHER): Payer: Medicare Other | Admitting: Podiatry

## 2014-09-03 ENCOUNTER — Telehealth: Payer: Self-pay | Admitting: Hematology and Oncology

## 2014-09-03 ENCOUNTER — Encounter: Payer: Self-pay | Admitting: Podiatry

## 2014-09-03 ENCOUNTER — Encounter: Payer: Self-pay | Admitting: Nurse Practitioner

## 2014-09-03 ENCOUNTER — Telehealth: Payer: Self-pay | Admitting: Nurse Practitioner

## 2014-09-03 VITALS — BP 125/70 | HR 103 | Resp 14 | Ht 74.0 in | Wt 214.0 lb

## 2014-09-03 DIAGNOSIS — G629 Polyneuropathy, unspecified: Secondary | ICD-10-CM

## 2014-09-03 DIAGNOSIS — E1142 Type 2 diabetes mellitus with diabetic polyneuropathy: Secondary | ICD-10-CM

## 2014-09-03 DIAGNOSIS — B351 Tinea unguium: Secondary | ICD-10-CM

## 2014-09-03 NOTE — Telephone Encounter (Signed)
S/w spouse [name]) confirmed MD visit per 10/16 POF.... Cherylann Banas

## 2014-09-03 NOTE — Patient Instructions (Signed)
Diabetes and Foot Care Diabetes may cause you to have problems because of poor blood supply (circulation) to your feet and legs. This may cause the skin on your feet to become thinner, break easier, and heal more slowly. Your skin may become dry, and the skin may peel and crack. You may also have nerve damage in your legs and feet causing decreased feeling in them. You may not notice minor injuries to your feet that could lead to infections or more serious problems. Taking care of your feet is one of the most important things you can do for yourself.  HOME CARE INSTRUCTIONS  Wear shoes at all times, even in the house. Do not go barefoot. Bare feet are easily injured.  Check your feet daily for blisters, cuts, and redness. If you cannot see the bottom of your feet, use a mirror or ask someone for help.  Wash your feet with warm water (do not use hot water) and mild soap. Then pat your feet and the areas between your toes until they are completely dry. Do not soak your feet as this can dry your skin.  Apply a moisturizing lotion or petroleum jelly (that does not contain alcohol and is unscented) to the skin on your feet and to dry, brittle toenails. Do not apply lotion between your toes.  Trim your toenails straight across. Do not dig under them or around the cuticle. File the edges of your nails with an emery board or nail file.  Do not cut corns or calluses or try to remove them with medicine.  Wear clean socks or stockings every day. Make sure they are not too tight. Do not wear knee-high stockings since they may decrease blood flow to your legs.  Wear shoes that fit properly and have enough cushioning. To break in new shoes, wear them for just a few hours a day. This prevents you from injuring your feet. Always look in your shoes before you put them on to be sure there are no objects inside.  Do not cross your legs. This may decrease the blood flow to your feet.  If you find a minor scrape,  cut, or break in the skin on your feet, keep it and the skin around it clean and dry. These areas may be cleansed with mild soap and water. Do not cleanse the area with peroxide, alcohol, or iodine.  When you remove an adhesive bandage, be sure not to damage the skin around it.  If you have a wound, look at it several times a day to make sure it is healing.  Do not use heating pads or hot water bottles. They may burn your skin. If you have lost feeling in your feet or legs, you may not know it is happening until it is too late.  Make sure your health care provider performs a complete foot exam at least annually or more often if you have foot problems. Report any cuts, sores, or bruises to your health care provider immediately. SEEK MEDICAL CARE IF:   You have an injury that is not healing.  You have cuts or breaks in the skin.  You have an ingrown nail.  You notice redness on your legs or feet.  You feel burning or tingling in your legs or feet.  You have pain or cramps in your legs and feet.  Your legs or feet are numb.  Your feet always feel cold. SEEK IMMEDIATE MEDICAL CARE IF:   There is increasing redness,   swelling, or pain in or around a wound.  There is a red line that goes up your leg.  Pus is coming from a wound.  You develop a fever or as directed by your health care provider.  You notice a bad smell coming from an ulcer or wound. Document Released: 10/30/2000 Document Revised: 07/05/2013 Document Reviewed: 04/11/2013 Exit[employer] Patient Information 2015 [employer]. This information is not intended to replace advice given to you by your health care provider. Make sure you discuss any questions you have with your health care provider.  

## 2014-09-03 NOTE — Telephone Encounter (Signed)
Left message with details for holding losartan and metformin 10/21, 10/22, 10/23 for scan per Dr. Alvy Bimler. Pt instructed to call with any questions.

## 2014-09-03 NOTE — Progress Notes (Signed)
Per Merrilee Seashore PT at [facility], needing a verbal order from Dr. Alvy Bimler for home Rn to draw hgb A1c on 09/06/14; last A1c obtained 06/05/14 and will expire 09/05/14. MD to be informed.

## 2014-09-03 NOTE — Telephone Encounter (Signed)
Message copied by Alla Feeling on Mon Sep 03, 2014  9:31 AM ------      Message from: Cathlean Cower      Created: Mon Sep 03, 2014  8:23 AM      Regarding: FW: CT scan                   ----- Message -----         From: [name], MD         Sent: 08/31/2014   8:23 PM           To: Cathlean Cower, RN, Ilean China      Subject: CT scan                                                  I placed order for CT scan. Please remind the patient he needs to hold Losartan and Metformin the day before, day of and day after CT scan ------

## 2014-09-03 NOTE — Progress Notes (Signed)
   Subjective:    Patient ID: Evan [name], male    DOB: 1941-09-21, 73 y.o.   MRN: [id number]  HPI Comments: N debridement L 10 toenails D and O long term C enlongated, thickened, and uncomfortable toenails A diabetic pt, difficult to cut, painful T hx of home pedicures   Diabetes      Review of Systems  Gastrointestinal:       Hospitalized for ulcers and cancer of the stomach.  Hematological:       Currently leukemia - Sprycel oral treatment  All other systems reviewed and are negative.      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

## 2014-09-03 NOTE — Telephone Encounter (Signed)
lvm for [facility] to return call. This is in reference to medication instructions for CT scan.

## 2014-09-04 ENCOUNTER — Telehealth: Payer: Self-pay | Admitting: *Deleted

## 2014-09-04 NOTE — Telephone Encounter (Signed)
Informed pt to hold losartan and metformin on 10/21, 10/22 and 10/23 for CT scan on 10/22.  Pt verbalized understanding and states his wife already gave him the message.

## 2014-09-05 ENCOUNTER — Encounter (HOSPITAL_COMMUNITY): Payer: Self-pay | Admitting: Pharmacy Technician

## 2014-09-06 ENCOUNTER — Encounter (HOSPITAL_COMMUNITY): Payer: Self-pay

## 2014-09-06 ENCOUNTER — Ambulatory Visit (HOSPITAL_COMMUNITY)
Admission: RE | Admit: 2014-09-06 | Discharge: 2014-09-06 | Disposition: A | Discharge status: Home / Self Care | Payer: Medicare Other | Source: Ambulatory Visit | Attending: Hematology and Oncology | Admitting: Hematology and Oncology

## 2014-09-06 DIAGNOSIS — Z9221 Personal history of antineoplastic chemotherapy: Secondary | ICD-10-CM | POA: Insufficient documentation

## 2014-09-06 DIAGNOSIS — R634 Abnormal weight loss: Secondary | ICD-10-CM | POA: Diagnosis present

## 2014-09-06 DIAGNOSIS — I7090 Unspecified atherosclerosis: Secondary | ICD-10-CM | POA: Diagnosis not present

## 2014-09-06 DIAGNOSIS — Z923 Personal history of irradiation: Secondary | ICD-10-CM | POA: Insufficient documentation

## 2014-09-06 DIAGNOSIS — R110 Nausea: Secondary | ICD-10-CM | POA: Insufficient documentation

## 2014-09-06 DIAGNOSIS — J8410 Pulmonary fibrosis, unspecified: Secondary | ICD-10-CM | POA: Insufficient documentation

## 2014-09-06 DIAGNOSIS — N281 Cyst of kidney, acquired: Secondary | ICD-10-CM | POA: Diagnosis not present

## 2014-09-06 DIAGNOSIS — C250 Malignant neoplasm of head of pancreas: Secondary | ICD-10-CM | POA: Insufficient documentation

## 2014-09-06 DIAGNOSIS — Z9049 Acquired absence of other specified parts of digestive tract: Secondary | ICD-10-CM | POA: Insufficient documentation

## 2014-09-06 DIAGNOSIS — R911 Solitary pulmonary nodule: Secondary | ICD-10-CM | POA: Insufficient documentation

## 2014-09-06 DIAGNOSIS — I2510 Atherosclerotic heart disease of native coronary artery without angina pectoris: Secondary | ICD-10-CM | POA: Insufficient documentation

## 2014-09-06 DIAGNOSIS — R109 Unspecified abdominal pain: Secondary | ICD-10-CM | POA: Insufficient documentation

## 2014-09-06 MED ORDER — IOHEXOL 300 MG/ML  SOLN
100.0000 mL | Freq: Once | INTRAMUSCULAR | Status: AC | PRN
  Administered 2014-09-06: 100 mL via INTRAVENOUS

## 2014-09-10 ENCOUNTER — Inpatient Hospital Stay (HOSPITAL_COMMUNITY)
Admission: RE | Admit: 2014-09-10 | Discharge: 2014-09-10 | Disposition: A | Discharge status: Home / Self Care | Payer: Medicare Other | Source: Ambulatory Visit

## 2014-09-11 ENCOUNTER — Encounter: Payer: Self-pay | Admitting: Hematology and Oncology

## 2014-09-11 ENCOUNTER — Ambulatory Visit (HOSPITAL_BASED_OUTPATIENT_CLINIC_OR_DEPARTMENT_OTHER): Payer: Medicare Other | Admitting: Hematology and Oncology

## 2014-09-11 VITALS — BP 129/67 | HR 82 | Temp 98.0°F | Resp 18 | Ht 74.0 in | Wt 221.2 lb

## 2014-09-11 DIAGNOSIS — R911 Solitary pulmonary nodule: Secondary | ICD-10-CM

## 2014-09-11 DIAGNOSIS — D630 Anemia in neoplastic disease: Secondary | ICD-10-CM

## 2014-09-11 DIAGNOSIS — C250 Malignant neoplasm of head of pancreas: Secondary | ICD-10-CM

## 2014-09-11 NOTE — Assessment & Plan Note (Signed)
This is likely due to recent treatment. The patient denies recent history of bleeding such as epistaxis, hematuria or hematochezia. [name] I will observe for now.    

## 2014-09-11 NOTE — Assessment & Plan Note (Signed)
I have revealed numerous scans with the patient and felt that the lower lung nodule is likely related to metastatic cancer. For that reason, surgery is canceled. It is too small to biopsy and PET CT scan is unlikely to be beneficial. I recommend repeat imaging study in 6 weeks and he agreed.

## 2014-09-11 NOTE — Assessment & Plan Note (Signed)
Unfortunately, imaging studies suspicious for metastatic cancer. Surgery was canceled. The lung nodule is too small to biopsy. The patient is still recovering from recent side effects from treatment. He is not ready to start palliative treatment yet. I recommend repeat imaging study in 6 weeks and to review the scan. If the lung nodule continues to enlarge, it would prove that the patient has metastatic cancer. I would like him to attend chemotherapy education class for second line treatment with FOLFOX in the future. We discussed prognosis is generally very poor.

## 2014-09-11 NOTE — Progress Notes (Signed)
[facility] OFFICE PROGRESS NOTE  Patient Care Team: [name], MD as PCP - General (Internal Medicine) Evan Comings, MD as Referring Physician (Specialist) Provider Not In System [name], MD as Consulting Physician (Hematology and Oncology)  SUMMARY OF ONCOLOGIC HISTORY: Oncology History   Pancreatic cancer   Primary site: Pancreas   Staging method: AJCC 7th Edition   Clinical: Stage IIA (T3, N0, M0) signed by [name], MD on 06/13/2014  4:37 PM   Summary: Stage IIA (T3, N0, M0)       Malignant neoplasm of head of pancreas   06/05/2014 Imaging  MRI abdomen showed 2.2 cm lesion in the pancreatic head obstructing the common bile duct and the pancreatic duct is highly concerning for pancreatic adenocarcinoma.  There is moderate intra and extrahepatic biliary duct dilatation.    06/05/2014 Tumor Marker CA 19-9 is elevated at 1234   06/06/2014 Imaging Ct scan of chest showed 2 lung nodules, likely benign   06/07/2014 Procedure He had ERCP and stent placement   06/07/2014 Procedure He had endoscopic ultrasound with  fine needle aspiration.   06/07/2014 Pathology Results Accession: [id number] FNA is positive for pancreatic adenoca   06/14/2014 Surgery He has port placement   06/25/2014 - 08/01/2014 Chemotherapy He received neoadjuvant chemotherapy with Gemzar   06/25/2014 - 08/06/2014 Radiation Therapy he received neoadjuvant radiation: 50.4 Gy   08/13/2014 - 08/23/2014 Hospital Admission The patient was admitted to the hospital related to uncontrolled nausea, vomiting and severe epigastric pain and was found to have significant gastric ulcer, resolved with conservative management and total parenteral nutrition   09/06/2014 Imaging Repeat CT scan show enlarging lung nodule, suspicious for early metastatic disease.    INTERVAL HISTORY: Please see below for problem oriented charting. He returns today to review test results. In the meantime, he feels well. His wife noted one  episode of confusion this morning. His blood sugar starts to run high  REVIEW OF SYSTEMS:   Constitutional: Denies fevers, chills or abnormal weight loss Eyes: Denies blurriness of vision Ears, nose, mouth, throat, and face: Denies mucositis or sore throat Respiratory: Denies cough, dyspnea or wheezes Cardiovascular: Denies palpitation, chest discomfort or lower extremity swelling Gastrointestinal:  Denies nausea, heartburn or change in bowel habits Skin: Denies abnormal skin rashes Lymphatics: Denies new lymphadenopathy or easy bruising Neurological:Denies numbness, tingling or new weaknesses Behavioral/Psych: Mood is stable, no new changes  All other systems were reviewed with the patient and are negative.  I have reviewed the past medical history, past surgical history, social history and family history with the patient and they are unchanged from previous note.  ALLERGIES:  is allergic to ace inhibitors and ciprofloxacin.  MEDICATIONS:  Current Outpatient Prescriptions  Medication Sig Dispense Refill  . aspirin EC 81 MG tablet Take 81 mg by mouth daily.      . B Complex-C (SUPER B COMPLEX/VITAMIN C PO) Take 1 tablet by mouth daily.       . cholecalciferol (VITAMIN D) 1000 UNITS tablet Take 1,000 Units by mouth daily.      . dasatinib (SPRYCEL) 50 MG tablet Take 50 mg by mouth at bedtime.      . diphenhydrAMINE (BENADRYL) 25 mg capsule Take 25-50 mg by mouth every 6 (six) hours as needed for itching.       . hydrocortisone cream 1 % Apply 1 application topically as needed for itching.      Marland Kitchen HYDROmorphone (DILAUDID) 4 MG tablet Take 4 mg by  mouth every 4 (four) hours as needed for severe pain (pain).      . insulin glargine (LANTUS) 100 UNIT/ML injection Inject 22 Units into the skin at bedtime.      Marland Kitchen lactulose (CHRONULAC) 10 GM/15ML solution Take 10 g by mouth 2 (two) times daily as needed for moderate constipation.       Marland Kitchen losartan (COZAAR) 100 MG tablet Take 100 mg by mouth  every morning.       . metFORMIN (GLUCOPHAGE) 1000 MG tablet Take 1,000 mg by mouth 2 (two) times daily with a meal.      . morphine (MS CONTIN) 30 MG 12 hr tablet Take 30 mg by mouth 2 (two) times daily as needed for pain.       . pantoprazole (PROTONIX) 40 MG tablet Take 1 tablet (40 mg total) by mouth daily.  60 tablet  6  . promethazine (PHENERGAN) 25 MG tablet Take 25 mg by mouth every 6 (six) hours as needed for nausea or vomiting (nausea).      . saxagliptin HCl (ONGLYZA) 5 MG TABS tablet Take 5 mg by mouth daily.      . sennosides-docusate sodium (SENOKOT-S) 8.6-50 MG tablet Take 2 tablets by mouth daily.       . simvastatin (ZOCOR) 20 MG tablet Take 20 mg by mouth every evening.      . vitamin B-12 (CYANOCOBALAMIN) 1000 MCG tablet Take 1,000 mcg by mouth daily.       No current facility-administered medications for this visit.   Facility-Administered Medications Ordered in Other Visits  Medication Dose Route Frequency Provider Last Rate Last Dose  . heparin lock flush 100 unit/mL  500 Units Intravenous Once [name], MD      . sodium chloride 0.9 % injection 10 mL  10 mL Intravenous PRN [name], MD        PHYSICAL EXAMINATION: ECOG PERFORMANCE STATUS: 0 - Asymptomatic  Filed Vitals:   09/11/14 0936  BP: 129/67  Pulse: 82  Temp: 98 F (36.7 C)  Resp: 18   Filed Weights   09/11/14 0936  Weight: 221 lb 3.2 oz (100.336 kg)    GENERAL:alert, no distress and comfortable SKIN: skin color, texture, turgor are normal, no rashes or significant lesions EYES: normal, Conjunctiva are pink and non-injected, sclera clear OROPHARYNX:no exudate, no erythema and lips, buccal mucosa, and tongue normal  NECK: supple, thyroid normal size, non-tender, without nodularity LYMPH:  no palpable lymphadenopathy in the cervical, axillary or inguinal LUNGS: clear to auscultation and percussion with normal breathing effort HEART: regular rate & rhythm and no murmurs and no lower extremity  edema ABDOMEN:abdomen soft, non-tender and normal bowel sounds Musculoskeletal:no cyanosis of digits and no clubbing  NEURO: alert & oriented x 3 with fluent speech, no focal motor/sensory deficits  LABORATORY DATA:  I have reviewed the data as listed    Component Value Date/Time   NA 141 08/31/2014 0854   NA 139 08/23/2014 0422   K 4.8 08/31/2014 0854   K 4.1 08/23/2014 0422   CL 102 08/23/2014 0422   CL 107 04/27/2013 0926   CO2 26 08/31/2014 0854   CO2 28 08/23/2014 0422   GLUCOSE 172* 08/31/2014 0854   GLUCOSE 190* 08/23/2014 0422   GLUCOSE 212* 04/27/2013 0926   BUN 18.3 08/31/2014 0854   BUN 13 08/23/2014 0422   CREATININE 1.2 08/31/2014 0854   CREATININE 1.03 08/23/2014 0422   CALCIUM 10.3 08/31/2014 0854   CALCIUM 9.5 08/23/2014  0422   PROT 6.7 08/31/2014 0854   PROT 5.7* 08/23/2014 0422   ALBUMIN 3.3* 08/31/2014 0854   ALBUMIN 2.7* 08/23/2014 0422   AST 21 08/31/2014 0854   AST 20 08/23/2014 0422   ALT 19 08/31/2014 0854   ALT 16 08/23/2014 0422   ALKPHOS 190* 08/31/2014 0854   ALKPHOS 187* 08/23/2014 0422   BILITOT 0.63 08/31/2014 0854   BILITOT 0.4 08/23/2014 0422   GFRNONAA 71* 08/23/2014 0422   GFRAA 82* 08/23/2014 0422    No results found for this basename: SPEP, UPEP,  kappa and lambda light chains    Lab Results  Component Value Date   WBC 8.5 08/31/2014   NEUTROABS 5.5 08/31/2014   HGB 11.9* 08/31/2014   HCT 36.7* 08/31/2014   MCV 102.5* 08/31/2014   PLT 230 08/31/2014      Chemistry      Component Value Date/Time   NA 141 08/31/2014 0854   NA 139 08/23/2014 0422   K 4.8 08/31/2014 0854   K 4.1 08/23/2014 0422   CL 102 08/23/2014 0422   CL 107 04/27/2013 0926   CO2 26 08/31/2014 0854   CO2 28 08/23/2014 0422   BUN 18.3 08/31/2014 0854   BUN 13 08/23/2014 0422   CREATININE 1.2 08/31/2014 0854   CREATININE 1.03 08/23/2014 0422      Component Value Date/Time   CALCIUM 10.3 08/31/2014 0854   CALCIUM 9.5 08/23/2014 0422   ALKPHOS 190* 08/31/2014 0854    ALKPHOS 187* 08/23/2014 0422   AST 21 08/31/2014 0854   AST 20 08/23/2014 0422   ALT 19 08/31/2014 0854   ALT 16 08/23/2014 0422   BILITOT 0.63 08/31/2014 0854   BILITOT 0.4 08/23/2014 0422     I reviewed all his last 3 CT scans with him and family ASSESSMENT & PLAN:  Malignant neoplasm of head of pancreas Unfortunately, imaging studies suspicious for metastatic cancer. Surgery was canceled. The lung nodule is too small to biopsy. The patient is still recovering from recent side effects from treatment. He is not ready to start palliative treatment yet. I recommend repeat imaging study in 6 weeks and to review the scan. If the lung nodule continues to enlarge, it would prove that the patient has metastatic cancer. I would like him to attend chemotherapy education class for second line treatment with FOLFOX in the future. We discussed prognosis is generally very poor.  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 The patient denies recent history of bleeding such as epistaxis, hematuria or hematochezia. [name] I will observe for now.     Lung nodule I have revealed numerous scans with the patient and felt that the lower lung nodule is likely related to metastatic cancer. For that reason, surgery is canceled. It is too small to biopsy and PET CT scan is unlikely to be beneficial. I recommend repeat imaging study in 6 weeks and he agreed.   Orders Placed This Encounter  Procedures  . CT Chest W Contrast    Standing Status: Future     Number of Occurrences:      Standing Expiration Date: 11/11/2015    Order Specific Question:  Reason for Exam (SYMPTOM  OR DIAGNOSIS REQUIRED)    Answer:  staging metastatic pancreatic ca    Order Specific Question:  Preferred imaging location?    Answer:  [facility]  . CT Abdomen Pelvis W Contrast    Standing Status: Future  Number of Occurrences:      Standing Expiration Date: 12/12/2015     Order Specific Question:  Reason for Exam (SYMPTOM  OR DIAGNOSIS REQUIRED)    Answer:  staging metastatic pancreatic ca    Order Specific Question:  Preferred imaging location?    Answer:  [facility] antigen 19-9    Standing Status: Future     Number of Occurrences:      Standing Expiration Date: 10/16/2015   All questions were answered. The patient knows to call the clinic with any problems, questions or concerns. No barriers to learning was detected. I spent 30 minutes counseling the patient face to face. The total time spent in the appointment was 40 minutes and more than 50% was on counseling and review of test results     [facility], [place], MD 09/11/2014 4:53 PM

## 2014-09-12 ENCOUNTER — Telehealth: Payer: Self-pay | Admitting: *Deleted

## 2014-09-12 ENCOUNTER — Telehealth: Payer: Self-pay | Admitting: Hematology and Oncology

## 2014-09-12 NOTE — Telephone Encounter (Signed)
Wife asking for expedited referral to [facility] or to [place], whichever Dr. Alvy Bimler feels is best.

## 2014-09-12 NOTE — Telephone Encounter (Signed)
Per staff message and POF I have scheduled appts. Advised scheduler of appts. JMW  

## 2014-09-12 NOTE — Telephone Encounter (Signed)
Please refer him to [facility].

## 2014-09-12 NOTE — Telephone Encounter (Signed)
Request sent to [facility]. In HIM for referral.

## 2014-09-12 NOTE — Telephone Encounter (Signed)
Confirm appt d/t for Dec. Mailed cal. °

## 2014-09-12 NOTE — Telephone Encounter (Signed)
Informed wife of referral will be made to [facility]. She verbalized understanding.

## 2014-09-13 ENCOUNTER — Telehealth: Payer: Self-pay | Admitting: Hematology and Oncology

## 2014-09-13 ENCOUNTER — Ambulatory Visit: Payer: Medicare Other | Admitting: Radiation Oncology

## 2014-09-13 NOTE — Telephone Encounter (Signed)
PT APPT. WITH DR.BLOBE @ [facility] IS 09/18/14@11 :00. MEDICAL RECORDS FAXED.  SLIDES AND SCANS WILL BE FEDEX'ED. LEFT PT VM IN REF. TO APPT.

## 2014-09-17 ENCOUNTER — Telehealth: Payer: Self-pay | Admitting: *Deleted

## 2014-09-17 ENCOUNTER — Encounter: Payer: Self-pay | Admitting: *Deleted

## 2014-09-17 NOTE — Telephone Encounter (Signed)
Orders signed by Dr Alvy Bimler for lab draw faxed to [facility] @ [phone].

## 2014-09-18 ENCOUNTER — Encounter (HOSPITAL_COMMUNITY): Admission: RE | Payer: Self-pay | Source: Ambulatory Visit

## 2014-09-18 ENCOUNTER — Inpatient Hospital Stay (HOSPITAL_COMMUNITY): Admission: RE | Admit: 2014-09-18 | Payer: Medicare Other | Source: Ambulatory Visit | Admitting: General Surgery

## 2014-09-18 SURGERY — WHIPPLE PROCEDURE
Anesthesia: General

## 2014-09-20 ENCOUNTER — Ambulatory Visit: Payer: Medicare Other | Admitting: Radiation Oncology

## 2014-09-24 ENCOUNTER — Encounter: Payer: Self-pay | Admitting: *Deleted

## 2014-09-24 NOTE — Progress Notes (Signed)
Notified by [place] they will ship Sprycel 50 mg tablets for delivery to pt on 09/28/14.

## 2014-09-26 ENCOUNTER — Other Ambulatory Visit: Payer: Self-pay | Admitting: Hematology and Oncology

## 2014-09-26 ENCOUNTER — Telehealth: Payer: Self-pay | Admitting: *Deleted

## 2014-09-26 NOTE — Telephone Encounter (Signed)
Wife asks if pt is supposed to hold any medications prior to his CT scan on 12/03?

## 2014-09-26 NOTE — Telephone Encounter (Signed)
Cozaar and metformin, the day before, day of and day after CT scan

## 2014-09-27 NOTE — Telephone Encounter (Signed)
Left VM for pt/wife instructing to hold the Cozaar and metformin day before, day of and day after CT scan.

## 2014-10-17 ENCOUNTER — Telehealth: Payer: Self-pay | Admitting: *Deleted

## 2014-10-17 NOTE — Telephone Encounter (Signed)
No additional note

## 2014-10-17 NOTE — Telephone Encounter (Signed)
Lab moved to 8:30 am tomorrow prior to CT scan.  Wife notified.  She says pt to meet w/ Education Nurse at 8:30 am.  Instructed wife to bring pt a little early and hopefully he can get labs done before 8:30 am.  Notified Ursula Beath, education RN of lab appt.

## 2014-10-18 ENCOUNTER — Encounter (HOSPITAL_COMMUNITY): Payer: Self-pay

## 2014-10-18 ENCOUNTER — Other Ambulatory Visit (HOSPITAL_BASED_OUTPATIENT_CLINIC_OR_DEPARTMENT_OTHER): Payer: Medicare Other

## 2014-10-18 ENCOUNTER — Other Ambulatory Visit: Payer: Medicare Other

## 2014-10-18 ENCOUNTER — Ambulatory Visit (HOSPITAL_COMMUNITY)
Admission: RE | Admit: 2014-10-18 | Discharge: 2014-10-18 | Disposition: A | Discharge status: Home / Self Care | Payer: Medicare Other | Source: Ambulatory Visit | Attending: Hematology and Oncology | Admitting: Hematology and Oncology

## 2014-10-18 ENCOUNTER — Encounter: Payer: Self-pay | Admitting: *Deleted

## 2014-10-18 DIAGNOSIS — D630 Anemia in neoplastic disease: Secondary | ICD-10-CM

## 2014-10-18 DIAGNOSIS — C250 Malignant neoplasm of head of pancreas: Secondary | ICD-10-CM

## 2014-10-18 DIAGNOSIS — Z856 Personal history of leukemia: Secondary | ICD-10-CM | POA: Diagnosis not present

## 2014-10-18 DIAGNOSIS — Z79899 Other long term (current) drug therapy: Secondary | ICD-10-CM | POA: Insufficient documentation

## 2014-10-18 LAB — CBC WITH DIFFERENTIAL/PLATELET
BASO%: 1.1 % (ref 0.0–2.0)
Basophils Absolute: 0.1 10*3/uL (ref 0.0–0.1)
EOS ABS: 0.2 10*3/uL (ref 0.0–0.5)
EOS%: 3.7 % (ref 0.0–7.0)
HCT: 40.6 % (ref 38.4–49.9)
HEMOGLOBIN: 13.4 g/dL (ref 13.0–17.1)
LYMPH%: 19.2 % (ref 14.0–49.0)
MCH: 31.6 pg (ref 27.2–33.4)
MCHC: 32.9 g/dL (ref 32.0–36.0)
MCV: 96.2 fL (ref 79.3–98.0)
MONO#: 0.7 10*3/uL (ref 0.1–0.9)
MONO%: 12.8 % (ref 0.0–14.0)
NEUT%: 63.2 % (ref 39.0–75.0)
NEUTROS ABS: 3.4 10*3/uL (ref 1.5–6.5)
PLATELETS: 182 10*3/uL (ref 140–400)
RBC: 4.22 10*6/uL (ref 4.20–5.82)
RDW: 14.2 % (ref 11.0–14.6)
WBC: 5.4 10*3/uL (ref 4.0–10.3)
lymph#: 1 10*3/uL (ref 0.9–3.3)

## 2014-10-18 LAB — COMPREHENSIVE METABOLIC PANEL (CC13)
ALBUMIN: 3.7 g/dL (ref 3.5–5.0)
ALT: 24 U/L (ref 0–55)
ANION GAP: 10 meq/L (ref 3–11)
AST: 32 U/L (ref 5–34)
Alkaline Phosphatase: 139 U/L (ref 40–150)
BUN: 11.6 mg/dL (ref 7.0–26.0)
CALCIUM: 10.9 mg/dL — AB (ref 8.4–10.4)
CHLORIDE: 104 meq/L (ref 98–109)
CO2: 29 meq/L (ref 22–29)
Creatinine: 1.2 mg/dL (ref 0.7–1.3)
EGFR: 63 mL/min/{1.73_m2} — AB (ref >90)
Glucose: 155 mg/dl — ABNORMAL HIGH (ref 70–140)
POTASSIUM: 4.4 meq/L (ref 3.5–5.1)
SODIUM: 142 meq/L (ref 136–145)
TOTAL PROTEIN: 7 g/dL (ref 6.4–8.3)
Total Bilirubin: 0.5 mg/dL (ref 0.20–1.20)

## 2014-10-18 LAB — CANCER ANTIGEN 19-9: CA 19-9: 1744.8 U/mL — ABNORMAL HIGH (ref <35.0)

## 2014-10-18 MED ORDER — IOHEXOL 300 MG/ML  SOLN
100.0000 mL | Freq: Once | INTRAMUSCULAR | Status: AC | PRN
  Administered 2014-10-18: 100 mL via INTRAVENOUS

## 2014-10-19 ENCOUNTER — Telehealth: Payer: Self-pay | Admitting: Hematology and Oncology

## 2014-10-19 ENCOUNTER — Encounter: Payer: Self-pay | Admitting: Hematology and Oncology

## 2014-10-19 ENCOUNTER — Ambulatory Visit (HOSPITAL_BASED_OUTPATIENT_CLINIC_OR_DEPARTMENT_OTHER): Payer: Medicare Other | Admitting: Hematology and Oncology

## 2014-10-19 VITALS — BP 133/65 | HR 63 | Temp 97.9°F | Resp 18 | Ht 74.0 in | Wt 224.2 lb

## 2014-10-19 DIAGNOSIS — C9211 Chronic myeloid leukemia, BCR/ABL-positive, in remission: Secondary | ICD-10-CM

## 2014-10-19 DIAGNOSIS — E1140 Type 2 diabetes mellitus with diabetic neuropathy, unspecified: Secondary | ICD-10-CM

## 2014-10-19 DIAGNOSIS — K259 Gastric ulcer, unspecified as acute or chronic, without hemorrhage or perforation: Secondary | ICD-10-CM

## 2014-10-19 DIAGNOSIS — C250 Malignant neoplasm of head of pancreas: Secondary | ICD-10-CM

## 2014-10-19 NOTE — Telephone Encounter (Signed)
Gave avs & cal for Dec. °

## 2014-10-20 NOTE — Assessment & Plan Note (Signed)
He will continue on dasatinib for now. His CBC were within normal limits 

## 2014-10-20 NOTE — Assessment & Plan Note (Signed)
He'll continue on high-dose proton pump inhibitor.

## 2014-10-20 NOTE — Assessment & Plan Note (Signed)
We discussed the role of chemotherapy. The intent is for palliative.  We discussed some of the risks, benefits, side-effects of 5FU, oxaliplatin and leucovorin. Some of the short term side-effects included, though not limited to, including weight loss, life threatening infections, risk of allergic reactions, need for transfusions of blood products, nausea, vomiting, change in bowel habits, loss of hair, admission to hospital for various reasons, and risks of death.   Long term side-effects are also discussed including risks of infertility, permanent damage to nerve function, hearing loss, chronic fatigue, kidney damage with possibility needing hemodialysis, and rare secondary malignancy including bone marrow disorders.  The patient is aware that the response rates discussed earlier is not guaranteed.  After a long discussion, patient made an informed decision to proceed with the prescribed plan of care. I warned him about risk of severe hyperglycemia with the treatment. Due to baseline peripheral neuropathy, I would reduce the dose of oxaliplatin by 20%.

## 2014-10-20 NOTE — Progress Notes (Signed)
[facility] OFFICE PROGRESS NOTE  Patient Care Team: [name], MD as PCP - General (Internal Medicine) [name], MD as Referring Physician (Specialist) Provider Not In System [name], MD as Consulting Physician (Hematology and Oncology)  SUMMARY OF ONCOLOGIC HISTORY: Oncology History   Pancreatic cancer   Primary site: Pancreas   Staging method: AJCC 7th Edition   Clinical: Stage IIA (T3, N0, M0) signed by [name], MD on 06/13/2014  4:37 PM   Summary: Stage IIA (T3, N0, M0)       Malignant neoplasm of head of pancreas   06/05/2014 Imaging  MRI abdomen showed 2.2 cm lesion in the pancreatic head obstructing the common bile duct and the pancreatic duct is highly concerning for pancreatic adenocarcinoma.  There is moderate intra and extrahepatic biliary duct dilatation.    06/05/2014 Tumor Marker CA 19-9 is elevated at 1234   06/06/2014 Imaging Ct scan of chest showed 2 lung nodules, likely benign   06/07/2014 Procedure He had ERCP and stent placement   06/07/2014 Procedure He had endoscopic ultrasound with  fine needle aspiration.   06/07/2014 Pathology Results Accession: [id number] FNA is positive for pancreatic adenoca   06/14/2014 Surgery He has port placement   06/25/2014 - 08/01/2014 Chemotherapy He received neoadjuvant chemotherapy with Gemzar   06/25/2014 - 08/06/2014 Radiation Therapy he received neoadjuvant radiation: 50.4 Gy   08/13/2014 - 08/23/2014 Hospital Admission The patient was admitted to the hospital related to uncontrolled nausea, vomiting and severe epigastric pain and was found to have significant gastric ulcer, resolved with conservative management and total parenteral nutrition   09/06/2014 Imaging Repeat CT scan show enlarging lung nodule, suspicious for early metastatic disease.   10/18/2014 Imaging CT scan of the chest, abdomen and pelvis show metastatic disease in the lung and liver.   10/18/2014 Tumor Marker CA-19-9 at 1745    INTERVAL  HISTORY: Please see below for problem oriented charting. He returns to review test results. In the meantime he feels fine. Denies further abdominal pain, nausea or vomiting.  REVIEW OF SYSTEMS:   Constitutional: Denies fevers, chills or abnormal weight loss Eyes: Denies blurriness of vision Ears, nose, mouth, throat, and face: Denies mucositis or sore throat Respiratory: Denies cough, dyspnea or wheezes Cardiovascular: Denies palpitation, chest discomfort or lower extremity swelling Gastrointestinal:  Denies nausea, heartburn or change in bowel habits Skin: Denies abnormal skin rashes Lymphatics: Denies new lymphadenopathy or easy bruising Neurological:Denies numbness, tingling or new weaknesses Behavioral/Psych: Mood is stable, no new changes  All other systems were reviewed with the patient and are negative.  I have reviewed the past medical history, past surgical history, social history and family history with the patient and they are unchanged from previous note.  ALLERGIES:  is allergic to ace inhibitors and ciprofloxacin.  MEDICATIONS:  Current Outpatient Prescriptions  Medication Sig Dispense Refill  . aspirin EC 81 MG tablet Take 81 mg by mouth daily.    . B Complex-C (SUPER B COMPLEX/VITAMIN C PO) Take 1 tablet by mouth daily.     . cholecalciferol (VITAMIN D) 1000 UNITS tablet Take 1,000 Units by mouth daily.    . dasatinib (SPRYCEL) 50 MG tablet Take 50 mg by mouth at bedtime.    . diphenhydrAMINE (BENADRYL) 25 mg capsule Take 25-50 mg by mouth every 6 (six) hours as needed for itching.     . hydrocortisone cream 1 % Apply 1 application topically as needed for itching.    Marland Kitchen HYDROmorphone (DILAUDID) 4  MG tablet Take 4 mg by mouth every 4 (four) hours as needed for severe pain (pain).    . insulin glargine (LANTUS) 100 UNIT/ML injection Inject 26 Units into the skin at bedtime.     Marland Kitchen lactulose (CHRONULAC) 10 GM/15ML solution Take 10 g by mouth 2 (two) times daily as needed  for moderate constipation.     Marland Kitchen losartan (COZAAR) 100 MG tablet Take 100 mg by mouth every morning.     . metFORMIN (GLUCOPHAGE) 1000 MG tablet Take 1,000 mg by mouth 2 (two) times daily with a meal.    . pantoprazole (PROTONIX) 40 MG tablet Take 1 tablet (40 mg total) by mouth daily. 60 tablet 6  . promethazine (PHENERGAN) 25 MG tablet Take 25 mg by mouth every 6 (six) hours as needed for nausea or vomiting (nausea).    . saxagliptin HCl (ONGLYZA) 5 MG TABS tablet Take 5 mg by mouth daily.    . sennosides-docusate sodium (SENOKOT-S) 8.6-50 MG tablet Take 2 tablets by mouth daily.     . simvastatin (ZOCOR) 20 MG tablet Take 20 mg by mouth every evening.    . vitamin B-12 (CYANOCOBALAMIN) 1000 MCG tablet Take 1,000 mcg by mouth daily.     No current facility-administered medications for this visit.   Facility-Administered Medications Ordered in Other Visits  Medication Dose Route Frequency Provider Last Rate Last Dose  . heparin lock flush 100 unit/mL  500 Units Intravenous Once [name], MD      . sodium chloride 0.9 % injection 10 mL  10 mL Intravenous PRN [name], MD        PHYSICAL EXAMINATION: ECOG PERFORMANCE STATUS: 0 - Asymptomatic  Filed Vitals:   10/19/14 1208  BP: 133/65  Pulse: 63  Temp: 97.9 F (36.6 C)  Resp: 18   Filed Weights   10/19/14 1208  Weight: 224 lb 3.2 oz (101.696 kg)    GENERAL:alert, no distress and comfortable SKIN: skin color, texture, turgor are normal, no rashes or significant lesions EYES: normal, Conjunctiva are pink and non-injected, sclera clear OROPHARYNX:no exudate, no erythema and lips, buccal mucosa, and tongue normal  NECK: supple, thyroid normal size, non-tender, without nodularity LYMPH:  no palpable lymphadenopathy in the cervical, axillary or inguinal LUNGS: clear to auscultation and percussion with normal breathing effort HEART: regular rate & rhythm and no murmurs and no lower extremity edema ABDOMEN:abdomen soft, non-tender  and normal bowel sounds Musculoskeletal:no cyanosis of digits and no clubbing  NEURO: alert & oriented x 3 with fluent speech, no focal motor/sensory deficits  LABORATORY DATA:  I have reviewed the data as listed    Component Value Date/Time   NA 142 10/18/2014 0806   NA 139 08/23/2014 0422   K 4.4 10/18/2014 0806   K 4.1 08/23/2014 0422   CL 102 08/23/2014 0422   CL 107 04/27/2013 0926   CO2 29 10/18/2014 0806   CO2 28 08/23/2014 0422   GLUCOSE 155* 10/18/2014 0806   GLUCOSE 190* 08/23/2014 0422   GLUCOSE 212* 04/27/2013 0926   BUN 11.6 10/18/2014 0806   BUN 13 08/23/2014 0422   CREATININE 1.2 10/18/2014 0806   CREATININE 1.03 08/23/2014 0422   CALCIUM 10.9* 10/18/2014 0806   CALCIUM 9.5 08/23/2014 0422   PROT 7.0 10/18/2014 0806   PROT 5.7* 08/23/2014 0422   ALBUMIN 3.7 10/18/2014 0806   ALBUMIN 2.7* 08/23/2014 0422   AST 32 10/18/2014 0806   AST 20 08/23/2014 0422   ALT 24 10/18/2014 0806  ALT 16 08/23/2014 0422   ALKPHOS 139 10/18/2014 0806   ALKPHOS 187* 08/23/2014 0422   BILITOT 0.50 10/18/2014 0806   BILITOT 0.4 08/23/2014 0422   GFRNONAA 71* 08/23/2014 0422   GFRAA 82* 08/23/2014 0422    No results found for: SPEP, UPEP  Lab Results  Component Value Date   WBC 5.4 10/18/2014   NEUTROABS 3.4 10/18/2014   HGB 13.4 10/18/2014   HCT 40.6 10/18/2014   MCV 96.2 10/18/2014   PLT 182 10/18/2014      Chemistry      Component Value Date/Time   NA 142 10/18/2014 0806   NA 139 08/23/2014 0422   K 4.4 10/18/2014 0806   K 4.1 08/23/2014 0422   CL 102 08/23/2014 0422   CL 107 04/27/2013 0926   CO2 29 10/18/2014 0806   CO2 28 08/23/2014 0422   BUN 11.6 10/18/2014 0806   BUN 13 08/23/2014 0422   CREATININE 1.2 10/18/2014 0806   CREATININE 1.03 08/23/2014 0422      Component Value Date/Time   CALCIUM 10.9* 10/18/2014 0806   CALCIUM 9.5 08/23/2014 0422   ALKPHOS 139 10/18/2014 0806   ALKPHOS 187* 08/23/2014 0422   AST 32 10/18/2014 0806   AST 20  08/23/2014 0422   ALT 24 10/18/2014 0806   ALT 16 08/23/2014 0422   BILITOT 0.50 10/18/2014 0806   BILITOT 0.4 08/23/2014 0422       RADIOGRAPHIC STUDIES: I reviewed the reports of the imaging study with him and his wife. I have personally reviewed the radiological images as listed and agreed with the findings in the report.  ASSESSMENT & PLAN:  Malignant neoplasm of head of pancreas We discussed the role of chemotherapy. The intent is for palliative.  We discussed some of the risks, benefits, side-effects of 5FU, oxaliplatin and leucovorin. Some of the short term side-effects included, though not limited to, including weight loss, life threatening infections, risk of allergic reactions, need for transfusions of blood products, nausea, vomiting, change in bowel habits, loss of hair, admission to hospital for various reasons, and risks of death.   Long term side-effects are also discussed including risks of infertility, permanent damage to nerve function, hearing loss, chronic fatigue, kidney damage with possibility needing hemodialysis, and rare secondary malignancy including bone marrow disorders.  The patient is aware that the response rates discussed earlier is not guaranteed.  After a long discussion, patient made an informed decision to proceed with the prescribed plan of care. I warned him about risk of severe hyperglycemia with the treatment. Due to baseline peripheral neuropathy, I would reduce the dose of oxaliplatin by 20%.  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 His CBC were within normal limits  Gastric ulcer He'll continue on high-dose proton pump inhibitor.  Diabetes mellitus with neuropathy Patient is warned about the risk of severe hyperglycemia and worsening peripheral neuropathy. We will monitor his blood sugar carefully and will order dose adjustment to oxaliplatin.   Orders Placed This Encounter  Procedures  . Cancer antigen 19-9    Standing  Status: Standing     Number of Occurrences: 9     Standing Expiration Date: 10/20/2015   All questions were answered. The patient knows to call the clinic with any problems, questions or concerns. No barriers to learning was detected. I spent 40 minutes counseling the patient face to face. The total time spent in the appointment was 60 minutes and more than 50% was on counseling  and review of test results     [facility], [place], MD 10/20/2014 8:26 PM

## 2014-10-20 NOTE — Assessment & Plan Note (Signed)
Patient is warned about the risk of severe hyperglycemia and worsening peripheral neuropathy. We will monitor his blood sugar carefully and will order dose adjustment to oxaliplatin. 

## 2014-10-22 ENCOUNTER — Ambulatory Visit (HOSPITAL_BASED_OUTPATIENT_CLINIC_OR_DEPARTMENT_OTHER): Payer: Medicare Other

## 2014-10-22 ENCOUNTER — Other Ambulatory Visit: Payer: Self-pay | Admitting: *Deleted

## 2014-10-22 DIAGNOSIS — C250 Malignant neoplasm of head of pancreas: Secondary | ICD-10-CM

## 2014-10-22 DIAGNOSIS — Z5111 Encounter for antineoplastic chemotherapy: Secondary | ICD-10-CM

## 2014-10-22 MED ORDER — LEUCOVORIN CALCIUM INJECTION 350 MG
400.0000 mg/m2 | Freq: Once | INTRAVENOUS | Status: AC
  Administered 2014-10-22: 920 mg via INTRAVENOUS
  Filled 2014-10-22: qty 46

## 2014-10-22 MED ORDER — DEXAMETHASONE SODIUM PHOSPHATE 10 MG/ML IJ SOLN
10.0000 mg | Freq: Once | INTRAMUSCULAR | Status: AC
  Administered 2014-10-22: 10 mg via INTRAVENOUS

## 2014-10-22 MED ORDER — DEXAMETHASONE SODIUM PHOSPHATE 10 MG/ML IJ SOLN
INTRAMUSCULAR | Status: AC
  Filled 2014-10-22: qty 1

## 2014-10-22 MED ORDER — ONDANSETRON 8 MG/NS 50 ML IVPB
INTRAVENOUS | Status: AC
  Filled 2014-10-22: qty 8

## 2014-10-22 MED ORDER — OXALIPLATIN CHEMO INJECTION 100 MG/20ML
68.0000 mg/m2 | Freq: Once | INTRAVENOUS | Status: AC
  Administered 2014-10-22: 155 mg via INTRAVENOUS
  Filled 2014-10-22: qty 31

## 2014-10-22 MED ORDER — SODIUM CHLORIDE 0.9 % IV SOLN
2400.0000 mg/m2 | INTRAVENOUS | Status: DC
  Administered 2014-10-22: 5500 mg via INTRAVENOUS
  Filled 2014-10-22: qty 110

## 2014-10-22 MED ORDER — ONDANSETRON 8 MG/50ML IVPB (CHCC)
8.0000 mg | Freq: Once | INTRAVENOUS | Status: AC
  Administered 2014-10-22: 8 mg via INTRAVENOUS

## 2014-10-22 MED ORDER — DEXTROSE 5 % IV SOLN
Freq: Once | INTRAVENOUS | Status: AC
  Administered 2014-10-22: 11:00:00 via INTRAVENOUS

## 2014-10-22 NOTE — Patient Instructions (Addendum)
[facility] Discharge Instructions for Patients Receiving Chemotherapy  Today you received the following chemotherapy agents oxaliplatin/leucovorin/fluorouracil    To help prevent nausea and vomiting after your treatment, we encourage you to take your nausea medication as directed   If you develop nausea and vomiting that is not controlled by your nausea medication, call the clinic.   BELOW ARE SYMPTOMS THAT SHOULD BE REPORTED IMMEDIATELY:  *FEVER GREATER THAN 100.5 F  *CHILLS WITH OR WITHOUT FEVER  NAUSEA AND VOMITING THAT IS NOT CONTROLLED WITH YOUR NAUSEA MEDICATION  *UNUSUAL SHORTNESS OF BREATH  *UNUSUAL BRUISING OR BLEEDING  TENDERNESS IN MOUTH AND THROAT WITH OR WITHOUT PRESENCE OF ULCERS  *URINARY PROBLEMS  *BOWEL PROBLEMS  UNUSUAL RASH Items with * indicate a potential emergency and should be followed up as soon as possible.  Feel free to call the clinic you have any questions or concerns. The clinic phone number is [phone].  Oxaliplatin Injection What is this medicine? OXALIPLATIN (ox AL i PLA tin) is a chemotherapy drug. It targets fast dividing cells, like cancer cells, and causes these cells to die. This medicine is used to treat cancers of the colon and rectum, and many other cancers. This medicine may be used for other purposes; ask your health care provider or pharmacist if you have questions. COMMON BRAND NAME(S): Eloxatin What should I tell my health care provider before I take this medicine? They need to know if you have any of these conditions: -kidney disease -an unusual or allergic reaction to oxaliplatin, other chemotherapy, other medicines, foods, dyes, or preservatives -pregnant or trying to get pregnant -breast-feeding How should I use this medicine? This drug is given as an infusion into a vein. It is administered in a hospital or clinic by a specially trained health care professional. Talk to your pediatrician regarding the  use of this medicine in children. Special care may be needed. Overdosage: If you think you have taken too much of this medicine contact a poison control center or emergency room at once. NOTE: This medicine is only for you. Do not share this medicine with others. What if I miss a dose? It is important not to miss a dose. Call your doctor or health care professional if you are unable to keep an appointment. What may interact with this medicine? -medicines to increase blood counts like filgrastim, pegfilgrastim, sargramostim -probenecid -some antibiotics like amikacin, gentamicin, neomycin, polymyxin B, streptomycin, tobramycin -zalcitabine Talk to your doctor or health care professional before taking any of these medicines: -acetaminophen -aspirin -ibuprofen -ketoprofen -naproxen This list may not describe all possible interactions. Give your health care provider a list of all the medicines, herbs, non-prescription drugs, or dietary supplements you use. Also tell them if you smoke, drink alcohol, or use illegal drugs. Some items may interact with your medicine. What should I watch for while using this medicine? Your condition will be monitored carefully while you are receiving this medicine. You will need important blood work done while you are taking this medicine. This medicine can make you more sensitive to cold. Do not drink cold drinks or use ice. Cover exposed skin before coming in contact with cold temperatures or cold objects. When out in cold weather wear warm clothing and cover your mouth and nose to warm the air that goes into your lungs. Tell your doctor if you get sensitive to the cold. This drug may make you feel generally unwell. This is not uncommon, as chemotherapy can affect healthy cells  as well as cancer cells. Report any side effects. Continue your course of treatment even though you feel ill unless your doctor tells you to stop. In some cases, you may be given additional  medicines to help with side effects. Follow all directions for their use. Call your doctor or health care professional for advice if you get a fever, chills or sore throat, or other symptoms of a cold or flu. Do not treat yourself. This drug decreases your body's ability to fight infections. Try to avoid being around people who are sick. This medicine may increase your risk to bruise or bleed. Call your doctor or health care professional if you notice any unusual bleeding. Be careful brushing and flossing your teeth or using a toothpick because you may get an infection or bleed more easily. If you have any dental work done, tell your dentist you are receiving this medicine. Avoid taking products that contain aspirin, acetaminophen, ibuprofen, naproxen, or ketoprofen unless instructed by your doctor. These medicines may hide a fever. Do not become pregnant while taking this medicine. Women should inform their doctor if they wish to become pregnant or think they might be pregnant. There is a potential for serious side effects to an unborn child. Talk to your health care professional or pharmacist for more information. Do not breast-feed an infant while taking this medicine. Call your doctor or health care professional if you get diarrhea. Do not treat yourself. What side effects may I notice from receiving this medicine? Side effects that you should report to your doctor or health care professional as soon as possible: -allergic reactions like skin rash, itching or hives, swelling of the face, lips, or tongue -low blood counts - This drug may decrease the number of white blood cells, red blood cells and platelets. You may be at increased risk for infections and bleeding. -signs of infection - fever or chills, cough, sore throat, pain or difficulty passing urine -signs of decreased platelets or bleeding - bruising, pinpoint red spots on the skin, black, tarry stools, nosebleeds -signs of decreased red  blood cells - unusually weak or tired, fainting spells, lightheadedness -breathing problems -chest pain, pressure -cough -diarrhea -jaw tightness -mouth sores -nausea and vomiting -pain, swelling, redness or irritation at the injection site -pain, tingling, numbness in the hands or feet -problems with balance, talking, walking -redness, blistering, peeling or loosening of the skin, including inside the mouth -trouble passing urine or change in the amount of urine Side effects that usually do not require medical attention (report to your doctor or health care professional if they continue or are bothersome): -changes in vision -constipation -hair loss -loss of appetite -metallic taste in the mouth or changes in taste -stomach pain This list may not describe all possible side effects. Call your doctor for medical advice about side effects. You may report side effects to FDA at 1-800-FDA-1088. Where should I keep my medicine? This drug is given in a hospital or clinic and will not be stored at home. NOTE: This sheet is a summary. It may not cover all possible information. If you have questions about this medicine, talk to your doctor, pharmacist, or health care provider.  2015, [employer] Standard. (2008-05-29 17:22:47).  Leucovorin injection What is this medicine? LEUCOVORIN (loo koe VOR in) is used to prevent or treat the harmful effects of some medicines. This medicine is used to treat anemia caused by a low amount of folic acid in the body. It is also used with 5-fluorouracil (  5-FU) to treat colon cancer. This medicine may be used for other purposes; ask your health care provider or pharmacist if you have questions. What should I tell my health care provider before I take this medicine? They need to know if you have any of these conditions: -anemia from low levels of vitamin B-12 in the blood -an unusual or allergic reaction to leucovorin, folic acid, other medicines, foods,  dyes, or preservatives -pregnant or trying to get pregnant -breast-feeding How should I use this medicine? This medicine is for injection into a muscle or into a vein. It is given by a health care professional in a hospital or clinic setting. Talk to your pediatrician regarding the use of this medicine in children. Special care may be needed. Overdosage: If you think you have taken too much of this medicine contact a poison control center or emergency room at once. NOTE: This medicine is only for you. Do not share this medicine with others. What if I miss a dose? This does not apply. What may interact with this medicine? -capecitabine -fluorouracil -phenobarbital -phenytoin -primidone -trimethoprim-sulfamethoxazole This list may not describe all possible interactions. Give your health care provider a list of all the medicines, herbs, non-prescription drugs, or dietary supplements you use. Also tell them if you smoke, drink alcohol, or use illegal drugs. Some items may interact with your medicine. What should I watch for while using this medicine? Your condition will be monitored carefully while you are receiving this medicine. This medicine may increase the side effects of 5-fluorouracil, 5-FU. Tell your doctor or health care professional if you have diarrhea or mouth sores that do not get better or that get worse. What side effects may I notice from receiving this medicine? Side effects that you should report to your doctor or health care professional as soon as possible: -allergic reactions like skin rash, itching or hives, swelling of the face, lips, or tongue -breathing problems -fever, infection -mouth sores -unusual bleeding or bruising -unusually weak or tired Side effects that usually do not require medical attention (report to your doctor or health care professional if they continue or are bothersome): -constipation or diarrhea -loss of appetite -nausea, vomiting This list  may not describe all possible side effects. Call your doctor for medical advice about side effects. You may report side effects to FDA at 1-800-FDA-1088. Where should I keep my medicine? This drug is given in a hospital or clinic and will not be stored at home. NOTE: This sheet is a summary. It may not cover all possible information. If you have questions about this medicine, talk to your doctor, pharmacist, or health care provider.  2015, [employer]. (2008-05-08 16:50:29)  Fluorouracil, 5-FU injection What is this medicine? FLUOROURACIL, 5-FU (flure oh YOOR a sil) is a chemotherapy drug. It slows the growth of cancer cells. This medicine is used to treat many types of cancer like breast cancer, colon or rectal cancer, pancreatic cancer, and stomach cancer. This medicine may be used for other purposes; ask your health care provider or pharmacist if you have questions. COMMON BRAND NAME(S): Adrucil What should I tell my health care provider before I take this medicine? They need to know if you have any of these conditions: -blood disorders -dihydropyrimidine dehydrogenase (DPD) deficiency -infection (especially a virus infection such as chickenpox, cold sores, or herpes) -kidney disease -liver disease -malnourished, poor nutrition -recent or ongoing radiation therapy -an unusual or allergic reaction to fluorouracil, other chemotherapy, other medicines, foods, dyes, or preservatives -  pregnant or trying to get pregnant -breast-feeding How should I use this medicine? This drug is given as an infusion or injection into a vein. It is administered in a hospital or clinic by a specially trained health care professional. Talk to your pediatrician regarding the use of this medicine in children. Special care may be needed. Overdosage: If you think you have taken too much of this medicine contact a poison control center or emergency room at once. NOTE: This medicine is only for you. Do  not share this medicine with others. What if I miss a dose? It is important not to miss your dose. Call your doctor or health care professional if you are unable to keep an appointment. What may interact with this medicine? -allopurinol -cimetidine -dapsone -digoxin -hydroxyurea -leucovorin -levamisole -medicines for seizures like ethotoin, fosphenytoin, phenytoin -medicines to increase blood counts like filgrastim, pegfilgrastim, sargramostim -medicines that treat or prevent blood clots like warfarin, enoxaparin, and dalteparin -methotrexate -metronidazole -pyrimethamine -some other chemotherapy drugs like busulfan, cisplatin, estramustine, vinblastine -trimethoprim -trimetrexate -vaccines Talk to your doctor or health care professional before taking any of these medicines: -acetaminophen -aspirin -ibuprofen -ketoprofen -naproxen This list may not describe all possible interactions. Give your health care provider a list of all the medicines, herbs, non-prescription drugs, or dietary supplements you use. Also tell them if you smoke, drink alcohol, or use illegal drugs. Some items may interact with your medicine. What should I watch for while using this medicine? Visit your doctor for checks on your progress. This drug may make you feel generally unwell. This is not uncommon, as chemotherapy can affect healthy cells as well as cancer cells. Report any side effects. Continue your course of treatment even though you feel ill unless your doctor tells you to stop. In some cases, you may be given additional medicines to help with side effects. Follow all directions for their use. Call your doctor or health care professional for advice if you get a fever, chills or sore throat, or other symptoms of a cold or flu. Do not treat yourself. This drug decreases your body's ability to fight infections. Try to avoid being around people who are sick. This medicine may increase your risk to bruise or  bleed. Call your doctor or health care professional if you notice any unusual bleeding. Be careful brushing and flossing your teeth or using a toothpick because you may get an infection or bleed more easily. If you have any dental work done, tell your dentist you are receiving this medicine. Avoid taking products that contain aspirin, acetaminophen, ibuprofen, naproxen, or ketoprofen unless instructed by your doctor. These medicines may hide a fever. Do not become pregnant while taking this medicine. Women should inform their doctor if they wish to become pregnant or think they might be pregnant. There is a potential for serious side effects to an unborn child. Talk to your health care professional or pharmacist for more information. Do not breast-feed an infant while taking this medicine. Men should inform their doctor if they wish to father a child. This medicine may lower sperm counts. Do not treat diarrhea with over the counter products. Contact your doctor if you have diarrhea that lasts more than 2 days or if it is severe and watery. This medicine can make you more sensitive to the sun. Keep out of the sun. If you cannot avoid being in the sun, wear protective clothing and use sunscreen. Do not use sun lamps or tanning beds/booths. What side effects may  I notice from receiving this medicine? Side effects that you should report to your doctor or health care professional as soon as possible: -allergic reactions like skin rash, itching or hives, swelling of the face, lips, or tongue -low blood counts - this medicine may decrease the number of white blood cells, red blood cells and platelets. You may be at increased risk for infections and bleeding. -signs of infection - fever or chills, cough, sore throat, pain or difficulty passing urine -signs of decreased platelets or bleeding - bruising, pinpoint red spots on the skin, black, tarry stools, blood in the urine -signs of decreased red blood cells -  unusually weak or tired, fainting spells, lightheadedness -breathing problems -changes in vision -chest pain -mouth sores -nausea and vomiting -pain, swelling, redness at site where injected -pain, tingling, numbness in the hands or feet -redness, swelling, or sores on hands or feet -stomach pain -unusual bleeding Side effects that usually do not require medical attention (report to your doctor or health care professional if they continue or are bothersome): -changes in finger or toe nails -diarrhea -dry or itchy skin -hair loss -headache -loss of appetite -sensitivity of eyes to the light -stomach upset -unusually teary eyes This list may not describe all possible side effects. Call your doctor for medical advice about side effects. You may report side effects to FDA at 1-800-FDA-1088. Where should I keep my medicine? This drug is given in a hospital or clinic and will not be stored at home. NOTE: This sheet is a summary. It may not cover all possible information. If you have questions about this medicine, talk to your doctor, pharmacist, or health care provider.  2015, [employer]. (2008-03-07 13:53:16)

## 2014-10-23 ENCOUNTER — Telehealth: Payer: Self-pay | Admitting: *Deleted

## 2014-10-23 NOTE — Telephone Encounter (Signed)
Pt states he is doing well with pump. Denies N/V or diarrhea. No questions at this time.

## 2014-10-23 NOTE — Telephone Encounter (Signed)
-----   Message from Arty Baumgartner, RN sent at 10/22/2014 10:30 AM EST ----- Regarding: first time chemo First time folfox.  Dr Alvy Bimler.  [phone]

## 2014-10-24 ENCOUNTER — Ambulatory Visit (HOSPITAL_BASED_OUTPATIENT_CLINIC_OR_DEPARTMENT_OTHER): Payer: Medicare Other

## 2014-10-24 DIAGNOSIS — Z452 Encounter for adjustment and management of vascular access device: Secondary | ICD-10-CM

## 2014-10-24 DIAGNOSIS — C250 Malignant neoplasm of head of pancreas: Secondary | ICD-10-CM

## 2014-10-24 MED ORDER — HEPARIN SOD (PORK) LOCK FLUSH 100 UNIT/ML IV SOLN
500.0000 [IU] | Freq: Once | INTRAVENOUS | Status: AC | PRN
Start: 2014-10-24 — End: 2014-10-24
  Administered 2014-10-24: 500 [IU]
  Filled 2014-10-24: qty 5

## 2014-10-24 MED ORDER — SODIUM CHLORIDE 0.9 % IJ SOLN
10.0000 mL | INTRAMUSCULAR | Status: DC | PRN
  Administered 2014-10-24: 10 mL
  Filled 2014-10-24: qty 10

## 2014-11-05 ENCOUNTER — Other Ambulatory Visit: Payer: Self-pay

## 2014-11-05 ENCOUNTER — Ambulatory Visit: Payer: Medicare Other

## 2014-11-05 ENCOUNTER — Ambulatory Visit (HOSPITAL_BASED_OUTPATIENT_CLINIC_OR_DEPARTMENT_OTHER): Payer: Medicare Other | Admitting: Hematology and Oncology

## 2014-11-05 ENCOUNTER — Telehealth: Payer: Self-pay | Admitting: *Deleted

## 2014-11-05 ENCOUNTER — Ambulatory Visit (HOSPITAL_BASED_OUTPATIENT_CLINIC_OR_DEPARTMENT_OTHER): Payer: Medicare Other

## 2014-11-05 ENCOUNTER — Telehealth: Payer: Self-pay | Admitting: Hematology and Oncology

## 2014-11-05 ENCOUNTER — Encounter: Payer: Self-pay | Admitting: Hematology and Oncology

## 2014-11-05 ENCOUNTER — Ambulatory Visit (HOSPITAL_BASED_OUTPATIENT_CLINIC_OR_DEPARTMENT_OTHER): Payer: Medicare Other | Admitting: Lab

## 2014-11-05 VITALS — BP 145/68 | HR 78 | Temp 98.3°F | Resp 20 | Ht 74.0 in | Wt 230.0 lb

## 2014-11-05 DIAGNOSIS — R079 Chest pain, unspecified: Secondary | ICD-10-CM

## 2014-11-05 DIAGNOSIS — C250 Malignant neoplasm of head of pancreas: Secondary | ICD-10-CM

## 2014-11-05 DIAGNOSIS — C9211 Chronic myeloid leukemia, BCR/ABL-positive, in remission: Secondary | ICD-10-CM

## 2014-11-05 DIAGNOSIS — E1140 Type 2 diabetes mellitus with diabetic neuropathy, unspecified: Secondary | ICD-10-CM

## 2014-11-05 DIAGNOSIS — Z5111 Encounter for antineoplastic chemotherapy: Secondary | ICD-10-CM

## 2014-11-05 DIAGNOSIS — R0789 Other chest pain: Secondary | ICD-10-CM

## 2014-11-05 DIAGNOSIS — D630 Anemia in neoplastic disease: Secondary | ICD-10-CM

## 2014-11-05 DIAGNOSIS — Z95828 Presence of other vascular implants and grafts: Secondary | ICD-10-CM

## 2014-11-05 HISTORY — DX: Chest pain, unspecified: R07.9

## 2014-11-05 LAB — CBC WITH DIFFERENTIAL/PLATELET
BASO%: 0.5 % (ref 0.0–2.0)
Basophils Absolute: 0 10*3/uL (ref 0.0–0.1)
EOS%: 2.7 % (ref 0.0–7.0)
Eosinophils Absolute: 0.2 10*3/uL (ref 0.0–0.5)
HEMATOCRIT: 37.5 % — AB (ref 38.4–49.9)
HEMOGLOBIN: 12.5 g/dL — AB (ref 13.0–17.1)
LYMPH%: 15.1 % (ref 14.0–49.0)
MCH: 31.3 pg (ref 27.2–33.4)
MCHC: 33.3 g/dL (ref 32.0–36.0)
MCV: 94 fL (ref 79.3–98.0)
MONO#: 1 10*3/uL — AB (ref 0.1–0.9)
MONO%: 15.1 % — ABNORMAL HIGH (ref 0.0–14.0)
NEUT#: 4.2 10*3/uL (ref 1.5–6.5)
NEUT%: 66.6 % (ref 39.0–75.0)
PLATELETS: 149 10*3/uL (ref 140–400)
RBC: 3.99 10*6/uL — ABNORMAL LOW (ref 4.20–5.82)
RDW: 14.7 % — ABNORMAL HIGH (ref 11.0–14.6)
WBC: 6.3 10*3/uL (ref 4.0–10.3)
lymph#: 1 10*3/uL (ref 0.9–3.3)

## 2014-11-05 LAB — COMPREHENSIVE METABOLIC PANEL (CC13)
ALT: 28 U/L (ref 0–55)
ANION GAP: 13 meq/L — AB (ref 3–11)
AST: 30 U/L (ref 5–34)
Albumin: 3.6 g/dL (ref 3.5–5.0)
Alkaline Phosphatase: 135 U/L (ref 40–150)
BILIRUBIN TOTAL: 0.45 mg/dL (ref 0.20–1.20)
BUN: 13.5 mg/dL (ref 7.0–26.0)
CHLORIDE: 106 meq/L (ref 98–109)
CO2: 24 meq/L (ref 22–29)
Calcium: 10.1 mg/dL (ref 8.4–10.4)
Creatinine: 1.1 mg/dL (ref 0.7–1.3)
EGFR: 65 mL/min/{1.73_m2} — ABNORMAL LOW (ref >90)
GLUCOSE: 191 mg/dL — AB (ref 70–140)
Potassium: 3.8 mEq/L (ref 3.5–5.1)
Sodium: 143 mEq/L (ref 136–145)
Total Protein: 6.7 g/dL (ref 6.4–8.3)

## 2014-11-05 LAB — CANCER ANTIGEN 19-9: CA 19-9: 2447 U/mL — ABNORMAL HIGH (ref <35.0)

## 2014-11-05 MED ORDER — ONDANSETRON 8 MG/50ML IVPB (CHCC)
8.0000 mg | Freq: Once | INTRAVENOUS | Status: AC
  Administered 2014-11-05: 8 mg via INTRAVENOUS

## 2014-11-05 MED ORDER — FLUOROURACIL CHEMO INJECTION 5 GM/100ML
2400.0000 mg/m2 | INTRAVENOUS | Status: DC
  Administered 2014-11-05: 5500 mg via INTRAVENOUS
  Filled 2014-11-05: qty 110

## 2014-11-05 MED ORDER — LEUCOVORIN CALCIUM INJECTION 350 MG
400.0000 mg/m2 | Freq: Once | INTRAVENOUS | Status: AC
  Administered 2014-11-05: 920 mg via INTRAVENOUS
  Filled 2014-11-05: qty 46

## 2014-11-05 MED ORDER — DEXAMETHASONE SODIUM PHOSPHATE 10 MG/ML IJ SOLN
INTRAMUSCULAR | Status: AC
  Filled 2014-11-05: qty 1

## 2014-11-05 MED ORDER — OXALIPLATIN CHEMO INJECTION 100 MG/20ML
68.0000 mg/m2 | Freq: Once | INTRAVENOUS | Status: AC
  Administered 2014-11-05: 155 mg via INTRAVENOUS
  Filled 2014-11-05: qty 31

## 2014-11-05 MED ORDER — SODIUM CHLORIDE 0.9 % IJ SOLN
10.0000 mL | INTRAMUSCULAR | Status: DC | PRN
  Administered 2014-11-05: 10 mL via INTRAVENOUS
  Filled 2014-11-05: qty 10

## 2014-11-05 MED ORDER — DEXTROSE 5 % IV SOLN
Freq: Once | INTRAVENOUS | Status: AC
  Administered 2014-11-05: 10:00:00 via INTRAVENOUS

## 2014-11-05 MED ORDER — ONDANSETRON 8 MG/NS 50 ML IVPB
INTRAVENOUS | Status: AC
Start: 2014-11-05 — End: 2014-11-05
  Filled 2014-11-05: qty 8

## 2014-11-05 MED ORDER — DEXAMETHASONE SODIUM PHOSPHATE 10 MG/ML IJ SOLN
10.0000 mg | Freq: Once | INTRAMUSCULAR | Status: AC
  Administered 2014-11-05: 10 mg via INTRAVENOUS

## 2014-11-05 NOTE — Assessment & Plan Note (Signed)
He will continue on dasatinib for now. His CBC were within normal limits

## 2014-11-05 NOTE — Assessment & Plan Note (Signed)
Patient is warned about the risk of severe hyperglycemia and worsening peripheral neuropathy. We will monitor his blood sugar carefully and will order dose adjustment to oxaliplatin.

## 2014-11-05 NOTE — Telephone Encounter (Signed)
Gave avs & cal for Jan. Sent mess to sch tx. °

## 2014-11-05 NOTE — Patient Instructions (Signed)

## 2014-11-05 NOTE — Telephone Encounter (Signed)
Per staff message and POF I have scheduled appts. Advised scheduler of appts. JMW  

## 2014-11-05 NOTE — Assessment & Plan Note (Signed)
He tolerated treatment well with no side effects. We will proceed with the same dose as prior.

## 2014-11-05 NOTE — Progress Notes (Signed)
[place] OFFICE PROGRESS NOTE  Patient Care Team: [name], MD as PCP - General (Internal Medicine) [name], MD as Referring Physician (Specialist) Provider Not In System [name], MD as Consulting Physician (Hematology and Oncology)  SUMMARY OF ONCOLOGIC HISTORY: Oncology History   Pancreatic cancer   Primary site: Pancreas   Staging method: AJCC 7th Edition   Clinical: Stage IIA (T3, N0, M0) signed by [name], MD on 06/13/2014  4:37 PM   Summary: Stage IIA (T3, N0, M0)       Malignant neoplasm of head of pancreas   06/05/2014 Imaging  MRI abdomen showed 2.2 cm lesion in the pancreatic head obstructing the common bile duct and the pancreatic duct is highly concerning for pancreatic adenocarcinoma.  There is moderate intra and extrahepatic biliary duct dilatation.    06/05/2014 Tumor Marker CA 19-9 is elevated at 1234   06/06/2014 Imaging Ct scan of chest showed 2 lung nodules, likely benign   06/07/2014 Procedure He had ERCP and stent placement   06/07/2014 Procedure He had endoscopic ultrasound with  fine needle aspiration.   06/07/2014 Pathology Results Accession: [id number] FNA is positive for pancreatic adenoca   06/14/2014 Surgery He has port placement   06/25/2014 - 08/01/2014 Chemotherapy He received neoadjuvant chemotherapy with Gemzar   06/25/2014 - 08/06/2014 Radiation Therapy he received neoadjuvant radiation: 50.4 Gy   08/13/2014 - 08/23/2014 Hospital Admission The patient was admitted to the hospital related to uncontrolled nausea, vomiting and severe epigastric pain and was found to have significant gastric ulcer, resolved with conservative management and total parenteral nutrition   09/06/2014 Imaging Repeat CT scan show enlarging lung nodule, suspicious for early metastatic disease.   10/18/2014 Imaging CT scan of the chest, abdomen and pelvis show metastatic disease in the lung and liver.   10/18/2014 Tumor Marker CA-[id number] at 1745   10/22/2014 -   Chemotherapy The patient is placed on modified FOLFOX chemotherapy   11/05/2014 Tumor Marker Ca19-9 at 2447    INTERVAL HISTORY: Please see below for problem oriented charting. He is seen prior to cycle 2 of chemotherapy. He tolerated treatment well apart from very mild mucositis. He has resolved. This morning, he complained of chest discomfort. This discomfort is located in the retrosternal area. It is not associated with shortness of breath or diaphoresis.  REVIEW OF SYSTEMS:   Constitutional: Denies fevers, chills or abnormal weight loss Eyes: Denies blurriness of vision Ears, nose, mouth, throat, and face: Denies mucositis or sore throat Respiratory: Denies cough, dyspnea or wheezes Cardiovascular: Denies palpitation or lower extremity swelling Gastrointestinal:  Denies nausea, heartburn or change in bowel habits Skin: Denies abnormal skin rashes Lymphatics: Denies new lymphadenopathy or easy bruising Neurological:Denies numbness, tingling or new weaknesses Behavioral/Psych: Mood is stable, no new changes  All other systems were reviewed with the patient and are negative.  I have reviewed the past medical history, past surgical history, social history and family history with the patient and they are unchanged from previous note.  ALLERGIES:  is allergic to ace inhibitors and ciprofloxacin.  MEDICATIONS:  Current Outpatient Prescriptions  Medication Sig Dispense Refill  . aspirin EC 81 MG tablet Take 81 mg by mouth daily.    . B Complex-C (SUPER B COMPLEX/VITAMIN C PO) Take 1 tablet by mouth daily.     . cholecalciferol (VITAMIN D) 1000 UNITS tablet Take 1,000 Units by mouth daily.    . dasatinib (SPRYCEL) 50 MG tablet Take 50 mg by mouth at  bedtime.    . hydrocortisone cream 1 % Apply 1 application topically as needed for itching.    . insulin glargine (LANTUS) 100 UNIT/ML injection Inject 26 Units into the skin at bedtime.     Marland Kitchen lactulose (CHRONULAC) 10 GM/15ML solution Take  10 g by mouth 2 (two) times daily as needed for moderate constipation.     Marland Kitchen losartan (COZAAR) 100 MG tablet Take 100 mg by mouth every morning.     . metFORMIN (GLUCOPHAGE) 1000 MG tablet Take 1,000 mg by mouth 2 (two) times daily with a meal.    . pantoprazole (PROTONIX) 40 MG tablet Take 1 tablet (40 mg total) by mouth daily. 60 tablet 6  . promethazine (PHENERGAN) 25 MG tablet Take 25 mg by mouth every 6 (six) hours as needed for nausea or vomiting (nausea).    . saxagliptin HCl (ONGLYZA) 5 MG TABS tablet Take 5 mg by mouth daily.    . sennosides-docusate sodium (SENOKOT-S) 8.6-50 MG tablet Take 2 tablets by mouth daily.     . simvastatin (ZOCOR) 20 MG tablet Take 20 mg by mouth every evening.    . vitamin B-12 (CYANOCOBALAMIN) 1000 MCG tablet Take 1,000 mcg by mouth daily.    . diphenhydrAMINE (BENADRYL) 25 mg capsule Take 25-50 mg by mouth every 6 (six) hours as needed for itching.     Marland Kitchen HYDROmorphone (DILAUDID) 4 MG tablet Take 4 mg by mouth every 4 (four) hours as needed for severe pain (pain).     No current facility-administered medications for this visit.   Facility-Administered Medications Ordered in Other Visits  Medication Dose Route Frequency Provider Last Rate Last Dose  . fluorouracil (ADRUCIL) 5,500 mg in sodium chloride 0.9 % 150 mL chemo infusion  2,400 mg/m2 (Treatment Plan Actual) Intravenous 1 day or 1 dose [name], MD   5,500 mg at 11/05/14 1353  . heparin lock flush 100 unit/mL  500 Units Intravenous Once [name], MD      . sodium chloride 0.9 % injection 10 mL  10 mL Intravenous PRN [name], MD        PHYSICAL EXAMINATION: ECOG PERFORMANCE STATUS: 1 - Symptomatic but completely ambulatory  Filed Vitals:   11/05/14 0911  BP: 145/68  Pulse: 78  Temp: 98.3 F (36.8 C)  Resp: 20   Filed Weights   11/05/14 0911  Weight: 230 lb (104.327 kg)    GENERAL:alert, no distress and comfortable SKIN: skin color, texture, turgor are normal, no rashes or  significant lesions EYES: normal, Conjunctiva are pink and non-injected, sclera clear OROPHARYNX:no exudate, no erythema and lips, buccal mucosa, and tongue normal  NECK: supple, thyroid normal size, non-tender, without nodularity LYMPH:  no palpable lymphadenopathy in the cervical, axillary or inguinal LUNGS: clear to auscultation and percussion with normal breathing effort HEART: regular rate & rhythm and no murmurs and no lower extremity edema ABDOMEN:abdomen soft, non-tender and normal bowel sounds Musculoskeletal:no cyanosis of digits and no clubbing  NEURO: alert & oriented x 3 with fluent speech, no focal motor/sensory deficits  LABORATORY DATA:  I have reviewed the data as listed    Component Value Date/Time   NA 143 11/05/2014 0859   NA 139 08/23/2014 0422   K 3.8 11/05/2014 0859   K 4.1 08/23/2014 0422   CL 102 08/23/2014 0422   CL 107 04/27/2013 0926   CO2 24 11/05/2014 0859   CO2 28 08/23/2014 0422   GLUCOSE 191* 11/05/2014 0859   GLUCOSE 190* 08/23/2014  0422   GLUCOSE 212* 04/27/2013 0926   BUN 13.5 11/05/2014 0859   BUN 13 08/23/2014 0422   CREATININE 1.1 11/05/2014 0859   CREATININE 1.03 08/23/2014 0422   CALCIUM 10.1 11/05/2014 0859   CALCIUM 9.5 08/23/2014 0422   PROT 6.7 11/05/2014 0859   PROT 5.7* 08/23/2014 0422   ALBUMIN 3.6 11/05/2014 0859   ALBUMIN 2.7* 08/23/2014 0422   AST 30 11/05/2014 0859   AST 20 08/23/2014 0422   ALT 28 11/05/2014 0859   ALT 16 08/23/2014 0422   ALKPHOS 135 11/05/2014 0859   ALKPHOS 187* 08/23/2014 0422   BILITOT 0.45 11/05/2014 0859   BILITOT 0.4 08/23/2014 0422   GFRNONAA 71* 08/23/2014 0422   GFRAA 82* 08/23/2014 0422    No results found for: SPEP, UPEP  Lab Results  Component Value Date   WBC 6.3 11/05/2014   NEUTROABS 4.2 11/05/2014   HGB 12.5* 11/05/2014   HCT 37.5* 11/05/2014   MCV 94.0 11/05/2014   PLT 149 11/05/2014      Chemistry      Component Value Date/Time   NA 143 11/05/2014 0859   NA 139  08/23/2014 0422   K 3.8 11/05/2014 0859   K 4.1 08/23/2014 0422   CL 102 08/23/2014 0422   CL 107 04/27/2013 0926   CO2 24 11/05/2014 0859   CO2 28 08/23/2014 0422   BUN 13.5 11/05/2014 0859   BUN 13 08/23/2014 0422   CREATININE 1.1 11/05/2014 0859   CREATININE 1.03 08/23/2014 0422      Component Value Date/Time   CALCIUM 10.1 11/05/2014 0859   CALCIUM 9.5 08/23/2014 0422   ALKPHOS 135 11/05/2014 0859   ALKPHOS 187* 08/23/2014 0422   AST 30 11/05/2014 0859   AST 20 08/23/2014 0422   ALT 28 11/05/2014 0859   ALT 16 08/23/2014 0422   BILITOT 0.45 11/05/2014 0859   BILITOT 0.4 08/23/2014 0422    I reviewed the EKG and they were within normal limits ASSESSMENT & PLAN:  Malignant neoplasm of head of pancreas He tolerated treatment well with no side effects. We will proceed with the same dose as prior.  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 The patient denies recent history of bleeding such as epistaxis, hematuria or hematochezia. [name] I will observe for now.    Atypical chest pain This could be related to the position of his port. EKG was negative.  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 His CBC were within normal limits  Diabetes mellitus with neuropathy Patient is warned about the risk of severe hyperglycemia and worsening peripheral neuropathy. We will monitor his blood sugar carefully and will order dose adjustment to oxaliplatin.   Orders Placed This Encounter  Procedures  . EKG 12-Lead    Standing Status: Future     Number of Occurrences:      Standing Expiration Date: 11/06/2015    Order Specific Question:  Where should this test be performed    Answer:  OTHER   All questions were answered. The patient knows to call the clinic with any problems, questions or concerns. No barriers to learning was detected. I spent 40 minutes counseling the patient face to face. The total time spent in  the appointment was 55 minutes and more than 50% was on counseling and review of test results     [facility], [place], MD 11/05/2014 4:45 PM

## 2014-11-05 NOTE — Telephone Encounter (Signed)
First available on 1/4 is 1pm

## 2014-11-05 NOTE — Assessment & Plan Note (Signed)
This could be related to the position of his port. EKG was negative.

## 2014-11-05 NOTE — Patient Instructions (Signed)
[facility] Discharge Instructions for Patients Receiving Chemotherapy  Today you received the following chemotherapy agents Oxaliplatin, Leucovorin and Adrucil home pump.  To help prevent nausea and vomiting after your treatment, we encourage you to take your nausea medication.   If you develop nausea and vomiting that is not controlled by your nausea medication, call the clinic.   BELOW ARE SYMPTOMS THAT SHOULD BE REPORTED IMMEDIATELY:  *FEVER GREATER THAN 100.5 F  *CHILLS WITH OR WITHOUT FEVER  NAUSEA AND VOMITING THAT IS NOT CONTROLLED WITH YOUR NAUSEA MEDICATION  *UNUSUAL SHORTNESS OF BREATH  *UNUSUAL BRUISING OR BLEEDING  TENDERNESS IN MOUTH AND THROAT WITH OR WITHOUT PRESENCE OF ULCERS  *URINARY PROBLEMS  *BOWEL PROBLEMS  UNUSUAL RASH Items with * indicate a potential emergency and should be followed up as soon as possible.  Feel free to call the clinic you have any questions or concerns. The clinic phone number is [phone].

## 2014-11-05 NOTE — Assessment & Plan Note (Signed)
This is likely due to recent treatment. The patient denies recent history of bleeding such as epistaxis, hematuria or hematochezia. [name] I will observe for now.    

## 2014-11-07 ENCOUNTER — Ambulatory Visit (HOSPITAL_BASED_OUTPATIENT_CLINIC_OR_DEPARTMENT_OTHER): Payer: Medicare Other

## 2014-11-07 DIAGNOSIS — C250 Malignant neoplasm of head of pancreas: Secondary | ICD-10-CM

## 2014-11-07 MED ORDER — HEPARIN SOD (PORK) LOCK FLUSH 100 UNIT/ML IV SOLN
500.0000 [IU] | Freq: Once | INTRAVENOUS | Status: AC | PRN
  Administered 2014-11-07: 500 [IU]
  Filled 2014-11-07: qty 5

## 2014-11-07 MED ORDER — SODIUM CHLORIDE 0.9 % IJ SOLN
10.0000 mL | INTRAMUSCULAR | Status: DC | PRN
  Administered 2014-11-07: 10 mL
  Filled 2014-11-07: qty 10

## 2014-11-19 ENCOUNTER — Encounter: Payer: Self-pay | Admitting: Hematology and Oncology

## 2014-11-19 ENCOUNTER — Ambulatory Visit (HOSPITAL_BASED_OUTPATIENT_CLINIC_OR_DEPARTMENT_OTHER): Payer: Medicare Other

## 2014-11-19 ENCOUNTER — Other Ambulatory Visit: Payer: Medicare Other

## 2014-11-19 ENCOUNTER — Telehealth: Payer: Self-pay | Admitting: Hematology and Oncology

## 2014-11-19 ENCOUNTER — Ambulatory Visit (HOSPITAL_BASED_OUTPATIENT_CLINIC_OR_DEPARTMENT_OTHER): Payer: Medicare Other | Admitting: Hematology and Oncology

## 2014-11-19 ENCOUNTER — Other Ambulatory Visit (HOSPITAL_BASED_OUTPATIENT_CLINIC_OR_DEPARTMENT_OTHER): Payer: Medicare Other

## 2014-11-19 VITALS — BP 137/72 | HR 102 | Temp 98.0°F | Resp 18 | Ht 74.0 in | Wt 222.0 lb

## 2014-11-19 DIAGNOSIS — D630 Anemia in neoplastic disease: Secondary | ICD-10-CM

## 2014-11-19 DIAGNOSIS — Z452 Encounter for adjustment and management of vascular access device: Secondary | ICD-10-CM

## 2014-11-19 DIAGNOSIS — Z5111 Encounter for antineoplastic chemotherapy: Secondary | ICD-10-CM

## 2014-11-19 DIAGNOSIS — C9211 Chronic myeloid leukemia, BCR/ABL-positive, in remission: Secondary | ICD-10-CM

## 2014-11-19 DIAGNOSIS — Z95828 Presence of other vascular implants and grafts: Secondary | ICD-10-CM

## 2014-11-19 DIAGNOSIS — E1140 Type 2 diabetes mellitus with diabetic neuropathy, unspecified: Secondary | ICD-10-CM

## 2014-11-19 DIAGNOSIS — C250 Malignant neoplasm of head of pancreas: Secondary | ICD-10-CM

## 2014-11-19 DIAGNOSIS — G620 Drug-induced polyneuropathy: Secondary | ICD-10-CM | POA: Insufficient documentation

## 2014-11-19 DIAGNOSIS — T451X5A Adverse effect of antineoplastic and immunosuppressive drugs, initial encounter: Secondary | ICD-10-CM

## 2014-11-19 LAB — CBC WITH DIFFERENTIAL/PLATELET
BASO%: 1.3 % (ref 0.0–2.0)
Basophils Absolute: 0.1 10*3/uL (ref 0.0–0.1)
EOS%: 2.1 % (ref 0.0–7.0)
Eosinophils Absolute: 0.1 10*3/uL (ref 0.0–0.5)
HCT: 40 % (ref 38.4–49.9)
HGB: 12.9 g/dL — ABNORMAL LOW (ref 13.0–17.1)
LYMPH#: 1.2 10*3/uL (ref 0.9–3.3)
LYMPH%: 18.1 % (ref 14.0–49.0)
MCH: 30.4 pg (ref 27.2–33.4)
MCHC: 32.3 g/dL (ref 32.0–36.0)
MCV: 94 fL (ref 79.3–98.0)
MONO#: 1 10*3/uL — ABNORMAL HIGH (ref 0.1–0.9)
MONO%: 15.3 % — AB (ref 0.0–14.0)
NEUT%: 63.2 % (ref 39.0–75.0)
NEUTROS ABS: 4.3 10*3/uL (ref 1.5–6.5)
Platelets: 178 10*3/uL (ref 140–400)
RBC: 4.26 10*6/uL (ref 4.20–5.82)
RDW: 15.1 % — AB (ref 11.0–14.6)
WBC: 6.8 10*3/uL (ref 4.0–10.3)

## 2014-11-19 LAB — COMPREHENSIVE METABOLIC PANEL (CC13)
ALK PHOS: 229 U/L — AB (ref 40–150)
ALT: 24 U/L (ref 0–55)
AST: 31 U/L (ref 5–34)
Albumin: 3.6 g/dL (ref 3.5–5.0)
Anion Gap: 10 mEq/L (ref 3–11)
BUN: 15 mg/dL (ref 7.0–26.0)
CO2: 25 mEq/L (ref 22–29)
CREATININE: 1.2 mg/dL (ref 0.7–1.3)
Calcium: 10.4 mg/dL (ref 8.4–10.4)
Chloride: 104 mEq/L (ref 98–109)
EGFR: 59 mL/min/{1.73_m2} — ABNORMAL LOW (ref >90)
Glucose: 190 mg/dl — ABNORMAL HIGH (ref 70–140)
Potassium: 3.9 mEq/L (ref 3.5–5.1)
Sodium: 140 mEq/L (ref 136–145)
Total Bilirubin: 0.96 mg/dL (ref 0.20–1.20)
Total Protein: 7.1 g/dL (ref 6.4–8.3)

## 2014-11-19 MED ORDER — ONDANSETRON 8 MG/NS 50 ML IVPB
INTRAVENOUS | Status: AC
  Filled 2014-11-19: qty 8

## 2014-11-19 MED ORDER — LEUCOVORIN CALCIUM INJECTION 350 MG
400.0000 mg/m2 | Freq: Once | INTRAMUSCULAR | Status: AC
  Administered 2014-11-19: 920 mg via INTRAVENOUS
  Filled 2014-11-19: qty 46

## 2014-11-19 MED ORDER — DEXTROSE 5 % IV SOLN
Freq: Once | INTRAVENOUS | Status: AC
  Administered 2014-11-19: 11:00:00 via INTRAVENOUS

## 2014-11-19 MED ORDER — SODIUM CHLORIDE 0.9 % IV SOLN
2400.0000 mg/m2 | INTRAVENOUS | Status: DC
  Administered 2014-11-19: 5500 mg via INTRAVENOUS
  Filled 2014-11-19: qty 110

## 2014-11-19 MED ORDER — ONDANSETRON 8 MG/50ML IVPB (CHCC)
8.0000 mg | Freq: Once | INTRAVENOUS | Status: AC
  Administered 2014-11-19: 8 mg via INTRAVENOUS

## 2014-11-19 MED ORDER — DEXAMETHASONE SODIUM PHOSPHATE 10 MG/ML IJ SOLN
INTRAMUSCULAR | Status: AC
  Filled 2014-11-19: qty 1

## 2014-11-19 MED ORDER — SODIUM CHLORIDE 0.9 % IJ SOLN
10.0000 mL | INTRAMUSCULAR | Status: DC | PRN
  Administered 2014-11-19: 10 mL via INTRAVENOUS
  Filled 2014-11-19: qty 10

## 2014-11-19 MED ORDER — DEXAMETHASONE SODIUM PHOSPHATE 10 MG/ML IJ SOLN
10.0000 mg | Freq: Once | INTRAMUSCULAR | Status: AC
  Administered 2014-11-19: 10 mg via INTRAVENOUS

## 2014-11-19 MED ORDER — OXALIPLATIN CHEMO INJECTION 100 MG/20ML
68.0000 mg/m2 | Freq: Once | INTRAVENOUS | Status: AC
  Administered 2014-11-19: 155 mg via INTRAVENOUS
  Filled 2014-11-19: qty 31

## 2014-11-19 NOTE — Assessment & Plan Note (Signed)
He will continue on dasatinib for now. His CBC were within normal limits

## 2014-11-19 NOTE — Telephone Encounter (Signed)
gv and printed appt sched and avs for pt for Jan and Feb....sed added tx....gv pt barium

## 2014-11-19 NOTE — Assessment & Plan Note (Signed)
He tolerated treatment well with no side effects. We will proceed with the same dose as prior. I plan to continue to monitor tumor markers closely. I will order repeat CT scan of the chest, abdomen and pelvis to be done before I see him back next month to assess response to treatment.

## 2014-11-19 NOTE — Assessment & Plan Note (Signed)
Patient is warned about the risk of severe hyperglycemia and worsening peripheral neuropathy. We will monitor his blood sugar carefully and will order dose adjustment to oxaliplatin.

## 2014-11-19 NOTE — Patient Instructions (Signed)
[facility] Discharge Instructions for Patients Receiving Chemotherapy  Today you received the following chemotherapy agents: Oxaliplatin, Leucovorin, 5FU.  To help prevent nausea and vomiting after your treatment, we encourage you to take your nausea medication: Phenergan 25 mg every 6 hours as needed.   If you develop nausea and vomiting that is not controlled by your nausea medication, call the clinic.   BELOW ARE SYMPTOMS THAT SHOULD BE REPORTED IMMEDIATELY:  *FEVER GREATER THAN 100.5 F  *CHILLS WITH OR WITHOUT FEVER  NAUSEA AND VOMITING THAT IS NOT CONTROLLED WITH YOUR NAUSEA MEDICATION  *UNUSUAL SHORTNESS OF BREATH  *UNUSUAL BRUISING OR BLEEDING  TENDERNESS IN MOUTH AND THROAT WITH OR WITHOUT PRESENCE OF ULCERS  *URINARY PROBLEMS  *BOWEL PROBLEMS  UNUSUAL RASH Items with * indicate a potential emergency and should be followed up as soon as possible.  Feel free to call the clinic you have any questions or concerns. The clinic phone number is [phone].

## 2014-11-19 NOTE — Progress Notes (Signed)
[place] OFFICE PROGRESS NOTE  Patient Care Team: [name], MD as PCP - General (Internal Medicine) [name], MD as Referring Physician (Specialist) Provider Not In System [name], MD as Consulting Physician (Hematology and Oncology)  SUMMARY OF ONCOLOGIC HISTORY: Oncology History   Pancreatic cancer   Primary site: Pancreas   Staging method: AJCC 7th Edition   Clinical: Stage IIA (T3, N0, M0) signed by [name], MD on 06/13/2014  4:37 PM   Summary: Stage IIA (T3, N0, M0)       Malignant neoplasm of head of pancreas   06/05/2014 Imaging  MRI abdomen showed 2.2 cm lesion in the pancreatic head obstructing the common bile duct and the pancreatic duct is highly concerning for pancreatic adenocarcinoma.  There is moderate intra and extrahepatic biliary duct dilatation.    06/05/2014 Tumor Marker CA 19-9 is elevated at 1234   06/06/2014 Imaging Ct scan of chest showed 2 lung nodules, likely benign   06/07/2014 Procedure He had ERCP and stent placement   06/07/2014 Procedure He had endoscopic ultrasound with  fine needle aspiration.   06/07/2014 Pathology Results Accession: [id number] FNA is positive for pancreatic adenoca   06/14/2014 Surgery He has port placement   06/25/2014 - 08/01/2014 Chemotherapy He received neoadjuvant chemotherapy with Gemzar   06/25/2014 - 08/06/2014 Radiation Therapy he received neoadjuvant radiation: 50.4 Gy   08/13/2014 - 08/23/2014 Hospital Admission The patient was admitted to the hospital related to uncontrolled nausea, vomiting and severe epigastric pain and was found to have significant gastric ulcer, resolved with conservative management and total parenteral nutrition   09/06/2014 Imaging Repeat CT scan show enlarging lung nodule, suspicious for early metastatic disease.   10/18/2014 Imaging CT scan of the chest, abdomen and pelvis show metastatic disease in the lung and liver.   10/18/2014 Tumor Marker CA-19-9 at 1745   10/22/2014 -   Chemotherapy The patient is placed on modified FOLFOX chemotherapy   11/05/2014 Tumor Marker Ca19-9 at 2447    INTERVAL HISTORY: Please see below for problem oriented charting. He returns prior to cycle 3 of treatment. He tolerated prior treatment well. He complained of mild increased sensitivity to cold and neuropathy at the tips of fingers. He complained of orthostatic hypotension and had minor falls at home. He did not sustain major injuries.  REVIEW OF SYSTEMS:   Constitutional: Denies fevers, chills or abnormal weight loss Eyes: Denies blurriness of vision Ears, nose, mouth, throat, and face: Denies mucositis or sore throat Respiratory: Denies cough, dyspnea or wheezes Cardiovascular: Denies palpitation, chest discomfort or lower extremity swelling Gastrointestinal:  Denies nausea, heartburn or change in bowel habits Skin: Denies abnormal skin rashes Lymphatics: Denies new lymphadenopathy or easy bruising Neurological:Denies numbness, tingling or new weaknesses Behavioral/Psych: Mood is stable, no new changes  All other systems were reviewed with the patient and are negative.  I have reviewed the past medical history, past surgical history, social history and family history with the patient and they are unchanged from previous note.  ALLERGIES:  is allergic to ace inhibitors and ciprofloxacin.  MEDICATIONS:  Current Outpatient Prescriptions  Medication Sig Dispense Refill  . aspirin EC 81 MG tablet Take 81 mg by mouth daily.    . B Complex-C (SUPER B COMPLEX/VITAMIN C PO) Take 1 tablet by mouth daily.     . cholecalciferol (VITAMIN D) 1000 UNITS tablet Take 1,000 Units by mouth daily.    . dasatinib (SPRYCEL) 50 MG tablet Take 50 mg by mouth  at bedtime.    . diphenhydrAMINE (BENADRYL) 25 mg capsule Take 25-50 mg by mouth every 6 (six) hours as needed for itching.     . hydrocortisone cream 1 % Apply 1 application topically as needed for itching.    Marland Kitchen HYDROmorphone (DILAUDID)  4 MG tablet Take 4 mg by mouth every 4 (four) hours as needed for severe pain (pain).    . insulin glargine (LANTUS) 100 UNIT/ML injection Inject 26 Units into the skin at bedtime.     Marland Kitchen lactulose (CHRONULAC) 10 GM/15ML solution Take 10 g by mouth 2 (two) times daily as needed for moderate constipation.     Marland Kitchen losartan (COZAAR) 100 MG tablet Take 100 mg by mouth every morning.     . metFORMIN (GLUCOPHAGE) 1000 MG tablet Take 1,000 mg by mouth 2 (two) times daily with a meal.    . pantoprazole (PROTONIX) 40 MG tablet Take 1 tablet (40 mg total) by mouth daily. 60 tablet 6  . promethazine (PHENERGAN) 25 MG tablet Take 25 mg by mouth every 6 (six) hours as needed for nausea or vomiting (nausea).    . saxagliptin HCl (ONGLYZA) 5 MG TABS tablet Take 5 mg by mouth daily.    . sennosides-docusate sodium (SENOKOT-S) 8.6-50 MG tablet Take 2 tablets by mouth daily.     . simvastatin (ZOCOR) 20 MG tablet Take 20 mg by mouth every evening.    . vitamin B-12 (CYANOCOBALAMIN) 1000 MCG tablet Take 1,000 mcg by mouth daily.     No current facility-administered medications for this visit.   Facility-Administered Medications Ordered in Other Visits  Medication Dose Route Frequency Provider Last Rate Last Dose  . fluorouracil (ADRUCIL) 5,500 mg in sodium chloride 0.9 % 150 mL chemo infusion  2,400 mg/m2 (Treatment Plan Actual) Intravenous 1 day or 1 dose [name], MD   5,500 mg at 11/19/14 1417  . heparin lock flush 100 unit/mL  500 Units Intravenous Once [name], MD      . sodium chloride 0.9 % injection 10 mL  10 mL Intravenous PRN [name], MD        PHYSICAL EXAMINATION: ECOG PERFORMANCE STATUS: 1 - Symptomatic but completely ambulatory  Filed Vitals:   11/19/14 0936  BP: 137/72  Pulse: 102  Temp: 98 F (36.7 C)  Resp: 18   Filed Weights   11/19/14 0936  Weight: 222 lb (100.699 kg)    GENERAL:alert, no distress and comfortable. He is obese SKIN: skin color, texture, turgor are normal, no  rashes or significant lesions EYES: normal, Conjunctiva are pink and non-injected, sclera clear OROPHARYNX:no exudate, no erythema and lips, buccal mucosa, and tongue normal  NECK: supple, thyroid normal size, non-tender, without nodularity LYMPH:  no palpable lymphadenopathy in the cervical, axillary or inguinal LUNGS: clear to auscultation and percussion with normal breathing effort HEART: regular rate & rhythm and no murmurs and no lower extremity edema ABDOMEN:abdomen soft, non-tender and normal bowel sounds Musculoskeletal:no cyanosis of digits and no clubbing  NEURO: alert & oriented x 3 with fluent speech, no focal motor/sensory deficits  LABORATORY DATA:  I have reviewed the data as listed    Component Value Date/Time   NA 140 11/19/2014 0911   NA 139 08/23/2014 0422   K 3.9 11/19/2014 0911   K 4.1 08/23/2014 0422   CL 102 08/23/2014 0422   CL 107 04/27/2013 0926   CO2 25 11/19/2014 0911   CO2 28 08/23/2014 0422   GLUCOSE 190* 11/19/2014 0911  GLUCOSE 190* 08/23/2014 0422   GLUCOSE 212* 04/27/2013 0926   BUN 15.0 11/19/2014 0911   BUN 13 08/23/2014 0422   CREATININE 1.2 11/19/2014 0911   CREATININE 1.03 08/23/2014 0422   CALCIUM 10.4 11/19/2014 0911   CALCIUM 9.5 08/23/2014 0422   PROT 7.1 11/19/2014 0911   PROT 5.7* 08/23/2014 0422   ALBUMIN 3.6 11/19/2014 0911   ALBUMIN 2.7* 08/23/2014 0422   AST 31 11/19/2014 0911   AST 20 08/23/2014 0422   ALT 24 11/19/2014 0911   ALT 16 08/23/2014 0422   ALKPHOS 229* 11/19/2014 0911   ALKPHOS 187* 08/23/2014 0422   BILITOT 0.96 11/19/2014 0911   BILITOT 0.4 08/23/2014 0422   GFRNONAA 71* 08/23/2014 0422   GFRAA 82* 08/23/2014 0422    No results found for: SPEP, UPEP  Lab Results  Component Value Date   WBC 6.8 11/19/2014   NEUTROABS 4.3 11/19/2014   HGB 12.9* 11/19/2014   HCT 40.0 11/19/2014   MCV 94.0 11/19/2014   PLT 178 11/19/2014      Chemistry      Component Value Date/Time   NA 140 11/19/2014 0911    NA 139 08/23/2014 0422   K 3.9 11/19/2014 0911   K 4.1 08/23/2014 0422   CL 102 08/23/2014 0422   CL 107 04/27/2013 0926   CO2 25 11/19/2014 0911   CO2 28 08/23/2014 0422   BUN 15.0 11/19/2014 0911   BUN 13 08/23/2014 0422   CREATININE 1.2 11/19/2014 0911   CREATININE 1.03 08/23/2014 0422      Component Value Date/Time   CALCIUM 10.4 11/19/2014 0911   CALCIUM 9.5 08/23/2014 0422   ALKPHOS 229* 11/19/2014 0911   ALKPHOS 187* 08/23/2014 0422   AST 31 11/19/2014 0911   AST 20 08/23/2014 0422   ALT 24 11/19/2014 0911   ALT 16 08/23/2014 0422   BILITOT 0.96 11/19/2014 0911   BILITOT 0.4 08/23/2014 0422     ASSESSMENT & PLAN:  Malignant neoplasm of head of pancreas He tolerated treatment well with no side effects. We will proceed with the same dose as prior. I plan to continue to monitor tumor markers closely. I will order repeat CT scan of the chest, abdomen and pelvis to be done before I see him back next month to assess response to treatment.  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 The patient denies recent history of bleeding such as epistaxis, hematuria or hematochezia. [name] I will observe for now.    Diabetes mellitus with neuropathy Patient is warned about the risk of severe hyperglycemia and worsening peripheral neuropathy. We will monitor his blood sugar carefully and will order dose adjustment to oxaliplatin.  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 His CBC were within normal limits   Orders Placed This Encounter  Procedures  . CT Chest W Contrast    Standing Status: Future     Number of Occurrences:      Standing Expiration Date: 01/19/2016    Order Specific Question:  Reason for Exam (SYMPTOM  OR DIAGNOSIS REQUIRED)     Answer:  staging pancreatic ca, asess response to Rx    Order Specific Question:  Preferred imaging location?    Answer:  [facility]  . CT Abdomen Pelvis W Contrast    Standing  Status: Future     Number of Occurrences:      Standing Expiration Date: 02/19/2016    Order Specific Question:  Reason for Exam (SYMPTOM  OR DIAGNOSIS REQUIRED)    Answer:  staging pancreatic ca, asess response to Rx    Order Specific Question:  Preferred imaging location?    Answer:  [facility]   All questions were answered. The patient knows to call the clinic with any problems, questions or concerns. No barriers to learning was detected. I spent 30 minutes counseling the patient face to face. The total time spent in the appointment was 40 minutes and more than 50% was on counseling and review of test results     [facility], [place], MD 11/19/2014 3:54 PM

## 2014-11-19 NOTE — Assessment & Plan Note (Signed)
This is likely due to recent treatment. The patient denies recent history of bleeding such as epistaxis, hematuria or hematochezia. [name] I will observe for now.    

## 2014-11-19 NOTE — Assessment & Plan Note (Signed)
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 He will continue dose adjusted treatment with oxaliplatin

## 2014-11-19 NOTE — Patient Instructions (Signed)

## 2014-11-20 LAB — CANCER ANTIGEN 19-9: CA 19-9: 2474.5 U/mL — ABNORMAL HIGH (ref <35.0)

## 2014-11-21 ENCOUNTER — Ambulatory Visit (HOSPITAL_BASED_OUTPATIENT_CLINIC_OR_DEPARTMENT_OTHER): Payer: Medicare Other

## 2014-11-21 DIAGNOSIS — C250 Malignant neoplasm of head of pancreas: Secondary | ICD-10-CM

## 2014-11-21 DIAGNOSIS — Z452 Encounter for adjustment and management of vascular access device: Secondary | ICD-10-CM

## 2014-11-21 MED ORDER — SODIUM CHLORIDE 0.9 % IJ SOLN
10.0000 mL | INTRAMUSCULAR | Status: DC | PRN
  Administered 2014-11-21: 10 mL
  Filled 2014-11-21: qty 10

## 2014-11-21 MED ORDER — HEPARIN SOD (PORK) LOCK FLUSH 100 UNIT/ML IV SOLN
500.0000 [IU] | Freq: Once | INTRAVENOUS | Status: AC | PRN
  Administered 2014-11-21: 500 [IU]
  Filled 2014-11-21: qty 5

## 2014-11-26 ENCOUNTER — Telehealth: Payer: Self-pay | Admitting: *Deleted

## 2014-11-26 NOTE — Telephone Encounter (Signed)
Received fax from [facility] stating Spry[facility] to be shipped 11/27/14

## 2014-12-03 ENCOUNTER — Ambulatory Visit (HOSPITAL_BASED_OUTPATIENT_CLINIC_OR_DEPARTMENT_OTHER): Payer: Medicare Other

## 2014-12-03 ENCOUNTER — Ambulatory Visit: Payer: Medicare Other | Admitting: Hematology and Oncology

## 2014-12-03 ENCOUNTER — Other Ambulatory Visit: Payer: Medicare Other

## 2014-12-03 ENCOUNTER — Other Ambulatory Visit (HOSPITAL_BASED_OUTPATIENT_CLINIC_OR_DEPARTMENT_OTHER): Payer: Medicare Other

## 2014-12-03 DIAGNOSIS — C250 Malignant neoplasm of head of pancreas: Secondary | ICD-10-CM

## 2014-12-03 DIAGNOSIS — Z5111 Encounter for antineoplastic chemotherapy: Secondary | ICD-10-CM

## 2014-12-03 DIAGNOSIS — Z95828 Presence of other vascular implants and grafts: Secondary | ICD-10-CM

## 2014-12-03 LAB — CBC WITH DIFFERENTIAL/PLATELET
BASO%: 0.7 % (ref 0.0–2.0)
Basophils Absolute: 0.1 10*3/uL (ref 0.0–0.1)
EOS%: 0.5 % (ref 0.0–7.0)
Eosinophils Absolute: 0 10*3/uL (ref 0.0–0.5)
HEMATOCRIT: 34.8 % — AB (ref 38.4–49.9)
HGB: 11.6 g/dL — ABNORMAL LOW (ref 13.0–17.1)
LYMPH%: 9.1 % — ABNORMAL LOW (ref 14.0–49.0)
MCH: 31.4 pg (ref 27.2–33.4)
MCHC: 33.3 g/dL (ref 32.0–36.0)
MCV: 94.2 fL (ref 79.3–98.0)
MONO#: 1.3 10*3/uL — ABNORMAL HIGH (ref 0.1–0.9)
MONO%: 15.4 % — ABNORMAL HIGH (ref 0.0–14.0)
NEUT#: 6.5 10*3/uL (ref 1.5–6.5)
NEUT%: 74.3 % (ref 39.0–75.0)
Platelets: 167 10*3/uL (ref 140–400)
RBC: 3.7 10*6/uL — AB (ref 4.20–5.82)
RDW: 16.7 % — ABNORMAL HIGH (ref 11.0–14.6)
WBC: 8.8 10*3/uL (ref 4.0–10.3)
lymph#: 0.8 10*3/uL — ABNORMAL LOW (ref 0.9–3.3)

## 2014-12-03 LAB — COMPREHENSIVE METABOLIC PANEL (CC13)
ALT: 25 U/L (ref 0–55)
ANION GAP: 12 meq/L — AB (ref 3–11)
AST: 32 U/L (ref 5–34)
Albumin: 3.2 g/dL — ABNORMAL LOW (ref 3.5–5.0)
Alkaline Phosphatase: 205 U/L — ABNORMAL HIGH (ref 40–150)
BUN: 13.6 mg/dL (ref 7.0–26.0)
CO2: 23 mEq/L (ref 22–29)
Calcium: 9.7 mg/dL (ref 8.4–10.4)
Chloride: 105 mEq/L (ref 98–109)
Creatinine: 1.1 mg/dL (ref 0.7–1.3)
EGFR: 67 mL/min/{1.73_m2} — AB (ref >90)
Glucose: 156 mg/dl — ABNORMAL HIGH (ref 70–140)
Potassium: 4.1 mEq/L (ref 3.5–5.1)
SODIUM: 140 meq/L (ref 136–145)
TOTAL PROTEIN: 6.6 g/dL (ref 6.4–8.3)
Total Bilirubin: 1.1 mg/dL (ref 0.20–1.20)

## 2014-12-03 MED ORDER — LEUCOVORIN CALCIUM INJECTION 350 MG
400.0000 mg/m2 | Freq: Once | INTRAMUSCULAR | Status: AC
  Administered 2014-12-03: 920 mg via INTRAVENOUS
  Filled 2014-12-03: qty 46

## 2014-12-03 MED ORDER — FLUOROURACIL CHEMO INJECTION 5 GM/100ML
2400.0000 mg/m2 | INTRAVENOUS | Status: DC
  Administered 2014-12-03: 5500 mg via INTRAVENOUS
  Filled 2014-12-03: qty 110

## 2014-12-03 MED ORDER — OXALIPLATIN CHEMO INJECTION 100 MG/20ML
68.0000 mg/m2 | Freq: Once | INTRAVENOUS | Status: AC
  Administered 2014-12-03: 155 mg via INTRAVENOUS
  Filled 2014-12-03: qty 31

## 2014-12-03 MED ORDER — DEXTROSE 5 % IV SOLN
Freq: Once | INTRAVENOUS | Status: AC
  Administered 2014-12-03: 12:00:00 via INTRAVENOUS

## 2014-12-03 MED ORDER — DEXAMETHASONE SODIUM PHOSPHATE 10 MG/ML IJ SOLN
INTRAMUSCULAR | Status: AC
  Filled 2014-12-03: qty 1

## 2014-12-03 MED ORDER — ONDANSETRON 8 MG/NS 50 ML IVPB
INTRAVENOUS | Status: AC
  Filled 2014-12-03: qty 8

## 2014-12-03 MED ORDER — SODIUM CHLORIDE 0.9 % IJ SOLN
10.0000 mL | INTRAMUSCULAR | Status: DC | PRN
  Administered 2014-12-03: 10 mL via INTRAVENOUS
  Filled 2014-12-03: qty 10

## 2014-12-03 MED ORDER — ONDANSETRON 8 MG/50ML IVPB (CHCC)
8.0000 mg | Freq: Once | INTRAVENOUS | Status: AC
  Administered 2014-12-03: 8 mg via INTRAVENOUS

## 2014-12-03 MED ORDER — DEXAMETHASONE SODIUM PHOSPHATE 10 MG/ML IJ SOLN
10.0000 mg | Freq: Once | INTRAMUSCULAR | Status: AC
  Administered 2014-12-03: 10 mg via INTRAVENOUS

## 2014-12-03 NOTE — Patient Instructions (Signed)
[place] Discharge Instructions for Patients Receiving Chemotherapy  Today you received the following chemotherapy agents:  Oxaliplatin, Leucovorin and 5FU  To help prevent nausea and vomiting after your treatment, we encourage you to take your nausea medication as ordered per MD.   If you develop nausea and vomiting that is not controlled by your nausea medication, call the clinic.   BELOW ARE SYMPTOMS THAT SHOULD BE REPORTED IMMEDIATELY:  *FEVER GREATER THAN 100.5 F  *CHILLS WITH OR WITHOUT FEVER  NAUSEA AND VOMITING THAT IS NOT CONTROLLED WITH YOUR NAUSEA MEDICATION  *UNUSUAL SHORTNESS OF BREATH  *UNUSUAL BRUISING OR BLEEDING  TENDERNESS IN MOUTH AND THROAT WITH OR WITHOUT PRESENCE OF ULCERS  *URINARY PROBLEMS  *BOWEL PROBLEMS  UNUSUAL RASH Items with * indicate a potential emergency and should be followed up as soon as possible.  Feel free to call the clinic you have any questions or concerns. The clinic phone number is [phone].

## 2014-12-03 NOTE — Patient Instructions (Signed)

## 2014-12-04 LAB — CANCER ANTIGEN 19-9: CA 19-9: 2015.5 U/mL — ABNORMAL HIGH (ref <35.0)

## 2014-12-05 ENCOUNTER — Ambulatory Visit (HOSPITAL_BASED_OUTPATIENT_CLINIC_OR_DEPARTMENT_OTHER): Payer: Medicare Other

## 2014-12-05 DIAGNOSIS — C250 Malignant neoplasm of head of pancreas: Secondary | ICD-10-CM

## 2014-12-05 MED ORDER — SODIUM CHLORIDE 0.9 % IJ SOLN
10.0000 mL | INTRAMUSCULAR | Status: DC | PRN
  Administered 2014-12-05: 10 mL
  Filled 2014-12-05: qty 10

## 2014-12-05 MED ORDER — HEPARIN SOD (PORK) LOCK FLUSH 100 UNIT/ML IV SOLN
500.0000 [IU] | Freq: Once | INTRAVENOUS | Status: AC | PRN
  Administered 2014-12-05: 500 [IU]
  Filled 2014-12-05: qty 5

## 2014-12-11 ENCOUNTER — Telehealth: Payer: Self-pay | Admitting: *Deleted

## 2014-12-11 NOTE — Telephone Encounter (Signed)
Pt's wife called inquiring about port access on Friday and " will they leave the needle in over the weekend for his treatment on Monday? " " we have a busy weekend and would rather not have that needle left in him "  This RN informed wife - needle is removed by radiology post scan.  No other needs at this time.

## 2014-12-14 ENCOUNTER — Other Ambulatory Visit (HOSPITAL_BASED_OUTPATIENT_CLINIC_OR_DEPARTMENT_OTHER): Payer: Medicare Other

## 2014-12-14 ENCOUNTER — Encounter (HOSPITAL_COMMUNITY): Payer: Self-pay

## 2014-12-14 ENCOUNTER — Ambulatory Visit (HOSPITAL_BASED_OUTPATIENT_CLINIC_OR_DEPARTMENT_OTHER): Payer: Medicare Other

## 2014-12-14 ENCOUNTER — Ambulatory Visit (HOSPITAL_COMMUNITY)
Admission: RE | Admit: 2014-12-14 | Discharge: 2014-12-14 | Disposition: A | Discharge status: Home / Self Care | Payer: Medicare Other | Source: Ambulatory Visit | Attending: Hematology and Oncology | Admitting: Hematology and Oncology

## 2014-12-14 VITALS — BP 131/83 | HR 67 | Temp 97.7°F

## 2014-12-14 DIAGNOSIS — Z95828 Presence of other vascular implants and grafts: Secondary | ICD-10-CM

## 2014-12-14 DIAGNOSIS — C250 Malignant neoplasm of head of pancreas: Secondary | ICD-10-CM

## 2014-12-14 LAB — COMPREHENSIVE METABOLIC PANEL (CC13)
ALBUMIN: 3.4 g/dL — AB (ref 3.5–5.0)
ALK PHOS: 274 U/L — AB (ref 40–150)
ALT: 25 U/L (ref 0–55)
ANION GAP: 11 meq/L (ref 3–11)
AST: 40 U/L — ABNORMAL HIGH (ref 5–34)
BUN: 13.4 mg/dL (ref 7.0–26.0)
CHLORIDE: 106 meq/L (ref 98–109)
CO2: 25 meq/L (ref 22–29)
Calcium: 9.9 mg/dL (ref 8.4–10.4)
Creatinine: 1 mg/dL (ref 0.7–1.3)
EGFR: 77 mL/min/{1.73_m2} — ABNORMAL LOW (ref >90)
Glucose: 115 mg/dl (ref 70–140)
Potassium: 4.1 mEq/L (ref 3.5–5.1)
SODIUM: 142 meq/L (ref 136–145)
TOTAL PROTEIN: 7.1 g/dL (ref 6.4–8.3)
Total Bilirubin: 0.53 mg/dL (ref 0.20–1.20)

## 2014-12-14 LAB — CBC WITH DIFFERENTIAL/PLATELET
BASO%: 1.3 % (ref 0.0–2.0)
Basophils Absolute: 0.1 10*3/uL (ref 0.0–0.1)
EOS%: 3.7 % (ref 0.0–7.0)
Eosinophils Absolute: 0.2 10*3/uL (ref 0.0–0.5)
HEMATOCRIT: 36.1 % — AB (ref 38.4–49.9)
HGB: 12 g/dL — ABNORMAL LOW (ref 13.0–17.1)
LYMPH%: 19.6 % (ref 14.0–49.0)
MCH: 31.4 pg (ref 27.2–33.4)
MCHC: 33.3 g/dL (ref 32.0–36.0)
MCV: 94.3 fL (ref 79.3–98.0)
MONO#: 1 10*3/uL — ABNORMAL HIGH (ref 0.1–0.9)
MONO%: 17.6 % — AB (ref 0.0–14.0)
NEUT#: 3.3 10*3/uL (ref 1.5–6.5)
NEUT%: 57.8 % (ref 39.0–75.0)
PLATELETS: 203 10*3/uL (ref 140–400)
RBC: 3.83 10*6/uL — ABNORMAL LOW (ref 4.20–5.82)
RDW: 17.9 % — AB (ref 11.0–14.6)
WBC: 5.6 10*3/uL (ref 4.0–10.3)
lymph#: 1.1 10*3/uL (ref 0.9–3.3)

## 2014-12-14 LAB — CANCER ANTIGEN 19-9: CA 19-9: 2049.7 U/mL — ABNORMAL HIGH (ref <35.0)

## 2014-12-14 MED ORDER — IOHEXOL 300 MG/ML  SOLN
100.0000 mL | Freq: Once | INTRAMUSCULAR | Status: AC | PRN
  Administered 2014-12-14: 100 mL via INTRAVENOUS

## 2014-12-14 MED ORDER — SODIUM CHLORIDE 0.9 % IJ SOLN
10.0000 mL | INTRAMUSCULAR | Status: DC | PRN
  Administered 2014-12-14: 10 mL via INTRAVENOUS
  Filled 2014-12-14: qty 10

## 2014-12-14 MED ORDER — HEPARIN SOD (PORK) LOCK FLUSH 100 UNIT/ML IV SOLN
500.0000 [IU] | Freq: Once | INTRAVENOUS | Status: AC
  Administered 2014-12-14: 500 [IU] via INTRAVENOUS
  Filled 2014-12-14: qty 5

## 2014-12-14 NOTE — Patient Instructions (Signed)

## 2014-12-17 ENCOUNTER — Encounter: Payer: Self-pay | Admitting: Hematology and Oncology

## 2014-12-17 ENCOUNTER — Other Ambulatory Visit: Payer: Self-pay | Admitting: *Deleted

## 2014-12-17 ENCOUNTER — Ambulatory Visit (HOSPITAL_BASED_OUTPATIENT_CLINIC_OR_DEPARTMENT_OTHER): Payer: Medicare Other

## 2014-12-17 ENCOUNTER — Ambulatory Visit (HOSPITAL_BASED_OUTPATIENT_CLINIC_OR_DEPARTMENT_OTHER): Payer: Medicare Other | Admitting: Hematology and Oncology

## 2014-12-17 ENCOUNTER — Telehealth: Payer: Self-pay | Admitting: Hematology and Oncology

## 2014-12-17 ENCOUNTER — Telehealth: Payer: Self-pay | Admitting: *Deleted

## 2014-12-17 VITALS — BP 151/70 | HR 96 | Temp 98.0°F | Resp 18 | Ht 74.0 in | Wt 222.2 lb

## 2014-12-17 DIAGNOSIS — C250 Malignant neoplasm of head of pancreas: Secondary | ICD-10-CM

## 2014-12-17 DIAGNOSIS — C9211 Chronic myeloid leukemia, BCR/ABL-positive, in remission: Secondary | ICD-10-CM

## 2014-12-17 DIAGNOSIS — R748 Abnormal levels of other serum enzymes: Secondary | ICD-10-CM

## 2014-12-17 DIAGNOSIS — C787 Secondary malignant neoplasm of liver and intrahepatic bile duct: Secondary | ICD-10-CM

## 2014-12-17 DIAGNOSIS — C9111 Chronic lymphocytic leukemia of B-cell type in remission: Secondary | ICD-10-CM

## 2014-12-17 DIAGNOSIS — D630 Anemia in neoplastic disease: Secondary | ICD-10-CM

## 2014-12-17 DIAGNOSIS — Z5111 Encounter for antineoplastic chemotherapy: Secondary | ICD-10-CM

## 2014-12-17 MED ORDER — DEXAMETHASONE SODIUM PHOSPHATE 10 MG/ML IJ SOLN
10.0000 mg | Freq: Once | INTRAMUSCULAR | Status: AC
  Administered 2014-12-17: 10 mg via INTRAVENOUS

## 2014-12-17 MED ORDER — PACLITAXEL PROTEIN-BOUND CHEMO INJECTION 100 MG
100.0000 mg/m2 | Freq: Once | INTRAVENOUS | Status: AC
  Administered 2014-12-17: 225 mg via INTRAVENOUS
  Filled 2014-12-17: qty 45

## 2014-12-17 MED ORDER — SODIUM CHLORIDE 0.9 % IV SOLN
Freq: Once | INTRAVENOUS | Status: AC
  Administered 2014-12-17: 10:00:00 via INTRAVENOUS

## 2014-12-17 MED ORDER — ONDANSETRON 8 MG/50ML IVPB (CHCC)
8.0000 mg | Freq: Once | INTRAVENOUS | Status: AC
  Administered 2014-12-17: 8 mg via INTRAVENOUS

## 2014-12-17 MED ORDER — HEPARIN SOD (PORK) LOCK FLUSH 100 UNIT/ML IV SOLN
500.0000 [IU] | Freq: Once | INTRAVENOUS | Status: AC | PRN
  Administered 2014-12-17: 500 [IU]
  Filled 2014-12-17: qty 5

## 2014-12-17 MED ORDER — SODIUM CHLORIDE 0.9 % IJ SOLN
10.0000 mL | INTRAMUSCULAR | Status: DC | PRN
  Administered 2014-12-17: 10 mL
  Filled 2014-12-17: qty 10

## 2014-12-17 MED ORDER — DEXAMETHASONE SODIUM PHOSPHATE 10 MG/ML IJ SOLN
INTRAMUSCULAR | Status: AC
  Filled 2014-12-17: qty 1

## 2014-12-17 MED ORDER — SODIUM CHLORIDE 0.9 % IV SOLN
800.0000 mg/m2 | Freq: Once | INTRAVENOUS | Status: AC
  Administered 2014-12-17: 1824 mg via INTRAVENOUS
  Filled 2014-12-17: qty 47.97

## 2014-12-17 MED ORDER — ONDANSETRON 8 MG/NS 50 ML IVPB
INTRAVENOUS | Status: AC
  Filled 2014-12-17: qty 8

## 2014-12-17 NOTE — Telephone Encounter (Signed)
Pt confirmed labs/ov/flush per 02/01 POF, gave pt AVS.... KJ, sent msg to add chemo

## 2014-12-17 NOTE — Progress Notes (Signed)
Ok to treat with CMET results per Dr. Alvy Bimler.

## 2014-12-17 NOTE — Telephone Encounter (Signed)
Per staff message and POF I have scheduled appts. Advised scheduler of appts and to move lab/flush appts. Advised scheduler that appt on 2/22 can't be scheduled due to another MD appt.  JMW

## 2014-12-17 NOTE — Assessment & Plan Note (Signed)
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 We will continue with dose adjustment as above.

## 2014-12-17 NOTE — Progress Notes (Signed)
[facility] OFFICE PROGRESS NOTE  Patient Care Team: [name], MD as PCP - General (Internal Medicine) [name], MD as Referring Physician (Specialist) Provider Not In System [name], MD as Consulting Physician (Hematology and Oncology)  SUMMARY OF ONCOLOGIC HISTORY: Oncology History   Pancreatic cancer   Primary site: Pancreas   Staging method: AJCC 7th Edition   Clinical: Stage IIA (T3, N0, M0) signed by [name], MD on 06/13/2014  4:37 PM   Summary: Stage IIA (T3, N0, M0)       Malignant neoplasm of head of pancreas   06/05/2014 Imaging  MRI abdomen showed 2.2 cm lesion in the pancreatic head obstructing the common bile duct and the pancreatic duct is highly concerning for pancreatic adenocarcinoma.  There is moderate intra and extrahepatic biliary duct dilatation.    06/05/2014 Tumor Marker CA 19-9 is elevated at 1234   06/06/2014 Imaging Ct scan of chest showed 2 lung nodules, likely benign   06/07/2014 Procedure He had ERCP and stent placement   06/07/2014 Procedure He had endoscopic ultrasound with  fine needle aspiration.   06/07/2014 Pathology Results Accession: [id number] FNA is positive for pancreatic adenoca   06/14/2014 Surgery He has port placement   06/25/2014 - 08/01/2014 Chemotherapy He received neoadjuvant chemotherapy with Gemzar   06/25/2014 - 08/06/2014 Radiation Therapy he received neoadjuvant radiation: 50.4 Gy   08/13/2014 - 08/23/2014 Hospital Admission The patient was admitted to the hospital related to uncontrolled nausea, vomiting and severe epigastric pain and was found to have significant gastric ulcer, resolved with conservative management and total parenteral nutrition   09/06/2014 Imaging Repeat CT scan show enlarging lung nodule, suspicious for early metastatic disease.   10/18/2014 Imaging CT scan of the chest, abdomen and pelvis show metastatic disease in the lung and liver.   10/18/2014 Tumor Marker CA-19-9 at 1745   10/22/2014 -  12/03/2014 Chemotherapy The patient is placed on modified FOLFOX chemotherapy   11/05/2014 Tumor Marker Ca19-9 at 2447   12/14/2014 Imaging CT scan showed disease progression in the liver   12/14/2014 Tumor Marker CA 19-9 at [place]: Please see below for problem oriented charting. He is seen with review of test result. He tolerated last treatment well. Denies any mucositis nausea, vomiting or diarrhea.  REVIEW OF SYSTEMS:   Constitutional: Denies fevers, chills or abnormal weight loss Eyes: Denies blurriness of vision Ears, nose, mouth, throat, and face: Denies mucositis or sore throat Respiratory: Denies cough, dyspnea or wheezes Cardiovascular: Denies palpitation, chest discomfort or lower extremity swelling Gastrointestinal:  Denies nausea, heartburn or change in bowel habits Skin: Denies abnormal skin rashes Lymphatics: Denies new lymphadenopathy or easy bruising Neurological:Denies numbness, tingling or new weaknesses Behavioral/Psych: Mood is stable, no new changes  All other systems were reviewed with the patient and are negative.  I have reviewed the past medical history, past surgical history, social history and family history with the patient and they are unchanged from previous note.  ALLERGIES:  is allergic to ace inhibitors and ciprofloxacin.  MEDICATIONS:  Current Outpatient Prescriptions  Medication Sig Dispense Refill  . aspirin EC 81 MG tablet Take 81 mg by mouth daily.    . B Complex-C (SUPER B COMPLEX/VITAMIN C PO) Take 1 tablet by mouth daily.     . cholecalciferol (VITAMIN D) 1000 UNITS tablet Take 1,000 Units by mouth daily.    . dasatinib (SPRYCEL) 50 MG tablet Take 50 mg by mouth at bedtime.    Marland Kitchen  diphenhydrAMINE (BENADRYL) 25 mg capsule Take 25-50 mg by mouth every 6 (six) hours as needed for itching.     . hydrocortisone cream 1 % Apply 1 application topically as needed for itching.    Marland Kitchen HYDROmorphone (DILAUDID) 4 MG tablet Take 4 mg by  mouth every 4 (four) hours as needed for severe pain (pain).    . insulin glargine (LANTUS) 100 UNIT/ML injection Inject 26 Units into the skin at bedtime.     Marland Kitchen lactulose (CHRONULAC) 10 GM/15ML solution Take 10 g by mouth 2 (two) times daily as needed for moderate constipation.     Marland Kitchen losartan (COZAAR) 50 MG tablet Take 50 mg by mouth daily.  11  . metFORMIN (GLUCOPHAGE) 1000 MG tablet Take 1,000 mg by mouth 2 (two) times daily with a meal.    . pantoprazole (PROTONIX) 40 MG tablet Take 1 tablet (40 mg total) by mouth daily. 60 tablet 6  . promethazine (PHENERGAN) 25 MG tablet Take 25 mg by mouth every 6 (six) hours as needed for nausea or vomiting (nausea).    . sennosides-docusate sodium (SENOKOT-S) 8.6-50 MG tablet Take 2 tablets by mouth daily.     . simvastatin (ZOCOR) 20 MG tablet Take 20 mg by mouth every evening.    . vitamin B-12 (CYANOCOBALAMIN) 1000 MCG tablet Take 1,000 mcg by mouth daily.     No current facility-administered medications for this visit.   Facility-Administered Medications Ordered in Other Visits  Medication Dose Route Frequency Provider Last Rate Last Dose  . 0.9 %  sodium chloride infusion   Intravenous Once [name], MD      . dexamethasone (DECADRON) injection 10 mg  10 mg Intravenous Once [name], MD      . Gemcitabine HCl (GEMZAR) 1,824 mg in sodium chloride 0.9 % 100 mL chemo infusion  800 mg/m2 (Treatment Plan Actual) Intravenous Once [name], MD      . heparin lock flush 100 unit/mL  500 Units Intravenous Once [name], MD      . ondansetron (ZOFRAN) IVPB 8 mg  8 mg Intravenous Once [name], MD      . PACLitaxel-protein bound (ABRAXANE) chemo infusion 225 mg  100 mg/m2 (Treatment Plan Actual) Intravenous Once [name], MD      . sodium chloride 0.9 % injection 10 mL  10 mL Intravenous PRN [name], MD      . sodium chloride 0.9 % injection 10 mL  10 mL Intracatheter PRN [name], MD        PHYSICAL EXAMINATION: ECOG PERFORMANCE STATUS:  1 - Symptomatic but completely ambulatory  Filed Vitals:   12/17/14 0827  BP: 151/70  Pulse: 96  Temp: 98 F (36.7 C)  Resp: 18   Filed Weights   12/17/14 0827  Weight: 222 lb 3.2 oz (100.789 kg)    GENERAL:alert, no distress and comfortable SKIN: skin color, texture, turgor are normal, no rashes or significant lesions EYES: normal, Conjunctiva are pink and non-injected, sclera clear Musculoskeletal:no cyanosis of digits and no clubbing  NEURO: alert & oriented x 3 with fluent speech, no focal motor/sensory deficits  LABORATORY DATA:  I have reviewed the data as listed    Component Value Date/Time   NA 142 12/14/2014 0829   NA 139 08/23/2014 0422   K 4.1 12/14/2014 0829   K 4.1 08/23/2014 0422   CL 102 08/23/2014 0422   CL 107 04/27/2013 0926   CO2 25 12/14/2014 0829   CO2 28  08/23/2014 0422   GLUCOSE 115 12/14/2014 0829   GLUCOSE 190* 08/23/2014 0422   GLUCOSE 212* 04/27/2013 0926   BUN 13.4 12/14/2014 0829   BUN 13 08/23/2014 0422   CREATININE 1.0 12/14/2014 0829   CREATININE 1.03 08/23/2014 0422   CALCIUM 9.9 12/14/2014 0829   CALCIUM 9.5 08/23/2014 0422   PROT 7.1 12/14/2014 0829   PROT 5.7* 08/23/2014 0422   ALBUMIN 3.4* 12/14/2014 0829   ALBUMIN 2.7* 08/23/2014 0422   AST 40* 12/14/2014 0829   AST 20 08/23/2014 0422   ALT 25 12/14/2014 0829   ALT 16 08/23/2014 0422   ALKPHOS 274* 12/14/2014 0829   ALKPHOS 187* 08/23/2014 0422   BILITOT 0.53 12/14/2014 0829   BILITOT 0.4 08/23/2014 0422   GFRNONAA 71* 08/23/2014 0422   GFRAA 82* 08/23/2014 0422    No results found for: SPEP, UPEP  Lab Results  Component Value Date   WBC 5.6 12/14/2014   NEUTROABS 3.3 12/14/2014   HGB 12.0* 12/14/2014   HCT 36.1* 12/14/2014   MCV 94.3 12/14/2014   PLT 203 12/14/2014      Chemistry      Component Value Date/Time   NA 142 12/14/2014 0829   NA 139 08/23/2014 0422   K 4.1 12/14/2014 0829   K 4.1 08/23/2014 0422   CL 102 08/23/2014 0422   CL 107 04/27/2013  0926   CO2 25 12/14/2014 0829   CO2 28 08/23/2014 0422   BUN 13.4 12/14/2014 0829   BUN 13 08/23/2014 0422   CREATININE 1.0 12/14/2014 0829   CREATININE 1.03 08/23/2014 0422      Component Value Date/Time   CALCIUM 9.9 12/14/2014 0829   CALCIUM 9.5 08/23/2014 0422   ALKPHOS 274* 12/14/2014 0829   ALKPHOS 187* 08/23/2014 0422   AST 40* 12/14/2014 0829   AST 20 08/23/2014 0422   ALT 25 12/14/2014 0829   ALT 16 08/23/2014 0422   BILITOT 0.53 12/14/2014 0829   BILITOT 0.4 08/23/2014 0422       RADIOGRAPHIC STUDIES:reviewed the imaging with the patient and his wife I have personally reviewed the radiological images as listed and agreed with the findings in the report.   ASSESSMENT & PLAN:  Malignant neoplasm of head of pancreas We reviewed the NCCN guidelines. He has disease progression. I have discussed this case with another GI oncologist. We discussed the role of chemotherapy. The intent is for palliative.  We discussed some of the risks, benefits, side-effects of Abraxane with gemcitabine. I will reduce gemcitabine at 800 mg/m and Abraxane at 100 mg/m to be given on day 1 and day 8, rest day 15 along with Neulasta support   Some of the short term side-effects included, though not limited to, risk of severe allergic reaction, fatigue, weight loss, pancytopenia, life-threatening infections, need for transfusions of blood products, nausea, vomiting, change in bowel habits, loss of hair, admission to hospital for various reasons, and risks of death.   Long term side-effects are also discussed including risks of infertility, permanent damage to nerve function, chronic fatigue, and rare secondary malignancy including bone marrow disorders.   The patient is aware that the response rates discussed earlier is not guaranteed.  After a long discussion, patient made an informed decision to proceed with the prescribed plan of care and went ahead to sign the consent form today.   Patient  education material was dispensed.    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 The patient denies recent  history of bleeding such as epistaxis, hematuria or hematochezia. [name] I will observe for now.     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 His CBC were within normal limits   Elevated liver enzymes This is likely related to liver metastasis and fatty liver disease. We will continue with dose adjustment as above.    Orders Placed This Encounter  Procedures  . Cancer antigen 19-9    Standing Status: Standing     Number of Occurrences: 9     Standing Expiration Date: 12/18/2015  . Comprehensive metabolic panel    Standing Status: Future     Number of Occurrences:      Standing Expiration Date: 12/18/2015   All questions were answered. The patient knows to call the clinic with any problems, questions or concerns. No barriers to learning was detected. I spent 30 minutes counseling the patient face to face. The total time spent in the appointment was 40 minutes and more than 50% was on counseling and review of test results     [facility], Tarell Schollmeyer, MD 12/17/2014 9:40 AM

## 2014-12-17 NOTE — Assessment & Plan Note (Signed)
We reviewed the NCCN guidelines. He has disease progression. I have discussed this case with another GI oncologist. We discussed the role of chemotherapy. The intent is for palliative.  We discussed some of the risks, benefits, side-effects of Abraxane with gemcitabine. I will reduce gemcitabine at 800 mg/m and Abraxane at 100 mg/m to be given on day 1 and day 8, rest day 15 along with Neulasta support   Some of the short term side-effects included, though not limited to, risk of severe allergic reaction, fatigue, weight loss, pancytopenia, life-threatening infections, need for transfusions of blood products, nausea, vomiting, change in bowel habits, loss of hair, admission to hospital for various reasons, and risks of death.   Long term side-effects are also discussed including risks of infertility, permanent damage to nerve function, chronic fatigue, and rare secondary malignancy including bone marrow disorders.   The patient is aware that the response rates discussed earlier is not guaranteed.  After a long discussion, patient made an informed decision to proceed with the prescribed plan of care and went ahead to sign the consent form today.   Patient education material was dispensed.

## 2014-12-17 NOTE — Patient Instructions (Signed)
[place] Discharge Instructions for Patients Receiving Chemotherapy  Today you received the following chemotherapy agents Abraxane/Gemzar.   To help prevent nausea and vomiting after your treatment, we encourage you to take your nausea medication as directed.    If you develop nausea and vomiting that is not controlled by your nausea medication, call the clinic.   BELOW ARE SYMPTOMS THAT SHOULD BE REPORTED IMMEDIATELY:  *FEVER GREATER THAN 100.5 F  *CHILLS WITH OR WITHOUT FEVER  NAUSEA AND VOMITING THAT IS NOT CONTROLLED WITH YOUR NAUSEA MEDICATION  *UNUSUAL SHORTNESS OF BREATH  *UNUSUAL BRUISING OR BLEEDING  TENDERNESS IN MOUTH AND THROAT WITH OR WITHOUT PRESENCE OF ULCERS  *URINARY PROBLEMS  *BOWEL PROBLEMS  UNUSUAL RASH Items with * indicate a potential emergency and should be followed up as soon as possible.  Feel free to call the clinic you have any questions or concerns. The clinic phone number is [phone].

## 2014-12-17 NOTE — Assessment & Plan Note (Signed)
This is likely due to recent treatment. The patient denies recent history of bleeding such as epistaxis, hematuria or hematochezia. [name] I will observe for now.    

## 2014-12-17 NOTE — Assessment & Plan Note (Signed)
He will continue on dasatinib for now. His CBC were within normal limits

## 2014-12-19 ENCOUNTER — Telehealth: Payer: Self-pay | Admitting: *Deleted

## 2014-12-19 NOTE — Telephone Encounter (Signed)
Spoke with patient informed him of message below. To call us if has further problems

## 2014-12-19 NOTE — Telephone Encounter (Signed)
-----   Message from [name], MD sent at 12/19/2014 12:34 PM EST ----- Regarding: FW: Aches after abraxane/gemzar OK to take Dilaudid for severe pain and tylenol for mild pain ----- Message -----    From: Christa See, RN    Sent: 12/19/2014  12:23 PM      To: [name], MD Subject: Aches after abraxane/gemzar                    Patient's wife, [name], called stating last night patient's shoulder starting hurting and now this morning he is aching from his shoulders to his feet. Denies any other symptoms.  Had abraxane/gemzar on 12/17/14. Please advise & I can call patient back.   She said all he has taken so far is tylenol. He does has some dilaudid at home to take but didn't want to take it without asking.   Thanks, Clinical biochemist

## 2014-12-19 NOTE — Telephone Encounter (Signed)
Per staff message and POF I have scheduled appts. Advised scheduler of appts. JMW  

## 2014-12-24 ENCOUNTER — Other Ambulatory Visit: Payer: Medicare Other

## 2014-12-24 ENCOUNTER — Other Ambulatory Visit: Payer: Self-pay | Admitting: *Deleted

## 2014-12-24 ENCOUNTER — Other Ambulatory Visit (HOSPITAL_BASED_OUTPATIENT_CLINIC_OR_DEPARTMENT_OTHER): Payer: Medicare Other

## 2014-12-24 ENCOUNTER — Ambulatory Visit (HOSPITAL_BASED_OUTPATIENT_CLINIC_OR_DEPARTMENT_OTHER): Payer: Medicare Other

## 2014-12-24 ENCOUNTER — Ambulatory Visit: Payer: Medicare Other

## 2014-12-24 DIAGNOSIS — C250 Malignant neoplasm of head of pancreas: Secondary | ICD-10-CM

## 2014-12-24 DIAGNOSIS — Z5111 Encounter for antineoplastic chemotherapy: Secondary | ICD-10-CM

## 2014-12-24 LAB — COMPREHENSIVE METABOLIC PANEL (CC13)
ALT: 22 U/L (ref 0–55)
ANION GAP: 15 meq/L — AB (ref 3–11)
AST: 34 U/L (ref 5–34)
Albumin: 3.2 g/dL — ABNORMAL LOW (ref 3.5–5.0)
Alkaline Phosphatase: 228 U/L — ABNORMAL HIGH (ref 40–150)
BILIRUBIN TOTAL: 0.47 mg/dL (ref 0.20–1.20)
BUN: 10 mg/dL (ref 7.0–26.0)
CALCIUM: 9.2 mg/dL (ref 8.4–10.4)
CO2: 19 mEq/L — ABNORMAL LOW (ref 22–29)
Chloride: 108 mEq/L (ref 98–109)
Creatinine: 1.1 mg/dL (ref 0.7–1.3)
EGFR: 67 mL/min/{1.73_m2} — ABNORMAL LOW (ref >90)
GLUCOSE: 224 mg/dL — AB (ref 70–140)
POTASSIUM: 3.8 meq/L (ref 3.5–5.1)
SODIUM: 142 meq/L (ref 136–145)
TOTAL PROTEIN: 5.9 g/dL — AB (ref 6.4–8.3)

## 2014-12-24 LAB — CBC WITH DIFFERENTIAL/PLATELET
BASO%: 1.2 % (ref 0.0–2.0)
Basophils Absolute: 0 10*3/uL (ref 0.0–0.1)
EOS ABS: 0.1 10*3/uL (ref 0.0–0.5)
EOS%: 1.9 % (ref 0.0–7.0)
HEMATOCRIT: 33.9 % — AB (ref 38.4–49.9)
HEMOGLOBIN: 10.9 g/dL — AB (ref 13.0–17.1)
LYMPH%: 26.2 % (ref 14.0–49.0)
MCH: 31.4 pg (ref 27.2–33.4)
MCHC: 32.3 g/dL (ref 32.0–36.0)
MCV: 97.3 fL (ref 79.3–98.0)
MONO#: 0.4 10*3/uL (ref 0.1–0.9)
MONO%: 14.8 % — AB (ref 0.0–14.0)
NEUT%: 55.9 % (ref 39.0–75.0)
NEUTROS ABS: 1.6 10*3/uL (ref 1.5–6.5)
Platelets: 114 10*3/uL — ABNORMAL LOW (ref 140–400)
RBC: 3.48 10*6/uL — AB (ref 4.20–5.82)
RDW: 18.4 % — ABNORMAL HIGH (ref 11.0–14.6)
WBC: 2.8 10*3/uL — AB (ref 4.0–10.3)
lymph#: 0.7 10*3/uL — ABNORMAL LOW (ref 0.9–3.3)

## 2014-12-24 MED ORDER — HEPARIN SOD (PORK) LOCK FLUSH 100 UNIT/ML IV SOLN
500.0000 [IU] | Freq: Once | INTRAVENOUS | Status: AC | PRN
  Administered 2014-12-24: 500 [IU]
  Filled 2014-12-24: qty 5

## 2014-12-24 MED ORDER — ONDANSETRON 8 MG/50ML IVPB (CHCC)
8.0000 mg | Freq: Once | INTRAVENOUS | Status: AC
  Administered 2014-12-24: 8 mg via INTRAVENOUS

## 2014-12-24 MED ORDER — SODIUM CHLORIDE 0.9 % IJ SOLN
10.0000 mL | INTRAMUSCULAR | Status: DC | PRN
  Administered 2014-12-24: 10 mL via INTRAVENOUS
  Filled 2014-12-24: qty 10

## 2014-12-24 MED ORDER — ONDANSETRON 8 MG/NS 50 ML IVPB
INTRAVENOUS | Status: AC
  Filled 2014-12-24: qty 8

## 2014-12-24 MED ORDER — SODIUM CHLORIDE 0.9 % IV SOLN
800.0000 mg/m2 | Freq: Once | INTRAVENOUS | Status: AC
  Administered 2014-12-24: 1824 mg via INTRAVENOUS
  Filled 2014-12-24: qty 47.97

## 2014-12-24 MED ORDER — SODIUM CHLORIDE 0.9 % IJ SOLN
10.0000 mL | INTRAMUSCULAR | Status: DC | PRN
  Administered 2014-12-24: 10 mL
  Filled 2014-12-24: qty 10

## 2014-12-24 MED ORDER — PACLITAXEL PROTEIN-BOUND CHEMO INJECTION 100 MG
100.0000 mg/m2 | Freq: Once | INTRAVENOUS | Status: AC
  Administered 2014-12-24: 225 mg via INTRAVENOUS
  Filled 2014-12-24: qty 45

## 2014-12-24 MED ORDER — DEXAMETHASONE SODIUM PHOSPHATE 10 MG/ML IJ SOLN
10.0000 mg | Freq: Once | INTRAMUSCULAR | Status: AC
  Administered 2014-12-24: 10 mg via INTRAVENOUS

## 2014-12-24 MED ORDER — DEXAMETHASONE SODIUM PHOSPHATE 10 MG/ML IJ SOLN
INTRAMUSCULAR | Status: AC
  Filled 2014-12-24: qty 1

## 2014-12-24 MED ORDER — SODIUM CHLORIDE 0.9 % IV SOLN
Freq: Once | INTRAVENOUS | Status: AC
  Administered 2014-12-24: 15:00:00 via INTRAVENOUS

## 2014-12-24 NOTE — Patient Instructions (Signed)
[place] Discharge Instructions for Patients Receiving Chemotherapy  Today you received the following chemotherapy agents: Abraxane and Gemzar.  To help prevent nausea and vomiting after your treatment, we encourage you to take your nausea medication as prescribed.   If you develop nausea and vomiting that is not controlled by your nausea medication, call the clinic.   BELOW ARE SYMPTOMS THAT SHOULD BE REPORTED IMMEDIATELY:  *FEVER GREATER THAN 100.5 F  *CHILLS WITH OR WITHOUT FEVER  NAUSEA AND VOMITING THAT IS NOT CONTROLLED WITH YOUR NAUSEA MEDICATION  *UNUSUAL SHORTNESS OF BREATH  *UNUSUAL BRUISING OR BLEEDING  TENDERNESS IN MOUTH AND THROAT WITH OR WITHOUT PRESENCE OF ULCERS  *URINARY PROBLEMS  *BOWEL PROBLEMS  UNUSUAL RASH Items with * indicate a potential emergency and should be followed up as soon as possible.  Feel free to call the clinic you have any questions or concerns. The clinic phone number is [phone].

## 2014-12-24 NOTE — Progress Notes (Signed)
Dr. Burr Medico reviewed pt's labs today and aware of pt reports 2 episodes of diarrhea today, okay to proceed with treatment.

## 2014-12-24 NOTE — Patient Instructions (Signed)

## 2014-12-25 ENCOUNTER — Ambulatory Visit: Payer: Medicare Other

## 2014-12-25 LAB — CANCER ANTIGEN 19-9: CA 19-9: 1387.3 U/mL — ABNORMAL HIGH (ref <35.0)

## 2014-12-26 ENCOUNTER — Other Ambulatory Visit: Payer: Self-pay

## 2014-12-26 ENCOUNTER — Other Ambulatory Visit: Payer: Medicare Other

## 2014-12-26 ENCOUNTER — Encounter: Payer: Medicare Other | Admitting: Nurse Practitioner

## 2014-12-26 ENCOUNTER — Inpatient Hospital Stay (HOSPITAL_COMMUNITY)
Admission: EM | Admit: 2014-12-26 | Discharge: 2014-12-29 | DRG: 871 | Disposition: A | Discharge status: Home / Self Care | Payer: Medicare Other | Attending: Internal Medicine | Admitting: Internal Medicine

## 2014-12-26 ENCOUNTER — Telehealth: Payer: Self-pay | Admitting: *Deleted

## 2014-12-26 ENCOUNTER — Emergency Department (HOSPITAL_COMMUNITY): Payer: Medicare Other

## 2014-12-26 ENCOUNTER — Ambulatory Visit (HOSPITAL_BASED_OUTPATIENT_CLINIC_OR_DEPARTMENT_OTHER): Payer: Medicare Other

## 2014-12-26 ENCOUNTER — Encounter (HOSPITAL_COMMUNITY): Payer: Self-pay

## 2014-12-26 ENCOUNTER — Other Ambulatory Visit: Payer: Self-pay | Admitting: Nurse Practitioner

## 2014-12-26 ENCOUNTER — Telehealth: Payer: Self-pay | Admitting: Nurse Practitioner

## 2014-12-26 DIAGNOSIS — R5081 Fever presenting with conditions classified elsewhere: Secondary | ICD-10-CM | POA: Diagnosis present

## 2014-12-26 DIAGNOSIS — Z79899 Other long term (current) drug therapy: Secondary | ICD-10-CM | POA: Diagnosis not present

## 2014-12-26 DIAGNOSIS — Z7982 Long term (current) use of aspirin: Secondary | ICD-10-CM

## 2014-12-26 DIAGNOSIS — Z5189 Encounter for other specified aftercare: Secondary | ICD-10-CM

## 2014-12-26 DIAGNOSIS — C787 Secondary malignant neoplasm of liver and intrahepatic bile duct: Secondary | ICD-10-CM | POA: Diagnosis present

## 2014-12-26 DIAGNOSIS — Z9049 Acquired absence of other specified parts of digestive tract: Secondary | ICD-10-CM | POA: Diagnosis present

## 2014-12-26 DIAGNOSIS — C9211 Chronic myeloid leukemia, BCR/ABL-positive, in remission: Secondary | ICD-10-CM | POA: Diagnosis present

## 2014-12-26 DIAGNOSIS — D6959 Other secondary thrombocytopenia: Secondary | ICD-10-CM | POA: Diagnosis present

## 2014-12-26 DIAGNOSIS — E1122 Type 2 diabetes mellitus with diabetic chronic kidney disease: Secondary | ICD-10-CM | POA: Diagnosis present

## 2014-12-26 DIAGNOSIS — I129 Hypertensive chronic kidney disease with stage 1 through stage 4 chronic kidney disease, or unspecified chronic kidney disease: Secondary | ICD-10-CM | POA: Diagnosis present

## 2014-12-26 DIAGNOSIS — Z85828 Personal history of other malignant neoplasm of skin: Secondary | ICD-10-CM | POA: Diagnosis not present

## 2014-12-26 DIAGNOSIS — C250 Malignant neoplasm of head of pancreas: Secondary | ICD-10-CM

## 2014-12-26 DIAGNOSIS — K1231 Oral mucositis (ulcerative) due to antineoplastic therapy: Secondary | ICD-10-CM | POA: Diagnosis present

## 2014-12-26 DIAGNOSIS — Z794 Long term (current) use of insulin: Secondary | ICD-10-CM

## 2014-12-26 DIAGNOSIS — N183 Chronic kidney disease, stage 3 unspecified: Secondary | ICD-10-CM | POA: Diagnosis present

## 2014-12-26 DIAGNOSIS — Y95 Nosocomial condition: Secondary | ICD-10-CM | POA: Diagnosis present

## 2014-12-26 DIAGNOSIS — R509 Fever, unspecified: Secondary | ICD-10-CM | POA: Diagnosis not present

## 2014-12-26 DIAGNOSIS — G4733 Obstructive sleep apnea (adult) (pediatric): Secondary | ICD-10-CM | POA: Diagnosis present

## 2014-12-26 DIAGNOSIS — Z87891 Personal history of nicotine dependence: Secondary | ICD-10-CM

## 2014-12-26 DIAGNOSIS — K219 Gastro-esophageal reflux disease without esophagitis: Secondary | ICD-10-CM | POA: Diagnosis present

## 2014-12-26 DIAGNOSIS — D709 Neutropenia, unspecified: Secondary | ICD-10-CM | POA: Diagnosis present

## 2014-12-26 DIAGNOSIS — T451X5A Adverse effect of antineoplastic and immunosuppressive drugs, initial encounter: Secondary | ICD-10-CM | POA: Diagnosis present

## 2014-12-26 DIAGNOSIS — Z809 Family history of malignant neoplasm, unspecified: Secondary | ICD-10-CM | POA: Diagnosis not present

## 2014-12-26 DIAGNOSIS — R300 Dysuria: Secondary | ICD-10-CM

## 2014-12-26 DIAGNOSIS — T50905A Adverse effect of unspecified drugs, medicaments and biological substances, initial encounter: Secondary | ICD-10-CM

## 2014-12-26 DIAGNOSIS — D701 Agranulocytosis secondary to cancer chemotherapy: Secondary | ICD-10-CM | POA: Diagnosis present

## 2014-12-26 DIAGNOSIS — J189 Pneumonia, unspecified organism: Secondary | ICD-10-CM

## 2014-12-26 DIAGNOSIS — E1140 Type 2 diabetes mellitus with diabetic neuropathy, unspecified: Secondary | ICD-10-CM | POA: Diagnosis present

## 2014-12-26 DIAGNOSIS — R1030 Lower abdominal pain, unspecified: Secondary | ICD-10-CM

## 2014-12-26 DIAGNOSIS — I10 Essential (primary) hypertension: Secondary | ICD-10-CM | POA: Diagnosis present

## 2014-12-26 DIAGNOSIS — A419 Sepsis, unspecified organism: Principal | ICD-10-CM

## 2014-12-26 LAB — CBC WITH DIFFERENTIAL/PLATELET
BASOS ABS: 0 10*3/uL (ref 0.0–0.1)
BASOS PCT: 0 % (ref 0–1)
EOS ABS: 0 10*3/uL (ref 0.0–0.7)
EOS PCT: 1 % (ref 0–5)
HCT: 36 % — ABNORMAL LOW (ref 39.0–52.0)
Hemoglobin: 11.9 g/dL — ABNORMAL LOW (ref 13.0–17.0)
Lymphocytes Relative: 3 % — ABNORMAL LOW (ref 12–46)
Lymphs Abs: 0 10*3/uL — ABNORMAL LOW (ref 0.7–4.0)
MCH: 31.9 pg (ref 26.0–34.0)
MCHC: 33.1 g/dL (ref 30.0–36.0)
MCV: 96.5 fL (ref 78.0–100.0)
Monocytes Absolute: 0 10*3/uL — ABNORMAL LOW (ref 0.1–1.0)
Monocytes Relative: 1 % — ABNORMAL LOW (ref 3–12)
Neutro Abs: 1.6 10*3/uL — ABNORMAL LOW (ref 1.7–7.7)
Neutrophils Relative %: 96 % — ABNORMAL HIGH (ref 43–77)
PLATELETS: 69 10*3/uL — AB (ref 150–400)
RBC: 3.73 MIL/uL — ABNORMAL LOW (ref 4.22–5.81)
RDW: 17.7 % — AB (ref 11.5–15.5)
WBC: 1.6 10*3/uL — AB (ref 4.0–10.5)

## 2014-12-26 LAB — URINALYSIS, ROUTINE W REFLEX MICROSCOPIC
Bilirubin Urine: NEGATIVE
Glucose, UA: NEGATIVE mg/dL
HGB URINE DIPSTICK: NEGATIVE
Ketones, ur: NEGATIVE mg/dL
LEUKOCYTES UA: NEGATIVE
Nitrite: NEGATIVE
Protein, ur: 30 mg/dL — AB
Specific Gravity, Urine: 1.019 (ref 1.005–1.030)
Urobilinogen, UA: 0.2 mg/dL (ref 0.0–1.0)
pH: 5 (ref 5.0–8.0)

## 2014-12-26 LAB — COMPREHENSIVE METABOLIC PANEL
ALT: 26 U/L (ref 0–53)
AST: 43 U/L — AB (ref 0–37)
Albumin: 3.3 g/dL — ABNORMAL LOW (ref 3.5–5.2)
Alkaline Phosphatase: 182 U/L — ABNORMAL HIGH (ref 39–117)
Anion gap: 8 (ref 5–15)
BUN: 18 mg/dL (ref 6–23)
CHLORIDE: 109 mmol/L (ref 96–112)
CO2: 24 mmol/L (ref 19–32)
Calcium: 9.3 mg/dL (ref 8.4–10.5)
Creatinine, Ser: 0.99 mg/dL (ref 0.50–1.35)
GFR calc Af Amer: >90 mL/min (ref >90)
GFR, EST NON AFRICAN AMERICAN: 80 mL/min — AB (ref >90)
Glucose, Bld: 163 mg/dL — ABNORMAL HIGH (ref 70–99)
Potassium: 3.7 mmol/L (ref 3.5–5.1)
SODIUM: 141 mmol/L (ref 135–145)
Total Bilirubin: 0.9 mg/dL (ref 0.3–1.2)
Total Protein: 6.4 g/dL (ref 6.0–8.3)

## 2014-12-26 LAB — URINE MICROSCOPIC-ADD ON

## 2014-12-26 LAB — I-STAT CG4 LACTIC ACID, ED
Lactic Acid, Venous: 1.79 mmol/L (ref 0.5–2.0)
Lactic Acid, Venous: 0.94 mmol/L (ref 0.5–2.0)

## 2014-12-26 MED ORDER — PEGFILGRASTIM INJECTION 6 MG/0.6ML ~~LOC~~
6.0000 mg | PREFILLED_SYRINGE | Freq: Once | SUBCUTANEOUS | Status: AC
  Administered 2014-12-26: 6 mg via SUBCUTANEOUS
  Filled 2014-12-26: qty 0.6

## 2014-12-26 MED ORDER — IOHEXOL 300 MG/ML  SOLN
50.0000 mL | Freq: Once | INTRAMUSCULAR | Status: AC | PRN
  Administered 2014-12-26: 50 mL via ORAL

## 2014-12-26 MED ORDER — PIPERACILLIN-TAZOBACTAM 3.375 G IVPB
3.3750 g | Freq: Three times a day (TID) | INTRAVENOUS | Status: DC
  Administered 2014-12-27 – 2014-12-29 (×8): 3.375 g via INTRAVENOUS
  Filled 2014-12-26 (×9): qty 50

## 2014-12-26 MED ORDER — VANCOMYCIN HCL IN DEXTROSE 1-5 GM/200ML-% IV SOLN
1000.0000 mg | Freq: Three times a day (TID) | INTRAVENOUS | Status: DC
  Administered 2014-12-27 – 2014-12-28 (×5): 1000 mg via INTRAVENOUS
  Filled 2014-12-26 (×5): qty 200

## 2014-12-26 MED ORDER — SODIUM CHLORIDE 0.9 % IV BOLUS (SEPSIS)
1000.0000 mL | Freq: Once | INTRAVENOUS | Status: AC
  Administered 2014-12-26: 1000 mL via INTRAVENOUS

## 2014-12-26 MED ORDER — VANCOMYCIN HCL 10 G IV SOLR
1500.0000 mg | INTRAVENOUS | Status: AC
  Administered 2014-12-26: 1500 mg via INTRAVENOUS
  Filled 2014-12-26: qty 1500

## 2014-12-26 MED ORDER — PIPERACILLIN-TAZOBACTAM 3.375 G IVPB 30 MIN
3.3750 g | INTRAVENOUS | Status: AC
  Administered 2014-12-26: 3.375 g via INTRAVENOUS
  Filled 2014-12-26: qty 50

## 2014-12-26 MED ORDER — SODIUM CHLORIDE 0.9 % IJ SOLN
INTRAMUSCULAR | Status: AC
  Administered 2014-12-26: 10 mL
  Filled 2014-12-26: qty 10

## 2014-12-26 MED ORDER — IOHEXOL 300 MG/ML  SOLN
100.0000 mL | Freq: Once | INTRAMUSCULAR | Status: AC | PRN
  Administered 2014-12-26: 100 mL via INTRAVENOUS

## 2014-12-26 MED ORDER — SODIUM CHLORIDE 0.9 % IV BOLUS (SEPSIS)
30.0000 mL/kg | Freq: Once | INTRAVENOUS | Status: AC
  Administered 2014-12-26: 3024 mL via INTRAVENOUS

## 2014-12-26 NOTE — Progress Notes (Addendum)
ANTIBIOTIC CONSULT NOTE - INITIAL  Pharmacy Consult for:  Vancomycin, Zosyn Indication:  Sepsis  Allergies  Allergen Reactions  . Ace Inhibitors Other (See Comments)    Unknown  . Ciprofloxacin Rash    Patient Measurements: 12/26/13 - Height 74 inches   Weight: 222 lb (100.699 kg)  Vital Signs: Temp: 101.3 F (38.5 C) (02/10 1725) Temp Source: Oral (02/10 1725) BP: 135/59 mmHg (02/10 1815) Pulse Rate: 118 (02/10 1815)  Labs:  Recent Labs  12/24/14 1236 12/24/14 1237 12/26/14 1708  WBC 2.8*  --  1.6*  HGB 10.9*  --  11.9*  PLT 114*  --  69*  CREATININE  --  1.1 0.99   Estimated Creatinine Clearance: 86.7 mL/min (by C-G formula based on Cr of 0.99).    Microbiology: No results found for this or any previous visit (from the past 720 hour(s)).  Medical History: Past Medical History  Diagnosis Date  . Chronic back pain greater than 3 months duration   . Bulging discs   . Diabetes mellitus     type II; neuropathy;   . Hypertension   . High cholesterol   . Leukocytosis   . GERD (gastroesophageal reflux disease)   . Pancreatitis 2004  . Anxiety   . DDD (degenerative disc disease) 2005    lumbar spine  . History of smoking 08/01/2012  . Weight loss 08/01/2012  . Cough 08/01/2012  . Left leg pain 08/05/2012  . Chronic pancreatitis   . OSA (obstructive sleep apnea) 2010    does not use CPAP  . Nausea alone 06/13/2014  . Allergy   . Mucositis (ulcerative) due to antineoplastic therapy 07/02/2014  . Constipated 07/16/2014  . Gastric ulcer 08/23/2014  . Hx of radiation therapy 06/25/14-08/06/14    pancreas head, 50.4Gy  . 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 Pancreatic cancer 06/08/2014  . Chest pain 11/05/2014    Medications:  Pending  Assessment:  Asked to assist with antibiotic therapy -- Zosyn and Vancomycin -- for this 74 year-old male with sepsis, and immunosuppression due to chemotherapy.  The last chemotherapy  treatment with Abraxane and Gemzar was given on 12/24/14.  Neulasta 6 mg injection was administered this morning.  According to the medical record, Evan [name] developed shaking chills, nausea and vomiting, and fever this afternoon.  Goals of Therapy:   Vancomycin trough levels 15-20 mcg/ml  Eradication of infection  Plan:   Vancomycin 1500 mg IV x 1, then 1000 mg IV every 8 hours  Zosyn 3.375 grams IV over 30 minutes, then 3.375 grams IV every 8 hours, each dose infused over 4 hours.  Drug levels as needed  [employer].Ph. 12/26/2014 6:45 PM      ,

## 2014-12-26 NOTE — ED Notes (Signed)
Dr. Doutova at bedside.  

## 2014-12-26 NOTE — Telephone Encounter (Signed)
per pof to sch pt w/CB for [facility]

## 2014-12-26 NOTE — ED Provider Notes (Signed)
CSN: [id number]     Arrival date & time 12/26/14  51 History   First MD Initiated Contact with Patient 12/26/14 1638     Chief Complaint  Patient presents with  . Chemo Card   . Fever  . Emesis     (Consider location/radiation/quality/duration/timing/severity/associated sxs/prior Treatment) Patient is a 74 y.o. male presenting with vomiting.  Emesis Severity:  Moderate Duration:  1 day Timing:  Constant Progression:  Unchanged Chronicity:  New Recent urination:  Normal Relieved by:  Nothing Worsened by:  Nothing tried Ineffective treatments:  None tried Associated symptoms: abdominal pain (minimal, crampy), cough, diarrhea and fever (tmax 100.2 at home)   Risk factors comment:  Chemotherapy for pancreatic ca   Past Medical History  Diagnosis Date  . Chronic back pain greater than 3 months duration   . Bulging discs   . Diabetes mellitus     type II; neuropathy;   . Hypertension   . High cholesterol   . Leukocytosis   . GERD (gastroesophageal reflux disease)   . Pancreatitis 2004  . Anxiety   . DDD (degenerative disc disease) 2005    lumbar spine  . History of smoking 08/01/2012  . Weight loss 08/01/2012  . Cough 08/01/2012  . Left leg pain 08/05/2012  . Chronic pancreatitis   . OSA (obstructive sleep apnea) 2010    does not use CPAP  . Nausea alone 06/13/2014  . Allergy   . Mucositis (ulcerative) due to antineoplastic therapy 07/02/2014  . Constipated 07/16/2014  . Gastric ulcer 08/23/2014  . Hx of radiation therapy 06/25/14-08/06/14    pancreas head, 50.4Gy  . 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 Pancreatic cancer 06/08/2014  . Chest pain 11/05/2014   Past Surgical History  Procedure Laterality Date  . Cholecystectomy      laparoscopic  . Hernia repair      umbilical  . Skin cancer removed  2012    right ear, basal cell carcinoma.   Marland Kitchen Spinal stretching    . Colonoscopy  2012    [facility]  . Eus Left 06/07/2014   Procedure: UPPER ENDOSCOPIC ULTRASOUND (EUS) LINEAR;  Surgeon: Arta Silence, MD;  Location: [facility];  Service: Endoscopy;  Laterality: Left;  . Ercp N/A 06/07/2014    Procedure: ENDOSCOPIC RETROGRADE CHOLANGIOPANCREATOGRAPHY (ERCP);  Surgeon: Arta Silence, MD;  Location: [facility];  Service: Endoscopy;  Laterality: N/A;  . Eye surgery      bilateral cataracts with lens implant  . Cataract extraction Bilateral   . Port acath placement Left 06/14/14  . Portacath placement Left 06/14/2014    Procedure: INSERTION PORT-A-CATH;  Surgeon: Stark Klein, MD;  Location: [facility];  Service: General;  Laterality: Left;  . Esophagogastroduodenoscopy N/A 08/15/2014    Procedure: ESOPHAGOGASTRODUODENOSCOPY (EGD);  Surgeon: Lear Ng, MD;  Location: [facility];  Service: Endoscopy;  Laterality: N/A;   Family History  Problem Relation Age of Onset  . Heart attack Mother   . Cancer Mother 45    lung  . Heart attack Father   . Stroke Father    History  Substance Use Topics  . Smoking status: Former Smoker -- 1.50 packs/day for 40 years    Quit date: 01/24/2004  . Smokeless tobacco: Never Used  . Alcohol Use: No    Review of Systems  Gastrointestinal: Positive for vomiting, abdominal pain (minimal, crampy) and diarrhea.  All other systems reviewed and are negative.     Allergies  Ace inhibitors and Ciprofloxacin  Home Medications   Prior to Admission medications   Medication Sig Start Date End Date Taking? Authorizing Provider  aspirin EC 81 MG tablet Take 81 mg by mouth daily.   Yes Historical Provider, MD  B Complex-C (SUPER B COMPLEX/VITAMIN C PO) Take 1 tablet by mouth daily.    Yes Historical Provider, MD  cholecalciferol (VITAMIN D) 1000 UNITS tablet Take 1,000 Units by mouth daily.   Yes Historical Provider, MD  dasatinib (SPRYCEL) 50 MG tablet Take 50 mg by mouth daily.  01/15/14  Yes [name], MD  diphenhydrAMINE (BENADRYL) 25 mg capsule Take 25-50 mg by mouth  every 6 (six) hours as needed for itching (itching).    Yes Historical Provider, MD  hydrocortisone cream 1 % Apply 1 application topically daily as needed for itching (itching).    Yes Historical Provider, MD  HYDROmorphone (DILAUDID) 4 MG tablet Take 4 mg by mouth every 4 (four) hours as needed for severe pain (pain).   Yes Historical Provider, MD  insulin glargine (LANTUS) 100 UNIT/ML injection Inject 26 Units into the skin at bedtime.  08/09/14  Yes Modena Jansky, MD  losartan (COZAAR) 50 MG tablet Take 50 mg by mouth daily. 12/06/14  Yes Historical Provider, MD  metFORMIN (GLUCOPHAGE) 1000 MG tablet Take 1,000 mg by mouth 2 (two) times daily with a meal.   Yes Historical Provider, MD  pantoprazole (PROTONIX) 40 MG tablet Take 1 tablet (40 mg total) by mouth daily. 08/23/14  Yes [name], MD  sennosides-docusate sodium (SENOKOT-S) 8.6-50 MG tablet Take 2 tablets by mouth at bedtime.    Yes Historical Provider, MD  simvastatin (ZOCOR) 20 MG tablet Take 20 mg by mouth daily.    Yes Historical Provider, MD  vitamin B-12 (CYANOCOBALAMIN) 1000 MCG tablet Take 1,000 mcg by mouth daily.   Yes Historical Provider, MD  lactulose (CHRONULAC) 10 GM/15ML solution Take 10 g by mouth 2 (two) times daily as needed for moderate constipation.  06/21/14   Historical Provider, MD  promethazine (PHENERGAN) 25 MG tablet Take 25 mg by mouth every 6 (six) hours as needed for nausea or vomiting (nausea).    Historical Provider, MD   BP 110/59 mmHg  Pulse 97  Temp(Src) 99 F (37.2 C) (Oral)  Resp 15  Wt 222 lb (100.699 kg)  SpO2 99% Physical Exam  Constitutional: He is oriented to person, place, and time. He appears well-developed and well-nourished.  HENT:  Head: Normocephalic and atraumatic.  Eyes: Conjunctivae and EOM are normal.  Neck: Normal range of motion. Neck supple.  Cardiovascular: Normal rate, regular rhythm and normal heart sounds.   Pulmonary/Chest: Effort normal. No respiratory distress. He has  rales in the right lower field and the left lower field.  Abdominal: He exhibits no distension. There is tenderness in the right upper quadrant and right lower quadrant. There is no rebound and no guarding.  Musculoskeletal: Normal range of motion.  Neurological: He is alert and oriented to person, place, and time.  Skin: Skin is warm and dry.  Vitals reviewed.   ED Course  Procedures (including critical care time) Labs Review Labs Reviewed  CBC WITH DIFFERENTIAL/PLATELET - Abnormal; Notable for the following:    WBC 1.6 (*)    RBC 3.73 (*)    Hemoglobin 11.9 (*)    HCT 36.0 (*)    RDW 17.7 (*)    Platelets 69 (*)    Neutrophils Relative % 96 (*)  Neutro Abs 1.6 (*)    Lymphocytes Relative 3 (*)    Lymphs Abs 0.0 (*)    Monocytes Relative 1 (*)    Monocytes Absolute 0.0 (*)    All other components within normal limits  COMPREHENSIVE METABOLIC PANEL - Abnormal; Notable for the following:    Glucose, Bld 163 (*)    Albumin 3.3 (*)    AST 43 (*)    Alkaline Phosphatase 182 (*)    GFR calc non Af Amer 80 (*)    All other components within normal limits  URINALYSIS, ROUTINE W REFLEX MICROSCOPIC - Abnormal; Notable for the following:    Protein, ur 30 (*)    All other components within normal limits  URINE MICROSCOPIC-ADD ON - Abnormal; Notable for the following:    Crystals URIC ACID CRYSTALS (*)    All other components within normal limits  CULTURE, BLOOD (ROUTINE X 2)  CULTURE, BLOOD (ROUTINE X 2)  URINE CULTURE  I-STAT CG4 LACTIC ACID, ED  I-STAT CG4 LACTIC ACID, ED  I-STAT CG4 LACTIC ACID, ED    Imaging Review Ct Abdomen Pelvis W Contrast  12/26/2014   CLINICAL DATA:  Fever, nausea and vomiting. History of metastatic pancreatic carcinoma.  EXAM: CT ABDOMEN AND PELVIS WITH CONTRAST  TECHNIQUE: Multidetector CT imaging of the abdomen and pelvis was performed using the standard protocol following bolus administration of intravenous contrast.  CONTRAST:  55mL OMNIPAQUE  IOHEXOL 300 MG/ML SOLN, 164mL OMNIPAQUE IOHEXOL 300 MG/ML SOLN  COMPARISON:  CT of the chest, abdomen and pelvis on 12/14/2014  FINDINGS: Visualized lung bases showed consolidation in the posterior left lower lobe with some areas of small cavitation and associated small left pleural effusion. Findings are concerning for acute pneumonia and the only abnormality present at the lung bases previously was a trace amount of pleural fluid. Similar changes are also seen at the posterior right lung base but to a lesser degree with associated trace right pleural effusion. Bilateral airspace abnormalities in the lower lungs may suggest potential aspiration and correlation suggested clinically.  Multiple liver metastases are again noted in both lobes of the liver. Lesions are similar in size compared to the recent restaging CT. Metal stent in the common bile duct continues to show patency with air present in the biliary tree. No evidence of biliary obstruction.  Low-density mass in the body of the pancreas is similar and size in appearance with maximal diameter of approximately 3 cm. There is evidence of atrophy of the tail of the pancreas.  Compared to the prior study, there is some more nonspecific stranding in the retroperitoneum and also the mesentery without focal fluid collection or ascites. This is nonspecific but may relate to some relative third spacing of fluid. Bowel shows no evidence of obstruction or perforation. Stable small right adrenal nodule. Stable degenerative disc disease at L3-4. No bony metastasis identified.  IMPRESSION: 1. New pulmonary consolidation in both posterior lower lobes, left greater than right with associated small pleural effusions. Findings are consistent with pneumonia and suggest potential aspiration. 2. Similar CT appearance of metastatic disease throughout the liver and similar sized of pancreatic carcinoma. 3. Increased prominence of stranding in the retroperitoneum and mesentery. This  is nonspecific and is not associated with overt evidence of carcinomatosis or ascites. This may relate to some increased third spacing of fluid.   Electronically Signed   By: Aletta Edouard M.D.   On: 12/26/2014 21:26   Dg Chest Port 1 View  12/26/2014  CLINICAL DATA:  Fever. Currently on chemotherapy for pancreatic cancer. History of chronic myelocytic leukemia. Previous smoker.  EXAM: PORTABLE CHEST - 1 VIEW  COMPARISON:  08/13/2014  FINDINGS: Left central venous catheter is unchanged in position. Shallow inspiration. Mild increased heart size and pulmonary vascularity. No focal consolidation or edema. No blunting of costophrenic angles. No pneumothorax. Calcified aorta appear  IMPRESSION: Shallow inspiration. Mild cardiac enlargement and pulmonary vascular congestion. No edema or consolidation.   Electronically Signed   By: Lucienne Capers M.D.   On: 12/26/2014 18:17     EKG Interpretation None      MDM   Final diagnoses:  Fever  Lower abdominal pain    74 y.o. male with pertinent PMH of pancreatic cancer on chemotherapy presents with n/v/d, generalized weakness beginning today after neulasta this am.  On arrival, pt with septic vitals, code sepsis called.  Vanc/zosyn given.  Physical exam demonstrated well appearing male, with lower abd tenderness.    Wu with PNA bilaterally.  Admitted in stable condition.  I have reviewed all laboratory and imaging studies if ordered as above  1. Fever   2. Lower abdominal pain         Debby Freiberg, MD 12/26/14 [phone]

## 2014-12-26 NOTE — ED Notes (Addendum)
Urine orders pending since 1646 Urine noted in urinal on bedside table--specimen to be sent to lab

## 2014-12-26 NOTE — Patient Instructions (Signed)
Pegfilgrastim injection What is this medicine? PEGFILGRASTIM (peg fil GRA stim) is a long-acting granulocyte colony-stimulating factor that stimulates the growth of neutrophils, a type of white blood cell important in the body's fight against infection. It is used to reduce the incidence of fever and infection in patients with certain types of cancer who are receiving chemotherapy that affects the bone marrow. This medicine may be used for other purposes; ask your health care provider or pharmacist if you have questions. COMMON BRAND NAME(S): Neulasta What should I tell my health care provider before I take this medicine? They need to know if you have any of these conditions: -latex allergy -ongoing radiation therapy -sickle cell disease -skin reactions to acrylic adhesives (On-Body Injector only) -an unusual or allergic reaction to pegfilgrastim, filgrastim, other medicines, foods, dyes, or preservatives -pregnant or trying to get pregnant -breast-feeding How should I use this medicine? This medicine is for injection under the skin. If you get this medicine at home, you will be taught how to prepare and give the pre-filled syringe or how to use the On-body Injector. Refer to the patient Instructions for Use for detailed instructions. Use exactly as directed. Take your medicine at regular intervals. Do not take your medicine more often than directed. It is important that you put your used needles and syringes in a special sharps container. Do not put them in a trash can. If you do not have a sharps container, call your pharmacist or healthcare provider to get one. Talk to your pediatrician regarding the use of this medicine in children. Special care may be needed. Overdosage: If you think you have taken too much of this medicine contact a poison control center or emergency room at once. NOTE: This medicine is only for you. Do not share this medicine with others. What if I miss a dose? It is  important not to miss your dose. Call your doctor or health care professional if you miss your dose. If you miss a dose due to an On-body Injector failure or leakage, a new dose should be administered as soon as possible using a single prefilled syringe for manual use. What may interact with this medicine? Interactions have not been studied. Give your health care provider a list of all the medicines, herbs, non-prescription drugs, or dietary supplements you use. Also tell them if you smoke, drink alcohol, or use illegal drugs. Some items may interact with your medicine. This list may not describe all possible interactions. Give your health care provider a list of all the medicines, herbs, non-prescription drugs, or dietary supplements you use. Also tell them if you smoke, drink alcohol, or use illegal drugs. Some items may interact with your medicine. What should I watch for while using this medicine? You may need blood work done while you are taking this medicine. If you are going to need a MRI, CT scan, or other procedure, tell your doctor that you are using this medicine (On-Body Injector only). What side effects may I notice from receiving this medicine? Side effects that you should report to your doctor or health care professional as soon as possible: -allergic reactions like skin rash, itching or hives, swelling of the face, lips, or tongue -dizziness -fever -pain, redness, or irritation at site where injected -pinpoint red spots on the skin -shortness of breath or breathing problems -stomach or side pain, or pain at the shoulder -swelling -tiredness -trouble passing urine Side effects that usually do not require medical attention (report to your doctor   or health care professional if they continue or are bothersome): -bone pain -muscle pain This list may not describe all possible side effects. Call your doctor for medical advice about side effects. You may report side effects to FDA at  1-800-FDA-1088. Where should I keep my medicine? Keep out of the reach of children. Store pre-filled syringes in a refrigerator between 2 and 8 degrees C (36 and 46 degrees F). Do not freeze. Keep in carton to protect from light. Throw away this medicine if it is left out of the refrigerator for more than 48 hours. Throw away any unused medicine after the expiration date. NOTE: This sheet is a summary. It may not cover all possible information. If you have questions about this medicine, talk to your doctor, pharmacist, or health care provider.  2015, [employer]. (2014-02-01 16:14:05)  

## 2014-12-26 NOTE — Telephone Encounter (Signed)
Wife [name] called.  Patient had chemo late Monday and neulasta this am.  Now presents with shaking chills for the last hour, temp of 100.2 and vomiting x1.   Discussed with Selena Lesser NP.  Have pt. Come here if they can get here in next 30 min. Otherwise go to [facility] ED.  Called wife. They can be here in 30 min.  Will need wheelchair.  Her son is there now to help her get him here.  Will go to lab first and then Selena Lesser NP

## 2014-12-26 NOTE — H&P (Signed)
PCP:  [name], MD  [facility]  Chief Complaint:  fever  HPI: Evan [name] is a 74 y.o. male   has a past medical history of Chronic back pain greater than 3 months duration; Bulging discs; Diabetes mellitus; Hypertension; High cholesterol; Leukocytosis; GERD (gastroesophageal reflux disease); Pancreatitis (2004); Anxiety; DDD (degenerative disc disease) (2005); History of smoking (08/01/2012); Weight loss (08/01/2012); Cough (08/01/2012); Left leg pain (08/05/2012); Chronic pancreatitis; OSA (obstructive sleep apnea) (2010); Nausea alone (06/13/2014); Allergy; Mucositis (ulcerative) due to antineoplastic therapy (07/02/2014); Constipated (07/16/2014); Gastric ulcer (08/23/2014); radiation therapy (06/25/14-08/06/14); 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   Presented with  Patient with hx of Pancreatic cancer sp recent Chemo and was given Nuelasta today. Today he developed shaking chills and generalized weakness. He was brought to [facility] ER. Found to be febrile up to 104. AN count 1.6 In ER showing evidence of sepsis with tachycardia and fever up to 104 with increased somnolence.  His BP was noted down to 93/56. He has been complaining of some cough and had some nausea. CT of abdomen showed bilateral PNA possible aspiration? . He reports two soft stools today  That have resolved.  CT  Hospitalist was called for admission for Sepsis due to PNA  Review of Systems:    Pertinent positives include: Fevers, chills, fatigue, weight loss  nausea, vomiting, non-productive cough diarrhea  Constitutional:  No weight loss, night sweats,  HEENT:  No headaches, Difficulty swallowing,Tooth/dental problems,Sore throat,  No sneezing, itching, ear ache, nasal congestion, post nasal drip,  Cardio-vascular:  No chest pain, Orthopnea, PND, anasarca, dizziness, palpitations.no Bilateral lower extremity  swelling  GI:  No heartburn, indigestion, abdominal pain, , change in bowel habits, loss of appetite, melena, blood in stool, hematemesis Resp:  no shortness of breath at rest. No dyspnea on exertion, No excess mucus, no productive cough, No , No coughing up of blood.No change in color of mucus.No wheezing. Skin:  no rash or lesions. No jaundice GU:  no dysuria, change in color of urine, no urgency or frequency. No straining to urinate.  No flank pain.  Musculoskeletal:  No joint pain or no joint swelling. No decreased range of motion. No back pain.  Psych:  No change in mood or affect. No depression or anxiety. No memory loss.  Neuro: no localizing neurological complaints, no tingling, no weakness, no double vision, no gait abnormality, no slurred speech, no confusion  Otherwise ROS are negative except for above, 10 systems were reviewed  Past Medical History: Past Medical History  Diagnosis Date  . Chronic back pain greater than 3 months duration   . Bulging discs   . Diabetes mellitus     type II; neuropathy;   . Hypertension   . High cholesterol   . Leukocytosis   . GERD (gastroesophageal reflux disease)   . Pancreatitis 2004  . Anxiety   . DDD (degenerative disc disease) 2005    lumbar spine  . History of smoking 08/01/2012  . Weight loss 08/01/2012  . Cough 08/01/2012  . Left leg pain 08/05/2012  . Chronic pancreatitis   . OSA (obstructive sleep apnea) 2010    does not use CPAP  . Nausea alone 06/13/2014  . Allergy   . Mucositis (ulcerative) due to antineoplastic therapy 07/02/2014  . Constipated 07/16/2014  . Gastric ulcer 08/23/2014  . Hx of radiation therapy 06/25/14-08/06/14    pancreas head, 50.4Gy  . CML (chronic  myelocytic leukemia) 08/05/2012  . 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 Pancreatic cancer 06/08/2014  . Chest pain 11/05/2014   Past Surgical History  Procedure Laterality Date  . Cholecystectomy      laparoscopic  . Hernia repair      umbilical  . Skin  cancer removed  2012    right ear, basal cell carcinoma.   Marland Kitchen Spinal stretching    . Colonoscopy  2012    [facility]  . Eus Left 06/07/2014    Procedure: UPPER ENDOSCOPIC ULTRASOUND (EUS) LINEAR;  Surgeon: Arta Silence, MD;  Location: [facility] END[facility];  Service: Endoscopy;  Laterality: Left;  . Ercp N/A 06/07/2014    Procedure: ENDOSCOPIC RETROGRADE CHOLANGIOPANCREATOGRAPHY (ERCP);  Surgeon: Arta Silence, MD;  Location: [facility];  Service: Endoscopy;  Laterality: N/A;  . Eye surgery      bilateral cataracts with lens implant  . Cataract extraction Bilateral   . Port acath placement Left 06/14/14  . Portacath placement Left 06/14/2014    Procedure: INSERTION PORT-A-CATH;  Surgeon: Stark Klein, MD;  Location: [facility]S;  Service: General;  Laterality: Left;  . Esophagogastroduodenoscopy N/A 08/15/2014    Procedure: ESOPHAGOGASTRODUODENOSCOPY (EGD);  Surgeon: Lear Ng, MD;  Location: [facility];  Service: Endoscopy;  Laterality: N/A;     Medications: Prior to Admission medications   Medication Sig Start Date End Date Taking? Authorizing Provider  aspirin EC 81 MG tablet Take 81 mg by mouth daily.   Yes Historical Provider, MD  B Complex-C (SUPER B COMPLEX/VITAMIN C PO) Take 1 tablet by mouth daily.    Yes Historical Provider, MD  cholecalciferol (VITAMIN D) 1000 UNITS tablet Take 1,000 Units by mouth daily.   Yes Historical Provider, MD  dasatinib (SPRYCEL) 50 MG tablet Take 50 mg by mouth daily.  01/15/14  Yes [name], MD  diphenhydrAMINE (BENADRYL) 25 mg capsule Take 25-50 mg by mouth every 6 (six) hours as needed for itching (itching).    Yes Historical Provider, MD  hydrocortisone cream 1 % Apply 1 application topically daily as needed for itching (itching).    Yes Historical Provider, MD  HYDROmorphone (DILAUDID) 4 MG tablet Take 4 mg by mouth every 4 (four) hours as needed for severe pain (pain).   Yes Historical Provider, MD  insulin glargine (LANTUS) 100 UNIT/ML injection Inject  26 Units into the skin at bedtime.  08/09/14  Yes Modena Jansky, MD  losartan (COZAAR) 50 MG tablet Take 50 mg by mouth daily. 12/06/14  Yes Historical Provider, MD  metFORMIN (GLUCOPHAGE) 1000 MG tablet Take 1,000 mg by mouth 2 (two) times daily with a meal.   Yes Historical Provider, MD  pantoprazole (PROTONIX) 40 MG tablet Take 1 tablet (40 mg total) by mouth daily. 08/23/14  Yes [name], MD  sennosides-docusate sodium (SENOKOT-S) 8.6-50 MG tablet Take 2 tablets by mouth at bedtime.    Yes Historical Provider, MD  simvastatin (ZOCOR) 20 MG tablet Take 20 mg by mouth daily.    Yes Historical Provider, MD  vitamin B-12 (CYANOCOBALAMIN) 1000 MCG tablet Take 1,000 mcg by mouth daily.   Yes Historical Provider, MD  lactulose (CHRONULAC) 10 GM/15ML solution Take 10 g by mouth 2 (two) times daily as needed for moderate constipation.  06/21/14   Historical Provider, MD  promethazine (PHENERGAN) 25 MG tablet Take 25 mg by mouth every 6 (six) hours as needed for nausea or vomiting (nausea).    Historical Provider, MD    Allergies:   Allergies  Allergen  Reactions  . Ace Inhibitors Other (See Comments)    Unknown  . Ciprofloxacin Rash    Social History:  Ambulatory  Independently Lives at home  With family     reports that he quit smoking about 10 years ago. He has never used smokeless tobacco. He reports that he does not drink alcohol or use illicit drugs.    Family History: family history includes Cancer (age of onset: 21) in his mother; Heart attack in his father and mother; Stroke in his father.    Physical Exam: Patient Vitals for the past 24 hrs:  BP Temp Temp src Pulse Resp SpO2 Weight  12/26/14 2215 (!) 105/53 mmHg - - 106 13 96 % -  12/26/14 2201 - 99 F (37.2 C) Oral - - - -  12/26/14 2201 - - - 108 15 97 % -  12/26/14 2200 (!) 100/50 mmHg - - 109 (!) 6 97 % -  12/26/14 2158 (!) 100/49 mmHg - - 109 (!) 6 97 % -  12/26/14 2105 - - - 112 20 97 % -  12/26/14 2030 (!) 102/49  mmHg - - 115 17 96 % -  12/26/14 2015 (!) 100/52 mmHg - - 117 (!) 4 96 % -  12/26/14 2000 (!) 100/50 mmHg - - 118 11 95 % -  12/26/14 1945 (!) 102/52 mmHg - - 120 11 96 % -  12/26/14 1930 (!) 113/53 mmHg - - 120 19 94 % -  12/26/14 1915 (!) 119/50 mmHg - - (!) 121 (!) 8 94 % -  12/26/14 1900 124/59 mmHg - - 120 (!) 7 96 % -  12/26/14 1845 122/65 mmHg - - 119 15 99 % -  12/26/14 1830 130/62 mmHg - - 117 (!) 8 99 % -  12/26/14 1815 135/59 mmHg - - 118 11 98 % -  12/26/14 1725 122/58 mmHg 101.3 F (38.5 C) Oral (!) 128 20 94 % -  12/26/14 1637 137/58 mmHg (!) 104 F (40 C) Rectal (!) 134 20 (!) 88 % -  12/26/14 1636 - - - - - - 100.699 kg (222 lb)  12/26/14 1631 - - - - - 91 % -    1. General:  in No Acute distress 2. Psychological: somnolent and  Oriented 3. Head/ENT:   Moist   Mucous Membranes                          Head Non traumatic, neck supple                          Normal  Dentition 4. SKIN:   decreased Skin turgor,  Skin clean Dry and intact no rash 5. Heart: Regular rate and rhythm no Murmur, Rub or gallop 6. Lungs:  no wheezes occasional crackles   7. Abdomen: Soft, non-tender, Non distended 8. Lower extremities: no clubbing, cyanosis, or edema 9. Neurologically Grossly intact, moving all 4 extremities equally 10. MSK: Normal range of motion  body mass index is 28.49 kg/(m^2).   Labs on Admission:   Results for orders placed or performed during the hospital encounter of 12/26/14 (from the past 24 hour(s))  CBC WITH DIFFERENTIAL     Status: Abnormal   Collection Time: 12/26/14  5:08 PM  Result Value Ref Range   WBC 1.6 (L) 4.0 - 10.5 K/uL   RBC 3.73 (L) 4.22 - 5.81 MIL/uL   Hemoglobin 11.9 (L)  13.0 - 17.0 g/dL   HCT 36.0 (L) 39.0 - 52.0 %   MCV 96.5 78.0 - 100.0 fL   MCH 31.9 26.0 - 34.0 pg   MCHC 33.1 30.0 - 36.0 g/dL   RDW 17.7 (H) 11.5 - 15.5 %   Platelets 69 (L) 150 - 400 K/uL   Neutrophils Relative % 96 (H) 43 - 77 %   Neutro Abs 1.6 (L) 1.7 - 7.7 K/uL    Lymphocytes Relative 3 (L) 12 - 46 %   Lymphs Abs 0.0 (L) 0.7 - 4.0 K/uL   Monocytes Relative 1 (L) 3 - 12 %   Monocytes Absolute 0.0 (L) 0.1 - 1.0 K/uL   Eosinophils Relative 1 0 - 5 %   Eosinophils Absolute 0.0 0.0 - 0.7 K/uL   Basophils Relative 0 0 - 1 %   Basophils Absolute 0.0 0.0 - 0.1 K/uL  Comprehensive metabolic panel     Status: Abnormal   Collection Time: 12/26/14  5:08 PM  Result Value Ref Range   Sodium 141 135 - 145 mmol/L   Potassium 3.7 3.5 - 5.1 mmol/L   Chloride 109 96 - 112 mmol/L   CO2 24 19 - 32 mmol/L   Glucose, Bld 163 (H) 70 - 99 mg/dL   BUN 18 6 - 23 mg/dL   Creatinine, Ser 0.99 0.50 - 1.35 mg/dL   Calcium 9.3 8.4 - 10.5 mg/dL   Total Protein 6.4 6.0 - 8.3 g/dL   Albumin 3.3 (L) 3.5 - 5.2 g/dL   AST 43 (H) 0 - 37 U/L   ALT 26 0 - 53 U/L   Alkaline Phosphatase 182 (H) 39 - 117 U/L   Total Bilirubin 0.9 0.3 - 1.2 mg/dL   GFR calc non Af Amer 80 (L) >90 mL/min   GFR calc Af Amer >90 >90 mL/min   Anion gap 8 5 - 15  I-Stat CG4 Lactic Acid, ED     Status: None   Collection Time: 12/26/14  5:22 PM  Result Value Ref Range   Lactic Acid, Venous 1.79 0.5 - 2.0 mmol/L  I-Stat CG4 Lactic Acid, ED (not at [facility])     Status: None   Collection Time: 12/26/14  8:42 PM  Result Value Ref Range   Lactic Acid, Venous 0.94 0.5 - 2.0 mmol/L    UA no UTI  Lab Results  Component Value Date   HGBA1C 7.6* 08/07/2014    Estimated Creatinine Clearance: 86.7 mL/min (by C-G formula based on Cr of 0.99).  BNP (last 3 results) No results for input(s): PROBNP in the last 8760 hours.  Other results:  I have pearsonaly reviewed this: ECG REPORT  Rate:135  Rhythm: ST  ST&T Change: no sichemia   Filed Weights   12/26/14 1636  Weight: 100.699 kg (222 lb)     Cultures:    Component Value Date/Time   SDES URINE, CLEAN CATCH 08/14/2014 0736   SPECREQUEST Immunocompromised 08/14/2014 0736   CULT  08/14/2014 0736    Multiple bacterial morphotypes present, none  predominant. Suggest appropriate recollection if clinically indicated. Performed at [place] 08/15/2014 FINAL 08/14/2014 0736     Radiological Exams on Admission: Ct Abdomen Pelvis W Contrast  12/26/2014   CLINICAL DATA:  Fever, nausea and vomiting. History of metastatic pancreatic carcinoma.  EXAM: CT ABDOMEN AND PELVIS WITH CONTRAST  TECHNIQUE: Multidetector CT imaging of the abdomen and pelvis was performed using the standard protocol following bolus administration of intravenous contrast.  CONTRAST:  45mL OMNIPAQUE IOHEXOL 300 MG/ML SOLN, 133mL OMNIPAQUE IOHEXOL 300 MG/ML SOLN  COMPARISON:  CT of the chest, abdomen and pelvis on 12/14/2014  FINDINGS: Visualized lung bases showed consolidation in the posterior left lower lobe with some areas of small cavitation and associated small left pleural effusion. Findings are concerning for acute pneumonia and the only abnormality present at the lung bases previously was a trace amount of pleural fluid. Similar changes are also seen at the posterior right lung base but to a lesser degree with associated trace right pleural effusion. Bilateral airspace abnormalities in the lower lungs may suggest potential aspiration and correlation suggested clinically.  Multiple liver metastases are again noted in both lobes of the liver. Lesions are similar in size compared to the recent restaging CT. Metal stent in the common bile duct continues to show patency with air present in the biliary tree. No evidence of biliary obstruction.  Low-density mass in the body of the pancreas is similar and size in appearance with maximal diameter of approximately 3 cm. There is evidence of atrophy of the tail of the pancreas.  Compared to the prior study, there is some more nonspecific stranding in the retroperitoneum and also the mesentery without focal fluid collection or ascites. This is nonspecific but may relate to some relative third spacing of fluid. Bowel  shows no evidence of obstruction or perforation. Stable small right adrenal nodule. Stable degenerative disc disease at L3-4. No bony metastasis identified.  IMPRESSION: 1. New pulmonary consolidation in both posterior lower lobes, left greater than right with associated small pleural effusions. Findings are consistent with pneumonia and suggest potential aspiration. 2. Similar CT appearance of metastatic disease throughout the liver and similar sized of pancreatic carcinoma. 3. Increased prominence of stranding in the retroperitoneum and mesentery. This is nonspecific and is not associated with overt evidence of carcinomatosis or ascites. This may relate to some increased third spacing of fluid.   Electronically Signed   By: Aletta Edouard M.D.   On: 12/26/2014 21:26   Dg Chest Port 1 View  12/26/2014   CLINICAL DATA:  Fever. Currently on chemotherapy for pancreatic cancer. History of chronic myelocytic leukemia. Previous smoker.  EXAM: PORTABLE CHEST - 1 VIEW  COMPARISON:  08/13/2014  FINDINGS: Left central venous catheter is unchanged in position. Shallow inspiration. Mild increased heart size and pulmonary vascularity. No focal consolidation or edema. No blunting of costophrenic angles. No pneumothorax. Calcified aorta appear  IMPRESSION: Shallow inspiration. Mild cardiac enlargement and pulmonary vascular congestion. No edema or consolidation.   Electronically Signed   By: Lucienne Capers M.D.   On: 12/26/2014 18:17    Chart has been reviewed  Assessment/Plan  74 year old gentleman with history of pancreatic cancer metastasis to the liver currently undergoing chemotherapy here with sepsis likely secondary to bilateral pneumonia   Present on Admission:  . Via Sepsis with hypotension - fluid resuscitation, aggressive IV antibiotics, blood cultures pending. Lactic acid within normal limits reassuring. Admit to step down. [facility] M consult has been notified will follow through [place] . CKD stage 3 due to  type 2 diabetes mellitus - currently stable  . Diabetes mellitus with neuropathy - decrease Lantus) sliding scale  . Hypertension - hold all blood pressure medications given hypotension  . Leukopenia due to antineoplastic chemotherapy - patient received Neupogen today we'll need to discuss with oncology in the morning  . Malignant neoplasm of head of pancreas - oncology consult in a.m.  Marland Kitchen Thrombocytopenia due to drugs -  continue to monitor no Lovenox   . HCAP (healthcare-associated pneumonia) - broad-spectrum antibiotics sputum culture   Prophylaxis: SCD , Protonix  CODE STATUS:  FULL CODE   Other plan as per orders.  I have spent a total of 75 min on this admission extra time was taken to discuss case with [facility] M as well as discuss care with family extensively  [place] 12/26/2014, 10:40 PM  [facility]  Pager [phone]   after 2 AM please page floor coverage PA If 7AM-7PM, please contact the day team taking care of the patient  [email or web address]  Password [email or web address]

## 2014-12-26 NOTE — ED Notes (Signed)
MD at bedside. 

## 2014-12-26 NOTE — ED Notes (Addendum)
Per EMS, Pt, from home, c/o fever and n/v starting this afternoon.  Pt had chemo infusion [facility] x 2 days ago and Neulasta injection this morning at [facility].  Hx of pancreatic CA.  4 mg Zofran and 336mL NS given en route.  Pt took 1500mg  acetaminophen around 1500.

## 2014-12-26 NOTE — Telephone Encounter (Signed)
Remo Lipps called stating that she tried to get pt out of bed to come in for his appointments and she could not get him out of bed so they called 911.  He is headed to the [facility] ED.  Onc tx schedule to cancel appts for today.

## 2014-12-26 NOTE — ED Notes (Signed)
Bed: TW44 Expected date:  Expected time:  Means of arrival:  Comments: Ems- cancer pt

## 2014-12-27 LAB — COMPREHENSIVE METABOLIC PANEL
ALK PHOS: 140 U/L — AB (ref 39–117)
ALT: 21 U/L (ref 0–53)
ALT: 19 U/L (ref 0–53)
ANION GAP: 5 (ref 5–15)
ANION GAP: 7 (ref 5–15)
AST: 32 U/L (ref 0–37)
AST: 27 U/L (ref 0–37)
Albumin: 2.7 g/dL — ABNORMAL LOW (ref 3.5–5.2)
Albumin: 2.7 g/dL — ABNORMAL LOW (ref 3.5–5.2)
Alkaline Phosphatase: 131 U/L — ABNORMAL HIGH (ref 39–117)
BUN: 16 mg/dL (ref 6–23)
BUN: 15 mg/dL (ref 6–23)
CO2: 22 mmol/L (ref 19–32)
CO2: 24 mmol/L (ref 19–32)
Calcium: 8.3 mg/dL — ABNORMAL LOW (ref 8.4–10.5)
Calcium: 8.2 mg/dL — ABNORMAL LOW (ref 8.4–10.5)
Chloride: 110 mmol/L (ref 96–112)
Chloride: 112 mmol/L (ref 96–112)
Creatinine, Ser: 0.95 mg/dL (ref 0.50–1.35)
Creatinine, Ser: 0.97 mg/dL (ref 0.50–1.35)
GFR calc non Af Amer: 82 mL/min — ABNORMAL LOW (ref >90)
GFR, EST NON AFRICAN AMERICAN: 81 mL/min — AB (ref >90)
Glucose, Bld: 175 mg/dL — ABNORMAL HIGH (ref 70–99)
Glucose, Bld: 136 mg/dL — ABNORMAL HIGH (ref 70–99)
POTASSIUM: 3.8 mmol/L (ref 3.5–5.1)
POTASSIUM: 4 mmol/L (ref 3.5–5.1)
SODIUM: 141 mmol/L (ref 135–145)
Sodium: 139 mmol/L (ref 135–145)
TOTAL PROTEIN: 5.4 g/dL — AB (ref 6.0–8.3)
TOTAL PROTEIN: 5.4 g/dL — AB (ref 6.0–8.3)
Total Bilirubin: 1.2 mg/dL (ref 0.3–1.2)
Total Bilirubin: 1.2 mg/dL (ref 0.3–1.2)

## 2014-12-27 LAB — PHOSPHORUS: Phosphorus: 2.4 mg/dL (ref 2.3–4.6)

## 2014-12-27 LAB — CBC WITH DIFFERENTIAL/PLATELET
BASOS ABS: 0 10*3/uL (ref 0.0–0.1)
Basophils Relative: 0 % (ref 0–1)
Eosinophils Absolute: 0 10*3/uL (ref 0.0–0.7)
Eosinophils Relative: 0 % (ref 0–5)
HCT: 29.1 % — ABNORMAL LOW (ref 39.0–52.0)
Hemoglobin: 9.5 g/dL — ABNORMAL LOW (ref 13.0–17.0)
LYMPHS ABS: 0.2 10*3/uL — AB (ref 0.7–4.0)
Lymphocytes Relative: 4 % — ABNORMAL LOW (ref 12–46)
MCH: 32.1 pg (ref 26.0–34.0)
MCHC: 32.6 g/dL (ref 30.0–36.0)
MCV: 98.3 fL (ref 78.0–100.0)
MONO ABS: 0 10*3/uL — AB (ref 0.1–1.0)
MONOS PCT: 1 % — AB (ref 3–12)
NEUTROS PCT: 95 % — AB (ref 43–77)
Neutro Abs: 4.2 10*3/uL (ref 1.7–7.7)
Platelets: 73 10*3/uL — ABNORMAL LOW (ref 150–400)
RBC: 2.96 MIL/uL — ABNORMAL LOW (ref 4.22–5.81)
RDW: 18.1 % — AB (ref 11.5–15.5)
WBC: 4.4 10*3/uL (ref 4.0–10.5)

## 2014-12-27 LAB — CBC
HCT: 27.7 % — ABNORMAL LOW (ref 39.0–52.0)
Hemoglobin: 9.1 g/dL — ABNORMAL LOW (ref 13.0–17.0)
MCH: 32.4 pg (ref 26.0–34.0)
MCHC: 32.9 g/dL (ref 30.0–36.0)
MCV: 98.6 fL (ref 78.0–100.0)
PLATELETS: 68 10*3/uL — AB (ref 150–400)
RBC: 2.81 MIL/uL — ABNORMAL LOW (ref 4.22–5.81)
RDW: 18.2 % — AB (ref 11.5–15.5)
WBC: 4.3 10*3/uL (ref 4.0–10.5)

## 2014-12-27 LAB — GLUCOSE, CAPILLARY
GLUCOSE-CAPILLARY: 155 mg/dL — AB (ref 70–99)
GLUCOSE-CAPILLARY: 137 mg/dL — AB (ref 70–99)
GLUCOSE-CAPILLARY: 143 mg/dL — AB (ref 70–99)
Glucose-Capillary: 164 mg/dL — ABNORMAL HIGH (ref 70–99)
Glucose-Capillary: 142 mg/dL — ABNORMAL HIGH (ref 70–99)
Glucose-Capillary: 115 mg/dL — ABNORMAL HIGH (ref 70–99)

## 2014-12-27 LAB — PROTIME-INR
INR: 1.12 (ref 0.00–1.49)
PROTHROMBIN TIME: 14.5 s (ref 11.6–15.2)

## 2014-12-27 LAB — PROCALCITONIN: Procalcitonin: 22.09 ng/mL

## 2014-12-27 LAB — LACTIC ACID, PLASMA
LACTIC ACID, VENOUS: 1 mmol/L (ref 0.5–2.0)
LACTIC ACID, VENOUS: 1.6 mmol/L (ref 0.5–2.0)

## 2014-12-27 LAB — TSH: TSH: 1.189 u[IU]/mL (ref 0.350–4.500)

## 2014-12-27 LAB — HEMOGLOBIN A1C
Hgb A1c MFr Bld: 6.8 % — ABNORMAL HIGH (ref 4.8–5.6)
MEAN PLASMA GLUCOSE: 148 mg/dL

## 2014-12-27 LAB — APTT: aPTT: 30 seconds (ref 24–37)

## 2014-12-27 LAB — MAGNESIUM: Magnesium: 1.1 mg/dL — ABNORMAL LOW (ref 1.5–2.5)

## 2014-12-27 LAB — MRSA PCR SCREENING: MRSA by PCR: NEGATIVE

## 2014-12-27 LAB — STREP PNEUMONIAE URINARY ANTIGEN: Strep Pneumo Urinary Antigen: NEGATIVE

## 2014-12-27 MED ORDER — ONDANSETRON HCL 4 MG/2ML IJ SOLN: 4.0000 mg | Freq: Four times a day (QID) | INTRAMUSCULAR | Status: DC | PRN

## 2014-12-27 MED ORDER — ACETAMINOPHEN 325 MG PO TABS
650.0000 mg | ORAL_TABLET | Freq: Four times a day (QID) | ORAL | Status: DC | PRN
  Administered 2014-12-27 – 2014-12-28 (×2): 650 mg via ORAL
  Filled 2014-12-27 (×2): qty 2

## 2014-12-27 MED ORDER — ACETAMINOPHEN 650 MG RE SUPP: 650.0000 mg | Freq: Four times a day (QID) | RECTAL | Status: DC | PRN

## 2014-12-27 MED ORDER — PANTOPRAZOLE SODIUM 40 MG PO TBEC
40.0000 mg | DELAYED_RELEASE_TABLET | Freq: Every day | ORAL | Status: DC
  Administered 2014-12-27 – 2014-12-29 (×3): 40 mg via ORAL
  Filled 2014-12-27 (×4): qty 1

## 2014-12-27 MED ORDER — SODIUM CHLORIDE 0.9 % IV SOLN
INTRAVENOUS | Status: AC
  Administered 2014-12-27: 01:00:00 via INTRAVENOUS

## 2014-12-27 MED ORDER — INSULIN GLARGINE 100 UNIT/ML ~~LOC~~ SOLN
10.0000 [IU] | Freq: Every day | SUBCUTANEOUS | Status: DC
  Administered 2014-12-27 – 2014-12-28 (×3): 10 [IU] via SUBCUTANEOUS
  Filled 2014-12-27 (×4): qty 0.1

## 2014-12-27 MED ORDER — SODIUM CHLORIDE 0.9 % IJ SOLN
3.0000 mL | Freq: Two times a day (BID) | INTRAMUSCULAR | Status: DC
  Administered 2014-12-27: 3 mL via INTRAVENOUS

## 2014-12-27 MED ORDER — ONDANSETRON HCL 4 MG PO TABS: 4.0000 mg | ORAL_TABLET | Freq: Four times a day (QID) | ORAL | Status: DC | PRN

## 2014-12-27 MED ORDER — INSULIN ASPART 100 UNIT/ML ~~LOC~~ SOLN
0.0000 [IU] | SUBCUTANEOUS | Status: DC
  Administered 2014-12-27 – 2014-12-29 (×6): 1 [IU] via SUBCUTANEOUS

## 2014-12-27 NOTE — Progress Notes (Signed)
INITIAL NUTRITION ASSESSMENT  DOCUMENTATION CODES Per approved criteria  -Not Applicable   INTERVENTION: - Continue to encourage adequate PO intake - Diet advancement per MD  NUTRITION DIAGNOSIS: Inadequate oral intake related to pancreatic cancer as evidenced by previous wt loss- improved, but should be monitored  Goal: Pt to meet >/= 90% of their estimated nutrition needs   Monitor:  Weight trend, po intake, labs, diet advancement  Reason for Assessment: Malnutrition Screening Tool  74 y.o. male  Admitting Dx: <principal problem not specified>  ASSESSMENT: Patient with hx of Pancreatic cancer sp recent Chemo and was given Nuelasta today. Today he developed shaking chills and generalized weakness. He was brought to [facility] ER. Found to be febrile up to 104. AN count 1.6  Spoke with pt and family.  Pt's weight has been stable for ~2 months. His weight had previously dropped 44 lbs due to cancer treatment. His weight was as low as 213 lbs. His po intake has been good lately and he has managed to gain some weight back.  Pt hungry and asking for diet. Diet upgraded to full liquids. No nausea today. No signs of fat or muscle wasting. Pt denied the need for nutritional supplements at this time. Will continue to monitor PO intake.   Labs Reviewed  Height: Ht Readings from Last 1 Encounters:  12/27/14 6\' 2"  (1.88 m)    Weight: Wt Readings from Last 1 Encounters:  12/27/14 227 lb 11.8 oz (103.3 kg)    Ideal Body Weight: 82.2 kg  % Ideal Body Weight: 126%  Wt Readings from Last 10 Encounters:  12/27/14 227 lb 11.8 oz (103.3 kg)  12/17/14 222 lb 3.2 oz (100.789 kg)  11/19/14 222 lb (100.699 kg)  11/05/14 230 lb (104.327 kg)  10/19/14 224 lb 3.2 oz (101.696 kg)  09/11/14 221 lb 3.2 oz (100.336 kg)  09/03/14 214 lb (97.07 kg)  08/31/14 214 lb 4.8 oz (97.206 kg)  08/13/14 223 lb 15.8 oz (101.6 kg)  08/13/14 223 lb 3.2 oz (101.243 kg)    BMI:  Body mass index is 29.23  kg/(m^2).  Estimated Nutritional Needs: Kcal: 2200-2400 Protein: 150-160 g Fluid: 2.2-2.4 L/day  Skin: intact  Diet Order: Diet full liquid  EDUCATION NEEDS: -Education needs addressed   Intake/Output Summary (Last 24 hours) at 12/27/14 1154 Last data filed at 12/27/14 0537  Gross per 24 hour  Intake 2330.83 ml  Output      0 ml  Net 2330.83 ml    Last BM: prior to admission   Labs:   Recent Labs Lab 12/24/14 1237 12/26/14 1708 12/27/14 0041  NA 142 141 139  K 3.8 3.7 4.0  CL  --  109 110  CO2 19* 24 22  BUN 10.0 18 16  CREATININE 1.1 0.99 0.95  CALCIUM 9.2 9.3 8.3*  GLUCOSE 224* 163* 175*    CBG (last 3)   Recent Labs  12/27/14 0119 12/27/14 0422 12/27/14 0834  GLUCAP 164* 142* 155*    Scheduled Meds: . insulin aspart  0-9 Units Subcutaneous 6 times per day  . insulin glargine  10 Units Subcutaneous QHS  . pantoprazole  40 mg Oral Daily  . piperacillin-tazobactam (ZOSYN)  IV  3.375 g Intravenous Q8H  . sodium chloride  3 mL Intravenous Q12H  . vancomycin  1,000 mg Intravenous Q8H    Continuous Infusions:   Past Medical History  Diagnosis Date  . Chronic back pain greater than 3 months duration   . Bulging discs   .  Diabetes mellitus     type II; neuropathy;   . Hypertension   . High cholesterol   . Leukocytosis   . GERD (gastroesophageal reflux disease)   . Pancreatitis 2004  . Anxiety   . DDD (degenerative disc disease) 2005    lumbar spine  . History of smoking 08/01/2012  . Weight loss 08/01/2012  . Cough 08/01/2012  . Left leg pain 08/05/2012  . Chronic pancreatitis   . OSA (obstructive sleep apnea) 2010    does not use CPAP  . Nausea alone 06/13/2014  . Allergy   . Mucositis (ulcerative) due to antineoplastic therapy 07/02/2014  . Constipated 07/16/2014  . Gastric ulcer 08/23/2014  . Hx of radiation therapy 06/25/14-08/06/14    pancreas head, 50.4Gy  . 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  Pancreatic cancer 06/08/2014  . Chest pain 11/05/2014    Past Surgical History  Procedure Laterality Date  . Cholecystectomy      laparoscopic  . Hernia repair      umbilical  . Skin cancer removed  2012    right ear, basal cell carcinoma.   Marland Kitchen Spinal stretching    . Colonoscopy  2012    [facility]  . Eus Left 06/07/2014    Procedure: UPPER ENDOSCOPIC ULTRASOUND (EUS) LINEAR;  Surgeon: Arta Silence, MD;  Location: [facility];  Service: Endoscopy;  Laterality: Left;  . Ercp N/A 06/07/2014    Procedure: ENDOSCOPIC RETROGRADE CHOLANGIOPANCREATOGRAPHY (ERCP);  Surgeon: Arta Silence, MD;  Location: [facility];  Service: Endoscopy;  Laterality: N/A;  . Eye surgery      bilateral cataracts with lens implant  . Cataract extraction Bilateral   . Port acath placement Left 06/14/14  . Portacath placement Left 06/14/2014    Procedure: INSERTION PORT-A-CATH;  Surgeon: Stark Klein, MD;  Location: [facility];  Service: General;  Laterality: Left;  . Esophagogastroduodenoscopy N/A 08/15/2014    Procedure: ESOPHAGOGASTRODUODENOSCOPY (EGD);  Surgeon: Lear Ng, MD;  Location: [facility];  Service: Endoscopy;  Laterality: N/A;    Laurette Schimke MS, RD, LDN

## 2014-12-27 NOTE — Progress Notes (Signed)
CARE MANAGEMENT NOTE 12/27/2014  Patient:  Evan [name], Evan [name]   Account Number:  [id number]  Date Initiated:  12/27/2014  Documentation initiated by:  DAVIS,RHONDA  Subjective/Objective Assessment:   hx of Pancreatic cancer sp recent Chemo and was given Nuelasta today. Today he developed shaking chills and generalized weakness. He was brought [facility]. Found to be febrile up to 104. AN count 1.6  In ER showing evidence of sepsis with ta     Action/Plan:   home when stable   Anticipated DC Date:  12/30/2014   Anticipated DC Plan:  HOME/SELF CARE  In-house referral  NA      DC Planning Services  CM consult      PAC Choice  NA   Choice offered to / List presented to:             Status of service:  In process, will continue to follow Medicare Important Message given?   (If response is "NO", the following Medicare IM given date fields will be blank) Date Medicare IM given:   Medicare IM given by:   Date Additional Medicare IM given:   Additional Medicare IM given by:    Discharge Disposition:    Per UR Regulation:  Reviewed for med. necessity/level of care/duration of stay  If discussed at [place] of Stay Meetings, dates discussed:    Comments:  12/27/2014/Rhonda L. Rosana Hoes, RN, BSN, CCM: Chart review for medical necessity and patient discharge needs. Case Manager will follow for patient condition changes. [id number]:

## 2014-12-27 NOTE — Progress Notes (Signed)
PROGRESS NOTE  Evan [name]:[id number] DOB: [birth year] DOA: 12/26/2014 PCP: [name], MD  HPI: hx of Pancreatic cancer sp recent Chemo and was given Nuelasta today. Today he developed shaking chills and generalized weakness. He was brought to [facility] ER. Found to be febrile up to 104. AN count 1.6 In ER showing evidence of sepsis with tachycardia and fever up to 104 with increased somnolence. His BP was noted down to 93/56. He has been complaining of some cough and had some nausea. CT of abdomen showed bilateral PNA  Subjective / 24 H Interval events Feels much improved today  Assessment/Plan: Active Problems:   Hypertension   Diabetes mellitus with neuropathy   CKD stage 3 due to type 2 diabetes mellitus   Malignant neoplasm of head of pancreas   Leukopenia due to antineoplastic chemotherapy   Thrombocytopenia due to drugs   Neutropenic fever   Sepsis   HCAP (healthcare-associated pneumonia)   Sepsis due to HCAP - continue Vancomycin and Zosyn - cultures pending, negative to date - blood pressure improved with fluids and antibiotics, afebrile this morning  CKD stage 3 due to type 2 diabetes mellitus - currently stable   Diabetes mellitus with neuropathy - Lantus, sliding scale   Hypertension - hold all blood pressure medications given hypotension   Leukopenia due to antineoplastic chemotherapy - patient received Neupogen 2/10  Malignant neoplasm of head of pancreas - followed by Dr. Alvy Bimler as an outpatient  Thrombocytopenia due to drugs - continue to monitor no Lovenox   HCAP (healthcare-associated pneumonia) - broad-spectrum antibiotics sputum culture   Diet: Diet NPO time specified Except for: Ice Chips, Sips with Meds Fluids: NS DVT Prophylaxis: SCD  Code Status: Full Code Family Communication: d/w patient  Disposition Plan: remain inpatient  Consultants:  None   Procedures:  None    Antibiotics Vancomycin 2/10 >> Zosyn 2/10  >>   Studies  Ct Abdomen Pelvis W Contrast  12/26/2014   CLINICAL DATA:  Fever, nausea and vomiting. History of metastatic pancreatic carcinoma.  EXAM: CT ABDOMEN AND PELVIS WITH CONTRAST  TECHNIQUE: Multidetector CT imaging of the abdomen and pelvis was performed using the standard protocol following bolus administration of intravenous contrast.  CONTRAST:  86mL OMNIPAQUE IOHEXOL 300 MG/ML SOLN, 144mL OMNIPAQUE IOHEXOL 300 MG/ML SOLN  COMPARISON:  CT of the chest, abdomen and pelvis on 12/14/2014  FINDINGS: Visualized lung bases showed consolidation in the posterior left lower lobe with some areas of small cavitation and associated small left pleural effusion. Findings are concerning for acute pneumonia and the only abnormality present at the lung bases previously was a trace amount of pleural fluid. Similar changes are also seen at the posterior right lung base but to a lesser degree with associated trace right pleural effusion. Bilateral airspace abnormalities in the lower lungs may suggest potential aspiration and correlation suggested clinically.  Multiple liver metastases are again noted in both lobes of the liver. Lesions are similar in size compared to the recent restaging CT. Metal stent in the common bile duct continues to show patency with air present in the biliary tree. No evidence of biliary obstruction.  Low-density mass in the body of the pancreas is similar and size in appearance with maximal diameter of approximately 3 cm. There is evidence of atrophy of the tail of the pancreas.  Compared to the prior study, there is some more nonspecific stranding in the retroperitoneum and also the mesentery without focal fluid collection or ascites. This is nonspecific  but may relate to some relative third spacing of fluid. Bowel shows no evidence of obstruction or perforation. Stable small right adrenal nodule. Stable degenerative disc disease at L3-4. No bony metastasis identified.  IMPRESSION: 1. New  pulmonary consolidation in both posterior lower lobes, left greater than right with associated small pleural effusions. Findings are consistent with pneumonia and suggest potential aspiration. 2. Similar CT appearance of metastatic disease throughout the liver and similar sized of pancreatic carcinoma. 3. Increased prominence of stranding in the retroperitoneum and mesentery. This is nonspecific and is not associated with overt evidence of carcinomatosis or ascites. This may relate to some increased third spacing of fluid.   Electronically Signed   By: Aletta Edouard M.D.   On: 12/26/2014 21:26   Dg Chest Port 1 View  12/26/2014   CLINICAL DATA:  Fever. Currently on chemotherapy for pancreatic cancer. History of chronic myelocytic leukemia. Previous smoker.  EXAM: PORTABLE CHEST - 1 VIEW  COMPARISON:  08/13/2014  FINDINGS: Left central venous catheter is unchanged in position. Shallow inspiration. Mild increased heart size and pulmonary vascularity. No focal consolidation or edema. No blunting of costophrenic angles. No pneumothorax. Calcified aorta appear  IMPRESSION: Shallow inspiration. Mild cardiac enlargement and pulmonary vascular congestion. No edema or consolidation.   Electronically Signed   By: Lucienne Capers M.D.   On: 12/26/2014 18:17   Objective  Filed Vitals:   12/26/14 2315 12/26/14 2358 12/27/14 0100 12/27/14 0424  BP: 110/59 114/72    Pulse: 97 108    Temp:  99.3 F (37.4 C)  99.3 F (37.4 C)  TempSrc:  Oral  Oral  Resp: 15 24    Height:   6\' 2"  (1.88 m)   Weight:   103.3 kg (227 lb 11.8 oz)   SpO2: 99% 99%      Intake/Output Summary (Last 24 hours) at 12/27/14 0717 Last data filed at 12/27/14 0537  Gross per 24 hour  Intake 2330.83 ml  Output      0 ml  Net 2330.83 ml   Filed Weights   12/26/14 1636 12/27/14 0100  Weight: 100.699 kg (222 lb) 103.3 kg (227 lb 11.8 oz)   Exam:  General:  NAD  HEENT: no scleral icterus, PERRL  Cardiovascular: RRR without  murmurs  Respiratory: CTA biL, no wheezing, no crackles  Abdomen: soft, non tender, BS +  MSK/Extremities: no clubbing/cyanosis  Skin: no rashes,   Neuro: non focal   Data Reviewed: Basic Metabolic Panel:  Recent Labs Lab 12/24/14 1237 12/26/14 1708 12/27/14 0041  NA 142 141 139  K 3.8 3.7 4.0  CL  --  109 110  CO2 19* 24 22  GLUCOSE 224* 163* 175*  BUN 10.0 18 16  CREATININE 1.1 0.99 0.95  CALCIUM 9.2 9.3 8.3*   Liver Function Tests:  Recent Labs Lab 12/24/14 1237 12/26/14 1708 12/27/14 0041  AST 34 43* 32  ALT 22 26 21   ALKPHOS 228* 182* 140*  BILITOT 0.47 0.9 1.2  PROT 5.9* 6.4 5.4*  ALBUMIN 3.2* 3.3* 2.7*   CBC:  Recent Labs Lab 12/24/14 1236 12/26/14 1708 12/27/14 0041  WBC 2.8* 1.6* 4.4  NEUTROABS 1.6 1.6* 4.2  HGB 10.9* 11.9* 9.5*  HCT 33.9* 36.0* 29.1*  MCV 97.3 96.5 98.3  PLT 114* 69* 73*   CBG:  Recent Labs Lab 12/27/14 0119 12/27/14 0422  GLUCAP 164* 142*    Recent Results (from the past 240 hour(s))  MRSA PCR Screening     Status:  None   Collection Time: 12/27/14 12:01 AM  Result Value Ref Range Status   MRSA by PCR NEGATIVE NEGATIVE Final    Comment:        The GeneXpert MRSA Assay (FDA approved for NASAL specimens only), is one component of a comprehensive MRSA colonization surveillance program. It is not intended to diagnose MRSA infection nor to guide or monitor treatment for MRSA infections.      Scheduled Meds: . insulin aspart  0-9 Units Subcutaneous 6 times per day  . insulin glargine  10 Units Subcutaneous QHS  . pantoprazole  40 mg Oral Daily  . piperacillin-tazobactam (ZOSYN)  IV  3.375 g Intravenous Q8H  . sodium chloride  3 mL Intravenous Q12H  . vancomycin  1,000 mg Intravenous Q8H   Continuous Infusions: . sodium chloride 125 mL/hr at 12/27/14 0127    Marzetta Board, MD [facility] Pager [phone]. If 7 PM - 7 AM, please contact night-coverage at [email or web address], password [facility] 12/27/2014,  7:17 AM  LOS: 1 day

## 2014-12-28 LAB — GLUCOSE, CAPILLARY
GLUCOSE-CAPILLARY: 98 mg/dL (ref 70–99)
GLUCOSE-CAPILLARY: 131 mg/dL — AB (ref 70–99)
Glucose-Capillary: 101 mg/dL — ABNORMAL HIGH (ref 70–99)
Glucose-Capillary: 104 mg/dL — ABNORMAL HIGH (ref 70–99)
Glucose-Capillary: 109 mg/dL — ABNORMAL HIGH (ref 70–99)
Glucose-Capillary: 126 mg/dL — ABNORMAL HIGH (ref 70–99)
Glucose-Capillary: 142 mg/dL — ABNORMAL HIGH (ref 70–99)

## 2014-12-28 LAB — BASIC METABOLIC PANEL
Anion gap: 8 (ref 5–15)
BUN: 11 mg/dL (ref 6–23)
CHLORIDE: 109 mmol/L (ref 96–112)
CO2: 25 mmol/L (ref 19–32)
CREATININE: 0.97 mg/dL (ref 0.50–1.35)
Calcium: 8.7 mg/dL (ref 8.4–10.5)
GFR calc Af Amer: >90 mL/min (ref >90)
GFR calc non Af Amer: 81 mL/min — ABNORMAL LOW (ref >90)
GLUCOSE: 107 mg/dL — AB (ref 70–99)
Potassium: 3.6 mmol/L (ref 3.5–5.1)
Sodium: 142 mmol/L (ref 135–145)

## 2014-12-28 LAB — CBC
HEMATOCRIT: 28.2 % — AB (ref 39.0–52.0)
HEMOGLOBIN: 9.2 g/dL — AB (ref 13.0–17.0)
MCH: 32.1 pg (ref 26.0–34.0)
MCHC: 32.6 g/dL (ref 30.0–36.0)
MCV: 98.3 fL (ref 78.0–100.0)
Platelets: 73 10*3/uL — ABNORMAL LOW (ref 150–400)
RBC: 2.87 MIL/uL — ABNORMAL LOW (ref 4.22–5.81)
RDW: 18.1 % — AB (ref 11.5–15.5)
WBC: 4.3 10*3/uL (ref 4.0–10.5)

## 2014-12-28 LAB — LEGIONELLA ANTIGEN, URINE

## 2014-12-28 LAB — VANCOMYCIN, TROUGH: Vancomycin Tr: 24.8 ug/mL — ABNORMAL HIGH (ref 10.0–20.0)

## 2014-12-28 MED ORDER — SODIUM CHLORIDE 0.9 % IJ SOLN
10.0000 mL | INTRAMUSCULAR | Status: DC | PRN
  Administered 2014-12-28 – 2014-12-29 (×4): 10 mL
  Filled 2014-12-28 (×3): qty 40

## 2014-12-28 MED ORDER — SODIUM CHLORIDE 0.9 % IJ SOLN: 10.0000 mL | Freq: Two times a day (BID) | INTRAMUSCULAR | Status: DC

## 2014-12-28 MED ORDER — VANCOMYCIN HCL IN DEXTROSE 1-5 GM/200ML-% IV SOLN
1000.0000 mg | Freq: Two times a day (BID) | INTRAVENOUS | Status: DC
  Administered 2014-12-29: 1000 mg via INTRAVENOUS
  Filled 2014-12-28 (×2): qty 200

## 2014-12-28 NOTE — Progress Notes (Signed)
PROGRESS NOTE  Evan [name] XKG:[id number] DOB: February 25, 1941 DOA: 12/26/2014 PCP: [name], MD  HPI: hx of Pancreatic cancer sp recent Chemo and was given Nuelasta today. Today he developed shaking chills and generalized weakness. He was brought to [facility] ER. Found to be febrile up to 104. AN count 1.6 In ER showing evidence of sepsis with tachycardia and fever up to 104 with increased somnolence. His BP was noted down to 93/56. He has been complaining of some cough and had some nausea. CT of abdomen showed bilateral PNA  Subjective / 24 H Interval events Feels better, close to normal  Assessment/Plan: Active Problems:   Hypertension   Diabetes mellitus with neuropathy   CKD stage 3 due to type 2 diabetes mellitus   Malignant neoplasm of head of pancreas   Leukopenia due to antineoplastic chemotherapy   Thrombocytopenia due to drugs   Neutropenic fever   Sepsis   HCAP (healthcare-associated pneumonia)   Sepsis due to HCAP - continue Vancomycin and Zosyn - cultures pending, negative to date - will continue IV antibiotics for today, if continues to be afebrile and clinically improved, transition to po in am and consider d/c home   CKD stage 3 due to type 2 diabetes mellitus - currently stable   Diabetes mellitus with neuropathy - Lantus, sliding scale   Hypertension - hold all blood pressure medications given hypotension   Leukopenia due to antineoplastic chemotherapy - patient received Neupogen 2/10  Malignant neoplasm of head of pancreas - followed by Dr. Alvy Bimler as an outpatient  Thrombocytopenia due to drugs - continue to monitor no Lovenox, stable  HCAP (healthcare-associated pneumonia) - broad-spectrum antibiotics sputum culture   Diet: Diet Carb Modified Fluids: NS DVT Prophylaxis: SCD  Code Status: Full Code Family Communication: d/w patient  Disposition Plan: remain inpatient   Consultants:  None   Procedures:  None     Antibiotics Vancomycin 2/10 >> Zosyn 2/10 >>   Studies  Ct Abdomen Pelvis W Contrast  12/26/2014   CLINICAL DATA:  Fever, nausea and vomiting. History of metastatic pancreatic carcinoma.  EXAM: CT ABDOMEN AND PELVIS WITH CONTRAST  TECHNIQUE: Multidetector CT imaging of the abdomen and pelvis was performed using the standard protocol following bolus administration of intravenous contrast.  CONTRAST:  70mL OMNIPAQUE IOHEXOL 300 MG/ML SOLN, 11mL OMNIPAQUE IOHEXOL 300 MG/ML SOLN  COMPARISON:  CT of the chest, abdomen and pelvis on 12/14/2014  FINDINGS: Visualized lung bases showed consolidation in the posterior left lower lobe with some areas of small cavitation and associated small left pleural effusion. Findings are concerning for acute pneumonia and the only abnormality present at the lung bases previously was a trace amount of pleural fluid. Similar changes are also seen at the posterior right lung base but to a lesser degree with associated trace right pleural effusion. Bilateral airspace abnormalities in the lower lungs may suggest potential aspiration and correlation suggested clinically.  Multiple liver metastases are again noted in both lobes of the liver. Lesions are similar in size compared to the recent restaging CT. Metal stent in the common bile duct continues to show patency with air present in the biliary tree. No evidence of biliary obstruction.  Low-density mass in the body of the pancreas is similar and size in appearance with maximal diameter of approximately 3 cm. There is evidence of atrophy of the tail of the pancreas.  Compared to the prior study, there is some more nonspecific stranding in the retroperitoneum and also the mesentery without  focal fluid collection or ascites. This is nonspecific but may relate to some relative third spacing of fluid. Bowel shows no evidence of obstruction or perforation. Stable small right adrenal nodule. Stable degenerative disc disease at L3-4. No  bony metastasis identified.  IMPRESSION: 1. New pulmonary consolidation in both posterior lower lobes, left greater than right with associated small pleural effusions. Findings are consistent with pneumonia and suggest potential aspiration. 2. Similar CT appearance of metastatic disease throughout the liver and similar sized of pancreatic carcinoma. 3. Increased prominence of stranding in the retroperitoneum and mesentery. This is nonspecific and is not associated with overt evidence of carcinomatosis or ascites. This may relate to some increased third spacing of fluid.   Electronically Signed   By: Aletta Edouard M.D.   On: 12/26/2014 21:26   Dg Chest Port 1 View  12/26/2014   CLINICAL DATA:  Fever. Currently on chemotherapy for pancreatic cancer. History of chronic myelocytic leukemia. Previous smoker.  EXAM: PORTABLE CHEST - 1 VIEW  COMPARISON:  08/13/2014  FINDINGS: Left central venous catheter is unchanged in position. Shallow inspiration. Mild increased heart size and pulmonary vascularity. No focal consolidation or edema. No blunting of costophrenic angles. No pneumothorax. Calcified aorta appear  IMPRESSION: Shallow inspiration. Mild cardiac enlargement and pulmonary vascular congestion. No edema or consolidation.   Electronically Signed   By: Lucienne Capers M.D.   On: 12/26/2014 18:17   Objective  Filed Vitals:   12/27/14 1853 12/27/14 2006 12/28/14 0411 12/28/14 1300  BP: 147/64 141/61 142/66 134/62  Pulse: 93 76 82 83  Temp: 98 F (36.7 C) 98.5 F (36.9 C) 98.7 F (37.1 C) 97.3 F (36.3 C)  TempSrc: Oral Oral Oral Oral  Resp: 20 20 18 18   Height:      Weight:      SpO2: 98% 96% 98% 99%    Intake/Output Summary (Last 24 hours) at 12/28/14 1413 Last data filed at 12/28/14 1130  Gross per 24 hour  Intake   1310 ml  Output   1125 ml  Net    185 ml   Filed Weights   12/26/14 1636 12/27/14 0100  Weight: 100.699 kg (222 lb) 103.3 kg (227 lb 11.8 oz)    Exam:  General:  NAD    HEENT: no scleral icterus, PERRL   Cardiovascular: RRR without murmurs   Respiratory: CTA biL, no wheezing, no crackles   Abdomen: soft, non tender, BS +   MSK/Extremities: no clubbing/cyanosis   Skin: no rashes,    Neuro: non focal     Data Reviewed: Basic Metabolic Panel:  Recent Labs Lab 12/24/14 1237 12/26/14 1708 12/27/14 0041 12/27/14 1040 12/28/14 0514  NA 142 141 139 141 142  K 3.8 3.7 4.0 3.8 3.6  CL  --  109 110 112 109  CO2 19* 24 22 24 25   GLUCOSE 224* 163* 175* 136* 107*  BUN 10.0 18 16 15 11   CREATININE 1.1 0.99 0.95 0.97 0.97  CALCIUM 9.2 9.3 8.3* 8.2* 8.7  MG  --   --   --  1.1*  --   PHOS  --   --   --  2.4  --    Liver Function Tests:  Recent Labs Lab 12/24/14 1237 12/26/14 1708 12/27/14 0041 12/27/14 1040  AST 34 43* 32 27  ALT 22 26 21 19   ALKPHOS 228* 182* 140* 131*  BILITOT 0.47 0.9 1.2 1.2  PROT 5.9* 6.4 5.4* 5.4*  ALBUMIN 3.2* 3.3* 2.7* 2.7*  CBC:  Recent Labs Lab 12/24/14 1236 12/26/14 1708 12/27/14 0041 12/27/14 1040 12/28/14 0514  WBC 2.8* 1.6* 4.4 4.3 4.3  NEUTROABS 1.6 1.6* 4.2  --   --   HGB 10.9* 11.9* 9.5* 9.1* 9.2*  HCT 33.9* 36.0* 29.1* 27.7* 28.2*  MCV 97.3 96.5 98.3 98.6 98.3  PLT 114* 69* 73* 68* 73*   CBG:  Recent Labs Lab 12/27/14 2001 12/28/14 0041 12/28/14 0410 12/28/14 0756 12/28/14 1157  GLUCAP 143* 101* 98 109* 104*    Recent Results (from the past 240 hour(s))  Culture, blood (routine x 2)     Status: None (Preliminary result)   Collection Time: 12/26/14  5:25 PM  Result Value Ref Range Status   Specimen Description BLOOD LEFT HAND  Final   Special Requests BOTTLES DRAWN AEROBIC AND ANAEROBIC 5ML  Final   Culture   Final           BLOOD CULTURE RECEIVED NO GROWTH TO DATE CULTURE WILL BE HELD FOR 5 DAYS BEFORE ISSUING A FINAL NEGATIVE REPORT Performed at [facility]    Report Status PENDING  Incomplete  Culture, blood (routine x 2)     Status: None (Preliminary result)    Collection Time: 12/26/14  5:33 PM  Result Value Ref Range Status   Specimen Description BLOOD RIGHT HAND  Final   Special Requests BOTTLES DRAWN AEROBIC AND ANAEROBIC 5ML  Final   Culture   Final           BLOOD CULTURE RECEIVED NO GROWTH TO DATE CULTURE WILL BE HELD FOR 5 DAYS BEFORE ISSUING A FINAL NEGATIVE REPORT Performed at [facility]    Report Status PENDING  Incomplete  MRSA PCR Screening     Status: None   Collection Time: 12/27/14 12:01 AM  Result Value Ref Range Status   MRSA by PCR NEGATIVE NEGATIVE Final    Comment:        The GeneXpert MRSA Assay (FDA approved for NASAL specimens only), is one component of a comprehensive MRSA colonization surveillance program. It is not intended to diagnose MRSA infection nor to guide or monitor treatment for MRSA infections.      Scheduled Meds: . insulin aspart  0-9 Units Subcutaneous 6 times per day  . insulin glargine  10 Units Subcutaneous QHS  . pantoprazole  40 mg Oral Daily  . piperacillin-tazobactam (ZOSYN)  IV  3.375 g Intravenous Q8H  . sodium chloride  10-40 mL Intracatheter Q12H  . sodium chloride  3 mL Intravenous Q12H  . [START ON 12/29/2014] vancomycin  1,000 mg Intravenous Q12H   Continuous Infusions:    Marzetta Board, MD [facility] Pager [phone]. If 7 PM - 7 AM, please contact night-coverage at [email or web address], password [email or web address] 12/28/2014, 2:13 PM  LOS: 2 days

## 2014-12-28 NOTE — Progress Notes (Signed)
ANTIBIOTIC CONSULT NOTE - Follow up  Pharmacy Consult for:  Vancomycin, Zosyn Indication:  Sepsis  Allergies  Allergen Reactions  . Ace Inhibitors Other (See Comments)    Unknown  . Ciprofloxacin Rash    Patient Measurements: 12/26/13 - Height 74 inches   Height: 6\' 2"  (188 cm) Weight: 227 lb 11.8 oz (103.3 kg) IBW/kg (Calculated) : 82.2  Vital Signs: Temp: 97.3 F (36.3 C) (02/12 1300) Temp Source: Oral (02/12 1300) BP: 134/62 mmHg (02/12 1300) Pulse Rate: 83 (02/12 1300)  Labs:  Recent Labs  12/27/14 0041 12/27/14 1040 12/28/14 0514  WBC 4.4 4.3 4.3  HGB 9.5* 9.1* 9.2*  PLT 73* 68* 73*  CREATININE 0.95 0.97 0.97   Estimated Creatinine Clearance: 89.5 mL/min (by C-G formula based on Cr of 0.97).    Microbiology: Recent Results (from the past 720 hour(s))  Culture, blood (routine x 2)     Status: None (Preliminary result)   Collection Time: 12/26/14  5:25 PM  Result Value Ref Range Status   Specimen Description BLOOD LEFT HAND  Final   Special Requests BOTTLES DRAWN AEROBIC AND ANAEROBIC 5ML  Final   Culture   Final           BLOOD CULTURE RECEIVED NO GROWTH TO DATE CULTURE WILL BE HELD FOR 5 DAYS BEFORE ISSUING A FINAL NEGATIVE REPORT Performed at [facility]    Report Status PENDING  Incomplete  Culture, blood (routine x 2)     Status: None (Preliminary result)   Collection Time: 12/26/14  5:33 PM  Result Value Ref Range Status   Specimen Description BLOOD RIGHT HAND  Final   Special Requests BOTTLES DRAWN AEROBIC AND ANAEROBIC 5ML  Final   Culture   Final           BLOOD CULTURE RECEIVED NO GROWTH TO DATE CULTURE WILL BE HELD FOR 5 DAYS BEFORE ISSUING A FINAL NEGATIVE REPORT Performed at [facility]    Report Status PENDING  Incomplete  MRSA PCR Screening     Status: None   Collection Time: 12/27/14 12:01 AM  Result Value Ref Range Status   MRSA by PCR NEGATIVE NEGATIVE Final    Comment:        The GeneXpert MRSA Assay  (FDA approved for NASAL specimens only), is one component of a comprehensive MRSA colonization surveillance program. It is not intended to diagnose MRSA infection nor to guide or monitor treatment for MRSA infections.     Medical History: Past Medical History  Diagnosis Date  . Chronic back pain greater than 3 months duration   . Bulging discs   . Diabetes mellitus     type II; neuropathy;   . Hypertension   . High cholesterol   . Leukocytosis   . GERD (gastroesophageal reflux disease)   . Pancreatitis 2004  . Anxiety   . DDD (degenerative disc disease) 2005    lumbar spine  . History of smoking 08/01/2012  . Weight loss 08/01/2012  . Cough 08/01/2012  . Left leg pain 08/05/2012  . Chronic pancreatitis   . OSA (obstructive sleep apnea) 2010    does not use CPAP  . Nausea alone 06/13/2014  . Allergy   . Mucositis (ulcerative) due to antineoplastic therapy 07/02/2014  . Constipated 07/16/2014  . Gastric ulcer 08/23/2014  . Hx of radiation therapy 06/25/14-08/06/14    pancreas head, 50.4Gy  . 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 Pancreatic cancer  06/08/2014  . Chest pain 11/05/2014     Assessment:  Asked to assist with antibiotic therapy -- Zosyn and Vancomycin -- for this 74 year-old male with sepsis, and immunosuppression due to chemotherapy.  The last chemotherapy treatment with Abraxane and Gemzar was given on 12/24/14.  Neulasta 6 mg injection was administered this morning.  According to the medical record, Mr. Rana developed shaking chills, nausea and vomiting and generalized weakness on 2/10 when he was brought to ED.  CT of abdomen showed bilateral PNA  Today: Vanc trough 24.8 (supratherapeutic) after the 5th dose CrCl ~46mls/min, 70 (N)  Goals of Therapy:   Vancomycin trough levels 15-20 mcg/ml  Eradication of infection  Plan:   Decrease vancomycin to 1gm IV q12h  Continue Zosyn 3.375 grams IV q8h EI  Repeat  vanc trough as needed  Follow renal function/cultures/clinical course  Dolly Rias RPh 12/28/2014, 1:43 PM Pager [phone]    ,

## 2014-12-29 DIAGNOSIS — D72819 Decreased white blood cell count, unspecified: Secondary | ICD-10-CM

## 2014-12-29 DIAGNOSIS — D6959 Other secondary thrombocytopenia: Secondary | ICD-10-CM

## 2014-12-29 LAB — GLUCOSE, CAPILLARY
GLUCOSE-CAPILLARY: 90 mg/dL (ref 70–99)
GLUCOSE-CAPILLARY: 107 mg/dL — AB (ref 70–99)

## 2014-12-29 LAB — CBC
HCT: 28.5 % — ABNORMAL LOW (ref 39.0–52.0)
Hemoglobin: 9.4 g/dL — ABNORMAL LOW (ref 13.0–17.0)
MCH: 32 pg (ref 26.0–34.0)
MCHC: 33 g/dL (ref 30.0–36.0)
MCV: 96.9 fL (ref 78.0–100.0)
Platelets: 76 10*3/uL — ABNORMAL LOW (ref 150–400)
RBC: 2.94 MIL/uL — ABNORMAL LOW (ref 4.22–5.81)
RDW: 17.5 % — AB (ref 11.5–15.5)
WBC: 6.3 10*3/uL (ref 4.0–10.5)

## 2014-12-29 LAB — URINE CULTURE
Colony Count: NO GROWTH
Culture: NO GROWTH

## 2014-12-29 LAB — PROCALCITONIN: PROCALCITONIN: 9.83 ng/mL

## 2014-12-29 MED ORDER — HEPARIN SOD (PORK) LOCK FLUSH 100 UNIT/ML IV SOLN
500.0000 [IU] | INTRAVENOUS | Status: DC
  Administered 2014-12-29: 500 [IU]
  Filled 2014-12-29: qty 5

## 2014-12-29 MED ORDER — HEPARIN SOD (PORK) LOCK FLUSH 100 UNIT/ML IV SOLN
500.0000 [IU] | INTRAVENOUS | Status: DC | PRN
  Filled 2014-12-29: qty 5

## 2014-12-29 MED ORDER — CEFDINIR 300 MG PO CAPS: 300.0000 mg | ORAL_CAPSULE | Freq: Two times a day (BID) | ORAL | Status: DC

## 2014-12-29 NOTE — Evaluation (Addendum)
Physical Therapy Evaluation-1x Patient Details Name: Evan [name] MRN: [id number] DOB: 22-Aug-1941 Today's Date: 12/29/2014   History of Present Illness  74 yo male admitted with fever. Hx of CML, HTn, DM, pancreatic cancer, neuropathy, chronic back pain.   Clinical Impression  On eval, pt required Min guard assist for mobility-able to ambulate ~350 feet without an assistive device. Slightly unsteady at times but no LOB. Pt tolerated activity well. Do not feel pt has any follow up PT needs at discharge. Made RN aware. Pt awaiting d/c today.     Follow Up Recommendations No PT follow up;Supervision - Intermittent    Equipment Recommendations  None recommended by PT    Recommendations for Other Services       Precautions / Restrictions Precautions Precautions: Fall Restrictions Weight Bearing Restrictions: No      Mobility  Bed Mobility               General bed mobility comments: pt oob in recliner  Transfers Overall transfer level: Needs assistance   Transfers: Sit to/from Stand Sit to Stand: Supervision            Ambulation/Gait Ambulation/Gait assistance: Min guard Ambulation Distance (Feet): 350 Feet Assistive device: None Gait Pattern/deviations: Step-through pattern;Decreased stride length     General Gait Details: close guard for safety. slightly unsteady intermittently but no LOB. Pt tolerated well.   Stairs            Wheelchair Mobility    Modified Rankin (Stroke Patients Only)       Balance           Standing balance support: No upper extremity supported;During functional activity Standing balance-Leahy Scale: Good Standing balance comment: Had pt perform 360 degree turn, static standing with EO/EC, narrow BOS static standing-Min guard but no LOB.                              Pertinent Vitals/Pain Pain Assessment: No/denies pain    Home Living Family/patient expects to be discharged to:: Private  residence Living Arrangements: Spouse/significant other Available Help at Discharge: Family Type of Home: House Home Access: Stairs to enter Entrance Stairs-Rails: Right Entrance Stairs-Number of Steps: 8-garage; 6-front Home Layout: One level Home Equipment: Environmental consultant - 2 wheels;Cane - single point      Prior Function Level of Independence: Independent               Hand Dominance        Extremity/Trunk Assessment   Upper Extremity Assessment: Overall WFL for tasks assessed           Lower Extremity Assessment: Generalized weakness      Cervical / Trunk Assessment: Normal  Communication   Communication: No difficulties  Cognition Arousal/Alertness: Awake/alert Behavior During Therapy: WFL for tasks assessed/performed Overall Cognitive Status: Within Functional Limits for tasks assessed                      General Comments      Exercises        Assessment/Plan    PT Assessment Patent does not need any further PT services  PT Diagnosis     PT Problem List    PT Treatment Interventions     PT Goals (Current goals can be found in the Care Plan section) Acute Rehab PT Goals Patient Stated Goal: home PT Goal Formulation: All assessment and education complete, DC therapy  Frequency     Barriers to discharge        Co-evaluation               End of Session Equipment Utilized During Treatment: Gait belt Activity Tolerance: Patient tolerated treatment well Patient left: in chair;with call bell/phone within reach;with family/visitor present           Time: 1020-1034 PT Time Calculation (min) (ACUTE ONLY): 14 min   Charges:   PT Evaluation $Initial PT Evaluation Tier I: 1 Procedure     PT G Codes:        Weston Anna, MPT Pager: [phone]

## 2014-12-29 NOTE — Discharge Instructions (Signed)
Follow with [name], MD in 5-7 days  Please get a complete blood count and chemistry panel checked by your Primary MD at your next visit, and again as instructed by your Primary MD. Please get your medications reviewed and adjusted by your Primary MD.  Please request your Primary MD to go over all Hospital Tests and Procedure/Radiological results at the follow up, please get all Hospital records sent to your Prim MD by signing hospital release before you go home.  If you had Pneumonia of Lung problems at the Hospital: Please get a 2 view Chest X ray done in 6-8 weeks after hospital discharge or sooner if instructed by your Primary MD.  If you have Congestive Heart Failure: Please call your Cardiologist or Primary MD anytime you have any of the following symptoms:  1) 3 pound weight gain in 24 hours or 5 pounds in 1 week  2) shortness of breath, with or without a dry hacking cough  3) swelling in the hands, feet or stomach  4) if you have to sleep on extra pillows at night in order to breathe  Follow cardiac low salt diet and 1.5 lit/day fluid restriction.  If you have diabetes Accuchecks 4 times/day, Once in AM empty stomach and then before each meal. Log in all results and show them to your primary doctor at your next visit. If any glucose reading is under 80 or above 300 call your primary MD immediately.  If you have Seizure/Convulsions/Epilepsy: Please do not drive, operate heavy machinery, participate in activities at heights or participate in high speed sports until you have seen by Primary MD or a Neurologist and advised to do so again.  If you had Gastrointestinal Bleeding: Please ask your Primary MD to check a complete blood count within one week of discharge or at your next visit. Your endoscopic/colonoscopic biopsies that are pending at the time of discharge, will also need to followed by your Primary MD.  Get Medicines reviewed and adjusted. Please take all your  medications with you for your next visit with your Primary MD  Please request your Primary MD to go over all hospital tests and procedure/radiological results at the follow up, please ask your Primary MD to get all Hospital records sent to his/her office.  If you experience worsening of your admission symptoms, develop shortness of breath, life threatening emergency, suicidal or homicidal thoughts you must seek medical attention immediately by calling 911 or calling your MD immediately  if symptoms less severe.  You must read complete instructions/literature along with all the possible adverse reactions/side effects for all the Medicines you take and that have been prescribed to you. Take any new Medicines after you have completely understood and accpet all the possible adverse reactions/side effects.   Do not drive or operate heavy machinery when taking Pain medications.   Do not take more than prescribed Pain, Sleep and Anxiety Medications  Special Instructions: If you have smoked or chewed Tobacco  in the last 2 yrs please stop smoking, stop any regular Alcohol  and or any Recreational drug use.  Wear Seat belts while driving.  Please note You were cared for by a hospitalist during your hospital stay. If you have any questions about your discharge medications or the care you received while you were in the hospital after you are discharged, you can call the unit and asked to speak with the hospitalist on call if the hospitalist that took care of you is not available. Once  you are discharged, your primary care physician will handle any further medical issues. Please note that NO REFILLS for any discharge medications will be authorized once you are discharged, as it is imperative that you return to your primary care physician (or establish a relationship with a primary care physician if you do not have one) for your aftercare needs so that they can reassess your need for medications and monitor your  lab values.  You can reach the hospitalist office at phone [phone] or fax [phone]   If you do not have a primary care physician, you can call [phone] for a physician referral.  Activity: As tolerated with Full fall precautions use walker/cane & assistance as needed  Diet: regular  Disposition Home

## 2014-12-29 NOTE — Discharge Summary (Signed)
Physician Discharge Summary  Evan [name]:[id number] DOB: [birth year] DOA: 12/26/2014  PCP: [name], MD  Admit date: 12/26/2014 Discharge date: 12/29/2014  Time spent: 45 minutes  Recommendations for Outpatient Follow-up:  1. Follow up with Dr. Laurann Montana in 2-4 weeks 2. Follow up with Dr. Alvy Bimler in 1 week as scheduled  Discharge Diagnoses:  Active Problems:   Hypertension   Diabetes mellitus with neuropathy   CKD stage 3 due to type 2 diabetes mellitus   Malignant neoplasm of head of pancreas   Leukopenia due to antineoplastic chemotherapy   Thrombocytopenia due to drugs   Neutropenic fever   Sepsis   HCAP (healthcare-associated pneumonia)  Discharge Condition: stable  Diet recommendation: regular  Filed Weights   12/26/14 1636 12/27/14 0100  Weight: 100.699 kg (222 lb) 103.3 kg (227 lb 11.8 oz)   History of present illness:  Evan [name] is a 74 y.o. male has a past medical history of Chronic back pain greater than 3 months duration; Bulging discs; Diabetes mellitus; Hypertension; High cholesterol; Leukocytosis; GERD (gastroesophageal reflux disease); Pancreatitis (2004); Anxiety; DDD (degenerative disc disease) (2005); History of smoking (08/01/2012); Weight loss (08/01/2012); Cough (08/01/2012); Left leg pain (08/05/2012); Chronic pancreatitis; OSA (obstructive sleep apnea) (2010); Nausea alone (06/13/2014); Allergy; Mucositis (ulcerative) due to antineoplastic therapy (07/02/2014); Constipated (07/16/2014); Gastric ulcer (08/23/2014); radiation therapy (06/25/14-08/06/14); 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   Presented with Patient with hx of Pancreatic cancer sp recent Chemo and was given Nuel[place] today. Today he developed shaking chills and generalized weakness. He was brought to [facility] ER. Found to be febrile up to 104. AN count 1.6 In ER showing evidence of  sepsis with tachycardia and fever up to 104 with increased somnolence. His BP was noted down to 93/56. He has been complaining of some cough and had some nausea. CT of abdomen showed bilateral PNA possible aspiration? . He reports two soft stools today That have resolved. CT Hospitalist was called for admission for Sepsis due to [place] Hospital Course:  He was admitted to the hospital with sepsis in the setting of healthcare associated pneumonia. He was started on broad-spectrum antibiotics, oxygen support, and was monitored stepdown for the first 24 hours. After the initial fever in the emergency room, patient was afebrile and has rapidly improved, his oxygen was being able to be weaned off shortly after admission. He was transferred to the floor where he remained stable, back to baseline, without any further shortness of breath, hypotension or fever. He was initially on vancomycin and Zosyn for 3 days, then his antibiotics were narrowed to cefdinir and he was discharged home in stable condition to complete an additional 5 days of antibiotics CKD stage 3 due to type 2 diabetes mellitus - currently stable  Diabetes mellitus with neuropathy - resume home medications Leukopenia due to antineoplastic chemotherapy - patient received Neupogen 2/10 Malignant neoplasm of head of pancreas - followed by Dr. Alvy Bimler as an outpatient Thrombocytopenia- stable, improving  Procedures:  None    Consultations:  None   Discharge Exam: Filed Vitals:   12/28/14 0411 12/28/14 1300 12/28/14 2024 12/29/14 0412  BP: 142/66 134/62 145/68 142/76  Pulse: 82 83 85 80  Temp: 98.7 F (37.1 C) 97.3 F (36.3 C) 98.6 F (37 C) 98.6 F (37 C)  TempSrc: Oral Oral Oral Oral  Resp: 18 18 20 20   Height:      Weight:  SpO2: 98% 99% 96% 94%   General: NAD Cardiovascular: RRR Respiratory: CTA biL  Discharge Instructions     Medication List    TAKE these medications        aspirin EC 81 MG tablet  Take  81 mg by mouth daily.     cefdinir 300 MG capsule  Commonly known as:  OMNICEF  Take 1 capsule (300 mg total) by mouth 2 (two) times daily.     cholecalciferol 1000 UNITS tablet  Commonly known as:  VITAMIN D  Take 1,000 Units by mouth daily.     dasatinib 50 MG tablet  Commonly known as:  SPRYCEL  Take 50 mg by mouth daily.     diphenhydrAMINE 25 mg capsule  Commonly known as:  BENADRYL  Take 25-50 mg by mouth every 6 (six) hours as needed for itching (itching).     hydrocortisone cream 1 %  Apply 1 application topically daily as needed for itching (itching).     HYDROmorphone 4 MG tablet  Commonly known as:  DILAUDID  Take 4 mg by mouth every 4 (four) hours as needed for severe pain (pain).     insulin glargine 100 UNIT/ML injection  Commonly known as:  LANTUS  Inject 26 Units into the skin at bedtime.     lactulose 10 GM/15ML solution  Commonly known as:  CHRONULAC  Take 10 g by mouth 2 (two) times daily as needed for moderate constipation.     losartan 50 MG tablet  Commonly known as:  COZAAR  Take 50 mg by mouth daily.     metFORMIN 1000 MG tablet  Commonly known as:  GLUCOPHAGE  Take 1,000 mg by mouth 2 (two) times daily with a meal.     pantoprazole 40 MG tablet  Commonly known as:  PROTONIX  Take 1 tablet (40 mg total) by mouth daily.     promethazine 25 MG tablet  Commonly known as:  PHENERGAN  Take 25 mg by mouth every 6 (six) hours as needed for nausea or vomiting (nausea).     sennosides-docusate sodium 8.6-50 MG tablet  Commonly known as:  SENOKOT-S  Take 2 tablets by mouth at bedtime.     simvastatin 20 MG tablet  Commonly known as:  ZOCOR  Take 20 mg by mouth daily.     SUPER B COMPLEX/VITAMIN C PO  Take 1 tablet by mouth daily.     vitamin B-12 1000 MCG tablet  Commonly known as:  CYANOCOBALAMIN  Take 1,000 mcg by mouth daily.           Follow-up Information    Follow up with [name], MD. Schedule an appointment as soon  as possible for a visit in 2 weeks.   Specialty:  Internal Medicine   Contact information:   [address] [place] [address] [phone]       Follow up with [facility], [place], MD. Schedule an appointment as soon as possible for a visit in 1 week.   Specialty:  Hematology and Oncology   Contact information:   Mobile [phone]       The results of significant diagnostics from this hospitalization (including imaging, microbiology, ancillary and laboratory) are listed below for reference.    Significant Diagnostic Studies: Ct Chest W Contrast  12/14/2014   CLINICAL DATA:  Malignant neoplasm of head of pancreas  EXAM: CT CHEST, ABDOMEN, AND PELVIS WITH CONTRAST  TECHNIQUE: Multidetector CT imaging  of the chest, abdomen and pelvis was performed following the standard protocol during bolus administration of intravenous contrast.  CONTRAST:  164mL OMNIPAQUE IOHEXOL 300 MG/ML  SOLN  COMPARISON:  10/18/2014  FINDINGS: CT CHEST FINDINGS  Mediastinum: The heart size appears normal. No pericardial effusion. Calcified atherosclerotic disease involves the thoracic aorta as well as the LAD, left circumflex and RCA Coronary artery. Calcified sub- carinal lymph node identified. No mediastinal or hilar adenopathy.  Lungs/Pleura: Small left pleural effusion. There are moderate changes of centrilobular and paraseptal emphysema. Subpleural nodule along the oblique fissure is unchanged measuring 6 mm, image 18/series 7. Left lower lobe nodule measures 9 mm, image 36/ series 7. Also unchanged. Right middle lobe nodule measures 4 mm, image 42/series 7. Stable.  Musculoskeletal: There is no aggressive lytic or sclerotic bone lesion.  CT ABDOMEN AND PELVIS FINDINGS  Hepatobiliary: Multifocal liver metastases are again identified. Index lesion in segment 8 measures 1.8 cm, image 80/series 6. Previously 1.7 cm. Index lesion within segment 7 measures 2.5 cm, image 86/  series 6. Previously 1.8 cm. Segment 4B lesion measures 2.9 cm, image 91/ series 6. Previously 1.9 cm. Previous cholecystectomy. Common bile duct stent is in place. Pneumobilia is noted.  Pancreas: 2.4 cm low attenuation structure in the body of pancreas is noted, image 38/ series 2. Previously 2.1 cm. Atrophy of the pancreatic tail is again noted.  Spleen: Normal appearance of the spleen.  Adrenals/Urinary Tract: Right adrenal nodule measures 1.4 cm and is stable from previous exam. Normal appearance of the left adrenal gland. Similar appearance of right renal cysts. The left kidney is unremarkable. There is diffuse wall thickening involving the partially collapsed urinary bladder.  Stomach/Bowel: The stomach and the small bowel loops have a normal course and caliber. No bowel obstruction. The appendix is visualized and appears normal. Normal appearance of the colon.  Vascular/Lymphatic: Calcified atherosclerotic disease involves the abdominal aorta. No aneurysm. No enlarged upper abdominal lymph nodes. No pelvic or inguinal adenopathy.  Reproductive: Prostate gland enlargement is identified.  Other: There is no ascites or focal fluid collections within the abdomen or pelvis. No peritoneal nodularity identified.  Musculoskeletal: No aggressive lytic or sclerotic bone lesions identified.  IMPRESSION: 1. Mild increase in size of multi focal liver lesions. 2. Low attenuation structure in the body of pancreas is slightly increased in size from previous exam. 3. Stable left lower lobe pulmonary nodule. 4. Atherosclerotic disease.   Electronically Signed   By: Kerby Moors M.D.   On: 12/14/2014 10:47   Ct Abdomen Pelvis W Contrast  12/26/2014   CLINICAL DATA:  Fever, nausea and vomiting. History of metastatic pancreatic carcinoma.  EXAM: CT ABDOMEN AND PELVIS WITH CONTRAST  TECHNIQUE: Multidetector CT imaging of the abdomen and pelvis was performed using the standard protocol following bolus administration of  intravenous contrast.  CONTRAST:  44mL OMNIPAQUE IOHEXOL 300 MG/ML SOLN, 158mL OMNIPAQUE IOHEXOL 300 MG/ML SOLN  COMPARISON:  CT of the chest, abdomen and pelvis on 12/14/2014  FINDINGS: Visualized lung bases showed consolidation in the posterior left lower lobe with some areas of small cavitation and associated small left pleural effusion. Findings are concerning for acute pneumonia and the only abnormality present at the lung bases previously was a trace amount of pleural fluid. Similar changes are also seen at the posterior right lung base but to a lesser degree with associated trace right pleural effusion. Bilateral airspace abnormalities in the lower lungs may suggest potential aspiration and correlation suggested clinically.  Multiple  liver metastases are again noted in both lobes of the liver. Lesions are similar in size compared to the recent restaging CT. Metal stent in the common bile duct continues to show patency with air present in the biliary tree. No evidence of biliary obstruction.  Low-density mass in the body of the pancreas is similar and size in appearance with maximal diameter of approximately 3 cm. There is evidence of atrophy of the tail of the pancreas.  Compared to the prior study, there is some more nonspecific stranding in the retroperitoneum and also the mesentery without focal fluid collection or ascites. This is nonspecific but may relate to some relative third spacing of fluid. Bowel shows no evidence of obstruction or perforation. Stable small right adrenal nodule. Stable degenerative disc disease at L3-4. No bony metastasis identified.  IMPRESSION: 1. New pulmonary consolidation in both posterior lower lobes, left greater than right with associated small pleural effusions. Findings are consistent with pneumonia and suggest potential aspiration. 2. Similar CT appearance of metastatic disease throughout the liver and similar sized of pancreatic carcinoma. 3. Increased prominence of  stranding in the retroperitoneum and mesentery. This is nonspecific and is not associated with overt evidence of carcinomatosis or ascites. This may relate to some increased third spacing of fluid.   Electronically Signed   By: Aletta Edouard M.D.   On: 12/26/2014 21:26   Ct Abdomen Pelvis W Contrast  12/14/2014   CLINICAL DATA:  Malignant neoplasm of head of pancreas  EXAM: CT CHEST, ABDOMEN, AND PELVIS WITH CONTRAST  TECHNIQUE: Multidetector CT imaging of the chest, abdomen and pelvis was performed following the standard protocol during bolus administration of intravenous contrast.  CONTRAST:  170mL OMNIPAQUE IOHEXOL 300 MG/ML  SOLN  COMPARISON:  10/18/2014  FINDINGS: CT CHEST FINDINGS  Mediastinum: The heart size appears normal. No pericardial effusion. Calcified atherosclerotic disease involves the thoracic aorta as well as the LAD, left circumflex and RCA Coronary artery. Calcified sub- carinal lymph node identified. No mediastinal or hilar adenopathy.  Lungs/Pleura: Small left pleural effusion. There are moderate changes of centrilobular and paraseptal emphysema. Subpleural nodule along the oblique fissure is unchanged measuring 6 mm, image 18/series 7. Left lower lobe nodule measures 9 mm, image 36/ series 7. Also unchanged. Right middle lobe nodule measures 4 mm, image 42/series 7. Stable.  Musculoskeletal: There is no aggressive lytic or sclerotic bone lesion.  CT ABDOMEN AND PELVIS FINDINGS  Hepatobiliary: Multifocal liver metastases are again identified. Index lesion in segment 8 measures 1.8 cm, image 80/series 6. Previously 1.7 cm. Index lesion within segment 7 measures 2.5 cm, image 86/ series 6. Previously 1.8 cm. Segment 4B lesion measures 2.9 cm, image 91/ series 6. Previously 1.9 cm. Previous cholecystectomy. Common bile duct stent is in place. Pneumobilia is noted.  Pancreas: 2.4 cm low attenuation structure in the body of pancreas is noted, image 38/ series 2. Previously 2.1 cm. Atrophy of  the pancreatic tail is again noted.  Spleen: Normal appearance of the spleen.  Adrenals/Urinary Tract: Right adrenal nodule measures 1.4 cm and is stable from previous exam. Normal appearance of the left adrenal gland. Similar appearance of right renal cysts. The left kidney is unremarkable. There is diffuse wall thickening involving the partially collapsed urinary bladder.  Stomach/Bowel: The stomach and the small bowel loops have a normal course and caliber. No bowel obstruction. The appendix is visualized and appears normal. Normal appearance of the colon.  Vascular/Lymphatic: Calcified atherosclerotic disease involves the abdominal aorta. No aneurysm. No  enlarged upper abdominal lymph nodes. No pelvic or inguinal adenopathy.  Reproductive: Prostate gland enlargement is identified.  Other: There is no ascites or focal fluid collections within the abdomen or pelvis. No peritoneal nodularity identified.  Musculoskeletal: No aggressive lytic or sclerotic bone lesions identified.  IMPRESSION: 1. Mild increase in size of multi focal liver lesions. 2. Low attenuation structure in the body of pancreas is slightly increased in size from previous exam. 3. Stable left lower lobe pulmonary nodule. 4. Atherosclerotic disease.   Electronically Signed   By: Kerby Moors M.D.   On: 12/14/2014 10:47   Dg Chest Port 1 View  12/26/2014   CLINICAL DATA:  Fever. Currently on chemotherapy for pancreatic cancer. History of chronic myelocytic leukemia. Previous smoker.  EXAM: PORTABLE CHEST - 1 VIEW  COMPARISON:  08/13/2014  FINDINGS: Left central venous catheter is unchanged in position. Shallow inspiration. Mild increased heart size and pulmonary vascularity. No focal consolidation or edema. No blunting of costophrenic angles. No pneumothorax. Calcified aorta appear  IMPRESSION: Shallow inspiration. Mild cardiac enlargement and pulmonary vascular congestion. No edema or consolidation.   Electronically Signed   By: Lucienne Capers M.D.   On: 12/26/2014 18:17    Microbiology: Recent Results (from the past 240 hour(s))  Culture, blood (routine x 2)     Status: None (Preliminary result)   Collection Time: 12/26/14  5:25 PM  Result Value Ref Range Status   Specimen Description BLOOD LEFT HAND  Final   Special Requests BOTTLES DRAWN AEROBIC AND ANAEROBIC 5ML  Final   Culture   Final           BLOOD CULTURE RECEIVED NO GROWTH TO DATE CULTURE WILL BE HELD FOR 5 DAYS BEFORE ISSUING A FINAL NEGATIVE REPORT Performed at [facility]    Report Status PENDING  Incomplete  Culture, blood (routine x 2)     Status: None (Preliminary result)   Collection Time: 12/26/14  5:33 PM  Result Value Ref Range Status   Specimen Description BLOOD RIGHT HAND  Final   Special Requests BOTTLES DRAWN AEROBIC AND ANAEROBIC 5ML  Final   Culture   Final           BLOOD CULTURE RECEIVED NO GROWTH TO DATE CULTURE WILL BE HELD FOR 5 DAYS BEFORE ISSUING A FINAL NEGATIVE REPORT Performed at [facility]    Report Status PENDING  Incomplete  Urine culture     Status: None   Collection Time: 12/26/14 10:04 PM  Result Value Ref Range Status   Specimen Description URINE, CLEAN CATCH  Final   Special Requests NONE  Final   Colony Count NO GROWTH Performed at [facility]   Final   Culture NO GROWTH Performed at [facility]   Final   Report Status 12/29/2014 FINAL  Final  MRSA PCR Screening     Status: None   Collection Time: 12/27/14 12:01 AM  Result Value Ref Range Status   MRSA by PCR NEGATIVE NEGATIVE Final    Comment:        The GeneXpert MRSA Assay (FDA approved for NASAL specimens only), is one component of a comprehensive MRSA colonization surveillance program. It is not intended to diagnose MRSA infection nor to guide or monitor treatment for MRSA infections.      Labs: Basic Metabolic Panel:  Recent Labs Lab 12/24/14 1237 12/26/14 1708 12/27/14 0041 12/27/14 1040  12/28/14 0514  NA 142 141 139 141 142  K 3.8 3.7  4.0 3.8 3.6  CL  --  109 110 112 109  CO2 19* 24 22 24 25   GLUCOSE 224* 163* 175* 136* 107*  BUN 10.0 18 16 15 11   CREATININE 1.1 0.99 0.95 0.97 0.97  CALCIUM 9.2 9.3 8.3* 8.2* 8.7  MG  --   --   --  1.1*  --   PHOS  --   --   --  2.4  --    Liver Function Tests:  Recent Labs Lab 12/24/14 1237 12/26/14 1708 12/27/14 0041 12/27/14 1040  AST 34 43* 32 27  ALT 22 26 21 19   ALKPHOS 228* 182* 140* 131*  BILITOT 0.47 0.9 1.2 1.2  PROT 5.9* 6.4 5.4* 5.4*  ALBUMIN 3.2* 3.3* 2.7* 2.7*   CBC:  Recent Labs Lab 12/24/14 1236 12/26/14 1708 12/27/14 0041 12/27/14 1040 12/28/14 0514 12/29/14 0500  WBC 2.8* 1.6* 4.4 4.3 4.3 6.3  NEUTROABS 1.6 1.6* 4.2  --   --   --   HGB 10.9* 11.9* 9.5* 9.1* 9.2* 9.4*  HCT 33.9* 36.0* 29.1* 27.7* 28.2* 28.5*  MCV 97.3 96.5 98.3 98.6 98.3 96.9  PLT 114* 69* 73* 68* 73* 76*   CBG:  Recent Labs Lab 12/28/14 1637 12/28/14 2022 12/28/14 2333 12/29/14 0411 12/29/14 0734  GLUCAP 131* 142* 126* 90 107*       Signed:  Stori Royse  [facility] 12/29/2014, 4:05 PM

## 2014-12-31 ENCOUNTER — Telehealth: Payer: Self-pay | Admitting: *Deleted

## 2014-12-31 NOTE — Telephone Encounter (Signed)
Wife reports pt is in bed,  Feeling weak and tired, but no fevers today.  We discussed it would be expected for pt to be recovering from his infection and to be resting after his recent hospitlization.  She states pt taking Cefdinar 30 mg twice daily as directed.  Informed wife pt can be seen by Dr. Alvy Bimler this afternoon if she feels he is doing any worse or she has any concerns.  Wife says she feels pt is recovering and as long as he doesn't have any more fevers, she feels comfortable letting him recover at home and will keep appt next week on 2/22 as scheduled.  Instructed wife to call us for any fevers or concerns before next week.  She verbalized understanding.

## 2014-12-31 NOTE — Telephone Encounter (Signed)
OK 

## 2015-01-01 ENCOUNTER — Encounter: Payer: Self-pay | Admitting: *Deleted

## 2015-01-01 LAB — CULTURE, BLOOD (ROUTINE X 2): Culture: NO GROWTH

## 2015-01-01 NOTE — Progress Notes (Signed)
Fax from [employer], they shipped pt's Sprycel for delivery tomorrow 2/16

## 2015-01-07 ENCOUNTER — Ambulatory Visit: Payer: Medicare Other

## 2015-01-07 ENCOUNTER — Other Ambulatory Visit (HOSPITAL_BASED_OUTPATIENT_CLINIC_OR_DEPARTMENT_OTHER): Payer: Medicare Other

## 2015-01-07 ENCOUNTER — Ambulatory Visit (HOSPITAL_BASED_OUTPATIENT_CLINIC_OR_DEPARTMENT_OTHER): Payer: Medicare Other | Admitting: Hematology and Oncology

## 2015-01-07 ENCOUNTER — Ambulatory Visit (HOSPITAL_BASED_OUTPATIENT_CLINIC_OR_DEPARTMENT_OTHER): Payer: Medicare Other

## 2015-01-07 ENCOUNTER — Telehealth: Payer: Self-pay | Admitting: *Deleted

## 2015-01-07 ENCOUNTER — Ambulatory Visit: Payer: Medicare Other | Admitting: Podiatry

## 2015-01-07 ENCOUNTER — Other Ambulatory Visit: Payer: Self-pay | Admitting: *Deleted

## 2015-01-07 ENCOUNTER — Telehealth: Payer: Self-pay | Admitting: Hematology and Oncology

## 2015-01-07 VITALS — BP 156/68 | HR 99 | Temp 97.6°F | Resp 18 | Ht 74.0 in | Wt 230.8 lb

## 2015-01-07 DIAGNOSIS — Z95828 Presence of other vascular implants and grafts: Secondary | ICD-10-CM

## 2015-01-07 DIAGNOSIS — C250 Malignant neoplasm of head of pancreas: Secondary | ICD-10-CM

## 2015-01-07 DIAGNOSIS — G620 Drug-induced polyneuropathy: Secondary | ICD-10-CM

## 2015-01-07 DIAGNOSIS — Z5111 Encounter for antineoplastic chemotherapy: Secondary | ICD-10-CM

## 2015-01-07 DIAGNOSIS — E1140 Type 2 diabetes mellitus with diabetic neuropathy, unspecified: Secondary | ICD-10-CM

## 2015-01-07 DIAGNOSIS — D630 Anemia in neoplastic disease: Secondary | ICD-10-CM

## 2015-01-07 DIAGNOSIS — T451X5A Adverse effect of antineoplastic and immunosuppressive drugs, initial encounter: Secondary | ICD-10-CM

## 2015-01-07 LAB — CBC WITH DIFFERENTIAL/PLATELET
BASO%: 0.2 % (ref 0.0–2.0)
BASOS ABS: 0 10*3/uL (ref 0.0–0.1)
EOS%: 0.6 % (ref 0.0–7.0)
Eosinophils Absolute: 0.1 10*3/uL (ref 0.0–0.5)
HEMATOCRIT: 33.8 % — AB (ref 38.4–49.9)
HEMOGLOBIN: 11 g/dL — AB (ref 13.0–17.1)
LYMPH%: 8.5 % — ABNORMAL LOW (ref 14.0–49.0)
MCH: 32.4 pg (ref 27.2–33.4)
MCHC: 32.5 g/dL (ref 32.0–36.0)
MCV: 99.7 fL — ABNORMAL HIGH (ref 79.3–98.0)
MONO#: 0.9 10*3/uL (ref 0.1–0.9)
MONO%: 6.4 % (ref 0.0–14.0)
NEUT#: 11.8 10*3/uL — ABNORMAL HIGH (ref 1.5–6.5)
NEUT%: 84.3 % — AB (ref 39.0–75.0)
Platelets: 232 10*3/uL (ref 140–400)
RBC: 3.39 10*6/uL — ABNORMAL LOW (ref 4.20–5.82)
RDW: 20.6 % — ABNORMAL HIGH (ref 11.0–14.6)
WBC: 14 10*3/uL — AB (ref 4.0–10.3)
lymph#: 1.2 10*3/uL (ref 0.9–3.3)

## 2015-01-07 LAB — COMPREHENSIVE METABOLIC PANEL (CC13)
ALBUMIN: 3 g/dL — AB (ref 3.5–5.0)
ALT: 11 U/L (ref 0–55)
AST: 27 U/L (ref 5–34)
Alkaline Phosphatase: 220 U/L — ABNORMAL HIGH (ref 40–150)
Anion Gap: 13 mEq/L — ABNORMAL HIGH (ref 3–11)
BILIRUBIN TOTAL: 0.55 mg/dL (ref 0.20–1.20)
BUN: 11.6 mg/dL (ref 7.0–26.0)
CALCIUM: 9.5 mg/dL (ref 8.4–10.4)
CO2: 23 meq/L (ref 22–29)
Chloride: 110 mEq/L — ABNORMAL HIGH (ref 98–109)
Creatinine: 1.1 mg/dL (ref 0.7–1.3)
EGFR: 67 mL/min/{1.73_m2} — ABNORMAL LOW (ref >90)
Glucose: 168 mg/dl — ABNORMAL HIGH (ref 70–140)
POTASSIUM: 3.4 meq/L — AB (ref 3.5–5.1)
Sodium: 146 mEq/L — ABNORMAL HIGH (ref 136–145)
TOTAL PROTEIN: 6 g/dL — AB (ref 6.4–8.3)

## 2015-01-07 MED ORDER — SODIUM CHLORIDE 0.9 % IJ SOLN
10.0000 mL | INTRAMUSCULAR | Status: DC | PRN
  Administered 2015-01-07: 10 mL via INTRAVENOUS
  Filled 2015-01-07: qty 10

## 2015-01-07 MED ORDER — PACLITAXEL PROTEIN-BOUND CHEMO INJECTION 100 MG
80.0000 mg/m2 | Freq: Once | INTRAVENOUS | Status: AC
  Administered 2015-01-07: 175 mg via INTRAVENOUS
  Filled 2015-01-07: qty 35

## 2015-01-07 MED ORDER — HEPARIN SOD (PORK) LOCK FLUSH 100 UNIT/ML IV SOLN
500.0000 [IU] | Freq: Once | INTRAVENOUS | Status: AC | PRN
  Administered 2015-01-07: 500 [IU]
  Filled 2015-01-07: qty 5

## 2015-01-07 MED ORDER — ONDANSETRON 8 MG/50ML IVPB (CHCC)
8.0000 mg | Freq: Once | INTRAVENOUS | Status: AC
  Administered 2015-01-07: 8 mg via INTRAVENOUS

## 2015-01-07 MED ORDER — DEXAMETHASONE SODIUM PHOSPHATE 10 MG/ML IJ SOLN
INTRAMUSCULAR | Status: AC
  Filled 2015-01-07: qty 1

## 2015-01-07 MED ORDER — SODIUM CHLORIDE 0.9 % IV SOLN
Freq: Once | INTRAVENOUS | Status: AC
  Administered 2015-01-07: 11:00:00 via INTRAVENOUS

## 2015-01-07 MED ORDER — SODIUM CHLORIDE 0.9 % IJ SOLN
10.0000 mL | INTRAMUSCULAR | Status: DC | PRN
  Administered 2015-01-07: 10 mL
  Filled 2015-01-07: qty 10

## 2015-01-07 MED ORDER — SODIUM CHLORIDE 0.9 % IV SOLN
800.0000 mg/m2 | Freq: Once | INTRAVENOUS | Status: AC
  Administered 2015-01-07: 1824 mg via INTRAVENOUS
  Filled 2015-01-07: qty 47.97

## 2015-01-07 MED ORDER — ONDANSETRON 8 MG/NS 50 ML IVPB
INTRAVENOUS | Status: AC
  Filled 2015-01-07: qty 8

## 2015-01-07 MED ORDER — DEXAMETHASONE SODIUM PHOSPHATE 10 MG/ML IJ SOLN
10.0000 mg | Freq: Once | INTRAMUSCULAR | Status: AC
  Administered 2015-01-07: 10 mg via INTRAVENOUS

## 2015-01-07 NOTE — Assessment & Plan Note (Signed)
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 He will continue dose adjusted treatment with abraxane

## 2015-01-07 NOTE — Telephone Encounter (Signed)
Pt confirmed labs/ov per 02/22 POF, gave pt AVS... KJ, sent msg to add chemo °

## 2015-01-07 NOTE — Assessment & Plan Note (Signed)
His blood sugar appears to be reasonably controlled. We will continue to monitor to closely while he is on treatment.

## 2015-01-07 NOTE — Progress Notes (Signed)
[place] OFFICE PROGRESS NOTE  Patient Care Team: [name], MD as PCP - General (Internal Medicine) [name], MD as Referring Physician (Specialist) Provider Not In System [name], MD as Consulting Physician (Hematology and Oncology)  SUMMARY OF ONCOLOGIC HISTORY: Oncology History   Pancreatic cancer   Primary site: Pancreas   Staging method: AJCC 7th Edition   Clinical: Stage IIA (T3, N0, M0) signed by [name], MD on 06/13/2014  4:37 PM   Summary: Stage IIA (T3, N0, M0)       Malignant neoplasm of head of pancreas   06/05/2014 Imaging  MRI abdomen showed 2.2 cm lesion in the pancreatic head obstructing the common bile duct and the pancreatic duct is highly concerning for pancreatic adenocarcinoma.  There is moderate intra and extrahepatic biliary duct dilatation.    06/05/2014 Tumor Marker CA 19-9 is elevated at 1234   06/06/2014 Imaging Ct scan of chest showed 2 lung nodules, likely benign   06/07/2014 Procedure He had ERCP and stent placement   06/07/2014 Procedure He had endoscopic ultrasound with  fine needle aspiration.   06/07/2014 Pathology Results Accession: [id number] FNA is positive for pancreatic adenoca   06/14/2014 Surgery He has port placement   06/25/2014 - 08/01/2014 Chemotherapy He received neoadjuvant chemotherapy with Gemzar   06/25/2014 - 08/06/2014 Radiation Therapy he received neoadjuvant radiation: 50.4 Gy   08/13/2014 - 08/23/2014 Hospital Admission The patient was admitted to the hospital related to uncontrolled nausea, vomiting and severe epigastric pain and was found to have significant gastric ulcer, resolved with conservative management and total parenteral nutrition   09/06/2014 Imaging Repeat CT scan show enlarging lung nodule, suspicious for early metastatic disease.   10/18/2014 Imaging CT scan of the chest, abdomen and pelvis show metastatic disease in the lung and liver.   10/18/2014 Tumor Marker CA-19-9 at 1745   10/22/2014 -  12/03/2014 Chemotherapy The patient is placed on modified FOLFOX chemotherapy   11/05/2014 Tumor Marker Ca19-9 at 2447   12/14/2014 Imaging CT scan showed disease progression in the liver   12/14/2014 Tumor Marker CA 19-9 at 2050   12/17/2014 -  Chemotherapy the patient was starting on Abraxane with reduced dose Gemzar   12/24/2014 Tumor Marker CA-19-9 is at 1387.   12/26/2014 - 12/29/2014 Hospital Admission the patient was admitted to the hospital for hospital-acquired pneumonia.   01/07/2015 Adverse Reaction cycle 2 of treatment with reduced dose Abraxane due to recent hospitalization    INTERVAL HISTORY: Please see below for problem oriented charting. He is seen prior to cycle 2 of chemotherapy. After cycle 1 of treatment, he had complication with pneumonia. Overall, his symptoms has resolved. He complained of very mild worsening peripheral neuropathy.  REVIEW OF SYSTEMS:   Constitutional: Denies fevers, chills or abnormal weight loss Eyes: Denies blurriness of vision Ears, nose, mouth, throat, and face: Denies mucositis or sore throat Respiratory: Denies cough, dyspnea or wheezes Cardiovascular: Denies palpitation, chest discomfort or lower extremity swelling Gastrointestinal:  Denies nausea, heartburn or change in bowel habits Skin: Denies abnormal skin rashes Lymphatics: Denies new lymphadenopathy or easy bruising Behavioral/Psych: Mood is stable, no new changes  All other systems were reviewed with the patient and are negative.  I have reviewed the past medical history, past surgical history, social history and family history with the patient and they are unchanged from previous note.  ALLERGIES:  is allergic to ace inhibitors and ciprofloxacin.  MEDICATIONS:  Current Outpatient Prescriptions  Medication Sig Dispense Refill  .  aspirin EC 81 MG tablet Take 81 mg by mouth daily.    . B Complex-C (SUPER B COMPLEX/VITAMIN C PO) Take 1 tablet by mouth daily.     . cefdinir (OMNICEF) 300  MG capsule Take 1 capsule (300 mg total) by mouth 2 (two) times daily. 10 capsule 0  . cholecalciferol (VITAMIN D) 1000 UNITS tablet Take 1,000 Units by mouth daily.    . dasatinib (SPRYCEL) 50 MG tablet Take 50 mg by mouth daily.     . hydrocortisone cream 1 % Apply 1 application topically daily as needed for itching (itching).     . insulin glargine (LANTUS) 100 UNIT/ML injection Inject 26 Units into the skin at bedtime.     Marland Kitchen losartan (COZAAR) 50 MG tablet Take 50 mg by mouth daily.  11  . metFORMIN (GLUCOPHAGE) 1000 MG tablet Take 1,000 mg by mouth 2 (two) times daily with a meal.    . pantoprazole (PROTONIX) 40 MG tablet Take 1 tablet (40 mg total) by mouth daily. 60 tablet 6  . sennosides-docusate sodium (SENOKOT-S) 8.6-50 MG tablet Take 2 tablets by mouth at bedtime.     . simvastatin (ZOCOR) 20 MG tablet Take 20 mg by mouth daily.     . vitamin B-12 (CYANOCOBALAMIN) 1000 MCG tablet Take 1,000 mcg by mouth daily.    . diphenhydrAMINE (BENADRYL) 25 mg capsule Take 25-50 mg by mouth every 6 (six) hours as needed for itching (itching).     Marland Kitchen HYDROmorphone (DILAUDID) 4 MG tablet Take 4 mg by mouth every 4 (four) hours as needed for severe pain (pain).    Marland Kitchen lactulose (CHRONULAC) 10 GM/15ML solution Take 10 g by mouth 2 (two) times daily as needed for moderate constipation.     . promethazine (PHENERGAN) 25 MG tablet Take 25 mg by mouth every 6 (six) hours as needed for nausea or vomiting (nausea).     No current facility-administered medications for this visit.   Facility-Administered Medications Ordered in Other Visits  Medication Dose Route Frequency Provider Last Rate Last Dose  . heparin lock flush 100 unit/mL  500 Units Intravenous Once [name], MD      . sodium chloride 0.9 % injection 10 mL  10 mL Intravenous PRN [name], MD        PHYSICAL EXAMINATION: ECOG PERFORMANCE STATUS: 1 - Symptomatic but completely ambulatory  Filed Vitals:   01/07/15 1025  BP: 156/68  Pulse: 99   Temp: 97.6 F (36.4 C)  Resp: 18   Filed Weights   01/07/15 1025  Weight: 230 lb 12.8 oz (104.69 kg)    GENERAL:alert, no distress and comfortable SKIN: skin color, texture, turgor are normal, no rashes or significant lesions EYES: normal, Conjunctiva are pink and non-injected, sclera clear OROPHARYNX:no exudate, no erythema and lips, buccal mucosa, and tongue normal  NECK: supple, thyroid normal size, non-tender, without nodularity LYMPH:  no palpable lymphadenopathy in the cervical, axillary or inguinal LUNGS: clear to auscultation and percussion with normal breathing effort HEART: regular rate & rhythm and no murmurs and no lower extremity edema ABDOMEN:abdomen soft, non-tender and normal bowel sounds Musculoskeletal:no cyanosis of digits and no clubbing  NEURO: alert & oriented x 3 with fluent speech, no focal motor/sensory deficits  LABORATORY DATA:  I have reviewed the data as listed    Component Value Date/Time   NA 146* 01/07/2015 0929   NA 142 12/28/2014 0514   K 3.4* 01/07/2015 0929   K 3.6 12/28/2014 0514  CL 109 12/28/2014 0514   CL 107 04/27/2013 0926   CO2 23 01/07/2015 0929   CO2 25 12/28/2014 0514   GLUCOSE 168* 01/07/2015 0929   GLUCOSE 107* 12/28/2014 0514   GLUCOSE 212* 04/27/2013 0926   BUN 11.6 01/07/2015 0929   BUN 11 12/28/2014 0514   CREATININE 1.1 01/07/2015 0929   CREATININE 0.97 12/28/2014 0514   CALCIUM 9.5 01/07/2015 0929   CALCIUM 8.7 12/28/2014 0514   PROT 6.0* 01/07/2015 0929   PROT 5.4* 12/27/2014 1040   ALBUMIN 3.0* 01/07/2015 0929   ALBUMIN 2.7* 12/27/2014 1040   AST 27 01/07/2015 0929   AST 27 12/27/2014 1040   ALT 11 01/07/2015 0929   ALT 19 12/27/2014 1040   ALKPHOS 220* 01/07/2015 0929   ALKPHOS 131* 12/27/2014 1040   BILITOT 0.55 01/07/2015 0929   BILITOT 1.2 12/27/2014 1040   GFRNONAA 81* 12/28/2014 0514   GFRAA >90 12/28/2014 0514    No results found for: SPEP, UPEP  Lab Results  Component Value Date   WBC  14.0* 01/07/2015   NEUTROABS 11.8* 01/07/2015   HGB 11.0* 01/07/2015   HCT 33.8* 01/07/2015   MCV 99.7* 01/07/2015   PLT 232 01/07/2015      Chemistry      Component Value Date/Time   NA 146* 01/07/2015 0929   NA 142 12/28/2014 0514   K 3.4* 01/07/2015 0929   K 3.6 12/28/2014 0514   CL 109 12/28/2014 0514   CL 107 04/27/2013 0926   CO2 23 01/07/2015 0929   CO2 25 12/28/2014 0514   BUN 11.6 01/07/2015 0929   BUN 11 12/28/2014 0514   CREATININE 1.1 01/07/2015 0929   CREATININE 0.97 12/28/2014 0514      Component Value Date/Time   CALCIUM 9.5 01/07/2015 0929   CALCIUM 8.7 12/28/2014 0514   ALKPHOS 220* 01/07/2015 0929   ALKPHOS 131* 12/27/2014 1040   AST 27 01/07/2015 0929   AST 27 12/27/2014 1040   ALT 11 01/07/2015 0929   ALT 19 12/27/2014 1040   BILITOT 0.55 01/07/2015 0929   BILITOT 1.2 12/27/2014 1040     ASSESSMENT & PLAN:  Malignant neoplasm of head of pancreas His recenrttreatment was complicated by hospitalization with pneumonia. I plan to further reduce the dose of chemotherapy with Abraxane -20% along with reduced dose Gemzar as before. He will continue G-CSF support with each cycle. I plan to give him minimum 3 cycles of treatment before repeating imaging study.   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 The patient denies recent history of bleeding such as epistaxis, hematuria or hematochezia. [name] I will observe for now.  He does not require transfusion now.   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 He will continue dose adjusted treatment with abraxane    Diabetes mellitus with neuropathy His blood sugar appears to be reasonably controlled. We will continue to monitor to closely while he is on treatment.    Orders Placed This Encounter  Procedures  . Comprehensive metabolic panel    Standing Status: Standing     Number  of Occurrences: 22     Standing Expiration Date: 01/08/2016   All questions were answered. The patient knows to call the clinic with any problems, questions or concerns. No barriers to learning was detected. I spent 40 minutes counseling the patient face to face. The total time spent in the appointment was 55 minutes  and more than 50% was on counseling and review of test results     [facility], Loyd Marhefka, MD 01/07/2015 4:46 PM

## 2015-01-07 NOTE — Telephone Encounter (Signed)
Per staff message and POF I have scheduled appts. Advised scheduler of appts. JMW  

## 2015-01-07 NOTE — Assessment & Plan Note (Signed)
His recenrttreatment was complicated by hospitalization with pneumonia. I plan to further reduce the dose of chemotherapy with Abraxane -20% along with reduced dose Gemzar as before. He will continue G-CSF support with each cycle. I plan to give him minimum 3 cycles of treatment before repeating imaging study.

## 2015-01-07 NOTE — Patient Instructions (Signed)

## 2015-01-07 NOTE — Assessment & Plan Note (Signed)
This is likely due to recent treatment. The patient denies recent history of bleeding such as epistaxis, hematuria or hematochezia. [name] I will observe for now.  He does not require transfusion now. 

## 2015-01-07 NOTE — Patient Instructions (Signed)
[place] Discharge Instructions for Patients Receiving Chemotherapy  Today you received the following chemotherapy agents Abraxane/Gemzar  To help prevent nausea and vomiting after your treatment, we encourage you to take your nausea medication as prescribed.   If you develop nausea and vomiting that is not controlled by your nausea medication, call the clinic.   BELOW ARE SYMPTOMS THAT SHOULD BE REPORTED IMMEDIATELY:  *FEVER GREATER THAN 100.5 F  *CHILLS WITH OR WITHOUT FEVER  NAUSEA AND VOMITING THAT IS NOT CONTROLLED WITH YOUR NAUSEA MEDICATION  *UNUSUAL SHORTNESS OF BREATH  *UNUSUAL BRUISING OR BLEEDING  TENDERNESS IN MOUTH AND THROAT WITH OR WITHOUT PRESENCE OF ULCERS  *URINARY PROBLEMS  *BOWEL PROBLEMS  UNUSUAL RASH Items with * indicate a potential emergency and should be followed up as soon as possible.  Feel free to call the clinic you have any questions or concerns. The clinic phone number is [phone].

## 2015-01-08 ENCOUNTER — Emergency Department (HOSPITAL_COMMUNITY)
Admission: EM | Admit: 2015-01-08 | Discharge: 2015-01-09 | Disposition: A | Discharge status: Home / Self Care | Payer: Medicare Other | Attending: Emergency Medicine | Admitting: Emergency Medicine

## 2015-01-08 ENCOUNTER — Emergency Department (HOSPITAL_COMMUNITY): Payer: Medicare Other

## 2015-01-08 ENCOUNTER — Encounter (HOSPITAL_COMMUNITY): Payer: Self-pay | Admitting: Emergency Medicine

## 2015-01-08 DIAGNOSIS — R509 Fever, unspecified: Secondary | ICD-10-CM | POA: Insufficient documentation

## 2015-01-08 DIAGNOSIS — K219 Gastro-esophageal reflux disease without esophagitis: Secondary | ICD-10-CM | POA: Diagnosis not present

## 2015-01-08 DIAGNOSIS — Z8507 Personal history of malignant neoplasm of pancreas: Secondary | ICD-10-CM | POA: Diagnosis not present

## 2015-01-08 DIAGNOSIS — Z8739 Personal history of other diseases of the musculoskeletal system and connective tissue: Secondary | ICD-10-CM | POA: Insufficient documentation

## 2015-01-08 DIAGNOSIS — Z7982 Long term (current) use of aspirin: Secondary | ICD-10-CM | POA: Insufficient documentation

## 2015-01-08 DIAGNOSIS — Z87891 Personal history of nicotine dependence: Secondary | ICD-10-CM | POA: Diagnosis not present

## 2015-01-08 DIAGNOSIS — I10 Essential (primary) hypertension: Secondary | ICD-10-CM | POA: Insufficient documentation

## 2015-01-08 DIAGNOSIS — Z794 Long term (current) use of insulin: Secondary | ICD-10-CM | POA: Diagnosis not present

## 2015-01-08 DIAGNOSIS — Z862 Personal history of diseases of the blood and blood-forming organs and certain disorders involving the immune mechanism: Secondary | ICD-10-CM | POA: Insufficient documentation

## 2015-01-08 DIAGNOSIS — E119 Type 2 diabetes mellitus without complications: Secondary | ICD-10-CM | POA: Insufficient documentation

## 2015-01-08 DIAGNOSIS — E780 Pure hypercholesterolemia: Secondary | ICD-10-CM | POA: Insufficient documentation

## 2015-01-08 DIAGNOSIS — G8929 Other chronic pain: Secondary | ICD-10-CM | POA: Insufficient documentation

## 2015-01-08 DIAGNOSIS — Z8659 Personal history of other mental and behavioral disorders: Secondary | ICD-10-CM | POA: Diagnosis not present

## 2015-01-08 DIAGNOSIS — Z856 Personal history of leukemia: Secondary | ICD-10-CM | POA: Insufficient documentation

## 2015-01-08 LAB — URINALYSIS, ROUTINE W REFLEX MICROSCOPIC
Bilirubin Urine: NEGATIVE
Glucose, UA: NEGATIVE mg/dL
Hgb urine dipstick: NEGATIVE
Ketones, ur: NEGATIVE mg/dL
LEUKOCYTES UA: NEGATIVE
NITRITE: NEGATIVE
PROTEIN: NEGATIVE mg/dL
SPECIFIC GRAVITY, URINE: 1.011 (ref 1.005–1.030)
Urobilinogen, UA: 1 mg/dL (ref 0.0–1.0)
pH: 5.5 (ref 5.0–8.0)

## 2015-01-08 LAB — BASIC METABOLIC PANEL
Anion gap: 8 (ref 5–15)
BUN: 21 mg/dL (ref 6–23)
CHLORIDE: 108 mmol/L (ref 96–112)
CO2: 27 mmol/L (ref 19–32)
Calcium: 9.5 mg/dL (ref 8.4–10.5)
Creatinine, Ser: 1.42 mg/dL — ABNORMAL HIGH (ref 0.50–1.35)
GFR calc Af Amer: 55 mL/min — ABNORMAL LOW (ref >90)
GFR, EST NON AFRICAN AMERICAN: 48 mL/min — AB (ref >90)
Glucose, Bld: 224 mg/dL — ABNORMAL HIGH (ref 70–99)
POTASSIUM: 3.7 mmol/L (ref 3.5–5.1)
Sodium: 143 mmol/L (ref 135–145)

## 2015-01-08 LAB — CBC WITH DIFFERENTIAL/PLATELET
BASOS PCT: 0 % (ref 0–1)
Basophils Absolute: 0 10*3/uL (ref 0.0–0.1)
EOS PCT: 0 % (ref 0–5)
Eosinophils Absolute: 0 10*3/uL (ref 0.0–0.7)
HCT: 32.6 % — ABNORMAL LOW (ref 39.0–52.0)
Hemoglobin: 10.6 g/dL — ABNORMAL LOW (ref 13.0–17.0)
LYMPHS ABS: 0.5 10*3/uL — AB (ref 0.7–4.0)
Lymphocytes Relative: 6 % — ABNORMAL LOW (ref 12–46)
MCH: 33 pg (ref 26.0–34.0)
MCHC: 32.5 g/dL (ref 30.0–36.0)
MCV: 101.6 fL — ABNORMAL HIGH (ref 78.0–100.0)
MONOS PCT: 4 % (ref 3–12)
Monocytes Absolute: 0.4 10*3/uL (ref 0.1–1.0)
NEUTROS PCT: 90 % — AB (ref 43–77)
Neutro Abs: 8 10*3/uL — ABNORMAL HIGH (ref 1.7–7.7)
Platelets: 187 10*3/uL (ref 150–400)
RBC: 3.21 MIL/uL — AB (ref 4.22–5.81)
RDW: 20.1 % — ABNORMAL HIGH (ref 11.5–15.5)
WBC: 8.9 10*3/uL (ref 4.0–10.5)

## 2015-01-08 LAB — I-STAT CG4 LACTIC ACID, ED: LACTIC ACID, VENOUS: 1.84 mmol/L (ref 0.5–2.0)

## 2015-01-08 MED ORDER — SODIUM CHLORIDE 0.9 % IV BOLUS (SEPSIS)
1000.0000 mL | Freq: Once | INTRAVENOUS | Status: AC
  Administered 2015-01-08: 1000 mL via INTRAVENOUS

## 2015-01-08 MED ORDER — HEPARIN SOD (PORK) LOCK FLUSH 100 UNIT/ML IV SOLN
500.0000 [IU] | Freq: Once | INTRAVENOUS | Status: DC
  Filled 2015-01-08: qty 5

## 2015-01-08 MED ORDER — SODIUM CHLORIDE 0.9 % IV BOLUS (SEPSIS): 1000.0000 mL | Freq: Once | INTRAVENOUS | Status: DC

## 2015-01-08 NOTE — ED Provider Notes (Signed)
CSN: [id number]     Arrival date & time 01/08/15  2014 History   First MD Initiated Contact with Patient 01/08/15 2027     Chief Complaint  Patient presents with  . Fever     (Consider location/radiation/quality/duration/timing/severity/associated sxs/prior Treatment) HPI Comments: 74 year old male with history of chronic kidney disease, CML, pancreatic cancer with metastasis, chemotherapy yesterday presents with fever. Patient had fever up to 100.8 this evening. Patient generally felt unwell or feels improved since. No fever since prior to arrival. No cough or urinary symptoms no new rashes. No current antibiotics.  Patient is a 74 y.o. male presenting with fever. The history is provided by the patient.  Fever Associated symptoms: no chest pain, no chills, no congestion, no dysuria, no headaches, no rash and no vomiting     Past Medical History  Diagnosis Date  . Chronic back pain greater than 3 months duration   . Bulging discs   . Diabetes mellitus     type II; neuropathy;   . Hypertension   . High cholesterol   . Leukocytosis   . GERD (gastroesophageal reflux disease)   . Pancreatitis 2004  . Anxiety   . DDD (degenerative disc disease) 2005    lumbar spine  . History of smoking 08/01/2012  . Weight loss 08/01/2012  . Cough 08/01/2012  . Left leg pain 08/05/2012  . Chronic pancreatitis   . OSA (obstructive sleep apnea) 2010    does not use CPAP  . Nausea alone 06/13/2014  . Allergy   . Mucositis (ulcerative) due to antineoplastic therapy 07/02/2014  . Constipated 07/16/2014  . Gastric ulcer 08/23/2014  . Hx of radiation therapy 06/25/14-08/06/14    pancreas head, 50.4Gy  . 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 Pancreatic cancer 06/08/2014  . Chest pain 11/05/2014   Past Surgical History  Procedure Laterality Date  . Cholecystectomy      laparoscopic  . Hernia repair      umbilical  . Skin cancer removed  2012    right ear, basal  cell carcinoma.   Marland Kitchen Spinal stretching    . Colonoscopy  2012    [facility]  . Eus Left 06/07/2014    Procedure: UPPER ENDOSCOPIC ULTRASOUND (EUS) LINEAR;  Surgeon: Arta Silence, MD;  Location: [facility] ENDOSCOPY;  Service: Endoscopy;  Laterality: Left;  . Ercp N/A 06/07/2014    Procedure: ENDOSCOPIC RETROGRADE CHOLANGIOPANCREATOGRAPHY (ERCP);  Surgeon: Arta Silence, MD;  Location: [facility];  Service: Endoscopy;  Laterality: N/A;  . Eye surgery      bilateral cataracts with lens implant  . Cataract extraction Bilateral   . Port acath placement Left 06/14/14  . Portacath placement Left 06/14/2014    Procedure: INSERTION PORT-A-CATH;  Surgeon: Stark Klein, MD;  Location: [facility] ORS;  Service: General;  Laterality: Left;  . Esophagogastroduodenoscopy N/A 08/15/2014    Procedure: ESOPHAGOGASTRODUODENOSCOPY (EGD);  Surgeon: Lear Ng, MD;  Location: [facility];  Service: Endoscopy;  Laterality: N/A;   Family History  Problem Relation Age of Onset  . Heart attack Mother   . Cancer Mother 50    lung  . Heart attack Father   . Stroke Father    History  Substance Use Topics  . Smoking status: Former Smoker -- 1.50 packs/day for 40 years    Quit date: 01/24/2004  . Smokeless tobacco: Never Used  . Alcohol Use: No    Review of Systems  Constitutional: Positive for fever. Negative for chills.  HENT: Negative for congestion.   Eyes: Negative for visual disturbance.  Respiratory: Negative for shortness of breath.   Cardiovascular: Negative for chest pain.  Gastrointestinal: Negative for vomiting and abdominal pain.  Genitourinary: Negative for dysuria and flank pain.  Musculoskeletal: Negative for back pain, neck pain and neck stiffness.  Skin: Negative for rash.  Neurological: Negative for light-headedness and headaches.      Allergies  Ace inhibitors and Ciprofloxacin  Home Medications   Prior to Admission medications   Medication Sig Start Date End Date Taking? Authorizing  Provider  acetaminophen (TYLENOL) 500 MG tablet Take 1,000 mg by mouth every 6 (six) hours as needed for fever.   Yes Historical Provider, MD  aspirin EC 81 MG tablet Take 81 mg by mouth daily.   Yes Historical Provider, MD  B Complex-C (SUPER B COMPLEX/VITAMIN C PO) Take 1 tablet by mouth daily.    Yes Historical Provider, MD  cholecalciferol (VITAMIN D) 1000 UNITS tablet Take 1,000 Units by mouth daily.   Yes Historical Provider, MD  dasatinib (SPRYCEL) 50 MG tablet Take 50 mg by mouth daily.  01/15/14  Yes [name], MD  diphenhydrAMINE (BENADRYL) 25 mg capsule Take 25-50 mg by mouth every 6 (six) hours as needed for itching (itching).    Yes Historical Provider, MD  hydrocortisone cream 1 % Apply 1 application topically daily as needed for itching (itching).    Yes Historical Provider, MD  HYDROmorphone (DILAUDID) 4 MG tablet Take 4 mg by mouth every 4 (four) hours as needed for severe pain (pain).   Yes Historical Provider, MD  insulin glargine (LANTUS) 100 UNIT/ML injection Inject 26 Units into the skin at bedtime.  08/09/14  Yes Modena Jansky, MD  lactulose (CHRONULAC) 10 GM/15ML solution Take 10 g by mouth 2 (two) times daily as needed for moderate constipation.  06/21/14  Yes Historical Provider, MD  losartan (COZAAR) 50 MG tablet Take 50 mg by mouth daily. 12/06/14  Yes Historical Provider, MD  metFORMIN (GLUCOPHAGE) 1000 MG tablet Take 1,000 mg by mouth 2 (two) times daily with a meal.   Yes Historical Provider, MD  pantoprazole (PROTONIX) 40 MG tablet Take 1 tablet (40 mg total) by mouth daily. 08/23/14  Yes [name], MD  PRESCRIPTION MEDICATION Chemo at [facility] 01/07/15.   Yes Historical Provider, MD  promethazine (PHENERGAN) 25 MG tablet Take 25 mg by mouth every 6 (six) hours as needed for nausea or vomiting (nausea).   Yes Historical Provider, MD  sennosides-docusate sodium (SENOKOT-S) 8.6-50 MG tablet Take 2 tablets by mouth at bedtime.    Yes Historical Provider, MD  simvastatin (ZOCOR)  20 MG tablet Take 20 mg by mouth daily.    Yes Historical Provider, MD  vitamin B-12 (CYANOCOBALAMIN) 1000 MCG tablet Take 1,000 mcg by mouth daily.   Yes Historical Provider, MD  cefdinir (OMNICEF) 300 MG capsule Take 1 capsule (300 mg total) by mouth 2 (two) times daily. Patient not taking: Reported on 01/08/2015 12/29/14   [name], MD   BP 139/61 mmHg  Pulse 91  Temp(Src) 98.8 F (37.1 C) (Oral)  Resp 18  SpO2 93% Physical Exam  Constitutional: He is oriented to person, place, and time. He appears well-developed and well-nourished.  HENT:  Head: Normocephalic and atraumatic.  Mild dry mucous membranes  Eyes: Conjunctivae are normal. Right eye exhibits no discharge. Left eye exhibits no discharge.  Neck: Normal range of motion. Neck supple. No tracheal deviation present.  Cardiovascular: Normal rate  and regular rhythm.   Pulmonary/Chest: Effort normal and breath sounds normal.  Abdominal: Soft. He exhibits no distension. There is no tenderness. There is no guarding.  Musculoskeletal: He exhibits no edema.  Neurological: He is alert and oriented to person, place, and time.  Skin: Skin is warm. No rash noted.  Psychiatric: He has a normal mood and affect.  Nursing note and vitals reviewed.   ED Course  Procedures (including critical care time) Labs Review Labs Reviewed  BASIC METABOLIC PANEL - Abnormal; Notable for the following:    Glucose, Bld 224 (*)    Creatinine, Ser 1.42 (*)    GFR calc non Af Amer 48 (*)    GFR calc Af Amer 55 (*)    All other components within normal limits  CBC WITH DIFFERENTIAL/PLATELET - Abnormal; Notable for the following:    RBC 3.21 (*)    Hemoglobin 10.6 (*)    HCT 32.6 (*)    MCV 101.6 (*)    RDW 20.1 (*)    Neutrophils Relative % 90 (*)    Neutro Abs 8.0 (*)    Lymphocytes Relative 6 (*)    Lymphs Abs 0.5 (*)    All other components within normal limits  CULTURE, BLOOD (ROUTINE X 2)  CULTURE, BLOOD (ROUTINE X 2)  URINALYSIS,  ROUTINE W REFLEX MICROSCOPIC  I-STAT CG4 LACTIC ACID, ED    Imaging Review Dg Chest Portable 1 View  01/08/2015   CLINICAL DATA:  Acute severe today, history of pancreas cancer. Receiving chemotherapy.  EXAM: PORTABLE CHEST - 1 VIEW  COMPARISON:  12/26/2014  FINDINGS: Background emphysema noted. Mild cardiomegaly with vascular and interstitial prominence, slightly worse. Developing edema suspected. No effusion, collapse or consolidation. No pneumothorax. Left subclavian power port catheter tip proximal SVC. Trachea is midline. Atherosclerosis of the aorta.  IMPRESSION: Mild cardiomegaly with mild developing interstitial edema.   Electronically Signed   By: Jerilynn Mages.  Shick M.D.   On: 01/08/2015 21:53     EKG Interpretation None      MDM   Final diagnoses:  Fever, unspecified fever cause  Pancreatic CA  Patient presents after low-grade fever, recent chemotherapy. Patient afebrile in the ER, blood work reviewed mild anemia, white blood cell count normal. Discussed with Dr. Earlie Server hematologist oncologist who recommended holding on antibiotics and they will follow closely outpatient. Urinalysis and x-ray reviewed no obvious source of fever. Blood culture sent.  Results and differential diagnosis were discussed with the patient/parent/guardian. Close follow up outpatient was discussed, comfortable with the plan.   Medications  sodium chloride 0.9 % bolus 1,000 mL (not administered)  sodium chloride 0.9 % bolus 1,000 mL (1,000 mLs Intravenous New Bag/Given 01/08/15 2145)    Filed Vitals:   01/08/15 2130 01/08/15 2200 01/08/15 2230 01/08/15 2236  BP: 150/62 138/60 139/61   Pulse: 96 91 91   Temp:    98.8 F (37.1 C)  TempSrc:    Oral  Resp: 18     SpO2: 96% 95% 93%     Final diagnoses:  Fever, unspecified fever cause        Mariea Clonts, MD 01/08/15 2343

## 2015-01-08 NOTE — Discharge Instructions (Signed)
Follow-up closely with your oncologist next 48 hours. Return to the ER for persistent fever or new concerns.  If you were given medicines take as directed.  If you are on coumadin or contraceptives realize their levels and effectiveness is altered by many different medicines.  If you have any reaction (rash, tongues swelling, other) to the medicines stop taking and see a physician.   Please follow up as directed and return to the ER or see a physician for new or worsening symptoms.  Thank you. Filed Vitals:   01/08/15 2130 01/08/15 2200 01/08/15 2230 01/08/15 2236  BP: 150/62 138/60 139/61   Pulse: 96 91 91   Temp:    98.8 F (37.1 C)  TempSrc:    Oral  Resp: 18     SpO2: 96% 95% 93%

## 2015-01-08 NOTE — ED Notes (Signed)
Pt reports having chemo on last wed. Pt began running fever today around 0430p and was given Tylenol at home. Pt reports eating chicken earlier today and reports "I don't feel good." Pt wife brought pt in after speaking with oncology triage nurse due to pt temp of 100.8 oral.

## 2015-01-09 ENCOUNTER — Ambulatory Visit (INDEPENDENT_AMBULATORY_CARE_PROVIDER_SITE_OTHER): Payer: Medicare Other | Admitting: Podiatry

## 2015-01-09 ENCOUNTER — Encounter: Payer: Self-pay | Admitting: Podiatry

## 2015-01-09 DIAGNOSIS — B351 Tinea unguium: Secondary | ICD-10-CM

## 2015-01-09 DIAGNOSIS — E0840 Diabetes mellitus due to underlying condition with diabetic neuropathy, unspecified: Secondary | ICD-10-CM | POA: Diagnosis not present

## 2015-01-09 NOTE — Progress Notes (Signed)
Patient ID: Evan [name], male   DOB: 1941-02-24, 73 y.o.   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

## 2015-01-09 NOTE — Patient Instructions (Signed)
Diabetes and Foot Care Diabetes may cause you to have problems because of poor blood supply (circulation) to your feet and legs. This may cause the skin on your feet to become thinner, break easier, and heal more slowly. Your skin may become dry, and the skin may peel and crack. You may also have nerve damage in your legs and feet causing decreased feeling in them. You may not notice minor injuries to your feet that could lead to infections or more serious problems. Taking care of your feet is one of the most important things you can do for yourself.  HOME CARE INSTRUCTIONS  Wear shoes at all times, even in the house. Do not go barefoot. Bare feet are easily injured.  Check your feet daily for blisters, cuts, and redness. If you cannot see the bottom of your feet, use a mirror or ask someone for help.  Wash your feet with warm water (do not use hot water) and mild soap. Then pat your feet and the areas between your toes until they are completely dry. Do not soak your feet as this can dry your skin.  Apply a moisturizing lotion or petroleum jelly (that does not contain alcohol and is unscented) to the skin on your feet and to dry, brittle toenails. Do not apply lotion between your toes.  Trim your toenails straight across. Do not dig under them or around the cuticle. File the edges of your nails with an emery board or nail file.  Do not cut corns or calluses or try to remove them with medicine.  Wear clean socks or stockings every day. Make sure they are not too tight. Do not wear knee-high stockings since they may decrease blood flow to your legs.  Wear shoes that fit properly and have enough cushioning. To break in new shoes, wear them for just a few hours a day. This prevents you from injuring your feet. Always look in your shoes before you put them on to be sure there are no objects inside.  Do not cross your legs. This may decrease the blood flow to your feet.  If you find a minor scrape,  cut, or break in the skin on your feet, keep it and the skin around it clean and dry. These areas may be cleansed with mild soap and water. Do not cleanse the area with peroxide, alcohol, or iodine.  When you remove an adhesive bandage, be sure not to damage the skin around it.  If you have a wound, look at it several times a day to make sure it is healing.  Do not use heating pads or hot water bottles. They may burn your skin. If you have lost feeling in your feet or legs, you may not know it is happening until it is too late.  Make sure your health care provider performs a complete foot exam at least annually or more often if you have foot problems. Report any cuts, sores, or bruises to your health care provider immediately. SEEK MEDICAL CARE IF:   You have an injury that is not healing.  You have cuts or breaks in the skin.  You have an ingrown nail.  You notice redness on your legs or feet.  You feel burning or tingling in your legs or feet.  You have pain or cramps in your legs and feet.  Your legs or feet are numb.  Your feet always feel cold. SEEK IMMEDIATE MEDICAL CARE IF:   There is increasing redness,   swelling, or pain in or around a wound.  There is a red line that goes up your leg.  Pus is coming from a wound.  You develop a fever or as directed by your health care provider.  You notice a bad smell coming from an ulcer or wound. Document Released: 10/30/2000 Document Revised: 07/05/2013 Document Reviewed: 04/11/2013 Exit[employer] Patient Information 2015 [employer]. This information is not intended to replace advice given to you by your health care provider. Make sure you discuss any questions you have with your health care provider.  

## 2015-01-10 ENCOUNTER — Telehealth: Payer: Self-pay | Admitting: *Deleted

## 2015-01-10 NOTE — Telephone Encounter (Signed)
Patient's wife called to let Dr. Alvy Bimler know that the patient went to the ED on 01/08/15 with an elevated temperature.  He had several vials of blood drawn and a chest x-ray. She said she would discuss the results with Dr. Alvy Bimler next week.  He has lab and chemo scheduled for 01/14/15.

## 2015-01-11 ENCOUNTER — Ambulatory Visit (HOSPITAL_BASED_OUTPATIENT_CLINIC_OR_DEPARTMENT_OTHER): Payer: Medicare Other | Admitting: Nurse Practitioner

## 2015-01-11 ENCOUNTER — Ambulatory Visit (HOSPITAL_BASED_OUTPATIENT_CLINIC_OR_DEPARTMENT_OTHER): Payer: Medicare Other

## 2015-01-11 ENCOUNTER — Ambulatory Visit (HOSPITAL_BASED_OUTPATIENT_CLINIC_OR_DEPARTMENT_OTHER)
Admission: RE | Admit: 2015-01-11 | Discharge: 2015-01-11 | Disposition: A | Discharge status: Home / Self Care | Payer: Medicare Other | Source: Ambulatory Visit | Attending: Nurse Practitioner | Admitting: Nurse Practitioner

## 2015-01-11 ENCOUNTER — Other Ambulatory Visit: Payer: Self-pay | Admitting: *Deleted

## 2015-01-11 ENCOUNTER — Telehealth: Payer: Self-pay | Admitting: Hematology and Oncology

## 2015-01-11 ENCOUNTER — Ambulatory Visit: Payer: Medicare Other

## 2015-01-11 ENCOUNTER — Telehealth: Payer: Self-pay

## 2015-01-11 VITALS — BP 144/77 | HR 101 | Temp 98.9°F | Resp 20 | Wt 224.7 lb

## 2015-01-11 DIAGNOSIS — Z881 Allergy status to other antibiotic agents status: Secondary | ICD-10-CM

## 2015-01-11 DIAGNOSIS — E860 Dehydration: Secondary | ICD-10-CM

## 2015-01-11 DIAGNOSIS — C250 Malignant neoplasm of head of pancreas: Secondary | ICD-10-CM

## 2015-01-11 DIAGNOSIS — C9211 Chronic myeloid leukemia, BCR/ABL-positive, in remission: Secondary | ICD-10-CM | POA: Diagnosis not present

## 2015-01-11 DIAGNOSIS — M549 Dorsalgia, unspecified: Secondary | ICD-10-CM | POA: Diagnosis present

## 2015-01-11 DIAGNOSIS — Z87891 Personal history of nicotine dependence: Secondary | ICD-10-CM

## 2015-01-11 DIAGNOSIS — L03115 Cellulitis of right lower limb: Secondary | ICD-10-CM | POA: Diagnosis present

## 2015-01-11 DIAGNOSIS — G8929 Other chronic pain: Secondary | ICD-10-CM | POA: Diagnosis present

## 2015-01-11 DIAGNOSIS — Z8711 Personal history of peptic ulcer disease: Secondary | ICD-10-CM

## 2015-01-11 DIAGNOSIS — E46 Unspecified protein-calorie malnutrition: Secondary | ICD-10-CM | POA: Diagnosis present

## 2015-01-11 DIAGNOSIS — C9210 Chronic myeloid leukemia, BCR/ABL-positive, not having achieved remission: Secondary | ICD-10-CM | POA: Diagnosis present

## 2015-01-11 DIAGNOSIS — K219 Gastro-esophageal reflux disease without esophagitis: Secondary | ICD-10-CM | POA: Diagnosis present

## 2015-01-11 DIAGNOSIS — E1122 Type 2 diabetes mellitus with diabetic chronic kidney disease: Secondary | ICD-10-CM | POA: Diagnosis present

## 2015-01-11 DIAGNOSIS — J189 Pneumonia, unspecified organism: Secondary | ICD-10-CM | POA: Diagnosis present

## 2015-01-11 DIAGNOSIS — Z7982 Long term (current) use of aspirin: Secondary | ICD-10-CM

## 2015-01-11 DIAGNOSIS — N183 Chronic kidney disease, stage 3 (moderate): Secondary | ICD-10-CM | POA: Diagnosis present

## 2015-01-11 DIAGNOSIS — G620 Drug-induced polyneuropathy: Secondary | ICD-10-CM | POA: Diagnosis present

## 2015-01-11 DIAGNOSIS — Z794 Long term (current) use of insulin: Secondary | ICD-10-CM

## 2015-01-11 DIAGNOSIS — Z9049 Acquired absence of other specified parts of digestive tract: Secondary | ICD-10-CM | POA: Diagnosis present

## 2015-01-11 DIAGNOSIS — R630 Anorexia: Secondary | ICD-10-CM

## 2015-01-11 DIAGNOSIS — R112 Nausea with vomiting, unspecified: Secondary | ICD-10-CM

## 2015-01-11 DIAGNOSIS — Z6829 Body mass index (BMI) 29.0-29.9, adult: Secondary | ICD-10-CM

## 2015-01-11 DIAGNOSIS — K861 Other chronic pancreatitis: Secondary | ICD-10-CM | POA: Diagnosis present

## 2015-01-11 DIAGNOSIS — E1140 Type 2 diabetes mellitus with diabetic neuropathy, unspecified: Secondary | ICD-10-CM | POA: Diagnosis present

## 2015-01-11 DIAGNOSIS — M7989 Other specified soft tissue disorders: Secondary | ICD-10-CM

## 2015-01-11 DIAGNOSIS — Z85828 Personal history of other malignant neoplasm of skin: Secondary | ICD-10-CM

## 2015-01-11 DIAGNOSIS — R197 Diarrhea, unspecified: Secondary | ICD-10-CM

## 2015-01-11 DIAGNOSIS — R531 Weakness: Secondary | ICD-10-CM | POA: Diagnosis not present

## 2015-01-11 DIAGNOSIS — R109 Unspecified abdominal pain: Secondary | ICD-10-CM | POA: Diagnosis not present

## 2015-01-11 DIAGNOSIS — G4733 Obstructive sleep apnea (adult) (pediatric): Secondary | ICD-10-CM | POA: Diagnosis present

## 2015-01-11 DIAGNOSIS — E8809 Other disorders of plasma-protein metabolism, not elsewhere classified: Secondary | ICD-10-CM

## 2015-01-11 DIAGNOSIS — R1310 Dysphagia, unspecified: Secondary | ICD-10-CM | POA: Diagnosis not present

## 2015-01-11 DIAGNOSIS — E876 Hypokalemia: Secondary | ICD-10-CM | POA: Diagnosis present

## 2015-01-11 DIAGNOSIS — E780 Pure hypercholesterolemia: Secondary | ICD-10-CM | POA: Diagnosis present

## 2015-01-11 DIAGNOSIS — K1231 Oral mucositis (ulcerative) due to antineoplastic therapy: Secondary | ICD-10-CM | POA: Diagnosis present

## 2015-01-11 DIAGNOSIS — H269 Unspecified cataract: Secondary | ICD-10-CM | POA: Diagnosis present

## 2015-01-11 DIAGNOSIS — I129 Hypertensive chronic kidney disease with stage 1 through stage 4 chronic kidney disease, or unspecified chronic kidney disease: Secondary | ICD-10-CM | POA: Diagnosis present

## 2015-01-11 DIAGNOSIS — D696 Thrombocytopenia, unspecified: Secondary | ICD-10-CM | POA: Diagnosis present

## 2015-01-11 DIAGNOSIS — D630 Anemia in neoplastic disease: Secondary | ICD-10-CM | POA: Diagnosis present

## 2015-01-11 DIAGNOSIS — Z95828 Presence of other vascular implants and grafts: Secondary | ICD-10-CM

## 2015-01-11 DIAGNOSIS — T451X5A Adverse effect of antineoplastic and immunosuppressive drugs, initial encounter: Secondary | ICD-10-CM | POA: Diagnosis present

## 2015-01-11 LAB — CBC WITH DIFFERENTIAL/PLATELET
BASO%: 0.1 % (ref 0.0–2.0)
BASOS ABS: 0 10*3/uL (ref 0.0–0.1)
EOS ABS: 0 10*3/uL (ref 0.0–0.5)
EOS%: 0.3 % (ref 0.0–7.0)
HCT: 33 % — ABNORMAL LOW (ref 38.4–49.9)
HEMOGLOBIN: 10.7 g/dL — AB (ref 13.0–17.1)
LYMPH#: 0.4 10*3/uL — AB (ref 0.9–3.3)
LYMPH%: 4.5 % — ABNORMAL LOW (ref 14.0–49.0)
MCH: 32.3 pg (ref 27.2–33.4)
MCHC: 32.5 g/dL (ref 32.0–36.0)
MCV: 99.3 fL — ABNORMAL HIGH (ref 79.3–98.0)
MONO#: 0.1 10*3/uL (ref 0.1–0.9)
MONO%: 1.1 % (ref 0.0–14.0)
NEUT#: 7.4 10*3/uL — ABNORMAL HIGH (ref 1.5–6.5)
NEUT%: 94 % — AB (ref 39.0–75.0)
Platelets: 234 10*3/uL (ref 140–400)
RBC: 3.32 10*6/uL — ABNORMAL LOW (ref 4.20–5.82)
RDW: 20 % — AB (ref 11.0–14.6)
WBC: 7.8 10*3/uL (ref 4.0–10.3)

## 2015-01-11 LAB — COMPREHENSIVE METABOLIC PANEL (CC13)
ALT: 18 U/L (ref 0–55)
ANION GAP: 12 meq/L — AB (ref 3–11)
AST: 37 U/L — ABNORMAL HIGH (ref 5–34)
Albumin: 2.8 g/dL — ABNORMAL LOW (ref 3.5–5.0)
Alkaline Phosphatase: 290 U/L — ABNORMAL HIGH (ref 40–150)
BUN: 12.1 mg/dL (ref 7.0–26.0)
CALCIUM: 9.9 mg/dL (ref 8.4–10.4)
CO2: 24 meq/L (ref 22–29)
CREATININE: 0.8 mg/dL (ref 0.7–1.3)
Chloride: 105 mEq/L (ref 98–109)
EGFR: 87 mL/min/{1.73_m2} — ABNORMAL LOW (ref >90)
GLUCOSE: 151 mg/dL — AB (ref 70–140)
Potassium: 4 mEq/L (ref 3.5–5.1)
SODIUM: 141 meq/L (ref 136–145)
Total Bilirubin: 1.57 mg/dL — ABNORMAL HIGH (ref 0.20–1.20)
Total Protein: 6.2 g/dL — ABNORMAL LOW (ref 6.4–8.3)

## 2015-01-11 MED ORDER — SODIUM CHLORIDE 0.9 % IV SOLN
INTRAVENOUS | Status: AC
  Administered 2015-01-11: 15:00:00 via INTRAVENOUS

## 2015-01-11 MED ORDER — ONDANSETRON 8 MG/50ML IVPB (CHCC)
8.0000 mg | Freq: Once | INTRAVENOUS | Status: AC
  Administered 2015-01-11: 8 mg via INTRAVENOUS

## 2015-01-11 MED ORDER — CEPHALEXIN 500 MG PO CAPS: 500.0000 mg | ORAL_CAPSULE | Freq: Four times a day (QID) | ORAL | Status: DC

## 2015-01-11 MED ORDER — SODIUM CHLORIDE 0.9 % IJ SOLN
10.0000 mL | INTRAMUSCULAR | Status: DC | PRN
  Administered 2015-01-11: 10 mL via INTRAVENOUS
  Filled 2015-01-11: qty 10

## 2015-01-11 MED ORDER — ONDANSETRON 8 MG/NS 50 ML IVPB
INTRAVENOUS | Status: AC
  Filled 2015-01-11: qty 8

## 2015-01-11 MED ORDER — HEPARIN SOD (PORK) LOCK FLUSH 100 UNIT/ML IV SOLN
500.0000 [IU] | Freq: Once | INTRAVENOUS | Status: AC
  Administered 2015-01-11: 500 [IU] via INTRAVENOUS
  Filled 2015-01-11: qty 5

## 2015-01-11 NOTE — Telephone Encounter (Signed)
added appt per pof....added ivf per charge nurse

## 2015-01-11 NOTE — Progress Notes (Signed)
Right Lower Extremity Venous Duplex Completed. No evidence for DVT or SVT. °Brianna L Mazza,RVT °

## 2015-01-12 ENCOUNTER — Encounter: Payer: Self-pay | Admitting: Nurse Practitioner

## 2015-01-12 ENCOUNTER — Encounter (HOSPITAL_COMMUNITY): Payer: Self-pay | Admitting: Emergency Medicine

## 2015-01-12 ENCOUNTER — Inpatient Hospital Stay (HOSPITAL_COMMUNITY)
Admission: EM | Admit: 2015-01-12 | Discharge: 2015-01-16 | DRG: 435 | Disposition: A | Discharge status: Home / Self Care | Payer: Medicare Other | Attending: Internal Medicine | Admitting: Internal Medicine

## 2015-01-12 ENCOUNTER — Other Ambulatory Visit: Payer: Self-pay | Admitting: Nurse Practitioner

## 2015-01-12 ENCOUNTER — Emergency Department (HOSPITAL_COMMUNITY): Payer: Medicare Other

## 2015-01-12 DIAGNOSIS — E1122 Type 2 diabetes mellitus with diabetic chronic kidney disease: Secondary | ICD-10-CM | POA: Diagnosis present

## 2015-01-12 DIAGNOSIS — R630 Anorexia: Secondary | ICD-10-CM | POA: Insufficient documentation

## 2015-01-12 DIAGNOSIS — L0390 Cellulitis, unspecified: Secondary | ICD-10-CM | POA: Diagnosis present

## 2015-01-12 DIAGNOSIS — E1140 Type 2 diabetes mellitus with diabetic neuropathy, unspecified: Secondary | ICD-10-CM | POA: Diagnosis present

## 2015-01-12 DIAGNOSIS — R112 Nausea with vomiting, unspecified: Secondary | ICD-10-CM | POA: Diagnosis present

## 2015-01-12 DIAGNOSIS — R109 Unspecified abdominal pain: Secondary | ICD-10-CM | POA: Diagnosis present

## 2015-01-12 DIAGNOSIS — R1084 Generalized abdominal pain: Secondary | ICD-10-CM

## 2015-01-12 DIAGNOSIS — R531 Weakness: Secondary | ICD-10-CM

## 2015-01-12 DIAGNOSIS — N183 Chronic kidney disease, stage 3 unspecified: Secondary | ICD-10-CM | POA: Diagnosis present

## 2015-01-12 DIAGNOSIS — K1231 Oral mucositis (ulcerative) due to antineoplastic therapy: Secondary | ICD-10-CM | POA: Diagnosis present

## 2015-01-12 DIAGNOSIS — C250 Malignant neoplasm of head of pancreas: Secondary | ICD-10-CM | POA: Diagnosis present

## 2015-01-12 DIAGNOSIS — J189 Pneumonia, unspecified organism: Secondary | ICD-10-CM | POA: Diagnosis present

## 2015-01-12 DIAGNOSIS — R197 Diarrhea, unspecified: Secondary | ICD-10-CM

## 2015-01-12 DIAGNOSIS — E8809 Other disorders of plasma-protein metabolism, not elsewhere classified: Secondary | ICD-10-CM | POA: Insufficient documentation

## 2015-01-12 DIAGNOSIS — I10 Essential (primary) hypertension: Secondary | ICD-10-CM | POA: Diagnosis present

## 2015-01-12 DIAGNOSIS — R1310 Dysphagia, unspecified: Secondary | ICD-10-CM | POA: Insufficient documentation

## 2015-01-12 LAB — COMPREHENSIVE METABOLIC PANEL
ALK PHOS: 319 U/L — AB (ref 39–117)
ALT: 22 U/L (ref 0–53)
ANION GAP: 10 (ref 5–15)
AST: 30 U/L (ref 0–37)
Albumin: 2.8 g/dL — ABNORMAL LOW (ref 3.5–5.2)
BUN: 14 mg/dL (ref 6–23)
CO2: 27 mmol/L (ref 19–32)
CREATININE: 0.84 mg/dL (ref 0.50–1.35)
Calcium: 9 mg/dL (ref 8.4–10.5)
Chloride: 98 mmol/L (ref 96–112)
GFR calc Af Amer: >90 mL/min (ref >90)
GFR calc non Af Amer: 85 mL/min — ABNORMAL LOW (ref >90)
Glucose, Bld: 140 mg/dL — ABNORMAL HIGH (ref 70–99)
POTASSIUM: 3.5 mmol/L (ref 3.5–5.1)
Sodium: 135 mmol/L (ref 135–145)
TOTAL PROTEIN: 6.1 g/dL (ref 6.0–8.3)
Total Bilirubin: 1.5 mg/dL — ABNORMAL HIGH (ref 0.3–1.2)

## 2015-01-12 LAB — I-STAT CG4 LACTIC ACID, ED: Lactic Acid, Venous: 0.75 mmol/L (ref 0.5–2.0)

## 2015-01-12 MED ORDER — VANCOMYCIN HCL 10 G IV SOLR
2000.0000 mg | Freq: Once | INTRAVENOUS | Status: AC
  Administered 2015-01-13: 2000 mg via INTRAVENOUS
  Filled 2015-01-12: qty 2000

## 2015-01-12 MED ORDER — VANCOMYCIN HCL IN DEXTROSE 1-5 GM/200ML-% IV SOLN: 1000.0000 mg | Freq: Once | INTRAVENOUS | Status: DC

## 2015-01-12 MED ORDER — SODIUM CHLORIDE 0.9 % IV BOLUS (SEPSIS)
1000.0000 mL | INTRAVENOUS | Status: AC
  Administered 2015-01-12 – 2015-01-13 (×3): 1000 mL via INTRAVENOUS

## 2015-01-12 MED ORDER — SODIUM CHLORIDE 0.9 % IJ SOLN
INTRAMUSCULAR | Status: AC
  Administered 2015-01-12: 10 mL
  Filled 2015-01-12: qty 10

## 2015-01-12 MED ORDER — SODIUM CHLORIDE 0.9 % IV BOLUS (SEPSIS)
500.0000 mL | INTRAVENOUS | Status: DC
Start: 2015-01-13 — End: 2015-01-13

## 2015-01-12 MED ORDER — PIPERACILLIN-TAZOBACTAM 3.375 G IVPB 30 MIN
3.3750 g | Freq: Once | INTRAVENOUS | Status: AC
  Administered 2015-01-13: 3.375 g via INTRAVENOUS
  Filled 2015-01-12: qty 50

## 2015-01-12 NOTE — Assessment & Plan Note (Signed)
Patient is complaining of some chronic nausea/vomiting; and subsequent dehydration the past few days.  Most likely, this is secondary to chemotherapy.  Patient states that he already has anti-emetics at home to use on an as-needed basis.  Patient was given Zofran 8 mg IV while receiving IV fluid rehydration today.

## 2015-01-12 NOTE — Assessment & Plan Note (Addendum)
Patient received cycle 2, day 1 of his Abraxane/gemcitabine chemotherapy regimen on 01/07/2015.  He is scheduled to return this coming Monday every 29th 2015 for cycle 2, day 8 of the same regimen.  Patient has plans to return this coming Monday, 01/14/2015 for labs, a follow up visit, and his next cycle of chemotherapy.  Will need to assess right lower extremity cellulitis and dehydration/weakness prior to approval of chemotherapy on 01/14/15.

## 2015-01-12 NOTE — Assessment & Plan Note (Signed)
Bilirubin has increased from 0.55 on 01/07/2015 up to 1.57.  Patient will return of 01/14/2015 for recheck of bilirubin.

## 2015-01-12 NOTE — Assessment & Plan Note (Signed)
Patient reports both nausea/vomiting and diarrhea for the past several days.  He feels fairly dehydrated today.  Patient will receive IV fluid rehydration blood the cancer Center today.  He was also encouraged to push fluids is much as possible.  He may very will need additional IV fluid rehydration when he returns to the cancer Center this coming Monday, 01/14/2015.

## 2015-01-12 NOTE — Assessment & Plan Note (Signed)
Patient is complaining of continued, progressive weakness since his last chemotherapy on 01/07/2015.  Most likely, this is secondary to vomiting/diarrhea, dehydration, and chemotherapy side effect.  Hopefully, receiving IV fluid rehydration we'll consider today will improve his weakness complaints.

## 2015-01-12 NOTE — ED Notes (Addendum)
Patient with c/o emesis and decreased appetite which has been an ongoing issue for patient and states that decreased intake has occurred in the past and is r/t an ulcer Patient seen yesterday for c/o same complaints and given fluids Patient also had PVL studies completed yesterday for right leg pain, swelling and redness--Results negative Patient was placed on Keflex for cellulitis to the right leg Patient arrives alert and oriented x 4 No active emesis upon assessment

## 2015-01-12 NOTE — Assessment & Plan Note (Signed)
Right lower leg and ankle with erythema, edema, mild tenderness, and some redness radiating both up and down leg.  All pulses are palpable in all extremities are warm.  Patient was range of motion with all extremities.  Doppler ultrasound obtained today was negative for DVT.  Cellulitic area was marked with skin marker today.  Prescribed Keflex for treatment of cellulitis.  Advised patient and his wife to call/return go directly to the emergency for any worsening symptoms whatsoever.  Also, patient has plans to return to the [facility] this coming Monday 01/14/2015; and will recheck right lower extremity that time.

## 2015-01-12 NOTE — ED Notes (Signed)
PA at bedside.

## 2015-01-12 NOTE — ED Notes (Signed)
Lab called and made aware of need for flu swab

## 2015-01-12 NOTE — ED Notes (Signed)
Marcello Moores, RN at bedside to access port

## 2015-01-12 NOTE — Assessment & Plan Note (Signed)
Alkaline phosphatase elevated from 220 of 290 today. Will continue to monitor closely.

## 2015-01-12 NOTE — Assessment & Plan Note (Signed)
Patient is complaining of some mild dysphagia when eating specific foods recently.  He states that he had the same symptoms when he was diagnosed with an ulcer in the past.  On exam-patient with no obvious difficulty managing secretions.  Patient was able to drink fluids with no difficulty as well. Will continue to monitor closely.

## 2015-01-12 NOTE — Assessment & Plan Note (Signed)
Albumin has decreased from 3.0 down to 2.8.  Patient was encouraged to push protein is much as possible.

## 2015-01-12 NOTE — ED Notes (Signed)
Portable CXR at bedside.

## 2015-01-12 NOTE — ED Notes (Addendum)
Pt arrived to the ED with a complaint of emesis.  Pt's wife states that he has had a fever that has been climbing with the high of 100.8.  Pt was seen at the [facility] yesterday and given fluids.  Pt was told he would be evaluated Monday.  Pt states he has had no appetite.  Pt was placed on antibiotics for cellulitis yesterday as well.  Pt has area on right leg that is marked.  Pt feet appear swollen and red.

## 2015-01-12 NOTE — Progress Notes (Signed)
SYMPTOM MANAGEMENT CLINIC   HPI: Evan [name] 74 y.o. male diagnosed with pancreatic cancer.  Currently undergoing Abraxane/gemcitabine chemotherapy regimen.  Patient the cancer Center today requesting urgent care visit.  Patient received cycle 2, day 1 of his Abraxane/gemcitabine chemotherapy regimen on 01/07/2015.  He called the cancer Center today requested an urgent care visit.  He is complaining of nausea/vomiting, diarrhea, dehydration, and progressive weakness this week.  He is also complaining of some dysphagia; stating that it is difficult to swallow some specific foods.  He states that he had the same dysphagia symptoms in the past when he was diagnosed with ulcer. He reports having little appetite and poor oral intake recently.  He denies any recent fevers or chills.  He is also noticed some mild erythema to his right lower extremity since this past Tuesday, 01/08/2005.  HPI  ROS  Past Medical History  Diagnosis Date  . Chronic back pain greater than 3 months duration   . Bulging discs   . Diabetes mellitus     type II; neuropathy;   . Hypertension   . High cholesterol   . Leukocytosis   . GERD (gastroesophageal reflux disease)   . Pancreatitis 2004  . Anxiety   . DDD (degenerative disc disease) 2005    lumbar spine  . History of smoking 08/01/2012  . Weight loss 08/01/2012  . Cough 08/01/2012  . Left leg pain 08/05/2012  . Chronic pancreatitis   . OSA (obstructive sleep apnea) 2010    does not use CPAP  . Nausea alone 06/13/2014  . Allergy   . Mucositis (ulcerative) due to antineoplastic therapy 07/02/2014  . Constipated 07/16/2014  . Gastric ulcer 08/23/2014  . Hx of radiation therapy 06/25/14-08/06/14    pancreas head, 50.4Gy  . 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 Pancreatic cancer 06/08/2014  . Chest pain 11/05/2014    Past Surgical History  Procedure Laterality Date  . Cholecystectomy      laparoscopic  .  Hernia repair      umbilical  . Skin cancer removed  2012    right ear, basal cell carcinoma.   Marland Kitchen Spinal stretching    . Colonoscopy  2012    [facility]  . Eus Left 06/07/2014    Procedure: UPPER ENDOSCOPIC ULTRASOUND (EUS) LINEAR;  Surgeon: Arta Silence, MD;  Location: [facility] ENDOSCOPY;  Service: Endoscopy;  Laterality: Left;  . Ercp N/A 06/07/2014    Procedure: ENDOSCOPIC RETROGRADE CHOLANGIOPANCREATOGRAPHY (ERCP);  Surgeon: Arta Silence, MD;  Location: [facility];  Service: Endoscopy;  Laterality: N/A;  . Eye surgery      bilateral cataracts with lens implant  . Cataract extraction Bilateral   . Port acath placement Left 06/14/14  . Portacath placement Left 06/14/2014    Procedure: INSERTION PORT-A-CATH;  Surgeon: Stark Klein, MD;  Location: [facility] ORS;  Service: General;  Laterality: Left;  . Esophagogastroduodenoscopy N/A 08/15/2014    Procedure: ESOPHAGOGASTRODUODENOSCOPY (EGD);  Surgeon: Lear Ng, MD;  Location: [facility];  Service: Endoscopy;  Laterality: N/A;    has Left leg pain; Hypertension; Diabetes mellitus with neuropathy; Elevated liver enzymes; Pancreatic mass; Hyperbilirubinemia; Vitamin B12 deficiency neuropathy; CKD stage 3 due to type 2 diabetes mellitus; Obesity (BMI 30-39.9); Malignant neoplasm of head of pancreas; Nausea alone; Mucositis (ulcerative) due to antineoplastic therapy; Leukopenia due to antineoplastic chemotherapy; Thrombocytopenia due to drugs; Anemia in neoplastic disease; Rash; Constipated; Right upper quadrant abdominal pain; Dehydration; Generalized weakness; Failure to thrive  in adult; Diabetes mellitus with hypoglycemia; Protein-calorie malnutrition, severe; Gastric ulcer; Lung nodule; Chest pain; Atypical chest pain; Neuropathy due to chemotherapeutic drug; Dysuria; Neutropenic fever; Sepsis; HCAP (healthcare-associated pneumonia); Dysphagia; Nausea & vomiting; Diarrhea; Anorexia; Cellulitis; Hyperphosphatemia; and Hypoalbuminemia on his problem list.     is allergic to ace inhibitors and ciprofloxacin.    Medication List       This list is accurate as of: 01/11/15 11:59 PM.  Always use your most recent med list.               acetaminophen 500 MG tablet  Commonly known as:  TYLENOL  Take 1,000 mg by mouth every 6 (six) hours as needed for fever.     aspirin EC 81 MG tablet  Take 81 mg by mouth daily.     cefdinir 300 MG capsule  Commonly known as:  OMNICEF  Take 1 capsule (300 mg total) by mouth 2 (two) times daily.     cephALEXin 500 MG capsule  Commonly known as:  KEFLEX  Take 1 capsule (500 mg total) by mouth 4 (four) times daily.     cholecalciferol 1000 UNITS tablet  Commonly known as:  VITAMIN D  Take 1,000 Units by mouth daily.     dasatinib 50 MG tablet  Commonly known as:  SPRYCEL  Take 50 mg by mouth daily.     diphenhydrAMINE 25 mg capsule  Commonly known as:  BENADRYL  Take 25-50 mg by mouth every 6 (six) hours as needed for itching (itching).     hydrocortisone cream 1 %  Apply 1 application topically daily as needed for itching (itching).     HYDROmorphone 4 MG tablet  Commonly known as:  DILAUDID  Take 4 mg by mouth every 4 (four) hours as needed for severe pain (pain).     insulin glargine 100 UNIT/ML injection  Commonly known as:  LANTUS  Inject 26 Units into the skin at bedtime.     lactulose 10 GM/15ML solution  Commonly known as:  CHRONULAC  Take 10 g by mouth 2 (two) times daily as needed for moderate constipation.     losartan 50 MG tablet  Commonly known as:  COZAAR  Take 50 mg by mouth daily.     metFORMIN 1000 MG tablet  Commonly known as:  GLUCOPHAGE  Take 1,000 mg by mouth 2 (two) times daily with a meal.     pantoprazole 40 MG tablet  Commonly known as:  PROTONIX  Take 1 tablet (40 mg total) by mouth daily.     PRESCRIPTION MEDICATION  Chemo at [facility] 01/07/15.     promethazine 25 MG tablet  Commonly known as:  PHENERGAN  Take 25 mg by mouth every 6 (six) hours as  needed for nausea or vomiting (nausea).     sennosides-docusate sodium 8.6-50 MG tablet  Commonly known as:  SENOKOT-S  Take 2 tablets by mouth at bedtime.     simvastatin 20 MG tablet  Commonly known as:  ZOCOR  Take 20 mg by mouth daily.     SUPER B COMPLEX/VITAMIN C PO  Take 1 tablet by mouth daily.     vitamin B-12 1000 MCG tablet  Commonly known as:  CYANOCOBALAMIN  Take 1,000 mcg by mouth daily.         PHYSICAL EXAMINATION  Blood pressure 144/77, pulse 101, temperature 98.9 F (37.2 C), temperature source Oral, resp. rate 20, weight 224 lb 11.2 oz (101.923 kg).  Physical Exam  Constitutional: He is oriented to person, place, and time. Vital signs are normal. He appears dehydrated. He appears unhealthy.  Patient appears weak, fragile, and chronically ill.  HENT:  Head: Normocephalic and atraumatic.  Mouth/Throat: Oropharynx is clear and moist.  Eyes: Conjunctivae and EOM are normal. Pupils are equal, round, and reactive to light. Right eye exhibits no discharge. Left eye exhibits no discharge. No scleral icterus.  Neck: Normal range of motion. Neck supple. No JVD present. No tracheal deviation present. No thyromegaly present.  Cardiovascular: Normal rate, regular rhythm, normal heart sounds and intact distal pulses.   Pulmonary/Chest: Effort normal and breath sounds normal. No respiratory distress. He has no wheezes. He has no rales. He exhibits no tenderness.  Abdominal: Soft. Bowel sounds are normal. He exhibits no distension and no mass. There is no tenderness. There is no rebound and no guarding.  Musculoskeletal: Normal range of motion. He exhibits edema and tenderness.  Lymphadenopathy:    He has no cervical adenopathy.  Neurological: He is alert and oriented to person, place, and time. Gait normal.  Skin: Skin is warm and dry. No rash noted. There is erythema. There is pallor.  Moderate edema to the entire right lower extremity below the knee.  Also noted to have  erythema, warmth, mild tenderness, and some radiation of erythema both of them down the leg.  All pulses are palpable is warm.  Patient continues with full range of motion.  Psychiatric: Affect normal.    LABORATORY DATA:. Appointment on 01/11/2015  Component Date Value Ref Range Status  . WBC 01/11/2015 7.8  4.0 - 10.3 10e3/uL Final  . NEUT# 01/11/2015 7.4* 1.5 - 6.5 10e3/uL Final  . HGB 01/11/2015 10.7* 13.0 - 17.1 g/dL Final  . HCT 01/11/2015 33.0* 38.4 - 49.9 % Final  . Platelets 01/11/2015 234  140 - 400 10e3/uL Final  . MCV 01/11/2015 99.3* 79.3 - 98.0 fL Final  . MCH 01/11/2015 32.3  27.2 - 33.4 pg Final  . MCHC 01/11/2015 32.5  32.0 - 36.0 g/dL Final  . RBC 01/11/2015 3.32* 4.20 - 5.82 10e6/uL Final  . RDW 01/11/2015 20.0* 11.0 - 14.6 % Final  . lymph# 01/11/2015 0.4* 0.9 - 3.3 10e3/uL Final  . MONO# 01/11/2015 0.1  0.1 - 0.9 10e3/uL Final  . Eosinophils Absolute 01/11/2015 0.0  0.0 - 0.5 10e3/uL Final  . Basophils Absolute 01/11/2015 0.0  0.0 - 0.1 10e3/uL Final  . NEUT% 01/11/2015 94.0* 39.0 - 75.0 % Final  . LYMPH% 01/11/2015 4.5* 14.0 - 49.0 % Final  . MONO% 01/11/2015 1.1  0.0 - 14.0 % Final  . EOS% 01/11/2015 0.3  0.0 - 7.0 % Final  . BASO% 01/11/2015 0.1  0.0 - 2.0 % Final  . Sodium 01/11/2015 141  136 - 145 mEq/L Final  . Potassium 01/11/2015 4.0  3.5 - 5.1 mEq/L Final  . Chloride 01/11/2015 105  98 - 109 mEq/L Final  . CO2 01/11/2015 24  22 - 29 mEq/L Final  . Glucose 01/11/2015 151* 70 - 140 mg/dl Final  . BUN 01/11/2015 12.1  7.0 - 26.0 mg/dL Final  . Creatinine 01/11/2015 0.8  0.7 - 1.3 mg/dL Final  . Total Bilirubin 01/11/2015 1.57* 0.20 - 1.20 mg/dL Final  . Alkaline Phosphatase 01/11/2015 290* 40 - 150 U/L Final  . AST 01/11/2015 37* 5 - 34 U/L Final  . ALT 01/11/2015 18  0 - 55 U/L Final  . Total Protein 01/11/2015 6.2* 6.4 - 8.3 g/dL Final  .  Albumin 01/11/2015 2.8* 3.5 - 5.0 g/dL Final  . Calcium 01/11/2015 9.9  8.4 - 10.4 mg/dL Final  . Anion Gap  01/11/2015 12* 3 - 11 mEq/L Final  . EGFR 01/11/2015 87* >90 ml/min/1.73 m2 Final   eGFR is calculated using the CKD-EPI Creatinine Equation (2009)  Admission on 01/08/2015, Discharged on 01/09/2015  Component Date Value Ref Range Status  . Sodium 01/08/2015 143  135 - 145 mmol/L Final  . Potassium 01/08/2015 3.7  3.5 - 5.1 mmol/L Final  . Chloride 01/08/2015 108  96 - 112 mmol/L Final  . CO2 01/08/2015 27  19 - 32 mmol/L Final  . Glucose, Bld 01/08/2015 224* 70 - 99 mg/dL Final  . BUN 01/08/2015 21  6 - 23 mg/dL Final  . Creatinine, Ser 01/08/2015 1.42* 0.50 - 1.35 mg/dL Final  . Calcium 01/08/2015 9.5  8.4 - 10.5 mg/dL Final  . GFR calc non Af Amer 01/08/2015 48* >90 mL/min Final  . GFR calc Af Amer 01/08/2015 55* >90 mL/min Final   Comment: (NOTE) The eGFR has been calculated using the CKD EPI equation. This calculation has not been validated in all clinical situations. eGFR's persistently <90 mL/min signify possible Chronic Kidney Disease.   . Anion gap 01/08/2015 8  5 - 15 Final  . WBC 01/08/2015 8.9  4.0 - 10.5 K/uL Final  . RBC 01/08/2015 3.21* 4.22 - 5.81 MIL/uL Final  . Hemoglobin 01/08/2015 10.6* 13.0 - 17.0 g/dL Final  . HCT 01/08/2015 32.6* 39.0 - 52.0 % Final  . MCV 01/08/2015 101.6* 78.0 - 100.0 fL Final  . MCH 01/08/2015 33.0  26.0 - 34.0 pg Final  . MCHC 01/08/2015 32.5  30.0 - 36.0 g/dL Final  . RDW 01/08/2015 20.1* 11.5 - 15.5 % Final  . Platelets 01/08/2015 187  150 - 400 K/uL Final  . Neutrophils Relative % 01/08/2015 90* 43 - 77 % Final  . Neutro Abs 01/08/2015 8.0* 1.7 - 7.7 K/uL Final  . Lymphocytes Relative 01/08/2015 6* 12 - 46 % Final  . Lymphs Abs 01/08/2015 0.5* 0.7 - 4.0 K/uL Final  . Monocytes Relative 01/08/2015 4  3 - 12 % Final  . Monocytes Absolute 01/08/2015 0.4  0.1 - 1.0 K/uL Final  . Eosinophils Relative 01/08/2015 0  0 - 5 % Final  . Eosinophils Absolute 01/08/2015 0.0  0.0 - 0.7 K/uL Final  . Basophils Relative 01/08/2015 0  0 - 1 % Final   . Basophils Absolute 01/08/2015 0.0  0.0 - 0.1 K/uL Final  . Lactic Acid, Venous 01/08/2015 1.84  0.5 - 2.0 mmol/L Final  . Color, Urine 01/08/2015 YELLOW  YELLOW Final  . APPearance 01/08/2015 CLEAR  CLEAR Final  . Specific Gravity, Urine 01/08/2015 1.011  1.005 - 1.030 Final  . pH 01/08/2015 5.5  5.0 - 8.0 Final  . Glucose, UA 01/08/2015 NEGATIVE  NEGATIVE mg/dL Final  . Hgb urine dipstick 01/08/2015 NEGATIVE  NEGATIVE Final  . Bilirubin Urine 01/08/2015 NEGATIVE  NEGATIVE Final  . Ketones, ur 01/08/2015 NEGATIVE  NEGATIVE mg/dL Final  . Protein, ur 01/08/2015 NEGATIVE  NEGATIVE mg/dL Final  . Urobilinogen, UA 01/08/2015 1.0  0.0 - 1.0 mg/dL Final  . Nitrite 01/08/2015 NEGATIVE  NEGATIVE Final  . Leukocytes, UA 01/08/2015 NEGATIVE  NEGATIVE Final   MICROSCOPIC NOT DONE ON URINES WITH NEGATIVE PROTEIN, BLOOD, LEUKOCYTES, NITRITE, OR GLUCOSE <1000 mg/dL.  Marland Kitchen Specimen Description 01/08/2015 BLOOD LEFT ANTECUBITAL   Final  . Special Requests 01/08/2015 BOTTLES DRAWN AEROBIC AND  ANAEROBIC 5CC   Final  . Culture 01/08/2015    Final                   Value:       BLOOD CULTURE RECEIVED NO GROWTH TO DATE CULTURE WILL BE HELD FOR 5 DAYS BEFORE ISSUING A FINAL NEGATIVE REPORT Performed at [facility]   . Report Status 01/08/2015 PENDING   Incomplete  . Specimen Description 01/08/2015 BLOOD LEFT CHEST   Final  . Special Requests 01/08/2015 BOTTLES DRAWN AEROBIC AND ANAEROBIC 5CC   Final  . Culture 01/08/2015    Final                   Value:       BLOOD CULTURE RECEIVED NO GROWTH TO DATE CULTURE WILL BE HELD FOR 5 DAYS BEFORE ISSUING A FINAL NEGATIVE REPORT Performed at [facility]   . Report Status 01/08/2015 PENDING   Incomplete   Doppler US:   Expand All Collapse All   Right Lower Extremity Venous Duplex Completed. No evidence for DVT or SVT         RADIOGRAPHIC STUDIES: Dg Chest Portable 1 View  01/08/2015   CLINICAL DATA:  Acute severe today, history of pancreas  cancer. Receiving chemotherapy.  EXAM: PORTABLE CHEST - 1 VIEW  COMPARISON:  12/26/2014  FINDINGS: Background emphysema noted. Mild cardiomegaly with vascular and interstitial prominence, slightly worse. Developing edema suspected. No effusion, collapse or consolidation. No pneumothorax. Left subclavian power port catheter tip proximal SVC. Trachea is midline. Atherosclerosis of the aorta.  IMPRESSION: Mild cardiomegaly with mild developing interstitial edema.   Electronically Signed   By: Jerilynn Mages.  Shick M.D.   On: 01/08/2015 21:53    ASSESSMENT/PLAN:    Anorexia Patient complaining of minimal appetite and poor oral intake secondary to dysphagia. He states that he had the same symptoms when he was diagnosed with ulcer previously. On exam- patient appears to be managing all secretions with no  difficulty whatsoever. Will continue to monitor closely.   Cellulitis Right lower leg and ankle with erythema, edema, mild tenderness, and some redness radiating both up and down leg.  All pulses are palpable in all extremities are warm.  Patient was range of motion with all extremities.  Doppler ultrasound obtained today was negative for DVT.  Cellulitic area was marked with skin marker today.  Prescribed Keflex for treatment of cellulitis.  Advised patient and his wife to call/return go directly to the emergency for any worsening symptoms whatsoever.  Also, patient has plans to return to the [facility] this coming Monday 01/14/2015; and will recheck right lower extremity that time.   Dehydration Patient reports both nausea/vomiting and diarrhea for the past several days.  He feels fairly dehydrated today.  Patient will receive IV fluid rehydration blood the cancer Center today.  He was also encouraged to push fluids is much as possible.  He may very will need additional IV fluid rehydration when he returns to the cancer Center this coming Monday, 01/14/2015.   Diarrhea Complaint of diarrhea for the  past few days.  Patient has tried Imodium with only partial effectiveness.  Patient was encouragedto continue with Imodium as pressure.  May also need to prescribe Lomotil if diarrhea continues.  Patient was also encouraged to try a clear liquid diet until able to tolerate a more advanced diet.   Dysphagia Patient is complaining of some mild dysphagia when eating specific foods recently.  He states  that he had the same symptoms when he was diagnosed with an ulcer in the past.  On exam-patient with no obvious difficulty managing secretions.  Patient was able to drink fluids with no difficulty as well. Will continue to monitor closely.   Generalized weakness Patient is complaining of continued, progressive weakness since his last chemotherapy on 01/07/2015.  Most likely, this is secondary to vomiting/diarrhea, dehydration, and chemotherapy side effect.  Hopefully, receiving IV fluid rehydration we'll consider today will improve his weakness complaints.   Hyperbilirubinemia Bilirubin has increased from 0.55 on 01/07/2015 up to 1.57.  Patient will return of 01/14/2015 for recheck of bilirubin.   Hyperphosphatemia Alkaline phosphatase elevated from 220 of 290 today. Will continue to monitor closely.   Hypoalbuminemia Albumin has decreased from 3.0 down to 2.8.  Patient was encouraged to push protein is much as possible.   Malignant neoplasm of head of pancreas Patient received cycle 2, day 1 of his Abraxane/gemcitabine chemotherapy regimen on 01/07/2015.  He is scheduled to return this coming Monday every 29th 2015 for cycle 2, day 8 of the same regimen.  Patient has plans to return this coming Monday, 01/14/2015 for labs, a follow up visit, and his next cycle of chemotherapy.  Will need to assess right lower extremity cellulitis and dehydration/weakness prior to approval of chemotherapy on 01/14/15.   Nausea & vomiting Patient is complaining of some chronic nausea/vomiting; and  subsequent dehydration the past few days.  Most likely, this is secondary to chemotherapy.  Patient states that he already has anti-emetics at home to use on an as-needed basis.  Patient was given Zofran 8 mg IV while receiving IV fluid rehydration today.   Patient stated understanding of all instructions; and was in agreement with this plan of care. The patient knows to call the clinic with any problems, questions or concerns.   Review/collaboration with Dr. Alvy Bimler regarding all aspects of patient's visit today.   Total time spent with patient was 40 minutes;  with greater than 75 percent of that time spent in face to face counseling regarding patient's symptoms,  and coordination of care and follow up.  Disclaimer: This note was dictated with voice recognition software. Similar sounding words can inadvertently be transcribed and may not be corrected upon review.   Drue Second, NP 01/12/2015

## 2015-01-12 NOTE — Assessment & Plan Note (Signed)
Complaint of diarrhea for the past few days.  Patient has tried Imodium with only partial effectiveness.  Patient was encouragedto continue with Imodium as pressure.  May also need to prescribe Lomotil if diarrhea continues.  Patient was also encouraged to try a clear liquid diet until able to tolerate a more advanced diet.

## 2015-01-12 NOTE — ED Provider Notes (Signed)
CSN: [id number]     Arrival date & time 01/12/15  2024 History   First MD Initiated Contact with Patient 01/12/15 2144     Chief Complaint  Patient presents with  . Emesis     (Consider location/radiation/quality/duration/timing/severity/associated sxs/prior Treatment) The history is provided by the patient and medical records. No language interpreter was used.     Evan [name] is a 74 y.o. male  with a hx of chronic back pain, type 2 diabetes, hypertension, leukocytosis, GERD, pancreatitis, pancreatic cancer stage 4 on chemo, chronic myelocytic leukemia, peptic ulcer disease, ulcerative mucositis presents to the Emergency Department complaining of intetmittent emesis onset earlier in the week with increased episodes since last night.  Pt's last chemo treatment was Monday (6 days ago).  Pt's wife reports he ate a small meal on Monday at lunch and that was the beginning of the abd discomfort.  He ate several small meals on Tuesday.  He ate several bites on Wednesday.  Pt's last meal was 3 crackers today.  Pt's wife reports he had a large gastric ulcer in September and had symptoms very similar to those today.  He was seeing Dr. Michail Sermon (GI) for this.  He has not had radiation since that time.  Pt is complaining of emesis, a full feeling in his throat and increased fatigue.  Wife reports lethargy.  He saw the cancer center yesterday, given a fluid bolus and dx with cellulitis of the right leg.  He was d/c home with Keflex and wife reports significant improvement.  Pt's wife reports he was seen here in the ED on Tuesday night and given fluids for dehydration.  Pt was dx with pneumonia 2 weeks ago and kept in the hospital several days for this.  Pt denies abdominal pain, hematemesis, melena and hematochezia.  Associated symptoms include diarrhea throughout the week.  Pt reports taking Zofran at home without relief.  He had a low grade fever to 100.8 tonight.  Pt's last tylenol was at 7pm.  Pt denies  headache, neck pain, chest pain, SOB.  Pt does have significant fatigue and has been sleeping all day.    Oncology: Alvy Bimler  Past Medical History  Diagnosis Date  . Chronic back pain greater than 3 months duration   . Bulging discs   . Diabetes mellitus     type II; neuropathy;   . Hypertension   . High cholesterol   . Leukocytosis   . GERD (gastroesophageal reflux disease)   . Pancreatitis 2004  . Anxiety   . DDD (degenerative disc disease) 2005    lumbar spine  . History of smoking 08/01/2012  . Weight loss 08/01/2012  . Cough 08/01/2012  . Left leg pain 08/05/2012  . Chronic pancreatitis   . OSA (obstructive sleep apnea) 2010    does not use CPAP  . Nausea alone 06/13/2014  . Allergy   . Mucositis (ulcerative) due to antineoplastic therapy 07/02/2014  . Constipated 07/16/2014  . Gastric ulcer 08/23/2014  . Hx of radiation therapy 06/25/14-08/06/14    pancreas head, 50.4Gy  . 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 Pancreatic cancer 06/08/2014  . Chest pain 11/05/2014   Past Surgical History  Procedure Laterality Date  . Cholecystectomy      laparoscopic  . Hernia repair      umbilical  . Skin cancer removed  2012    right ear, basal cell carcinoma.   Marland Kitchen Spinal stretching    .  Colonoscopy  2012    [facility]  . Eus Left 06/07/2014    Procedure: UPPER ENDOSCOPIC ULTRASOUND (EUS) LINEAR;  Surgeon: Arta Silence, MD;  Location: [facility];  Service: Endoscopy;  Laterality: Left;  . Ercp N/A 06/07/2014    Procedure: ENDOSCOPIC RETROGRADE CHOLANGIOPANCREATOGRAPHY (ERCP);  Surgeon: Arta Silence, MD;  Location: [facility];  Service: Endoscopy;  Laterality: N/A;  . Eye surgery      bilateral cataracts with lens implant  . Cataract extraction Bilateral   . Port acath placement Left 06/14/14  . Portacath placement Left 06/14/2014    Procedure: INSERTION PORT-A-CATH;  Surgeon: Stark Klein, MD;  Location: [facility] ORS;  Service: General;  Laterality:  Left;  . Esophagogastroduodenoscopy N/A 08/15/2014    Procedure: ESOPHAGOGASTRODUODENOSCOPY (EGD);  Surgeon: Lear Ng, MD;  Location: [facility];  Service: Endoscopy;  Laterality: N/A;   Family History  Problem Relation Age of Onset  . Heart attack Mother   . Cancer Mother 85    lung  . Heart attack Father   . Stroke Father    History  Substance Use Topics  . Smoking status: Former Smoker -- 1.50 packs/day for 40 years    Quit date: 01/24/2004  . Smokeless tobacco: Never Used  . Alcohol Use: No    Review of Systems  Constitutional: Positive for fever and fatigue. Negative for diaphoresis, appetite change and unexpected weight change.  HENT: Negative for mouth sores.   Eyes: Negative for visual disturbance.  Respiratory: Negative for cough, chest tightness, shortness of breath and wheezing.   Cardiovascular: Negative for chest pain.  Gastrointestinal: Positive for nausea, vomiting and diarrhea. Negative for abdominal pain and constipation.  Endocrine: Negative for polydipsia, polyphagia and polyuria.  Genitourinary: Negative for dysuria, urgency, frequency and hematuria.  Musculoskeletal: Negative for back pain and neck stiffness.  Skin: Negative for rash.  Allergic/Immunologic: Negative for immunocompromised state.  Neurological: Positive for weakness. Negative for syncope, light-headedness and headaches.  Hematological: Does not bruise/bleed easily.  Psychiatric/Behavioral: Negative for sleep disturbance. The patient is not nervous/anxious.       Allergies  Ace inhibitors and Ciprofloxacin  Home Medications   Prior to Admission medications   Medication Sig Start Date End Date Taking? Authorizing Provider  acetaminophen (TYLENOL) 500 MG tablet Take 1,000 mg by mouth every 6 (six) hours as needed for fever.   Yes Historical Provider, MD  aspirin EC 81 MG tablet Take 81 mg by mouth daily.   Yes Historical Provider, MD  B Complex-C (SUPER B COMPLEX/VITAMIN C  PO) Take 1 tablet by mouth daily.    Yes Historical Provider, MD  cephALEXin (KEFLEX) 500 MG capsule Take 1 capsule (500 mg total) by mouth 4 (four) times daily. 01/11/15  Yes Drue Second, NP  cholecalciferol (VITAMIN D) 1000 UNITS tablet Take 1,000 Units by mouth daily.   Yes Historical Provider, MD  dasatinib (SPRYCEL) 50 MG tablet Take 50 mg by mouth daily.  01/15/14  Yes [name], MD  insulin glargine (LANTUS) 100 UNIT/ML injection Inject 26 Units into the skin at bedtime.  08/09/14  Yes Modena Jansky, MD  losartan (COZAAR) 50 MG tablet Take 50 mg by mouth daily. 12/06/14  Yes Historical Provider, MD  metFORMIN (GLUCOPHAGE) 1000 MG tablet Take 1,000 mg by mouth 2 (two) times daily with a meal.   Yes Historical Provider, MD  pantoprazole (PROTONIX) 40 MG tablet Take 1 tablet (40 mg total) by mouth daily. 08/23/14  Yes [name], MD  PRESCRIPTION MEDICATION Chemo  at [facility] 01/07/15.   Yes Historical Provider, MD  promethazine (PHENERGAN) 25 MG tablet Take 25 mg by mouth every 6 (six) hours as needed for nausea or vomiting (nausea).   Yes Historical Provider, MD  sennosides-docusate sodium (SENOKOT-S) 8.6-50 MG tablet Take 2 tablets by mouth at bedtime.    Yes Historical Provider, MD  simvastatin (ZOCOR) 20 MG tablet Take 20 mg by mouth daily.    Yes Historical Provider, MD  vitamin B-12 (CYANOCOBALAMIN) 1000 MCG tablet Take 1,000 mcg by mouth daily.   Yes Historical Provider, MD  cefdinir (OMNICEF) 300 MG capsule Take 1 capsule (300 mg total) by mouth 2 (two) times daily. Patient not taking: Reported on 01/12/2015 12/29/14   [name], MD  lactulose [facility]) 10 GM/15ML solution Take 10 g by mouth 2 (two) times daily as needed for moderate constipation.  06/21/14   Historical Provider, MD   BP 148/81 mmHg  Pulse 94  Temp(Src) 99.6 F (37.6 C) (Rectal)  Resp 19  Ht 6\' 2"  (1.88 m)  Wt 230 lb (104.327 kg)  BMI 29.52 kg/m2  SpO2 95% Physical Exam  Constitutional: He appears  well-developed. No distress.  Awake, alert,  Chronically ill-appearing  HENT:  Head: Normocephalic and atraumatic.  Mouth/Throat: Oropharynx is clear and moist. No oropharyngeal exudate.  Eyes: Conjunctivae are normal. No scleral icterus.  Neck: Normal range of motion. Neck supple.  Cardiovascular: Normal rate, regular rhythm, normal heart sounds and intact distal pulses.   No murmur heard. Pulmonary/Chest: Effort normal. No respiratory distress. He has decreased breath sounds in the left middle field and the left lower field. He has no wheezes. He has rales in the left middle field and the left lower field.  Equal chest expansion Management sounds and crackles in the left lower lobes, right side clear, left upper clear No respiratory distress  Abdominal: Soft. Bowel sounds are normal. He exhibits no mass. There is no tenderness. There is no rebound and no guarding.  Soft and nontender  Musculoskeletal: Normal range of motion. He exhibits no edema.  Lymphadenopathy:    He has no cervical adenopathy.  Neurological: He is alert. Coordination normal.  Speech is clear and goal oriented Moves extremities without ataxia  Skin: Skin is warm and dry. He is not diaphoretic. There is erythema. There is pallor.  Right lower leg erythema with mild swelling; significantly smaller than outlined borders  Psychiatric: He has a normal mood and affect.  Nursing note and vitals reviewed.   ED Course  Procedures (including critical care time) Labs Review Labs Reviewed  CBC WITH DIFFERENTIAL/PLATELET - Abnormal; Notable for the following:    RBC 3.72 (*)    Hemoglobin 12.1 (*)    HCT 37.5 (*)    MCV 100.8 (*)    RDW 18.5 (*)    Neutrophils Relative % 84 (*)    Lymphocytes Relative 7 (*)    Lymphs Abs 0.4 (*)    All other components within normal limits  COMPREHENSIVE METABOLIC PANEL - Abnormal; Notable for the following:    Glucose, Bld 140 (*)    Albumin 2.8 (*)    Alkaline Phosphatase 319  (*)    Total Bilirubin 1.5 (*)    GFR calc non Af Amer 85 (*)    All other components within normal limits  CULTURE, BLOOD (ROUTINE X 2)  CULTURE, BLOOD (ROUTINE X 2)  URINE CULTURE  URINALYSIS, ROUTINE W REFLEX MICROSCOPIC  INFLUENZA PANEL BY PCR (TYPE A & B, H1N1)  GI PATHOGEN PANEL BY PCR, STOOL  LIPASE, BLOOD  I-STAT CG4 LACTIC ACID, ED  I-STAT CG4 LACTIC ACID, ED    Imaging Review Dg Chest Port 1 View  01/12/2015   CLINICAL DATA:  Fever and emesis. Feet appear swollen and red. Patient is seen a cancer center yesterday and given fluids. Antibiotics for cellulitis yesterday.  EXAM: PORTABLE CHEST - 1 VIEW  COMPARISON:  01/08/2015  FINDINGS: Power port type central venous catheter with tip over the low SVC region. Shallow inspiration. Cardiac enlargement with mild vascular congestion. No definite edema. Hazy opacity in the left lung base obscuring the left hemidiaphragm suggesting focal pneumonia in the left lower lung. No pneumothorax. No blunting of costophrenic angles. Calcification of the aorta.  IMPRESSION: Cardiac enlargement with mild pulmonary vascular congestion. Infiltration in the left lower lung likely representing pneumonia.   Electronically Signed   By: Lucienne Capers M.D.   On: 01/12/2015 23:50     EKG Interpretation None      MDM   Final diagnoses:  HCAP (healthcare-associated pneumonia)  Non-intractable vomiting with nausea, vomiting of unspecified type  Diarrhea  Malignant neoplasm of head of pancreas  Generalized weakness    [name] presents with nausea, vomiting and diarrhea.  Patient was febrile at home. Low-grade fever here. Tachycardia on arrival to emergency department however no tachycardia on my clinical exam. No hypotension. Patient with immunosuppression from chemotherapy treatment for stage IV pancreatic cancer.  Patient likely with viral gastroenteritis versus reaction to chemotherapy versus sepsis from infection.  We'll obtain labs,  give fluid bolus, begin antibiotics.  Patient with right lower leg cellulitis which seems to be improving. Patient also diagnosed with pneumonia 2 weeks ago, left lower lung fields diminished with crackles, concern for persistent pneumonia. Will obtain chest x-ray.  12:38 AM Lactic acid within normal limits, CBC without leukocytosis, CMP largely unremarkable.  Chest x-ray with left lower lung persistent pneumonia.   Influenza and C. difficile pending. Urinalysis pending. Patient will need admission for ongoing IV antibiotics and persistent vomiting.  1:26 AM Patient unable to urinate at this time. He refuses in and out cath. Will proceed with admission.  1:39 AM Discussed with Dr. Posey Pronto who will admit to tele.    The patient was discussed with and seen by Dr. Doy Mince who agrees with the treatment plan.  BP 148/81 mmHg  Pulse 94  Temp(Src) 99.6 F (37.6 C) (Rectal)  Resp 19  Ht 6\' 2"  (1.88 m)  Wt 230 lb (104.327 kg)  BMI 29.52 kg/m2  SpO2 95%    Abigail Butts, PA-C 01/13/15 [place] III, MD 01/13/15 2120

## 2015-01-12 NOTE — Assessment & Plan Note (Signed)
Patient complaining of minimal appetite and poor oral intake secondary to dysphagia. He states that he had the same symptoms when he was diagnosed with ulcer previously. On exam- patient appears to be managing all secretions with no  difficulty whatsoever. Will continue to monitor closely.

## 2015-01-13 ENCOUNTER — Encounter (HOSPITAL_COMMUNITY): Payer: Self-pay

## 2015-01-13 ENCOUNTER — Inpatient Hospital Stay (HOSPITAL_COMMUNITY): Payer: Medicare Other

## 2015-01-13 DIAGNOSIS — G8929 Other chronic pain: Secondary | ICD-10-CM | POA: Diagnosis present

## 2015-01-13 DIAGNOSIS — G4733 Obstructive sleep apnea (adult) (pediatric): Secondary | ICD-10-CM | POA: Diagnosis present

## 2015-01-13 DIAGNOSIS — J189 Pneumonia, unspecified organism: Secondary | ICD-10-CM

## 2015-01-13 DIAGNOSIS — Z6829 Body mass index (BMI) 29.0-29.9, adult: Secondary | ICD-10-CM | POA: Diagnosis not present

## 2015-01-13 DIAGNOSIS — E0840 Diabetes mellitus due to underlying condition with diabetic neuropathy, unspecified: Secondary | ICD-10-CM | POA: Diagnosis not present

## 2015-01-13 DIAGNOSIS — Z794 Long term (current) use of insulin: Secondary | ICD-10-CM | POA: Diagnosis not present

## 2015-01-13 DIAGNOSIS — E780 Pure hypercholesterolemia: Secondary | ICD-10-CM | POA: Diagnosis present

## 2015-01-13 DIAGNOSIS — Z87891 Personal history of nicotine dependence: Secondary | ICD-10-CM | POA: Diagnosis not present

## 2015-01-13 DIAGNOSIS — K1231 Oral mucositis (ulcerative) due to antineoplastic therapy: Secondary | ICD-10-CM

## 2015-01-13 DIAGNOSIS — R109 Unspecified abdominal pain: Secondary | ICD-10-CM | POA: Diagnosis present

## 2015-01-13 DIAGNOSIS — I10 Essential (primary) hypertension: Secondary | ICD-10-CM

## 2015-01-13 DIAGNOSIS — Z9049 Acquired absence of other specified parts of digestive tract: Secondary | ICD-10-CM | POA: Diagnosis present

## 2015-01-13 DIAGNOSIS — K861 Other chronic pancreatitis: Secondary | ICD-10-CM | POA: Diagnosis present

## 2015-01-13 DIAGNOSIS — H269 Unspecified cataract: Secondary | ICD-10-CM | POA: Diagnosis present

## 2015-01-13 DIAGNOSIS — Z85828 Personal history of other malignant neoplasm of skin: Secondary | ICD-10-CM | POA: Diagnosis not present

## 2015-01-13 DIAGNOSIS — E876 Hypokalemia: Secondary | ICD-10-CM | POA: Diagnosis present

## 2015-01-13 DIAGNOSIS — K219 Gastro-esophageal reflux disease without esophagitis: Secondary | ICD-10-CM | POA: Diagnosis present

## 2015-01-13 DIAGNOSIS — E46 Unspecified protein-calorie malnutrition: Secondary | ICD-10-CM | POA: Diagnosis present

## 2015-01-13 DIAGNOSIS — L03115 Cellulitis of right lower limb: Secondary | ICD-10-CM

## 2015-01-13 DIAGNOSIS — C250 Malignant neoplasm of head of pancreas: Principal | ICD-10-CM

## 2015-01-13 DIAGNOSIS — E1122 Type 2 diabetes mellitus with diabetic chronic kidney disease: Secondary | ICD-10-CM

## 2015-01-13 DIAGNOSIS — I129 Hypertensive chronic kidney disease with stage 1 through stage 4 chronic kidney disease, or unspecified chronic kidney disease: Secondary | ICD-10-CM | POA: Diagnosis present

## 2015-01-13 DIAGNOSIS — D696 Thrombocytopenia, unspecified: Secondary | ICD-10-CM | POA: Diagnosis present

## 2015-01-13 DIAGNOSIS — T451X5A Adverse effect of antineoplastic and immunosuppressive drugs, initial encounter: Secondary | ICD-10-CM | POA: Diagnosis present

## 2015-01-13 DIAGNOSIS — C9210 Chronic myeloid leukemia, BCR/ABL-positive, not having achieved remission: Secondary | ICD-10-CM | POA: Diagnosis present

## 2015-01-13 DIAGNOSIS — R1084 Generalized abdominal pain: Secondary | ICD-10-CM

## 2015-01-13 DIAGNOSIS — R112 Nausea with vomiting, unspecified: Secondary | ICD-10-CM

## 2015-01-13 DIAGNOSIS — E860 Dehydration: Secondary | ICD-10-CM | POA: Diagnosis present

## 2015-01-13 DIAGNOSIS — E1140 Type 2 diabetes mellitus with diabetic neuropathy, unspecified: Secondary | ICD-10-CM | POA: Diagnosis present

## 2015-01-13 DIAGNOSIS — D630 Anemia in neoplastic disease: Secondary | ICD-10-CM | POA: Diagnosis present

## 2015-01-13 DIAGNOSIS — R630 Anorexia: Secondary | ICD-10-CM

## 2015-01-13 DIAGNOSIS — Z881 Allergy status to other antibiotic agents status: Secondary | ICD-10-CM | POA: Diagnosis not present

## 2015-01-13 DIAGNOSIS — G620 Drug-induced polyneuropathy: Secondary | ICD-10-CM | POA: Diagnosis present

## 2015-01-13 DIAGNOSIS — Z8711 Personal history of peptic ulcer disease: Secondary | ICD-10-CM | POA: Diagnosis not present

## 2015-01-13 DIAGNOSIS — M549 Dorsalgia, unspecified: Secondary | ICD-10-CM | POA: Diagnosis present

## 2015-01-13 DIAGNOSIS — Z7982 Long term (current) use of aspirin: Secondary | ICD-10-CM | POA: Diagnosis not present

## 2015-01-13 DIAGNOSIS — N183 Chronic kidney disease, stage 3 (moderate): Secondary | ICD-10-CM | POA: Diagnosis present

## 2015-01-13 LAB — COMPREHENSIVE METABOLIC PANEL
ALBUMIN: 2.7 g/dL — AB (ref 3.5–5.2)
ALK PHOS: 305 U/L — AB (ref 39–117)
ALT: 21 U/L (ref 0–53)
ANION GAP: 8 (ref 5–15)
AST: 28 U/L (ref 0–37)
BUN: 12 mg/dL (ref 6–23)
CALCIUM: 8 mg/dL — AB (ref 8.4–10.5)
CO2: 26 mmol/L (ref 19–32)
Chloride: 99 mmol/L (ref 96–112)
Creatinine, Ser: 0.77 mg/dL (ref 0.50–1.35)
GFR calc non Af Amer: 89 mL/min — ABNORMAL LOW (ref >90)
GLUCOSE: 117 mg/dL — AB (ref 70–99)
Potassium: 3.3 mmol/L — ABNORMAL LOW (ref 3.5–5.1)
Sodium: 133 mmol/L — ABNORMAL LOW (ref 135–145)
Total Bilirubin: 1.2 mg/dL (ref 0.3–1.2)
Total Protein: 5.8 g/dL — ABNORMAL LOW (ref 6.0–8.3)

## 2015-01-13 LAB — CBC WITH DIFFERENTIAL/PLATELET
BASOS ABS: 0 10*3/uL (ref 0.0–0.1)
BASOS PCT: 0 % (ref 0–1)
Basophils Absolute: 0 10*3/uL (ref 0.0–0.1)
Basophils Relative: 0 % (ref 0–1)
EOS ABS: 0 10*3/uL (ref 0.0–0.7)
EOS PCT: 0 % (ref 0–5)
Eosinophils Absolute: 0 10*3/uL (ref 0.0–0.7)
Eosinophils Relative: 0 % (ref 0–5)
HCT: 37.5 % — ABNORMAL LOW (ref 39.0–52.0)
HEMATOCRIT: 29 % — AB (ref 39.0–52.0)
HEMOGLOBIN: 12.1 g/dL — AB (ref 13.0–17.0)
Hemoglobin: 9.6 g/dL — ABNORMAL LOW (ref 13.0–17.0)
LYMPHS ABS: 0.3 10*3/uL — AB (ref 0.7–4.0)
Lymphocytes Relative: 7 % — ABNORMAL LOW (ref 12–46)
Lymphocytes Relative: 5 % — ABNORMAL LOW (ref 12–46)
Lymphs Abs: 0.4 10*3/uL — ABNORMAL LOW (ref 0.7–4.0)
MCH: 32.5 pg (ref 26.0–34.0)
MCH: 33.3 pg (ref 26.0–34.0)
MCHC: 32.3 g/dL (ref 30.0–36.0)
MCHC: 33.1 g/dL (ref 30.0–36.0)
MCV: 100.8 fL — ABNORMAL HIGH (ref 78.0–100.0)
MCV: 100.7 fL — AB (ref 78.0–100.0)
Monocytes Absolute: 0.5 10*3/uL (ref 0.1–1.0)
Monocytes Absolute: 0.7 10*3/uL (ref 0.1–1.0)
Monocytes Relative: 9 % (ref 3–12)
Monocytes Relative: 11 % (ref 3–12)
NEUTROS PCT: 84 % — AB (ref 43–77)
Neutro Abs: 4.1 10*3/uL (ref 1.7–7.7)
Neutro Abs: 5.4 10*3/uL (ref 1.7–7.7)
Neutrophils Relative %: 84 % — ABNORMAL HIGH (ref 43–77)
Platelets: 150 10*3/uL (ref 150–400)
Platelets: 167 10*3/uL (ref 150–400)
RBC: 3.72 MIL/uL — ABNORMAL LOW (ref 4.22–5.81)
RBC: 2.88 MIL/uL — ABNORMAL LOW (ref 4.22–5.81)
RDW: 18.5 % — AB (ref 11.5–15.5)
RDW: 18.4 % — AB (ref 11.5–15.5)
WBC: 5 10*3/uL (ref 4.0–10.5)
WBC: 6.5 10*3/uL (ref 4.0–10.5)

## 2015-01-13 LAB — URINALYSIS, ROUTINE W REFLEX MICROSCOPIC
Glucose, UA: NEGATIVE mg/dL
HGB URINE DIPSTICK: NEGATIVE
KETONES UR: 15 mg/dL — AB
LEUKOCYTES UA: NEGATIVE
Nitrite: NEGATIVE
Protein, ur: 30 mg/dL — AB
Specific Gravity, Urine: 1.022 (ref 1.005–1.030)
Urobilinogen, UA: 2 mg/dL — ABNORMAL HIGH (ref 0.0–1.0)
pH: 6 (ref 5.0–8.0)

## 2015-01-13 LAB — GLUCOSE, CAPILLARY
GLUCOSE-CAPILLARY: 121 mg/dL — AB (ref 70–99)
Glucose-Capillary: 103 mg/dL — ABNORMAL HIGH (ref 70–99)
Glucose-Capillary: 109 mg/dL — ABNORMAL HIGH (ref 70–99)
Glucose-Capillary: 114 mg/dL — ABNORMAL HIGH (ref 70–99)

## 2015-01-13 LAB — STREP PNEUMONIAE URINARY ANTIGEN: Strep Pneumo Urinary Antigen: NEGATIVE

## 2015-01-13 LAB — URINE MICROSCOPIC-ADD ON

## 2015-01-13 LAB — I-STAT CG4 LACTIC ACID, ED: LACTIC ACID, VENOUS: 0.48 mmol/L — AB (ref 0.5–2.0)

## 2015-01-13 LAB — INFLUENZA PANEL BY PCR (TYPE A & B)
H1N1 flu by pcr: NOT DETECTED
INFLBPCR: NEGATIVE
Influenza A By PCR: NEGATIVE

## 2015-01-13 LAB — LIPASE, BLOOD: LIPASE: 13 U/L (ref 11–59)

## 2015-01-13 MED ORDER — SODIUM CHLORIDE 0.9 % IV SOLN
INTRAVENOUS | Status: DC
  Administered 2015-01-13 – 2015-01-15 (×5): via INTRAVENOUS

## 2015-01-13 MED ORDER — ACETAMINOPHEN 325 MG PO TABS: 650.0000 mg | ORAL_TABLET | Freq: Four times a day (QID) | ORAL | Status: DC | PRN

## 2015-01-13 MED ORDER — SODIUM CHLORIDE 0.9 % IJ SOLN
10.0000 mL | INTRAMUSCULAR | Status: DC | PRN
  Administered 2015-01-13 – 2015-01-16 (×5): 10 mL
  Filled 2015-01-13 (×5): qty 40

## 2015-01-13 MED ORDER — POTASSIUM CHLORIDE 10 MEQ/100ML IV SOLN
10.0000 meq | INTRAVENOUS | Status: AC
  Administered 2015-01-13 (×4): 10 meq via INTRAVENOUS
  Filled 2015-01-13 (×4): qty 100

## 2015-01-13 MED ORDER — ONDANSETRON HCL 4 MG/2ML IJ SOLN: 4.0000 mg | Freq: Three times a day (TID) | INTRAMUSCULAR | Status: DC | PRN

## 2015-01-13 MED ORDER — VANCOMYCIN HCL IN DEXTROSE 750-5 MG/150ML-% IV SOLN
750.0000 mg | Freq: Two times a day (BID) | INTRAVENOUS | Status: DC
  Administered 2015-01-13 – 2015-01-15 (×3): 750 mg via INTRAVENOUS
  Filled 2015-01-13 (×4): qty 150

## 2015-01-13 MED ORDER — CETYLPYRIDINIUM CHLORIDE 0.05 % MT LIQD
7.0000 mL | Freq: Two times a day (BID) | OROMUCOSAL | Status: DC
  Administered 2015-01-13 – 2015-01-16 (×7): 7 mL via OROMUCOSAL

## 2015-01-13 MED ORDER — HYDRALAZINE HCL 20 MG/ML IJ SOLN: 10.0000 mg | Freq: Four times a day (QID) | INTRAMUSCULAR | Status: DC | PRN

## 2015-01-13 MED ORDER — PIPERACILLIN-TAZOBACTAM 3.375 G IVPB
3.3750 g | Freq: Three times a day (TID) | INTRAVENOUS | Status: DC
  Administered 2015-01-13 – 2015-01-15 (×7): 3.375 g via INTRAVENOUS
  Filled 2015-01-13 (×7): qty 50

## 2015-01-13 MED ORDER — LOSARTAN POTASSIUM 50 MG PO TABS
50.0000 mg | ORAL_TABLET | Freq: Every day | ORAL | Status: DC
  Administered 2015-01-13 – 2015-01-16 (×4): 50 mg via ORAL
  Filled 2015-01-13 (×4): qty 1

## 2015-01-13 MED ORDER — ONDANSETRON HCL 4 MG/2ML IJ SOLN
4.0000 mg | Freq: Once | INTRAMUSCULAR | Status: AC
  Administered 2015-01-13: 4 mg via INTRAVENOUS
  Filled 2015-01-13: qty 2

## 2015-01-13 MED ORDER — SODIUM CHLORIDE 0.9 % IV SOLN
750.0000 mg | Freq: Two times a day (BID) | INTRAVENOUS | Status: DC
  Administered 2015-01-13: 750 mg via INTRAVENOUS
  Filled 2015-01-13 (×2): qty 750

## 2015-01-13 MED ORDER — PANTOPRAZOLE SODIUM 40 MG PO TBEC
40.0000 mg | DELAYED_RELEASE_TABLET | Freq: Two times a day (BID) | ORAL | Status: DC
  Administered 2015-01-13 – 2015-01-16 (×7): 40 mg via ORAL
  Filled 2015-01-13 (×7): qty 1

## 2015-01-13 MED ORDER — PROMETHAZINE HCL 25 MG/ML IJ SOLN
12.5000 mg | Freq: Once | INTRAMUSCULAR | Status: AC
Start: 2015-01-13 — End: 2015-01-13
  Administered 2015-01-13: 12.5 mg via INTRAVENOUS
  Filled 2015-01-13: qty 1

## 2015-01-13 MED ORDER — HEPARIN SODIUM (PORCINE) 5000 UNIT/ML IJ SOLN
5000.0000 [IU] | Freq: Three times a day (TID) | INTRAMUSCULAR | Status: DC
  Administered 2015-01-13 – 2015-01-16 (×10): 5000 [IU] via SUBCUTANEOUS
  Filled 2015-01-13 (×10): qty 1

## 2015-01-13 MED ORDER — INSULIN ASPART 100 UNIT/ML ~~LOC~~ SOLN
0.0000 [IU] | Freq: Four times a day (QID) | SUBCUTANEOUS | Status: DC
  Administered 2015-01-13 (×2): 2 [IU] via SUBCUTANEOUS
  Administered 2015-01-14: 3 [IU] via SUBCUTANEOUS
  Administered 2015-01-14 – 2015-01-15 (×2): 2 [IU] via SUBCUTANEOUS
  Administered 2015-01-15 (×2): 3 [IU] via SUBCUTANEOUS
  Administered 2015-01-15: 2 [IU] via SUBCUTANEOUS

## 2015-01-13 MED ORDER — ONDANSETRON HCL 4 MG/2ML IJ SOLN
4.0000 mg | Freq: Four times a day (QID) | INTRAMUSCULAR | Status: DC | PRN
  Administered 2015-01-13: 4 mg via INTRAVENOUS
  Filled 2015-01-13: qty 2

## 2015-01-13 MED ORDER — PROMETHAZINE HCL 25 MG PO TABS: 25.0000 mg | ORAL_TABLET | Freq: Four times a day (QID) | ORAL | Status: DC | PRN

## 2015-01-13 MED ORDER — ASPIRIN EC 81 MG PO TBEC
81.0000 mg | DELAYED_RELEASE_TABLET | Freq: Every day | ORAL | Status: DC
  Administered 2015-01-13 – 2015-01-16 (×4): 81 mg via ORAL
  Filled 2015-01-13 (×4): qty 1

## 2015-01-13 NOTE — ED Notes (Addendum)
2nd NS bolus given, see MAR Patient without episodes of emesis or diarrhea since arriving in ED Patient remains in NAD VS WNL  Patient and patient's family deny complaints or needs at this time Side rails up, call bell in reach

## 2015-01-13 NOTE — Progress Notes (Signed)
[facility] PROGRESS NOTE  BARY LIMBACH QQP:[id number] DOB: Oct 22, 1941 DOA: 01/12/2015  PCP: [name], MD  Brief HPI: 74 year old Caucasian male with a history of pancreatic cancer receiving chemotherapy. He last received chemotherapy on 2/22. He presented with nausea, vomiting, which had been ongoing for a couple days. He was noted to have an infiltrate in his lungs. He was then admitted to the hospital.  Past medical history:  Past Medical History  Diagnosis Date  . Chronic back pain greater than 3 months duration   . Bulging discs   . Diabetes mellitus     type II; neuropathy;   . Hypertension   . High cholesterol   . Leukocytosis   . GERD (gastroesophageal reflux disease)   . Pancreatitis 2004  . Anxiety   . DDD (degenerative disc disease) 2005    lumbar spine  . History of smoking 08/01/2012  . Weight loss 08/01/2012  . Cough 08/01/2012  . Left leg pain 08/05/2012  . Chronic pancreatitis   . OSA (obstructive sleep apnea) 2010    does not use CPAP  . Nausea alone 06/13/2014  . Allergy   . Mucositis (ulcerative) due to antineoplastic therapy 07/02/2014  . Constipated 07/16/2014  . Gastric ulcer 08/23/2014  . Hx of radiation therapy 06/25/14-08/06/14    pancreas head, 50.4Gy  . 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 Pancreatic cancer 06/08/2014  . Chest pain 11/05/2014    Consultants: Oncology has been flagged  Procedures: None  Antibiotics: Vancomycin and Zosyn. 2/28  Subjective: Patient had done an episode of vomiting this morning. He denies any abdominal discomfort. No chest pain. Had a cough. Denies any shortness of breath. Has had a few loose stools in the last few days. None this morning. Feels weak.  Objective: Vital Signs  Filed Vitals:   01/13/15 0145 01/13/15 0149 01/13/15 0245 01/13/15 0644  BP: 153/71  153/80 149/68  Pulse: 94  93 85  Temp:   98.6 F (37 C) 98.5 F (36.9 C)  TempSrc:    Oral Oral  Resp: 19   18  Height:   6\' 2"  (1.88 m)   Weight:   102.5 kg (225 lb 15.5 oz)   SpO2:  95% 100% 99%    Intake/Output Summary (Last 24 hours) at 01/13/15 1206 Last data filed at 01/13/15 0700  Gross per 24 hour  Intake 226.25 ml  Output    200 ml  Net  26.25 ml   Filed Weights   01/12/15 2241 01/13/15 0245  Weight: 104.327 kg (230 lb) 102.5 kg (225 lb 15.5 oz)    General appearance: alert, cooperative, appears stated age and no distress Resp: Decreased air entry at the bases. Few crackles in the left side. No wheezing. Cardio: regular rate and rhythm, S1, S2 normal, no murmur, click, rub or gallop GI: Abdomen soft. Some discomfort on palpation but no significant tenderness. No rebound, rigidity or guarding. No masses or organomegaly. Bowel sounds are present. Extremities: The redness in the right lower extremity is improved. Neurologic: No focal deficits.  Lab Results:  Basic Metabolic Panel:  Recent Labs Lab 01/07/15 0929 01/08/15 2125 01/11/15 1304 01/12/15 2311 01/13/15 0450  NA 146* 143 141 135 133*  K 3.4* 3.7 4.0 3.5 3.3*  CL  --  108  --  98 99  CO2 23 27 24 27 26   GLUCOSE 168* 224* 151* 140* 117*  BUN 11.6 21 12.1 14 12   CREATININE  1.1 1.42* 0.8 0.84 0.77  CALCIUM 9.5 9.5 9.9 9.0 8.0*   Liver Function Tests:  Recent Labs Lab 01/07/15 0929 01/11/15 1304 01/12/15 2311 01/13/15 0450  AST 27 37* 30 28  ALT 11 18 22 21   ALKPHOS 220* 290* 319* 305*  BILITOT 0.55 1.57* 1.5* 1.2  PROT 6.0* 6.2* 6.1 5.8*  ALBUMIN 3.0* 2.8* 2.8* 2.7*    Recent Labs Lab 01/13/15  LIPASE 13   CBC:  Recent Labs Lab 01/07/15 0929 01/08/15 2125 01/11/15 1304 01/12/15 2311 01/13/15 0450  WBC 14.0* 8.9 7.8 5.0 6.5  NEUTROABS 11.8* 8.0* 7.4* 4.1 5.4  HGB 11.0* 10.6* 10.7* 12.1* 9.6*  HCT 33.8* 32.6* 33.0* 37.5* 29.0*  MCV 99.7* 101.6* 99.3* 100.8* 100.7*  PLT 232 187 234 150 167   CBG:  Recent Labs Lab 01/13/15 0641 01/13/15 1132  GLUCAP 103*  109*    Recent Results (from the past 240 hour(s))  Blood culture (routine x 2)     Status: None (Preliminary result)   Collection Time: 01/08/15  9:30 PM  Result Value Ref Range Status   Specimen Description BLOOD LEFT ANTECUBITAL  Final   Special Requests BOTTLES DRAWN AEROBIC AND ANAEROBIC 5CC  Final   Culture   Final           BLOOD CULTURE RECEIVED NO GROWTH TO DATE CULTURE WILL BE HELD FOR 5 DAYS BEFORE ISSUING A FINAL NEGATIVE REPORT Performed at [facility]    Report Status PENDING  Incomplete  Blood culture (routine x 2)     Status: None (Preliminary result)   Collection Time: 01/08/15  9:58 PM  Result Value Ref Range Status   Specimen Description BLOOD LEFT CHEST  Final   Special Requests BOTTLES DRAWN AEROBIC AND ANAEROBIC 5CC  Final   Culture   Final           BLOOD CULTURE RECEIVED NO GROWTH TO DATE CULTURE WILL BE HELD FOR 5 DAYS BEFORE ISSUING A FINAL NEGATIVE REPORT Performed at [facility]    Report Status PENDING  Incomplete      Studies/Results: Dg Chest Port 1 View  01/12/2015   CLINICAL DATA:  Fever and emesis. Feet appear swollen and red. Patient is seen a cancer center yesterday and given fluids. Antibiotics for cellulitis yesterday.  EXAM: PORTABLE CHEST - 1 VIEW  COMPARISON:  01/08/2015  FINDINGS: Power port type central venous catheter with tip over the low SVC region. Shallow inspiration. Cardiac enlargement with mild vascular congestion. No definite edema. Hazy opacity in the left lung base obscuring the left hemidiaphragm suggesting focal pneumonia in the left lower lung. No pneumothorax. No blunting of costophrenic angles. Calcification of the aorta.  IMPRESSION: Cardiac enlargement with mild pulmonary vascular congestion. Infiltration in the left lower lung likely representing pneumonia.   Electronically Signed   By: Lucienne Capers M.D.   On: 01/12/2015 23:50   Dg Abd Portable 2v  01/13/2015   CLINICAL DATA:  Nausea and vomiting.   EXAM: PORTABLE ABDOMEN - 2 VIEW  COMPARISON:  CT 12/26/2014  FINDINGS: No intraperitoneal free air on the decubitus view. No dilated loops of large or small bowel. Wall stent noted in the common bile duct. Stool in the rectum.  IMPRESSION: No evidence of bowel obstruction or free air. Biliary stent appears in good position.   Electronically Signed   By: Suzy Bouchard M.D.   On: 01/13/2015 09:35    Medications:  Scheduled: . antiseptic oral rinse  7  mL Mouth Rinse BID  . aspirin EC  81 mg Oral Daily  . heparin  5,000 Units Subcutaneous 3 times per day  . insulin aspart  0-15 Units Subcutaneous Q6H  . losartan  50 mg Oral Daily  . pantoprazole  40 mg Oral BID AC  . piperacillin-tazobactam (ZOSYN)  IV  3.375 g Intravenous Q8H  . potassium chloride  10 mEq Intravenous Q1 Hr x 4  . vancomycin (VANCOCIN) 750 mg IVPB  750 mg Intravenous Q12H   Continuous: . sodium chloride 75 mL/hr at 01/13/15 0407   OYD:XAJOINOMVEHMC, ondansetron (ZOFRAN) IV, sodium chloride  Assessment/Plan:  Principal Problem:   HCAP (healthcare-associated pneumonia) Active Problems:   Hypertension   Diabetes mellitus with neuropathy   CKD stage 3 due to type 2 diabetes mellitus   Malignant neoplasm of head of pancreas   Mucositis due to antineoplastic therapy   Anorexia   Cellulitis   Nausea with vomiting   Abdominal pain     HCAP (healthcare-associated pneumonia) This could also be aspiration considering that he has had multiple episodes of nausea and vomiting over the last few days. Continue broad-spectrum antibiotics for now. Blood cultures are pending. He does not have any oxygen requirements currently.   Nausea vomiting and abdominal pain/Dehydration Likely secondary to his cancer and recent chemotherapy. He last received chemotherapy on 2/22. Symptomatic treatment. Abdominal film did not show any concerning findings. If his symptoms do not improve, he may require CT scan. Monitor for now. Continue PPI.  Clear liquids. Continue IV fluids.  Pancreatic cancer Undergoing chemotherapy. He received cycle 2 day 1 of Abraxane and gemcitabine on 2/22. His oncologist has been flagged on Epic.  Diabetes mellitus type II Continue sliding scale coverage. His Lantus has been held for now along with metformin. HbA1c was 6.8 on 2/11. CBGs reasonably well controlled.  Right lower extremity Cellulitis. Patient was treated with Keflex as an outpatient. Cellulitis seems to be improving. Continue antibiotics as discussed above.   History of essential Hypertension Holding his antihypertensive agents for now. Blood pressure is somewhat elevated. Hydralazine as needed.  Hypokalemia This will be repleted.  DVT Prophylaxis: Heparin    Code Status: Full code  Family Communication: Discussed with the patient and his sister  Disposition Plan: Not ready for discharge    LOS: 0 days   [facility] Pager [phone] 01/13/2015, 12:06 PM  If 7PM-7AM, please contact night-coverage at [email or web address], password [email or web address]

## 2015-01-13 NOTE — Progress Notes (Signed)
Utilization review completed.  

## 2015-01-13 NOTE — Progress Notes (Signed)
Report from [place], Therapist, sports. Care assumed for pt at this time. Pt resting in bed, eyes closed, awakens on calling. Pt denies pain/nausea at this time. Wife bedside. Assessment unchanged from AM. Will continue to monitor pt.

## 2015-01-13 NOTE — ED Notes (Signed)
Dr. Wofford at bedside 

## 2015-01-13 NOTE — ED Notes (Signed)
Patient refusing in and out cath  PA made aware

## 2015-01-13 NOTE — ED Notes (Signed)
Patient unable to provide urine Per PA, In and Out cath to be performed Order placed

## 2015-01-13 NOTE — Progress Notes (Signed)
ANTIBIOTIC CONSULT NOTE - INITIAL  Pharmacy Consult for Vancomycin/Zosyn Indication: Sepsis  Allergies  Allergen Reactions  . Ace Inhibitors Other (See Comments)    Unknown  . Ciprofloxacin Rash    Patient Measurements: Height: 6\' 2"  (188 cm) Weight: 230 lb (104.327 kg) IBW/kg (Calculated) : 82.2   Vital Signs: Temp: 99.6 F (37.6 C) (02/27 2148) Temp Source: Rectal (02/27 2148) BP: 147/70 mmHg (02/28 0100) Pulse Rate: 94 (02/28 0100) Intake/Output from previous day: 02/27 0701 - 02/28 0700 In: 10 [I.V.:10] Out: -  Intake/Output from this shift: Total I/O In: 10 [I.V.:10] Out: -   Labs:  Recent Labs  01/11/15 1304 01/11/15 1304 01/12/15 2311  WBC 7.8  --  5.0  HGB 10.7*  --  12.1*  PLT 234  --  150  CREATININE  --  0.8 0.84   Estimated Creatinine Clearance: 102.3 mL/min (by C-G formula based on Cr of 0.84). No results for input(s): VANCOTROUGH, VANCOPEAK, VANCORANDOM, GENTTROUGH, GENTPEAK, GENTRANDOM, TOBRATROUGH, TOBRAPEAK, TOBRARND, AMIKACINPEAK, AMIKACINTROU, AMIKACIN in the last 72 hours.   Microbiology: Recent Results (from the past 720 hour(s))  Culture, blood (routine x 2)     Status: None   Collection Time: 12/26/14  5:25 PM  Result Value Ref Range Status   Specimen Description BLOOD LEFT HAND  Final   Special Requests BOTTLES DRAWN AEROBIC AND ANAEROBIC 5ML  Final   Culture   Final    NO GROWTH 5 DAYS Performed at [facility]    Report Status 01/01/2015 FINAL  Final  Culture, blood (routine x 2)     Status: None   Collection Time: 12/26/14  5:33 PM  Result Value Ref Range Status   Specimen Description BLOOD RIGHT HAND  Final   Special Requests BOTTLES DRAWN AEROBIC AND ANAEROBIC 5ML  Final   Culture   Final    KLEBSIELLA OXYTOCA Note: CRITICAL RESULT CALLED TO, READ BACK BY AND VERIFIED WITH: BEVERLY MISENHEIMER AT 12:35PM 12/31/14 BY LYONK Performed at [facility]    Report Status 01/01/2015 FINAL  Final   Organism ID,  Bacteria KLEBSIELLA OXYTOCA  Final      Susceptibility   Klebsiella oxytoca - MIC*    AMPICILLIN >=32 RESISTANT Resistant     AMPICILLIN/SULBACTAM INTERMEDIATE      CEFAZOLIN 16 INTERMEDIATE Intermediate     CEFEPIME <=1 SENSITIVE Sensitive     CEFTAZIDIME <=1 SENSITIVE Sensitive     CEFTRIAXONE <=1 SENSITIVE Sensitive     CIPROFLOXACIN <=0.25 SENSITIVE Sensitive     GENTAMICIN <=1 SENSITIVE Sensitive     IMIPENEM <=0.25 SENSITIVE Sensitive     PIP/TAZO <=4 SENSITIVE Sensitive     TOBRAMYCIN <=1 SENSITIVE Sensitive     TRIMETH/SULFA <=20 SENSITIVE Sensitive     * KLEBSIELLA OXYTOCA  Urine culture     Status: None   Collection Time: 12/26/14 10:04 PM  Result Value Ref Range Status   Specimen Description URINE, CLEAN CATCH  Final   Special Requests NONE  Final   Colony Count NO GROWTH Performed at [facility]   Final   Culture NO GROWTH Performed at [facility]   Final   Report Status 12/29/2014 FINAL  Final  MRSA PCR Screening     Status: None   Collection Time: 12/27/14 12:01 AM  Result Value Ref Range Status   MRSA by PCR NEGATIVE NEGATIVE Final    Comment:        The GeneXpert MRSA Assay (FDA approved for NASAL  specimens only), is one component of a comprehensive MRSA colonization surveillance program. It is not intended to diagnose MRSA infection nor to guide or monitor treatment for MRSA infections.   Blood culture (routine x 2)     Status: None (Preliminary result)   Collection Time: 01/08/15  9:30 PM  Result Value Ref Range Status   Specimen Description BLOOD LEFT ANTECUBITAL  Final   Special Requests BOTTLES DRAWN AEROBIC AND ANAEROBIC 5CC  Final   Culture   Final           BLOOD CULTURE RECEIVED NO GROWTH TO DATE CULTURE WILL BE HELD FOR 5 DAYS BEFORE ISSUING A FINAL NEGATIVE REPORT Performed at [facility]    Report Status PENDING  Incomplete  Blood culture (routine x 2)     Status: None (Preliminary result)   Collection Time:  01/08/15  9:58 PM  Result Value Ref Range Status   Specimen Description BLOOD LEFT CHEST  Final   Special Requests BOTTLES DRAWN AEROBIC AND ANAEROBIC 5CC  Final   Culture   Final           BLOOD CULTURE RECEIVED NO GROWTH TO DATE CULTURE WILL BE HELD FOR 5 DAYS BEFORE ISSUING A FINAL NEGATIVE REPORT Performed at [facility]    Report Status PENDING  Incomplete    Medical History: Past Medical History  Diagnosis Date  . Chronic back pain greater than 3 months duration   . Bulging discs   . Diabetes mellitus     type II; neuropathy;   . Hypertension   . High cholesterol   . Leukocytosis   . GERD (gastroesophageal reflux disease)   . Pancreatitis 2004  . Anxiety   . DDD (degenerative disc disease) 2005    lumbar spine  . History of smoking 08/01/2012  . Weight loss 08/01/2012  . Cough 08/01/2012  . Left leg pain 08/05/2012  . Chronic pancreatitis   . OSA (obstructive sleep apnea) 2010    does not use CPAP  . Nausea alone 06/13/2014  . Allergy   . Mucositis (ulcerative) due to antineoplastic therapy 07/02/2014  . Constipated 07/16/2014  . Gastric ulcer 08/23/2014  . Hx of radiation therapy 06/25/14-08/06/14    pancreas head, 50.4Gy  . 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 Pancreatic cancer 06/08/2014  . Chest pain 11/05/2014    Medications:  Scheduled:   Infusions:  . piperacillin-tazobactam (ZOSYN)  IV    . sodium chloride 1,000 mL (01/13/15 0110)   Followed by  . sodium chloride    . vancomycin 2,000 mg (01/13/15 0036)  . vancomycin (VANCOCIN) 750 mg IVPB     Assessment: 25 yoM with hx pancreatic Ca c/o emesis and decreased appetite. Zosyn and Vancomycin per Rx for Sepsis.   Goal of Therapy:  Vancomycin trough level 15-20 mcg/ml  Plan:   Zosyn 3.375 Gm IV q8h EI  Vancomcyin 2gm x1 then 750mg  IV q12h  F/u SCr/cultures/levels as needed  Lawana Pai R 01/13/2015,1:22 AM

## 2015-01-13 NOTE — H&P (Signed)
[facility] History and Physical  Patient: Evan [name]  MRN: [id number]  DOB: August 03, 1941  DOS: the patient was seen and examined on 01/13/2015 PCP: [name], MD  Chief Complaint: Abdominal pain  HPI: Evan [name] is a 74 y.o. male with Past medical history of pancreatic cancer, recent chemotherapy, diabetes mellitus with neuropathy, hypertension, GERD, sleep apnea, CML. The patient presented with complaints of abdominal pain associated with nausea and vomiting. He also had a cough which is worsening. He mentions this symptoms are ongoing for last 1 week. He was seen in the ER on 12/26/2014 with similar complaint at that time a CT scan of the abdomen did not show any acute abnormality and showed presence of persistent pancreatic mass. He was seen in ER again on 23rd with a similar complaint. He was seen by her his oncologist today for the similar complaint as well. He was also found to be having cellulitis and was placed on Keflex. He has not been able to eat anything since last 3 days. He complains of on and of diarrhea. He denies any active bleeding. Complete basal fatigue and lethargy ongoing for last few days. Also complains of cough ongoing for last few days. Denies any fever complains of some chills. He was given a fluid bolus by primary oncologist as well. Patient has completed treatment for pneumonia. Denies any headache chest pain neck pain focal deficit.  The patient is coming from home And at his baseline independent for most of his ADL.  Review of Systems: as mentioned in the history of present illness.  A Comprehensive review of the other systems is negative.  Past Medical History  Diagnosis Date  . Chronic back pain greater than 3 months duration   . Bulging discs   . Diabetes mellitus     type II; neuropathy;   . Hypertension   . High cholesterol   . Leukocytosis   . GERD (gastroesophageal reflux disease)   . Pancreatitis 2004  .  Anxiety   . DDD (degenerative disc disease) 2005    lumbar spine  . History of smoking 08/01/2012  . Weight loss 08/01/2012  . Cough 08/01/2012  . Left leg pain 08/05/2012  . Chronic pancreatitis   . OSA (obstructive sleep apnea) 2010    does not use CPAP  . Nausea alone 06/13/2014  . Allergy   . Mucositis (ulcerative) due to antineoplastic therapy 07/02/2014  . Constipated 07/16/2014  . Gastric ulcer 08/23/2014  . Hx of radiation therapy 06/25/14-08/06/14    pancreas head, 50.4Gy  . 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 Pancreatic cancer 06/08/2014  . Chest pain 11/05/2014   Past Surgical History  Procedure Laterality Date  . Cholecystectomy      laparoscopic  . Hernia repair      umbilical  . Skin cancer removed  2012    right ear, basal cell carcinoma.   Marland Kitchen Spinal stretching    . Colonoscopy  2012    [facility]  . Eus Left 06/07/2014    Procedure: UPPER ENDOSCOPIC ULTRASOUND (EUS) LINEAR;  Surgeon: Arta Silence, MD;  Location: [facility];  Service: Endoscopy;  Laterality: Left;  . Ercp N/A 06/07/2014    Procedure: ENDOSCOPIC RETROGRADE CHOLANGIOPANCREATOGRAPHY (ERCP);  Surgeon: Arta Silence, MD;  Location: [facility];  Service: Endoscopy;  Laterality: N/A;  . Eye surgery      bilateral cataracts with lens implant  . Cataract extraction Bilateral   . Port acath  placement Left 06/14/14  . Portacath placement Left 06/14/2014    Procedure: INSERTION PORT-A-CATH;  Surgeon: Stark Klein, MD;  Location: [facility]S;  Service: General;  Laterality: Left;  . Esophagogastroduodenoscopy N/A 08/15/2014    Procedure: ESOPHAGOGASTRODUODENOSCOPY (EGD);  Surgeon: Lear Ng, MD;  Location: [facility];  Service: Endoscopy;  Laterality: N/A;   Social History:  reports that he quit smoking about 10 years ago. He has never used smokeless tobacco. He reports that he does not drink alcohol or use illicit drugs.  Allergies  Allergen Reactions  . Ace  Inhibitors Other (See Comments)    Unknown  . Ciprofloxacin Rash    Family History  Problem Relation Age of Onset  . Heart attack Mother   . Cancer Mother 61    lung  . Heart attack Father   . Stroke Father     Prior to Admission medications   Medication Sig Start Date End Date Taking? Authorizing Provider  acetaminophen (TYLENOL) 500 MG tablet Take 1,000 mg by mouth every 6 (six) hours as needed for fever.   Yes Historical Provider, MD  aspirin EC 81 MG tablet Take 81 mg by mouth daily.   Yes Historical Provider, MD  B Complex-C (SUPER B COMPLEX/VITAMIN C PO) Take 1 tablet by mouth daily.    Yes Historical Provider, MD  cephALEXin (KEFLEX) 500 MG capsule Take 1 capsule (500 mg total) by mouth 4 (four) times daily. 01/11/15  Yes Drue Second, NP  cholecalciferol (VITAMIN D) 1000 UNITS tablet Take 1,000 Units by mouth daily.   Yes Historical Provider, MD  dasatinib (SPRYCEL) 50 MG tablet Take 50 mg by mouth daily.  01/15/14  Yes [name], MD  insulin glargine (LANTUS) 100 UNIT/ML injection Inject 26 Units into the skin at bedtime.  08/09/14  Yes Modena Jansky, MD  losartan (COZAAR) 50 MG tablet Take 50 mg by mouth daily. 12/06/14  Yes Historical Provider, MD  metFORMIN (GLUCOPHAGE) 1000 MG tablet Take 1,000 mg by mouth 2 (two) times daily with a meal.   Yes Historical Provider, MD  pantoprazole (PROTONIX) 40 MG tablet Take 1 tablet (40 mg total) by mouth daily. 08/23/14  Yes [name], MD  PRESCRIPTION MEDICATION Chemo at [facility] 01/07/15.   Yes Historical Provider, MD  promethazine (PHENERGAN) 25 MG tablet Take 25 mg by mouth every 6 (six) hours as needed for nausea or vomiting (nausea).   Yes Historical Provider, MD  sennosides-docusate sodium (SENOKOT-S) 8.6-50 MG tablet Take 2 tablets by mouth at bedtime.    Yes Historical Provider, MD  simvastatin (ZOCOR) 20 MG tablet Take 20 mg by mouth daily.    Yes Historical Provider, MD  vitamin B-12 (CYANOCOBALAMIN) 1000 MCG tablet Take 1,000 mcg  by mouth daily.   Yes Historical Provider, MD  cefdinir (OMNICEF) 300 MG capsule Take 1 capsule (300 mg total) by mouth 2 (two) times daily. Patient not taking: Reported on 01/12/2015 12/29/14   [name], MD  lactulose [facility]) 10 GM/15ML solution Take 10 g by mouth 2 (two) times daily as needed for moderate constipation.  06/21/14   Historical Provider, MD    Physical Exam: Filed Vitals:   01/13/15 0130 01/13/15 0145 01/13/15 0149 01/13/15 0245  BP: 150/77 153/71  153/80  Pulse: 91 94  93  Temp:    98.6 F (37 C)  TempSrc:    Oral  Resp: 18 19    Height:    6\' 2"  (1.88 m)  Weight:  102.5 kg (225 lb 15.5 oz)  SpO2:   95% 100%    General: Alert, Awake and Oriented to Time, Place and Person. Appear in mild distress Eyes: PERRL ENT: Oral Mucosa clear moist. Neck: no JVD Cardiovascular: S1 and S2 Present, aortic systolic Murmur, Peripheral Pulses Present Respiratory: Bilateral Air entry equal and Decreased right-sided Crackles, no wheezes Abdomen: Bowel Sound present, Soft and diffusely tender Skin: no Rash Extremities: Right leg redness, lateral Pedal edema, no calf tenderness Neurologic: Grossly no focal neuro deficit.  Labs on Admission:  CBC:  Recent Labs Lab 01/07/15 0929 01/08/15 2125 01/11/15 1304 01/12/15 2311 01/13/15 0450  WBC 14.0* 8.9 7.8 5.0 6.5  NEUTROABS 11.8* 8.0* 7.4* 4.1 5.4  HGB 11.0* 10.6* 10.7* 12.1* 9.6*  HCT 33.8* 32.6* 33.0* 37.5* 29.0*  MCV 99.7* 101.6* 99.3* 100.8* 100.7*  PLT 232 187 234 150 167    CMP     Component Value Date/Time   NA 133* 01/13/2015 0450   NA 141 01/11/2015 1304   K 3.3* 01/13/2015 0450   K 4.0 01/11/2015 1304   CL 99 01/13/2015 0450   CL 107 04/27/2013 0926   CO2 26 01/13/2015 0450   CO2 24 01/11/2015 1304   GLUCOSE 117* 01/13/2015 0450   GLUCOSE 151* 01/11/2015 1304   GLUCOSE 212* 04/27/2013 0926   BUN 12 01/13/2015 0450   BUN 12.1 01/11/2015 1304   CREATININE 0.77 01/13/2015 0450   CREATININE 0.8  01/11/2015 1304   CALCIUM 8.0* 01/13/2015 0450   CALCIUM 9.9 01/11/2015 1304   PROT 5.8* 01/13/2015 0450   PROT 6.2* 01/11/2015 1304   ALBUMIN 2.7* 01/13/2015 0450   ALBUMIN 2.8* 01/11/2015 1304   AST 28 01/13/2015 0450   AST 37* 01/11/2015 1304   ALT 21 01/13/2015 0450   ALT 18 01/11/2015 1304   ALKPHOS 305* 01/13/2015 0450   ALKPHOS 290* 01/11/2015 1304   BILITOT 1.2 01/13/2015 0450   BILITOT 1.57* 01/11/2015 1304   GFRNONAA 89* 01/13/2015 0450   GFRAA >90 01/13/2015 0450     Recent Labs Lab 01/13/15  LIPASE 13    No results for input(s): CKTOTAL, CKMB, CKMBINDEX, TROPONINI in the last 168 hours. BNP (last 3 results) No results for input(s): BNP in the last 8760 hours.  ProBNP (last 3 results) No results for input(s): PROBNP in the last 8760 hours.   Radiological Exams on Admission: Dg Chest Port 1 View  01/12/2015   CLINICAL DATA:  Fever and emesis. Feet appear swollen and red. Patient is seen a cancer center yesterday and given fluids. Antibiotics for cellulitis yesterday.  EXAM: PORTABLE CHEST - 1 VIEW  COMPARISON:  01/08/2015  FINDINGS: Power port type central venous catheter with tip over the low SVC region. Shallow inspiration. Cardiac enlargement with mild vascular congestion. No definite edema. Hazy opacity in the left lung base obscuring the left hemidiaphragm suggesting focal pneumonia in the left lower lung. No pneumothorax. No blunting of costophrenic angles. Calcification of the aorta.  IMPRESSION: Cardiac enlargement with mild pulmonary vascular congestion. Infiltration in the left lower lung likely representing pneumonia.   Electronically Signed   By: Lucienne Capers M.D.   On: 01/12/2015 23:50    Assessment/Plan Principal Problem:   HCAP (healthcare-associated pneumonia) Active Problems:   Hypertension   Diabetes mellitus with neuropathy   CKD stage 3 due to type 2 diabetes mellitus   Malignant neoplasm of head of pancreas   Mucositis due to  antineoplastic therapy   Anorexia   Cellulitis  Nausea with vomiting   Abdominal pain   1. HCAP (healthcare-associated pneumonia) The patient is presenting with numbness of nausea vomiting. He also had some cough. Some fever and chills. Mild leukocytosis. Chest x-ray shows infiltration on the left lower lung of possibly pneumonia. He has right-sided crackles. With this the patient will be treated with broad-spectrum antibiotics for healthcare associated pneumonia. Vancomycin and Zosyn. Gentle IV hydration. Follow cultures.  2. Nausea vomiting and abdominal pain. Likely secondary to his chronic pancreatitis along with enteric mass. Patient is also ongoing gait chemotherapy currently. At present the patient will remain nothing by mouth except medications. We will use when necessary Zofran when necessary pain medication. Twice a day Protonix. If there is no improvement in patient's symptoms may require CT scan.  3. Diabetes. Placing the patient on sliding scale.  4. Cellulitis. Patient was treated with Keflex as an outpatient. Cellulitis seems to be improving. We will continue him on broad-spectrum antibiotic for the same currently.  5. Hypertension. Holding at present.  Advance goals of care discussion: Full code. Patient prefers to be resuscitated and intubated and if there is no hope for recovery he would like to be placed on comfort care  DVT Prophylaxis: subcutaneous Heparin. Nutrition: Nothing by mouth  Disposition: Admitted to inpatient in telemetry unit.  Author: Berle Mull, MD [facility] Pager: [phone] 01/13/2015, 6:20 AM    If 7PM-7AM, please contact night-coverage [email or web address] Password [email or web address]

## 2015-01-14 ENCOUNTER — Encounter: Payer: Medicare Other | Admitting: Nurse Practitioner

## 2015-01-14 ENCOUNTER — Ambulatory Visit: Payer: Medicare Other

## 2015-01-14 ENCOUNTER — Other Ambulatory Visit: Payer: Medicare Other

## 2015-01-14 DIAGNOSIS — E1140 Type 2 diabetes mellitus with diabetic neuropathy, unspecified: Secondary | ICD-10-CM

## 2015-01-14 DIAGNOSIS — G620 Drug-induced polyneuropathy: Secondary | ICD-10-CM

## 2015-01-14 DIAGNOSIS — R110 Nausea: Secondary | ICD-10-CM

## 2015-01-14 DIAGNOSIS — D630 Anemia in neoplastic disease: Secondary | ICD-10-CM

## 2015-01-14 DIAGNOSIS — E876 Hypokalemia: Secondary | ICD-10-CM

## 2015-01-14 DIAGNOSIS — R600 Localized edema: Secondary | ICD-10-CM

## 2015-01-14 DIAGNOSIS — J188 Other pneumonia, unspecified organism: Secondary | ICD-10-CM

## 2015-01-14 DIAGNOSIS — D696 Thrombocytopenia, unspecified: Secondary | ICD-10-CM

## 2015-01-14 DIAGNOSIS — E46 Unspecified protein-calorie malnutrition: Secondary | ICD-10-CM

## 2015-01-14 DIAGNOSIS — C9211 Chronic myeloid leukemia, BCR/ABL-positive, in remission: Secondary | ICD-10-CM

## 2015-01-14 DIAGNOSIS — R109 Unspecified abdominal pain: Secondary | ICD-10-CM

## 2015-01-14 LAB — COMPREHENSIVE METABOLIC PANEL
ALK PHOS: 327 U/L — AB (ref 39–117)
ALT: 19 U/L (ref 0–53)
AST: 26 U/L (ref 0–37)
Albumin: 2.5 g/dL — ABNORMAL LOW (ref 3.5–5.2)
Anion gap: 7 (ref 5–15)
BUN: 9 mg/dL (ref 6–23)
CHLORIDE: 104 mmol/L (ref 96–112)
CO2: 28 mmol/L (ref 19–32)
Calcium: 8.2 mg/dL — ABNORMAL LOW (ref 8.4–10.5)
Creatinine, Ser: 0.76 mg/dL (ref 0.50–1.35)
GFR calc Af Amer: >90 mL/min (ref >90)
GFR, EST NON AFRICAN AMERICAN: 89 mL/min — AB (ref >90)
GLUCOSE: 115 mg/dL — AB (ref 70–99)
POTASSIUM: 3.3 mmol/L — AB (ref 3.5–5.1)
SODIUM: 139 mmol/L (ref 135–145)
TOTAL PROTEIN: 5.7 g/dL — AB (ref 6.0–8.3)
Total Bilirubin: 0.8 mg/dL (ref 0.3–1.2)

## 2015-01-14 LAB — URINE CULTURE: Colony Count: 4000

## 2015-01-14 LAB — CBC
HCT: 28.4 % — ABNORMAL LOW (ref 39.0–52.0)
HEMOGLOBIN: 9.4 g/dL — AB (ref 13.0–17.0)
MCH: 33.2 pg (ref 26.0–34.0)
MCHC: 33.1 g/dL (ref 30.0–36.0)
MCV: 100.4 fL — ABNORMAL HIGH (ref 78.0–100.0)
Platelets: 149 10*3/uL — ABNORMAL LOW (ref 150–400)
RBC: 2.83 MIL/uL — AB (ref 4.22–5.81)
RDW: 18.1 % — ABNORMAL HIGH (ref 11.5–15.5)
WBC: 6.8 10*3/uL (ref 4.0–10.5)

## 2015-01-14 LAB — GLUCOSE, CAPILLARY
GLUCOSE-CAPILLARY: 125 mg/dL — AB (ref 70–99)
Glucose-Capillary: 101 mg/dL — ABNORMAL HIGH (ref 70–99)
Glucose-Capillary: 131 mg/dL — ABNORMAL HIGH (ref 70–99)
Glucose-Capillary: 147 mg/dL — ABNORMAL HIGH (ref 70–99)
Glucose-Capillary: 152 mg/dL — ABNORMAL HIGH (ref 70–99)

## 2015-01-14 LAB — LEGIONELLA ANTIGEN, URINE

## 2015-01-14 LAB — MAGNESIUM: Magnesium: 1.3 mg/dL — ABNORMAL LOW (ref 1.5–2.5)

## 2015-01-14 MED ORDER — POTASSIUM CHLORIDE 10 MEQ/100ML IV SOLN
10.0000 meq | INTRAVENOUS | Status: AC
  Administered 2015-01-14 (×4): 10 meq via INTRAVENOUS
  Filled 2015-01-14 (×5): qty 100

## 2015-01-14 NOTE — Progress Notes (Signed)
[facility] PROGRESS NOTE  Evan [name] ZDG:[id number] DOB: 05-24-1941 DOA: 01/12/2015  PCP: [name], MD  Brief HPI: 74 year old Caucasian male with a history of pancreatic cancer receiving chemotherapy. He last received chemotherapy on 2/22. He presented with nausea, vomiting, which had been ongoing for a couple days. He was noted to have an infiltrate in his lungs. He was then admitted to the hospital.  Past medical history:  Past Medical History  Diagnosis Date  . Chronic back pain greater than 3 months duration   . Bulging discs   . Diabetes mellitus     type II; neuropathy;   . Hypertension   . High cholesterol   . Leukocytosis   . GERD (gastroesophageal reflux disease)   . Pancreatitis 2004  . Anxiety   . DDD (degenerative disc disease) 2005    lumbar spine  . History of smoking 08/01/2012  . Weight loss 08/01/2012  . Cough 08/01/2012  . Left leg pain 08/05/2012  . Chronic pancreatitis   . OSA (obstructive sleep apnea) 2010    does not use CPAP  . Nausea alone 06/13/2014  . Allergy   . Mucositis (ulcerative) due to antineoplastic therapy 07/02/2014  . Constipated 07/16/2014  . Gastric ulcer 08/23/2014  . Hx of radiation therapy 06/25/14-08/06/14    pancreas head, 50.4Gy  . 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 Pancreatic cancer 06/08/2014  . Chest pain 11/05/2014    Consultants: Oncology   Procedures: None  Antibiotics: Vancomycin and Zosyn. 2/28  Subjective: Patient denies further episodes of vomiting. Cough is better. Reports poor appetite. Wife is at the bedside.  Objective: Vital Signs  Filed Vitals:   01/13/15 1325 01/13/15 2100 01/14/15 0219 01/14/15 0602  BP: 148/75 144/73 142/73 147/71  Pulse: 79 85 83 87  Temp: 98.6 F (37 C) 98.4 F (36.9 C) 98.5 F (36.9 C) 98.8 F (37.1 C)  TempSrc: Oral Oral Oral Oral  Resp: 16 16 16 16   Height:      Weight:      SpO2: 99% 98% 94% 94%     Intake/Output Summary (Last 24 hours) at 01/14/15 1006 Last data filed at 01/14/15 0925  Gross per 24 hour  Intake 2793.75 ml  Output   3450 ml  Net -656.25 ml   Filed Weights   01/12/15 2241 01/13/15 0245  Weight: 104.327 kg (230 lb) 102.5 kg (225 lb 15.5 oz)    General appearance: alert, cooperative, appears stated age and no distress Resp: Decreased air entry at the bases. Few crackles in the left side. No wheezing. Cardio: regular rate and rhythm, S1, S2 normal, no murmur, click, rub or gallop GI: Abdomen soft. No tenderness elicited today. No masses or organomegaly. Bowel sounds are present. Extremities: The redness in the right lower extremity is improved. Neurologic: No focal deficits.  Lab Results:  Basic Metabolic Panel:  Recent Labs Lab 01/08/15 2125 01/11/15 1304 01/12/15 2311 01/13/15 0450 01/14/15 0450  NA 143 141 135 133* 139  K 3.7 4.0 3.5 3.3* 3.3*  CL 108  --  98 99 104  CO2 27 24 27 26 28   GLUCOSE 224* 151* 140* 117* 115*  BUN 21 12.1 14 12 9   CREATININE 1.42* 0.8 0.84 0.77 0.76  CALCIUM 9.5 9.9 9.0 8.0* 8.2*   Liver Function Tests:  Recent Labs Lab 01/11/15 1304 01/12/15 2311 01/13/15 0450 01/14/15 0450  AST 37* 30 28 26   ALT 18 22 21  19  ALKPHOS 290* 319* 305* 327*  BILITOT 1.57* 1.5* 1.2 0.8  PROT 6.2* 6.1 5.8* 5.7*  ALBUMIN 2.8* 2.8* 2.7* 2.5*    Recent Labs Lab 01/13/15  LIPASE 13   CBC:  Recent Labs Lab 01/08/15 2125 01/11/15 1304 01/12/15 2311 01/13/15 0450 01/14/15 0450  WBC 8.9 7.8 5.0 6.5 6.8  NEUTROABS 8.0* 7.4* 4.1 5.4  --   HGB 10.6* 10.7* 12.1* 9.6* 9.4*  HCT 32.6* 33.0* 37.5* 29.0* 28.4*  MCV 101.6* 99.3* 100.8* 100.7* 100.4*  PLT 187 234 150 167 149*   CBG:  Recent Labs Lab 01/13/15 1132 01/13/15 1823 01/13/15 1913 01/13/15 2326 01/14/15 0609  GLUCAP 109* 121* 114* 125* 101*    Recent Results (from the past 240 hour(s))  Blood culture (routine x 2)     Status: None (Preliminary result)    Collection Time: 01/08/15  9:30 PM  Result Value Ref Range Status   Specimen Description BLOOD LEFT ANTECUBITAL  Final   Special Requests BOTTLES DRAWN AEROBIC AND ANAEROBIC 5CC  Final   Culture   Final           BLOOD CULTURE RECEIVED NO GROWTH TO DATE CULTURE WILL BE HELD FOR 5 DAYS BEFORE ISSUING A FINAL NEGATIVE REPORT Performed at [facility]    Report Status PENDING  Incomplete  Blood culture (routine x 2)     Status: None (Preliminary result)   Collection Time: 01/08/15  9:58 PM  Result Value Ref Range Status   Specimen Description BLOOD LEFT CHEST  Final   Special Requests BOTTLES DRAWN AEROBIC AND ANAEROBIC 5CC  Final   Culture   Final           BLOOD CULTURE RECEIVED NO GROWTH TO DATE CULTURE WILL BE HELD FOR 5 DAYS BEFORE ISSUING A FINAL NEGATIVE REPORT Performed at [facility]    Report Status PENDING  Incomplete  Blood Culture (routine x 2)     Status: None (Preliminary result)   Collection Time: 01/12/15 11:11 PM  Result Value Ref Range Status   Specimen Description BLOOD LEFT CHEST  Final   Special Requests BOTTLES DRAWN AEROBIC AND ANAEROBIC 6ML  Final   Culture   Final           BLOOD CULTURE RECEIVED NO GROWTH TO DATE CULTURE WILL BE HELD FOR 5 DAYS BEFORE ISSUING A FINAL NEGATIVE REPORT Performed at [facility]    Report Status PENDING  Incomplete  Blood Culture (routine x 2)     Status: None (Preliminary result)   Collection Time: 01/12/15 11:22 PM  Result Value Ref Range Status   Specimen Description BLOOD RIGHT ANTECUBITAL  Final   Special Requests BOTTLES DRAWN AEROBIC AND ANAEROBIC 5ML  Final   Culture   Final           BLOOD CULTURE RECEIVED NO GROWTH TO DATE CULTURE WILL BE HELD FOR 5 DAYS BEFORE ISSUING A FINAL NEGATIVE REPORT Performed at [facility]    Report Status PENDING  Incomplete      Studies/Results: Dg Chest Port 1 View  01/12/2015   CLINICAL DATA:  Fever and emesis. Feet appear swollen and red.  Patient is seen a cancer center yesterday and given fluids. Antibiotics for cellulitis yesterday.  EXAM: PORTABLE CHEST - 1 VIEW  COMPARISON:  01/08/2015  FINDINGS: Power port type central venous catheter with tip over the low SVC region. Shallow inspiration. Cardiac enlargement with mild vascular congestion. No definite edema. Hazy opacity in  the left lung base obscuring the left hemidiaphragm suggesting focal pneumonia in the left lower lung. No pneumothorax. No blunting of costophrenic angles. Calcification of the aorta.  IMPRESSION: Cardiac enlargement with mild pulmonary vascular congestion. Infiltration in the left lower lung likely representing pneumonia.   Electronically Signed   By: Lucienne Capers M.D.   On: 01/12/2015 23:50   Dg Abd Portable 2v  01/13/2015   CLINICAL DATA:  Nausea and vomiting.  EXAM: PORTABLE ABDOMEN - 2 VIEW  COMPARISON:  CT 12/26/2014  FINDINGS: No intraperitoneal free air on the decubitus view. No dilated loops of large or small bowel. Wall stent noted in the common bile duct. Stool in the rectum.  IMPRESSION: No evidence of bowel obstruction or free air. Biliary stent appears in good position.   Electronically Signed   By: Suzy Bouchard M.D.   On: 01/13/2015 09:35    Medications:  Scheduled: . antiseptic oral rinse  7 mL Mouth Rinse BID  . aspirin EC  81 mg Oral Daily  . heparin  5,000 Units Subcutaneous 3 times per day  . insulin aspart  0-15 Units Subcutaneous Q6H  . losartan  50 mg Oral Daily  . pantoprazole  40 mg Oral BID AC  . piperacillin-tazobactam (ZOSYN)  IV  3.375 g Intravenous Q8H  . potassium chloride  10 mEq Intravenous Q1 Hr x 4  . vancomycin  750 mg Intravenous Q12H   Continuous: . sodium chloride 75 mL/hr at 01/14/15 6063   KZS:[id number], hydrALAZINE, ondansetron (ZOFRAN) IV, sodium chloride  Assessment/Plan:  Principal Problem:   HCAP (healthcare-associated pneumonia) Active Problems:   Hypertension   Diabetes mellitus with  neuropathy   CKD stage 3 due to type 2 diabetes mellitus   Malignant neoplasm of head of pancreas   Mucositis due to antineoplastic therapy   Anorexia   Cellulitis   Nausea with vomiting   Abdominal pain     HCAP (healthcare-associated pneumonia) This could also be aspiration considering that he has had multiple episodes of nausea and vomiting prior to admission. Continue broad-spectrum antibiotics for now. Blood cultures are pending. He does not have any oxygen requirements currently.   Nausea vomiting and abdominal pain/Dehydration Seems to be improving. This was likely secondary to his recent chemotherapy. He last received chemotherapy on 2/22. Symptomatic treatment. Abdominal film did not show any concerning findings. If his symptoms do not improve, he may require CT scan. Monitor for now. Continue PPI. Advance to full liquids. Continue IV fluids.  Pancreatic cancer Undergoing chemotherapy. He received cycle 2 day 1 of Abraxane and gemcitabine on 2/22. Oncology is following.  History of chronic myeloid leukemia He is supposed to be on Sprycel which has been held due to pneumonia. Oncology to address this further.  Diabetes mellitus type II Continue sliding scale coverage. His Lantus has been held for now along with metformin. HbA1c was 6.8 on 2/11. CBGs reasonably well controlled.  Right lower extremity Cellulitis. Patient was treated with Keflex as an outpatient. Cellulitis seems to be improving. Continue antibiotics as discussed above.   History of essential Hypertension Holding his antihypertensive agents for now. Blood pressure is somewhat elevated. Hydralazine as needed.  Hypokalemia Secondary to nausea and vomiting. This will be repleted. Check magnesium  DVT Prophylaxis: Heparin    Code Status: Full code  Family Communication: Discussed with the patient and his sister  Disposition Plan: Not ready for discharge    LOS: 1 day   [place]  [facility] Pager [phone]  01/14/2015, 10:06 AM  If 7PM-7AM, please contact night-coverage at [email or web address], password [facility]

## 2015-01-14 NOTE — Evaluation (Signed)
Physical Therapy Evaluation Patient Details Name: Evan [name] MRN: [id number] DOB: [birth year] Today's Date: 01/14/2015   History of Present Illness  74 yo male admitted with Pna. Hx of CML, HTn, DM, pancreatic cancer, neuropathy, chronic back pain.   Clinical Impression  On eval, pt required Min assist for mobility-able to ambulate ~450 feet. Pt tolerated activity well. Impaired balance noted. Will follow.     Follow Up Recommendations Home health PT;Supervision/Assistance - 24 hour    Equipment Recommendations  None recommended by PT    Recommendations for Other Services       Precautions / Restrictions Precautions Precautions: Fall Restrictions Weight Bearing Restrictions: No      Mobility  Bed Mobility               General bed mobility comments: pt oob in recliner  Transfers Overall transfer level: Needs assistance     Sit to Stand: Min guard         General transfer comment: close guard for safety  Ambulation/Gait Ambulation/Gait assistance: Min assist Ambulation Distance (Feet): 450 Feet Assistive device: None (IV pole) Gait Pattern/deviations: Step-through pattern;Decreased stride length     General Gait Details: Began ambulation with pt holding onto IV pole-Min guard assist. Transitioned to no external support-Min assist to stabilize.   Stairs            Wheelchair Mobility    Modified Rankin (Stroke Patients Only)       Balance                                             Pertinent Vitals/Pain Pain Assessment: No/denies pain    Home Living Family/patient expects to be discharged to:: Private residence Living Arrangements: Spouse/significant other Available Help at Discharge: Family Type of Home: House   Entrance Stairs-Rails: Right Entrance Stairs-Number of Steps: 8-garage; 6-front Home Layout: One level Home Equipment: Walker - 2 wheels;Cane - single point      Prior Function Level of  Independence: Independent               Hand Dominance        Extremity/Trunk Assessment   Upper Extremity Assessment: Defer to OT evaluation           Lower Extremity Assessment: Generalized weakness      Cervical / Trunk Assessment: Normal  Communication   Communication: No difficulties  Cognition Arousal/Alertness: Awake/alert Behavior During Therapy: WFL for tasks assessed/performed Overall Cognitive Status: Within Functional Limits for tasks assessed                      General Comments      Exercises General Exercises - Lower Extremity Hip Flexion/Marching: AROM;Both;10 reps;Standing Heel Raises: AROM;Both;10 reps;Standing Mini-Sqauts: AROM;10 reps;Standing      Assessment/Plan    PT Assessment Patient needs continued PT services  PT Diagnosis Difficulty walking;Generalized weakness   PT Problem List Decreased strength;Decreased activity tolerance;Decreased balance;Decreased mobility  PT Treatment Interventions DME instruction;Gait training;Functional mobility training;Therapeutic activities;Therapeutic exercise;Patient/family education;Balance training   PT Goals (Current goals can be found in the Care Plan section) Acute Rehab PT Goals Patient Stated Goal: get stronger. home soon. PT Goal Formulation: With family Time For Goal Achievement: 01/28/15 Potential to Achieve Goals: Good    Frequency Min 3X/week   Barriers to discharge  Co-evaluation               End of Session Equipment Utilized During Treatment: Gait belt Activity Tolerance: Patient tolerated treatment well Patient left: in chair;with call bell/phone within reach           Time: 1440-1455 PT Time Calculation (min) (ACUTE ONLY): 15 min   Charges:   PT Evaluation $Initial PT Evaluation Tier I: 1 Procedure     PT G Codes:        Weston Anna, MPT Pager: [phone]

## 2015-01-14 NOTE — Progress Notes (Signed)
INITIAL NUTRITION ASSESSMENT  DOCUMENTATION CODES Per approved criteria  -Not Applicable   INTERVENTION: Encourage PO intake Daily snacks when diet advanced RD to monitor PO intake  NUTRITION DIAGNOSIS: Increased nutrient needs related to pancreatic cancer and associated treatments as evidenced by estimated nutritional needs.   Goal: Pt to meet >/= 90% of their estimated nutrition needs   Monitor:  PO and supplemental intake, weight, labs, I/O's  Reason for Assessment: Pt identified as at nutrition risk on the Malnutrition Screen Tool  Admitting Dx: HCAP (healthcare-associated pneumonia)  ASSESSMENT: 74 year old Caucasian male with a history of pancreatic cancer receiving chemotherapy. He last received chemotherapy on 2/22. He presented with nausea, vomiting, which had been ongoing for a couple days.   Pt in room with family at bedside.   Per family, pt has not been eating well the past week d/t N/V from chemo. Prior to these symptoms, pt was eating well. Last chemo treatment was on 2/22. Pt denies any nausea today.  Pt on full liquids, PO intake: 30%.  Pt's weight has remained stable x 3 months.  Per family, pt does not like Ensure/Boost/Glucerna supplements. RD offered to provide milkshake at night while on full liquid diet but pt not interested. Encouraged pt to eat as much as he can tolerate. Pt would like daily snacks when diet is advanced.  Nutrition focused physical exam shows no sign of depletion of muscle mass or body fat.  Labs reviewed: Low K  Height: Ht Readings from Last 1 Encounters:  01/13/15 6\' 2"  (1.88 m)    Weight: Wt Readings from Last 1 Encounters:  01/13/15 225 lb 15.5 oz (102.5 kg)    Ideal Body Weight: 190 lb  % Ideal Body Weight: 118%  Wt Readings from Last 10 Encounters:  01/13/15 225 lb 15.5 oz (102.5 kg)  01/11/15 224 lb 11.2 oz (101.923 kg)  01/07/15 230 lb 12.8 oz (104.69 kg)  12/27/14 227 lb 11.8 oz (103.3 kg)  12/17/14 222 lb  3.2 oz (100.789 kg)  11/19/14 222 lb (100.699 kg)  11/05/14 230 lb (104.327 kg)  10/19/14 224 lb 3.2 oz (101.696 kg)  09/11/14 221 lb 3.2 oz (100.336 kg)  09/03/14 214 lb (97.07 kg)    Usual Body Weight: 257 lb (pre-chemo)  % Usual Body Weight: 88%  BMI:  Body mass index is 29 kg/(m^2).  Estimated Nutritional Needs: Kcal: 2500-2700 Protein: 125-135g Fluid: 2.5L/day  Skin: intact  Diet Order: Diet full liquid  EDUCATION NEEDS: -No education needs identified at this time   Intake/Output Summary (Last 24 hours) at 01/14/15 1323 Last data filed at 01/14/15 1128  Gross per 24 hour  Intake   3065 ml  Output   3450 ml  Net   -385 ml    Last BM: 2/27  Labs:   Recent Labs Lab 01/12/15 2311 01/13/15 0450 01/14/15 0450  NA 135 133* 139  K 3.5 3.3* 3.3*  CL 98 99 104  CO2 27 26 28   BUN 14 12 9   CREATININE 0.84 0.77 0.76  CALCIUM 9.0 8.0* 8.2*  GLUCOSE 140* 117* 115*    CBG (last 3)   Recent Labs  01/13/15 2326 01/14/15 0609 01/14/15 1150  GLUCAP 125* 101* 131*    Scheduled Meds: . antiseptic oral rinse  7 mL Mouth Rinse BID  . aspirin EC  81 mg Oral Daily  . heparin  5,000 Units Subcutaneous 3 times per day  . insulin aspart  0-15 Units Subcutaneous Q6H  . losartan  50 mg Oral Daily  . pantoprazole  40 mg Oral BID AC  . piperacillin-tazobactam (ZOSYN)  IV  3.375 g Intravenous Q8H  . potassium chloride  10 mEq Intravenous Q1 Hr x 4  . vancomycin  750 mg Intravenous Q12H    Continuous Infusions: . sodium chloride 50 mL/hr at 01/14/15 1128    Past Medical History  Diagnosis Date  . Chronic back pain greater than 3 months duration   . Bulging discs   . Diabetes mellitus     type II; neuropathy;   . Hypertension   . High cholesterol   . Leukocytosis   . GERD (gastroesophageal reflux disease)   . Pancreatitis 2004  . Anxiety   . DDD (degenerative disc disease) 2005    lumbar spine  . History of smoking 08/01/2012  . Weight loss 08/01/2012   . Cough 08/01/2012  . Left leg pain 08/05/2012  . Chronic pancreatitis   . OSA (obstructive sleep apnea) 2010    does not use CPAP  . Nausea alone 06/13/2014  . Allergy   . Mucositis (ulcerative) due to antineoplastic therapy 07/02/2014  . Constipated 07/16/2014  . Gastric ulcer 08/23/2014  . Hx of radiation therapy 06/25/14-08/06/14    pancreas head, 50.4Gy  . 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 Pancreatic cancer 06/08/2014  . Chest pain 11/05/2014    Past Surgical History  Procedure Laterality Date  . Cholecystectomy      laparoscopic  . Hernia repair      umbilical  . Skin cancer removed  2012    right ear, basal cell carcinoma.   Marland Kitchen Spinal stretching    . Colonoscopy  2012    [facility]  . Eus Left 06/07/2014    Procedure: UPPER ENDOSCOPIC ULTRASOUND (EUS) LINEAR;  Surgeon: Arta Silence, MD;  Location: [facility];  Service: Endoscopy;  Laterality: Left;  . Ercp N/A 06/07/2014    Procedure: ENDOSCOPIC RETROGRADE CHOLANGIOPANCREATOGRAPHY (ERCP);  Surgeon: Arta Silence, MD;  Location: [facility];  Service: Endoscopy;  Laterality: N/A;  . Eye surgery      bilateral cataracts with lens implant  . Cataract extraction Bilateral   . Port acath placement Left 06/14/14  . Portacath placement Left 06/14/2014    Procedure: INSERTION PORT-A-CATH;  Surgeon: Stark Klein, MD;  Location: [facility];  Service: General;  Laterality: Left;  . Esophagogastroduodenoscopy N/A 08/15/2014    Procedure: ESOPHAGOGASTRODUODENOSCOPY (EGD);  Surgeon: Lear Ng, MD;  Location: [facility];  Service: Endoscopy;  Laterality: N/A;    Clayton Bibles, MS, RD, LDN Pager: [phone] After Hours Pager: [phone]

## 2015-01-14 NOTE — Consult Note (Signed)
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 He had poor oral intake. He had intermittent diarrhea. He denied any fever, chills night sweats. He reported intermittent cough or shortness of breath. No neurological complaints. On admission, he also was found to have lower extremity cellulitis for which he was placed on IV antibiotics.  Patient had been seen at the Emergency Department on 2/10, at which time a CT of the abdomen and pelvis which was essentially unchanged from prior film. He was also found to have health care associated pneumonia requiring hospitalization based on abnormal CXR. He then was seen at the ER again on 2/23 with similar symptoms but discharged home with supportive meds.   Oncology History   Pancreatic cancer   Primary site: Pancreas   Staging method: AJCC 7th Edition   Clinical: Stage IIA (T3, N0, M0) signed by [name], MD on 06/13/2014  4:37 PM   Summary: Stage IIA (T3, N0, M0)       Malignant neoplasm of head of pancreas   06/05/2014 Imaging  MRI abdomen showed 2.2 cm lesion in the pancreatic head obstructing the common bile duct and the pancreatic duct is highly concerning for pancreatic adenocarcinoma.  There is moderate intra and extrahepatic biliary duct dilatation.    06/05/2014 Tumor Marker CA 19-9 is elevated at 1234   06/06/2014 Imaging Ct scan of chest showed 2 lung nodules, likely benign   06/07/2014 Procedure He had ERCP and stent  placement   06/07/2014 Procedure He had endoscopic ultrasound with  fine needle aspiration.   06/07/2014 Pathology Results Accession: [id number] FNA is positive for pancreatic adenoca   06/14/2014 Surgery He has port placement   06/25/2014 - 08/01/2014 Chemotherapy He received neoadjuvant chemotherapy with Gemzar   06/25/2014 - 08/06/2014 Radiation Therapy he received neoadjuvant radiation: 50.4 Gy   08/13/2014 - 08/23/2014 Hospital Admission The patient was admitted to the hospital related to uncontrolled nausea, vomiting and severe epigastric pain and was found to have significant gastric ulcer, resolved with conservative management and total parenteral nutrition   09/06/2014 Imaging Repeat CT scan show enlarging lung nodule, suspicious for early metastatic disease.   10/18/2014 Imaging CT scan of the chest, abdomen and pelvis show metastatic disease in the lung and liver.   10/18/2014 Tumor Marker CA-19-9 at 1745   10/22/2014 - 12/03/2014 Chemotherapy The patient is placed on modified FOLFOX chemotherapy   11/05/2014 Tumor Marker Ca19-9 at 2447   12/14/2014 Imaging CT scan showed disease progression in the liver   12/14/2014 Tumor Marker CA 19-9 at 2050   12/17/2014 -  Chemotherapy the patient was starting on Abraxane with reduced dose Gemzar   12/24/2014 Tumor Marker CA-19-9 is at 1387.   12/26/2014 - 12/29/2014 Hospital Admission the patient was admitted to the hospital for hospital-acquired pneumonia.   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 On 08/02/2012, bone marrow  aspirate and biopsy show hypocellular marrow with 3% blasts, consistent with chronic phase CML.  A followup bone marrow aspirate and biopsy in December 2013 showed that his bone marrow was in normal morphology.  His last BCR able from June 2014 showed that he is in complete remission with major molecular  response.    Past Medical History  Diagnosis Date  . Chronic back pain greater than 3 months duration   . Bulging discs   . Diabetes mellitus     type II; neuropathy;   . Hypertension   . High cholesterol   . Leukocytosis   . GERD (gastroesophageal reflux disease)   . Pancreatitis 2004  . Anxiety   . DDD (degenerative disc disease) 2005    lumbar spine  . History of smoking 08/01/2012  . Weight loss 08/01/2012  . Cough 08/01/2012  . Left leg pain 08/05/2012  . Chronic pancreatitis   . OSA (obstructive sleep apnea) 2010    does not use CPAP  . Nausea alone 06/13/2014  . Allergy   . Mucositis (ulcerative) due to antineoplastic therapy 07/02/2014  . Constipated 07/16/2014  . Gastric ulcer 08/23/2014  . Hx of radiation therapy 06/25/14-08/06/14    pancreas head, 50.4Gy  . 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 Pancreatic cancer 06/08/2014  . Chest pain 11/05/2014     MEDICATIONS:  Scheduled Meds: . antiseptic oral rinse  7 mL Mouth Rinse BID  . aspirin EC  81 mg Oral Daily  . heparin  5,000 Units Subcutaneous 3 times per day  . insulin aspart  0-15 Units Subcutaneous Q6H  . losartan  50 mg Oral Daily  . pantoprazole  40 mg Oral BID AC  . piperacillin-tazobactam (ZOSYN)  IV  3.375 g Intravenous Q8H  . vancomycin  750 mg Intravenous Q12H   Continuous Infusions: . sodium chloride 75 mL/hr at 01/14/15 0641   PRN Meds:.acetaminophen, hydrALAZINE, ondansetron (ZOFRAN) IV, sodium chloride  ALLERGIES:  Allergies  Allergen Reactions  . Ace Inhibitors Other (See Comments)    Unknown  . Ciprofloxacin Rash    Family History  Problem Relation Age of Onset  . Heart attack Mother   . Cancer Mother 41    lung  . Heart attack Father   . Stroke Father      Past Surgical History  Procedure Laterality Date  . Cholecystectomy      laparoscopic  . Hernia repair      umbilical  . Skin cancer removed  2012    right ear, basal cell  carcinoma.   Marland Kitchen Spinal stretching    . Colonoscopy  2012    [facility]  . Eus Left 06/07/2014    Procedure: UPPER ENDOSCOPIC ULTRASOUND (EUS) LINEAR;  Surgeon: Arta Silence, MD;  Location: [facility];  Service: Endoscopy;  Laterality: Left;  . Ercp N/A 06/07/2014    Procedure: ENDOSCOPIC RETROGRADE CHOLANGIOPANCREATOGRAPHY (ERCP);  Surgeon: Arta Silence, MD;  Location: [facility];  Service: Endoscopy;  Laterality: N/A;  . Eye surgery      bilateral cataracts with lens implant  . Cataract extraction Bilateral   . Port acath placement Left 06/14/14  . Portacath placement Left 06/14/2014    Procedure: INSERTION PORT-A-CATH;  Surgeon: Stark Klein, MD;  Location: [facility] ORS;  Service: General;  Laterality: Left;  . Esophagogastroduodenoscopy N/A 08/15/2014    Procedure: ESOPHAGOGASTRODUODENOSCOPY (EGD);  Surgeon: Lear Ng, MD;  Location: [facility];  Service: Endoscopy;  Laterality: N/A;  History   Social History  . Marital Status: Married    Spouse Name: N/A  . Number of Children: 2  . Years of Education: N/A   Occupational History  .      retired Programmer, [employer]   Social History Main Topics  . Smoking status: Former Smoker -- 1.50 packs/day for 40 years    Quit date: 01/24/2004  . Smokeless tobacco: Never Used  . Alcohol Use: No  . Drug Use: No  . Sexual Activity: No   Other Topics Concern  . Not on file   Social History Narrative     PHYSICAL EXAMINATION:  Filed Vitals:   01/14/15 0602  BP: 147/71  Pulse: 87  Temp: 98.8 F (37.1 C)  Resp: 16      Intake/Output Summary (Last 24 hours) at 01/14/15 0916 Last data filed at 01/14/15 4540  Gross per 24 hour  Intake 2793.75 ml  Output   3250 ml  Net -456.25 ml    ECOG PERFORMANCE STATUS:  GENERAL:alert, no distress and comfortable SKIN: skin color, texture, turgor are normal, right lower extremity cellulitis improving. EYES: normal, conjunctiva are pink and non-injected, sclera clear OROPHARYNX:no  exudate, no erythema and lips, buccal mucosa, and tongue normal  NECK: supple, thyroid normal size, non-tender, without nodularity LYMPH:  no palpable lymphadenopathy in the cervical, axillary or inguinal LUNGS: clear to auscultation and percussion with normal breathing effort HEART: regular rate & rhythm and no murmurs with bilateral lower extremity edema ABDOMEN:abdomen soft, non-tender and normal bowel sounds Musculoskeletal:no cyanosis of digits and no clubbing  PSYCH: alert & oriented x 3 with fluent speech NEURO: no focal motor/sensory deficits  LABORATORY/RADIOLOGY DATA:   Recent Labs Lab 01/07/15 0929 01/08/15 2125 01/11/15 1304 01/12/15 2311 01/13/15 0450 01/14/15 0450  WBC 14.0* 8.9 7.8 5.0 6.5 6.8  HGB 11.0* 10.6* 10.7* 12.1* 9.6* 9.4*  HCT 33.8* 32.6* 33.0* 37.5* 29.0* 28.4*  PLT 232 187 234 150 167 149*  MCV 99.7* 101.6* 99.3* 100.8* 100.7* 100.4*  MCH 32.4 33.0 32.3 32.5 33.3 33.2  MCHC 32.5 32.5 32.5 32.3 33.1 33.1  RDW 20.6* 20.1* 20.0* 18.5* 18.4* 18.1*  LYMPHSABS 1.2 0.5* 0.4* 0.4* 0.3*  --   MONOABS 0.9 0.4 0.1 0.5 0.7  --   EOSABS 0.1 0.0 0.0 0.0 0.0  --   BASOSABS 0.0 0.0 0.0 0.0 0.0  --     CMP    Recent Labs Lab 01/07/15 0929 01/08/15 2125 01/11/15 1304 01/12/15 2311 01/13/15 0450 01/14/15 0450  NA 146* 143 141 135 133* 139  K 3.4* 3.7 4.0 3.5 3.3* 3.3*  CL  --  108  --  98 99 104  CO2 23 27 24 27 26 28   GLUCOSE 168* 224* 151* 140* 117* 115*  BUN 11.6 21 12.1 14 12 9   CREATININE 1.1 1.42* 0.8 0.84 0.77 0.76  CALCIUM 9.5 9.5 9.9 9.0 8.0* 8.2*  AST 27  --  37* 30 28 26   ALT 11  --  18 22 21 19   ALKPHOS 220*  --  290* 319* 305* 327*  BILITOT 0.55  --  1.57* 1.5* 1.2 0.8        Component Value Date/Time   BILITOT 0.8 01/14/2015 0450   BILITOT 1.57* 01/11/2015 1304   BILIDIR 0.5* 05/29/2014 1425   IBILI 0.6 05/29/2014 1425       Urinalysis    Component Value Date/Time   COLORURINE AMBER* 01/13/2015 0128   APPEARANCEUR CLEAR  01/13/2015 0128  LABSPEC 1.022 01/13/2015 0128   PHURINE 6.0 01/13/2015 0128   GLUCOSEU NEGATIVE 01/13/2015 0128   HGBUR NEGATIVE 01/13/2015 0128   BILIRUBINUR SMALL* 01/13/2015 0128   KETONESUR 15* 01/13/2015 0128   PROTEINUR 30* 01/13/2015 0128   UROBILINOGEN 2.0* 01/13/2015 0128   NITRITE NEGATIVE 01/13/2015 0128   LEUKOCYTESUR NEGATIVE 01/13/2015 0128    Drugs of Abuse  No results found for: LABOPIA, COCAINSCRNUR, LABBENZ, AMPHETMU, THCU, LABBARB   Liver Function Tests:  Recent Labs Lab 01/07/15 0929 01/11/15 1304 01/12/15 2311 01/13/15 0450 01/14/15 0450  AST 27 37* 30 28 26   ALT 11 18 22 21 19   ALKPHOS 220* 290* 319* 305* 327*  BILITOT 0.55 1.57* 1.5* 1.2 0.8  PROT 6.0* 6.2* 6.1 5.8* 5.7*  ALBUMIN 3.0* 2.8* 2.8* 2.7* 2.5*    Recent Labs Lab 01/13/15  LIPASE 13   No results for input(s): AMMONIA in the last 168 hours.  CBG:  Recent Labs Lab 01/13/15 1132 01/13/15 1823 01/13/15 1913 01/13/15 2326 01/14/15 0609  GLUCAP 109* 121* 114* 125* 101*    Radiology Studies:  Ct Abdomen Pelvis W Contrast  12/26/2014   COMPARISON:  CT of the chest, abdomen and pelvis on 12/14/2014  FINDINGS: Visualized lung bases showed consolidation in the posterior left lower lobe with some areas of small cavitation and associated small left pleural effusion. Findings are concerning for acute pneumonia and the only abnormality present at the lung bases previously was a trace amount of pleural fluid. Similar changes are also seen at the posterior right lung base but to a lesser degree with associated trace right pleural effusion. Bilateral airspace abnormalities in the lower lungs may suggest potential aspiration and correlation suggested clinically.  Multiple liver metastases are again noted in both lobes of the liver. Lesions are similar in size compared to the recent restaging CT. Metal stent in the common bile duct continues to show patency with air present in the biliary tree. No  evidence of biliary obstruction.  Low-density mass in the body of the pancreas is similar and size in appearance with maximal diameter of approximately 3 cm. There is evidence of atrophy of the tail of the pancreas.  Compared to the prior study, there is some more nonspecific stranding in the retroperitoneum and also the mesentery without focal fluid collection or ascites. This is nonspecific but may relate to some relative third spacing of fluid. Bowel shows no evidence of obstruction or perforation. Stable small right adrenal nodule. Stable degenerative disc disease at L3-4. No bony metastasis identified.  IMPRESSION: 1. New pulmonary consolidation in both posterior lower lobes, left greater than right with associated small pleural effusions. Findings are consistent with pneumonia and suggest potential aspiration. 2. Similar CT appearance of metastatic disease throughout the liver and similar sized of pancreatic carcinoma. 3. Increased prominence of stranding in the retroperitoneum and mesentery. This is nonspecific and is not associated with overt evidence of carcinomatosis or ascites. This may relate to some increased third spacing of fluid.   Electronically Signed   By: Aletta Edouard M.D.   On: 12/26/2014 21:26   Dg Chest Port 1 View  01/12/2015    COMPARISON:  01/08/2015  FINDINGS: Power port type central venous catheter with tip over the low SVC region. Shallow inspiration. Cardiac enlargement with mild vascular congestion. No definite edema. Hazy opacity in the left lung base obscuring the left hemidiaphragm suggesting focal pneumonia in the left lower lung. No pneumothorax. No blunting of costophrenic angles. Calcification of the aorta.  IMPRESSION: Cardiac enlargement with mild pulmonary vascular congestion. Infiltration in the left lower lung likely representing pneumonia.   Electronically Signed   By: Lucienne Capers M.D.   On: 01/12/2015 23:50   Dg Abd Portable 2v  01/13/2015    COMPARISON:  CT  12/26/2014  FINDINGS: No intraperitoneal free air on the decubitus view. No dilated loops of large or small bowel. Wall stent noted in the common bile duct. Stool in the rectum.  IMPRESSION: No evidence of bowel obstruction or free air. Biliary stent appears in good position.   Electronically Signed   By: Suzy Bouchard M.D.   On: 01/13/2015 09:35    ASSESSMENT AND PLAN:  Malignant neoplasm of head of pancreas He is s/p cycle 2, day 1 on 01/07/15 of dose further reduced chemotherapy with Abraxane -20%  (due to recent hospitalization for pneumonia), along with reduced dose Gemzar, today Day 8. He received Decadron on 2/22 and then oral prednisone last G-CSF support received on 2/10. I will cancel his chemo treatment today Plan is to give him minimum 3 cycles of treatment before repeating imaging study.  CML (chronic myeloid leukemia) Currently on remission Last  Dasatinib taken on 2/28 He was scheduled to discontinue on 2/29. Can proceed with last dose.   Recent HCPA ((healthcare-associated pneumonia) On broad spectrum antibiotics Cultures are pending Appreciate primary team involvement.   Nausea, vomiting, abdominal pain Likely secondary to recent chemotherapy Abdominal x ray was negative for acute findings Symptoms are improved Continue supportive care  Anemia in neoplastic disease This is likely due to recent treatment, infection, dilution The patient denies recent history of bleeding such as epistaxis, hematuria or hematochezia.  [name]  Will observe for now.  He does not require transfusion now.  Thrombocytopenia This is due to malignancy, dilution, infection, blood loss  Monitor counts closely No transfusion is indicated at this time Hold Heparin if  platelets drop to less than 50,000  Neuropathy due to chemotherapeutic drug He is experiencing mild peripheral neuropathy from chemotherapy. He will continue dose adjusted treatment with  abraxane  Diabetes mellitus with neuropathy His blood sugar appears to be reasonably controlled. We will continue to monitor to closely while he is on treatment.  Right lower extremity edema & cellulitis This is due to cellulitis treated with Keflex Doppler ultrasound on 2/26 was negative for DVT  Malnutrition Appreciate Nutrition evaluation  Full Code  Discharge planning Up until the day of admission, PS score was very poor, borderline ECOG 2-3. Improved today. Appreciate PT consult. I estimate he would be here for at least another 48-72 hours due to slow improvement. His wife said he barely could eat today  Other medical issues as per admitting team   **Disclaimer: This note was dictated with voice recognition software. Similar sounding words can inadvertently be transcribed and this note may contain transcription errors which may not have been corrected upon publication of note.Sharene Butters E, PA-C 01/14/2015, 9:16 AM Aryel Edelen, MD 01/14/2015

## 2015-01-14 NOTE — Telephone Encounter (Signed)
error 

## 2015-01-15 ENCOUNTER — Other Ambulatory Visit: Payer: Self-pay | Admitting: Hematology and Oncology

## 2015-01-15 ENCOUNTER — Ambulatory Visit: Payer: Medicare Other

## 2015-01-15 ENCOUNTER — Telehealth: Payer: Self-pay | Admitting: Hematology and Oncology

## 2015-01-15 LAB — CULTURE, BLOOD (ROUTINE X 2)
CULTURE: NO GROWTH
Culture: NO GROWTH

## 2015-01-15 LAB — CBC
HCT: 27.8 % — ABNORMAL LOW (ref 39.0–52.0)
Hemoglobin: 9.1 g/dL — ABNORMAL LOW (ref 13.0–17.0)
MCH: 33.2 pg (ref 26.0–34.0)
MCHC: 32.7 g/dL (ref 30.0–36.0)
MCV: 101.5 fL — ABNORMAL HIGH (ref 78.0–100.0)
PLATELETS: 134 10*3/uL — AB (ref 150–400)
RBC: 2.74 MIL/uL — ABNORMAL LOW (ref 4.22–5.81)
RDW: 18.6 % — ABNORMAL HIGH (ref 11.5–15.5)
WBC: 5.3 10*3/uL (ref 4.0–10.5)

## 2015-01-15 LAB — BASIC METABOLIC PANEL
Anion gap: 6 (ref 5–15)
BUN: 6 mg/dL (ref 6–23)
CO2: 27 mmol/L (ref 19–32)
Calcium: 8.5 mg/dL (ref 8.4–10.5)
Chloride: 108 mmol/L (ref 96–112)
Creatinine, Ser: 0.86 mg/dL (ref 0.50–1.35)
GFR calc Af Amer: >90 mL/min (ref >90)
GFR, EST NON AFRICAN AMERICAN: 85 mL/min — AB (ref >90)
Glucose, Bld: 124 mg/dL — ABNORMAL HIGH (ref 70–99)
POTASSIUM: 3.2 mmol/L — AB (ref 3.5–5.1)
SODIUM: 141 mmol/L (ref 135–145)

## 2015-01-15 LAB — GLUCOSE, CAPILLARY
GLUCOSE-CAPILLARY: 108 mg/dL — AB (ref 70–99)
GLUCOSE-CAPILLARY: 172 mg/dL — AB (ref 70–99)
Glucose-Capillary: 135 mg/dL — ABNORMAL HIGH (ref 70–99)
Glucose-Capillary: 194 mg/dL — ABNORMAL HIGH (ref 70–99)

## 2015-01-15 MED ORDER — MAGNESIUM SULFATE 2 GM/50ML IV SOLN
2.0000 g | Freq: Once | INTRAVENOUS | Status: AC
  Administered 2015-01-15: 2 g via INTRAVENOUS
  Filled 2015-01-15: qty 50

## 2015-01-15 MED ORDER — POTASSIUM CHLORIDE CRYS ER 20 MEQ PO TBCR
40.0000 meq | EXTENDED_RELEASE_TABLET | Freq: Once | ORAL | Status: AC
  Administered 2015-01-15: 40 meq via ORAL
  Filled 2015-01-15: qty 2

## 2015-01-15 MED ORDER — AZITHROMYCIN 500 MG PO TABS
500.0000 mg | ORAL_TABLET | Freq: Every day | ORAL | Status: DC
  Administered 2015-01-15 – 2015-01-16 (×2): 500 mg via ORAL
  Filled 2015-01-15 (×2): qty 1

## 2015-01-15 MED ORDER — CEFPODOXIME PROXETIL 200 MG PO TABS
200.0000 mg | ORAL_TABLET | Freq: Two times a day (BID) | ORAL | Status: DC
  Administered 2015-01-15 – 2015-01-16 (×3): 200 mg via ORAL
  Filled 2015-01-15 (×4): qty 1

## 2015-01-15 NOTE — Progress Notes (Signed)
Evan [name]   DOB:Nov 07, 1941   CH#:[id number]   OIN#:[id number]  Patient Care Team: [name], MD as PCP - General (Internal Medicine) [name], MD as Referring Physician (Specialist) Provider Not In System [name], MD as Consulting Physician (Hematology and Oncology)  I have seen the patient, examined him and edited the notes as follows  Subjective: Patient seen and examined. He is feeling better this morning. Denies fevers, chills, night sweats, vision changes, or mucositis. Denies any respiratory complaints. Denies any chest pain or palpitations. Denies lower extremity swelling. Reports less nausea. Denies vomiting. His abdominal pain is better controlled. No constipation. Appetite decreased. Denies any dysuria. Denies abnormal skin rashes, or neuropathy. Denies any bleeding issues such as epistaxis, hematemesis, hematuria or hematochezia. Tolerated physical therapy session.  Scheduled Meds: . antiseptic oral rinse  7 mL Mouth Rinse BID  . aspirin EC  81 mg Oral Daily  . heparin  5,000 Units Subcutaneous 3 times per day  . insulin aspart  0-15 Units Subcutaneous Q6H  . losartan  50 mg Oral Daily  . pantoprazole  40 mg Oral BID AC  . piperacillin-tazobactam (ZOSYN)  IV  3.375 g Intravenous Q8H  . vancomycin  750 mg Intravenous Q12H   Continuous Infusions: . sodium chloride 50 mL/hr at 01/15/15 0405   PRN Meds:acetaminophen, hydrALAZINE, ondansetron (ZOFRAN) IV, sodium chloride   Objective:  Filed Vitals:   01/15/15 0439  BP: 143/68  Pulse: 90  Temp: 98.6 F (37 C)  Resp: 16      Intake/Output Summary (Last 24 hours) at 01/15/15 0735 Last data filed at 01/15/15 0600  Gross per 24 hour  Intake 2894.58 ml  Output   2675 ml  Net 219.58 ml    ECOG PERFORMANCE STATUS:2-3  GENERAL:alert, no distress and comfortable SKIN: skin color, texture, turgor are normal,right leg cellulitis improving. EYES: normal, conjunctiva are pink and non-injected, sclera  clear OROPHARYNX:no exudate, no erythema and lips, buccal mucosa, and tongue normal  NECK: supple, thyroid normal size, non-tender, without nodularity LYMPH:  no palpable lymphadenopathy in the cervical, axillary or inguinal LUNGS: clear to auscultation and percussion with normal breathing effort HEART: regular rate & rhythm and no murmurs and no lower extremity edema ABDOMEN: soft, non-tender and normal bowel sounds Musculoskeletal:no cyanosis of digits and no clubbing  PSYCH: alert & oriented x 3 with fluent speech NEURO: no focal motor/sensory deficits    CBG (last 3)   Recent Labs  01/14/15 1718 01/14/15 2356 01/15/15 0550  GLUCAP 152* 147* 108*     Labs:   Recent Labs Lab 01/08/15 2125 01/11/15 1304 01/12/15 2311 01/13/15 0450 01/14/15 0450 01/15/15 0600  WBC 8.9 7.8 5.0 6.5 6.8 5.3  HGB 10.6* 10.7* 12.1* 9.6* 9.4* 9.1*  HCT 32.6* 33.0* 37.5* 29.0* 28.4* 27.8*  PLT 187 234 150 167 149* 134*  MCV 101.6* 99.3* 100.8* 100.7* 100.4* 101.5*  MCH 33.0 32.3 32.5 33.3 33.2 33.2  MCHC 32.5 32.5 32.3 33.1 33.1 32.7  RDW 20.1* 20.0* 18.5* 18.4* 18.1* 18.6*  LYMPHSABS 0.5* 0.4* 0.4* 0.3*  --   --   MONOABS 0.4 0.1 0.5 0.7  --   --   EOSABS 0.0 0.0 0.0 0.0  --   --   BASOSABS 0.0 0.0 0.0 0.0  --   --      Chemistries:    Recent Labs Lab 01/08/15 2125 01/11/15 1304 01/12/15 2311 01/13/15 0450 01/14/15 0450 01/15/15 0600  NA 143 141 135 133* 139 141  K 3.7 4.0 3.5 3.3* 3.3* 3.2*  CL 108  --  98 99 104 108  CO2 27 24 27 26 28 27   GLUCOSE 224* 151* 140* 117* 115* 124*  BUN 21 12.1 14 12 9 6   CREATININE 1.42* 0.8 0.84 0.77 0.76 0.86  CALCIUM 9.5 9.9 9.0 8.0* 8.2* 8.5  MG  --   --   --   --  1.3*  --   AST  --  37* 30 28 26   --   ALT  --  18 22 21 19   --   ALKPHOS  --  290* 319* 305* 327*  --   BILITOT  --  1.57* 1.5* 1.2 0.8  --     GFR Estimated Creatinine Clearance: 99.2 mL/min (by C-G formula based on Cr of 0.86).  Liver Function Tests:  Recent  Labs Lab 01/11/15 1304 01/12/15 2311 01/13/15 0450 01/14/15 0450  AST 37* 30 28 26   ALT 18 22 21 19   ALKPHOS 290* 319* 305* 327*  BILITOT 1.57* 1.5* 1.2 0.8  PROT 6.2* 6.1 5.8* 5.7*  ALBUMIN 2.8* 2.8* 2.7* 2.5*    Recent Labs Lab 01/13/15  LIPASE 13   No results for input(s): AMMONIA in the last 168 hours.  Urine Studies     Component Value Date/Time   COLORURINE AMBER* 01/13/2015 0128   APPEARANCEUR CLEAR 01/13/2015 0128   LABSPEC 1.022 01/13/2015 0128   PHURINE 6.0 01/13/2015 0128   GLUCOSEU NEGATIVE 01/13/2015 0128   HGBUR NEGATIVE 01/13/2015 0128   BILIRUBINUR SMALL* 01/13/2015 0128   KETONESUR 15* 01/13/2015 0128   PROTEINUR 30* 01/13/2015 0128   UROBILINOGEN 2.0* 01/13/2015 0128   NITRITE NEGATIVE 01/13/2015 0128   LEUKOCYTESUR NEGATIVE 01/13/2015 0128   CBG:  Recent Labs Lab 01/14/15 0609 01/14/15 1150 01/14/15 1718 01/14/15 2356 01/15/15 0550  GLUCAP 101* 131* 152* 147* 108*   Microbiology Cultures pending   Imaging Studies:  None today  Assessment/Plan: 74 y.o.  Malignant neoplasm of head of pancreas He is s/p cycle 2, day 1 on 01/07/15 of dose further reduced chemotherapy with Abraxane -20% (due to recent hospitalization for pneumonia), along with reduced dose Gemzar, today Day 9. He received Decadron on 2/22 and then oral prednisone last G-CSF support received on 2/10. Chemo treatment scheduled for 2/29 was canceled due to current hospitalization Plan is to give him minimum 3 cycles of treatment before repeating imaging study. Continue supportive care  CML (chronic myeloid leukemia) Currently on remission LastDasatinib taken on 2/29, resume as out-patient  Recent HCPA ((healthcare-associated pneumonia) On broad spectrum antibiotics Cultures are pending Appreciate primary team involvement.  Repeat CXR per patient's wife request tomorrow am  Nausea, vomiting, abdominal pain Likely secondary to recent chemotherapy Abdominal x ray was  negative for acute findings Symptoms are improved Continue supportive care  Anemia in neoplastic disease This is likely due to recent treatment, infection, dilution The patient denies recent history of bleeding such as epistaxis, hematuria or hematochezia.  [name] Will observe. He does not require transfusion now.  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 He will continue dose adjusted treatment with abraxane  Diabetes mellitus with neuropathy His blood sugar appears to be reasonably controlled. We will continue to monitor to closely while he  is on treatment.  Right lower extremity edema & cellulitis This is due to cellulitis treated with Keflex, now improved. Doppler ultrasound on 2/26 was negative for DVT  Protein calorie Malnutrition He continues to have poor oral intake Appreciate Nutrition evaluation  Deconditioning Appreciate PT/OT involvement  Full Code  Discharge planning Up until the day of admission, PS score was very poor, borderline ECOG 2-3. Improved today. Appreciate PT consult. Probably DC tomorrow Other medical issues as per admitting team    **Disclaimer: This note was dictated with voice recognition software. Similar sounding words can inadvertently be transcribed and this note may contain transcription errors which may not have been corrected upon publication of note.Sharene Butters E, PA-C 01/15/2015  7:35 AM Teal Bontrager, MD 01/15/2015

## 2015-01-15 NOTE — Progress Notes (Signed)
Brief Pharmacy Note:  Antibiotics changed from IV Vancomycin and Zosyn to PO Azithromycin and Vantin today for PNA/cellulitis.  SCr 0.86 CrCl ~ 99 mL/min WBC WNL Afebrile Cellulitis improving per MD note  Per request to adjust Vantin if needed:  Current Vantin dose of 200mg  PO q12h appropriate for renal function and indication.     Lindell Spar, PharmD, BCPS Pager: [phone] 01/15/2015 9:57 AM

## 2015-01-15 NOTE — Evaluation (Signed)
Occupational Therapy Evaluation Patient Details Name: BINH DOTEN MRN: [id number] DOB: [birth year] Today's Date: 01/15/2015    History of Present Illness 74 yo male admitted with Pna. Hx of CML, HTn, DM, pancreatic cancer, neuropathy, chronic back pain.    Clinical Impression   No further OT needed    Follow Up Recommendations  No OT follow up    Equipment Recommendations  None recommended by OT    Recommendations for Other Services       Precautions / Restrictions Precautions Precautions: Fall Restrictions Weight Bearing Restrictions: No      Mobility Bed Mobility Overal bed mobility: Needs Assistance Bed Mobility: Supine to Sit     Supine to sit: Supervision        Transfers Overall transfer level: Needs assistance   Transfers: Sit to/from Stand Sit to Stand: Min guard         General transfer comment: close guard for safety    Balance                                            ADL Overall ADL's : At baseline                                       General ADL Comments: wife helps with ADL activity      Vision     Perception     Praxis      Pertinent Vitals/Pain Pain Assessment: No/denies pain     Hand Dominance     Extremity/Trunk Assessment Upper Extremity Assessment Upper Extremity Assessment: Generalized weakness           Communication Communication Communication: No difficulties   Cognition Arousal/Alertness: Awake/alert Behavior During Therapy: WFL for tasks assessed/performed Overall Cognitive Status: Within Functional Limits for tasks assessed                     General Comments       Exercises       Shoulder Instructions      Home Living Family/patient expects to be discharged to:: Private residence Living Arrangements: Spouse/significant other Available Help at Discharge: Family Type of Home: House   Entrance [place] of Steps: 8-garage;  6-front Entrance Stairs-Rails: Right Home Layout: One level               Home Equipment: Walker - 2 wheels;Cane - single point          Prior Functioning/Environment Level of Independence: Independent                      OT Goals(Current goals can be found in the care plan section) Acute Rehab OT Goals Patient Stated Goal: get stronger. home soon.  OT Frequency:     Barriers to D/C:            Co-evaluation              End of Session Nurse Communication: Mobility status  Activity Tolerance: Patient tolerated treatment well Patient left: in chair;with family/visitor present;with call bell/phone within reach   Time: 1015-1040 OT Time Calculation (min): 25 min Charges:  OT General Charges $OT Visit: 1 Procedure OT Evaluation $Initial OT Evaluation Tier I: 1 Procedure OT Treatments $Self Care/Home Management : 8-22 mins  G-Codes:    Payton Mccallum D January 27, 2015, 11:06 AM

## 2015-01-15 NOTE — Telephone Encounter (Signed)
s.w. pt wife and advised on March 9 appt...ok and aware

## 2015-01-15 NOTE — Progress Notes (Signed)
Physical Therapy Treatment Patient Details Name: Evan [name] MRN: [id number] DOB: 11/16/1941 Today's Date: 01/15/2015    History of Present Illness 74 yo male admitted with Pna. Hx of CML, HTn, DM, pancreatic cancer, neuropathy, chronic back pain.     PT Comments    Pt continues to demonstrate impaired balance with standing/ambulation. Recommended to pt and wife that pt use RW temporarily-at least until HHPT can start to work with him and safely transition him back to cane use when appropriate.   Follow Up Recommendations  Home health PT;Supervision/Assistance - 24 hour     Equipment Recommendations  None recommended by PT    Recommendations for Other Services       Precautions / Restrictions Precautions Precautions: Fall Restrictions Weight Bearing Restrictions: No    Mobility  Bed Mobility Overal bed mobility: Needs Assistance Bed Mobility: Supine to Sit     Supine to sit: Supervision     General bed mobility comments: pt oob in recliner  Transfers Overall transfer level: Needs assistance Equipment used: Rolling walker (2 wheeled) Transfers: Sit to/from Stand Sit to Stand: Supervision         General transfer comment: close guard for safety  Ambulation/Gait Ambulation/Gait assistance: Min assist Ambulation Distance (Feet): 450 Feet Assistive device: None Gait Pattern/deviations: Step-through pattern;Decreased stride length;Trunk flexed     General Gait Details: Continues to require Min guard assist with use of IV pole and Min assist without external support. Pt tolerated activity well.    Stairs            Wheelchair Mobility    Modified Rankin (Stroke Patients Only)       Balance Overall balance assessment: Needs assistance             Standing balance comment: Standing exercises at sink: HRs, marching, mini-squats-LOB x 1 posteriorly with HRs.              High level balance activites: Side stepping;Backward  walking;Turns High Level Balance Comments: Side-stepping, backwards walking with pt holding onto handrail.     Cognition Arousal/Alertness: Awake/alert Behavior During Therapy: WFL for tasks assessed/performed Overall Cognitive Status: Within Functional Limits for tasks assessed                      Exercises General Exercises - Lower Extremity Hip Flexion/Marching: AROM;Both;10 reps;Standing Heel Raises: AROM;Both;10 reps;Standing Mini-Sqauts: AROM;10 reps;Standing    General Comments        Pertinent Vitals/Pain Pain Assessment: No/denies pain    Home Living Family/patient expects to be discharged to:: Private residence Living Arrangements: Spouse/significant other Available Help at Discharge: Family Type of Home: House   Entrance Stairs-Rails: Right Home Layout: One level Home Equipment: Environmental consultant - 2 wheels;Cane - single point      Prior Function Level of Independence: Independent          PT Goals (current goals can now be found in the care plan section) Acute Rehab PT Goals Patient Stated Goal: get stronger. home soon. Progress towards PT goals: Progressing toward goals    Frequency  Min 3X/week    PT Plan Current plan remains appropriate    Co-evaluation             End of Session Equipment Utilized During Treatment: Gait belt Activity Tolerance: Patient tolerated treatment well Patient left: in chair;with call bell/phone within reach;with family/visitor present     Time: 1140-1150 PT Time Calculation (min) (ACUTE ONLY): 10 min  Charges:  $  Gait Training: 8-22 mins                    G Codes:      Weston Anna, MPT Pager: [phone]

## 2015-01-15 NOTE — Care Management Note (Signed)
    Page 1 of 1   01/15/2015     2:42:42 PM CARE MANAGEMENT NOTE 01/15/2015  Patient:  Evan [name], Evan [name]   Account Number:  [id number]  Date Initiated:  01/15/2015  Documentation initiated by:  [facility]  Subjective/Objective Assessment:   adm: nausea, vomiting,     Action/Plan:   discharge planning   Anticipated DC Date:  01/16/2015   Anticipated DC Plan:  [place]  CM consult      [facility] Choice  HOME HEALTH   Choice offered to / List presented to:  C-1 Patient        [place] arranged  [place].   Status of service:  Completed, signed off Medicare Important Message given?  YES (If response is "NO", the following Medicare IM given date fields will be blank) Date Medicare IM given:  01/15/2015 Medicare IM given by:  [facility] Date Additional Medicare IM given:   Additional Medicare IM given by:    Discharge Disposition:  [place]  Per UR Regulation:    If discussed at Long Length of Stay Meetings, dates discussed:    Comments:  01/15/15 14:15 CM met with pt in room to offer choice of home health agency.  Pt chooses [facility] to render HHPT/OT.  Address and contact information verified by wife, Evan [name].  Pt has 3n1 and rolling walker at home.  Referral called to [facility] rep, Evan [name].  No other CM needs were communicated.  Evan [name], BSN, CM [phone].

## 2015-01-15 NOTE — Progress Notes (Signed)
[facility] PROGRESS NOTE  Evan [name] CBJ:[id number] DOB: 08/26/1941 DOA: 01/12/2015  PCP: [name], MD  Brief HPI: 74 year old Caucasian male with a history of pancreatic cancer receiving chemotherapy. He last received chemotherapy on 2/22. He presented with nausea, vomiting, which had been ongoing for a couple days. He was noted to have an infiltrate in his lungs. He was then admitted to the hospital.  Past medical history:  Past Medical History  Diagnosis Date  . Chronic back pain greater than 3 months duration   . Bulging discs   . Diabetes mellitus     type II; neuropathy;   . Hypertension   . High cholesterol   . Leukocytosis   . GERD (gastroesophageal reflux disease)   . Pancreatitis 2004  . Anxiety   . DDD (degenerative disc disease) 2005    lumbar spine  . History of smoking 08/01/2012  . Weight loss 08/01/2012  . Cough 08/01/2012  . Left leg pain 08/05/2012  . Chronic pancreatitis   . OSA (obstructive sleep apnea) 2010    does not use CPAP  . Nausea alone 06/13/2014  . Allergy   . Mucositis (ulcerative) due to antineoplastic therapy 07/02/2014  . Constipated 07/16/2014  . Gastric ulcer 08/23/2014  . Hx of radiation therapy 06/25/14-08/06/14    pancreas head, 50.4Gy  . 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 Pancreatic cancer 06/08/2014  . Chest pain 11/05/2014    Consultants: Oncology   Procedures: None  Antibiotics: Vancomycin and Zosyn. 2/28-3/1 Vantin and azithromycin 3/1  Subjective: Patient continues to feel better. Denies any vomiting. No diarrhea. Cough is better. Appetite is improving.   Objective: Vital Signs  Filed Vitals:   01/14/15 1014 01/14/15 1327 01/14/15 2044 01/15/15 0439  BP: 145/77 140/67 139/65 143/68  Pulse: 80 72 78 90  Temp:  98.1 F (36.7 C) 98.6 F (37 C) 98.6 F (37 C)  TempSrc:  Oral Oral Oral  Resp:  16 16 16   Height:      Weight:      SpO2:  96% 94% 94%     Intake/Output Summary (Last 24 hours) at 01/15/15 0852 Last data filed at 01/15/15 0600  Gross per 24 hour  Intake 2007.08 ml  Output   2475 ml  Net -467.92 ml   Filed Weights   01/12/15 2241 01/13/15 0245  Weight: 104.327 kg (230 lb) 102.5 kg (225 lb 15.5 oz)    General appearance: alert, cooperative, appears stated age and no distress Resp: Improved air entry bilaterally. Few crackles in the left side. No wheezing. Cardio: regular rate and rhythm, S1, S2 normal, no murmur, click, rub or gallop GI: Abdomen soft. No tenderness elicited today. No masses or organomegaly. Bowel sounds are present. Extremities: The redness in the right lower extremity is improved. Neurologic: No focal deficits.  Lab Results:  Basic Metabolic Panel:  Recent Labs Lab 01/08/15 2125 01/11/15 1304 01/12/15 2311 01/13/15 0450 01/14/15 0450 01/15/15 0600  NA 143 141 135 133* 139 141  K 3.7 4.0 3.5 3.3* 3.3* 3.2*  CL 108  --  98 99 104 108  CO2 27 24 27 26 28 27   GLUCOSE 224* 151* 140* 117* 115* 124*  BUN 21 12.1 14 12 9 6   CREATININE 1.42* 0.8 0.84 0.77 0.76 0.86  CALCIUM 9.5 9.9 9.0 8.0* 8.2* 8.5  MG  --   --   --   --  1.3*  --  Liver Function Tests:  Recent Labs Lab 01/11/15 1304 01/12/15 2311 01/13/15 0450 01/14/15 0450  AST 37* 30 28 26   ALT 18 22 21 19   ALKPHOS 290* 319* 305* 327*  BILITOT 1.57* 1.5* 1.2 0.8  PROT 6.2* 6.1 5.8* 5.7*  ALBUMIN 2.8* 2.8* 2.7* 2.5*    Recent Labs Lab 01/13/15  LIPASE 13   CBC:  Recent Labs Lab 01/08/15 2125 01/11/15 1304 01/12/15 2311 01/13/15 0450 01/14/15 0450 01/15/15 0600  WBC 8.9 7.8 5.0 6.5 6.8 5.3  NEUTROABS 8.0* 7.4* 4.1 5.4  --   --   HGB 10.6* 10.7* 12.1* 9.6* 9.4* 9.1*  HCT 32.6* 33.0* 37.5* 29.0* 28.4* 27.8*  MCV 101.6* 99.3* 100.8* 100.7* 100.4* 101.5*  PLT 187 234 150 167 149* 134*   CBG:  Recent Labs Lab 01/14/15 0609 01/14/15 1150 01/14/15 1718 01/14/15 2356 01/15/15 0550  GLUCAP 101* 131* 152* 147*  108*    Recent Results (from the past 240 hour(s))  Blood culture (routine x 2)     Status: None   Collection Time: 01/08/15  9:30 PM  Result Value Ref Range Status   Specimen Description BLOOD LEFT ANTECUBITAL  Final   Special Requests BOTTLES DRAWN AEROBIC AND ANAEROBIC 5CC  Final   Culture   Final    NO GROWTH 5 DAYS Performed at [facility]    Report Status 01/15/2015 FINAL  Final  Blood culture (routine x 2)     Status: None   Collection Time: 01/08/15  9:58 PM  Result Value Ref Range Status   Specimen Description BLOOD LEFT CHEST  Final   Special Requests BOTTLES DRAWN AEROBIC AND ANAEROBIC 5CC  Final   Culture   Final    NO GROWTH 5 DAYS Performed at [facility]    Report Status 01/15/2015 FINAL  Final  Blood Culture (routine x 2)     Status: None (Preliminary result)   Collection Time: 01/12/15 11:11 PM  Result Value Ref Range Status   Specimen Description BLOOD LEFT CHEST  Final   Special Requests BOTTLES DRAWN AEROBIC AND ANAEROBIC 6ML  Final   Culture   Final           BLOOD CULTURE RECEIVED NO GROWTH TO DATE CULTURE WILL BE HELD FOR 5 DAYS BEFORE ISSUING A FINAL NEGATIVE REPORT Performed at [facility]    Report Status PENDING  Incomplete  Blood Culture (routine x 2)     Status: None (Preliminary result)   Collection Time: 01/12/15 11:22 PM  Result Value Ref Range Status   Specimen Description BLOOD RIGHT ANTECUBITAL  Final   Special Requests BOTTLES DRAWN AEROBIC AND ANAEROBIC 5ML  Final   Culture   Final           BLOOD CULTURE RECEIVED NO GROWTH TO DATE CULTURE WILL BE HELD FOR 5 DAYS BEFORE ISSUING A FINAL NEGATIVE REPORT Performed at [facility]    Report Status PENDING  Incomplete  Urine culture     Status: None   Collection Time: 01/13/15  1:28 AM  Result Value Ref Range Status   Specimen Description URINE, CLEAN CATCH  Final   Special Requests NONE  Final   Colony Count   Final    4,000 COLONIES/ML Performed at  [facility]    Culture   Final    INSIGNIFICANT GROWTH Performed at [facility]    Report Status 01/14/2015 FINAL  Final      Studies/Results: No results  found.  Medications:  Scheduled: . antiseptic oral rinse  7 mL Mouth Rinse BID  . aspirin EC  81 mg Oral Daily  . azithromycin  500 mg Oral Daily  . cefpodoxime  200 mg Oral Q12H  . heparin  5,000 Units Subcutaneous 3 times per day  . insulin aspart  0-15 Units Subcutaneous Q6H  . losartan  50 mg Oral Daily  . magnesium sulfate 1 - 4 g bolus IVPB  2 g Intravenous Once  . pantoprazole  40 mg Oral BID AC  . potassium chloride  40 mEq Oral Once   Continuous: . sodium chloride 50 mL/hr at 01/15/15 0405   HFS:[id number], hydrALAZINE, ondansetron (ZOFRAN) IV, sodium chloride  Assessment/Plan:  Principal Problem:   HCAP (healthcare-associated pneumonia) Active Problems:   Hypertension   Diabetes mellitus with neuropathy   CKD stage 3 due to type 2 diabetes mellitus   Malignant neoplasm of head of pancreas   Mucositis due to antineoplastic therapy   Anorexia   Cellulitis   Nausea with vomiting   Abdominal pain     HCAP (healthcare-associated pneumonia) This could also be aspiration considering that he has had multiple episodes of nausea and vomiting prior to admission. Blood cultures are negative so far. We will change him to oral antibiotics. He does not have any oxygen requirements currently.   Nausea vomiting and abdominal pain/Dehydration Seems to be improving. This was likely secondary to his recent chemotherapy. He last received chemotherapy on 2/22. Symptomatic treatment. Abdominal film did not show any concerning findings. Continue PPI. Advance to soft diet. Cut Back on IV fluids.  Pancreatic cancer Undergoing chemotherapy. He received cycle 2 day 1 of Abraxane and gemcitabine on 2/22. Oncology is following.  History of chronic myeloid leukemia He is supposed to be on Sprycel which was  held due to pneumonia. Oncology to address this further.  Diabetes mellitus type II Continue sliding scale coverage. His Lantus has been held for now along with metformin. HbA1c was 6.8 on 2/11. CBGs reasonably well controlled.  Right lower extremity Cellulitis. Patient was treated with Keflex as an outpatient. Cellulitis seems to be improving. Continue antibiotics as discussed above.   History of essential Hypertension Holding his antihypertensive agents for now. Blood pressure is somewhat elevated. Hydralazine as needed.  Hypokalemia with hypomagnesemia Secondary to nausea and vomiting. Replace both potassium and magnesium. Repeat in the morning.   DVT Prophylaxis: Heparin    Code Status: Full code  Family Communication: Discussed with the patient  Disposition Plan: PT and OT to see. Anticipate discharge in 24-48 hours.    LOS: 2 days   [place] [facility] Pager [phone] 01/15/2015, 8:52 AM  If 7PM-7AM, please contact night-coverage at [email or web address], password [facility]

## 2015-01-16 ENCOUNTER — Inpatient Hospital Stay (HOSPITAL_COMMUNITY): Payer: Medicare Other

## 2015-01-16 DIAGNOSIS — G622 Polyneuropathy due to other toxic agents: Secondary | ICD-10-CM

## 2015-01-16 DIAGNOSIS — G893 Neoplasm related pain (acute) (chronic): Secondary | ICD-10-CM

## 2015-01-16 DIAGNOSIS — C9110 Chronic lymphocytic leukemia of B-cell type not having achieved remission: Secondary | ICD-10-CM

## 2015-01-16 DIAGNOSIS — D6959 Other secondary thrombocytopenia: Secondary | ICD-10-CM

## 2015-01-16 LAB — BASIC METABOLIC PANEL
Anion gap: 5 (ref 5–15)
CO2: 26 mmol/L (ref 19–32)
Calcium: 8.5 mg/dL (ref 8.4–10.5)
Chloride: 109 mmol/L (ref 96–112)
Creatinine, Ser: 0.95 mg/dL (ref 0.50–1.35)
GFR calc Af Amer: >90 mL/min (ref >90)
GFR, EST NON AFRICAN AMERICAN: 81 mL/min — AB (ref >90)
GLUCOSE: 115 mg/dL — AB (ref 70–99)
POTASSIUM: 3.4 mmol/L — AB (ref 3.5–5.1)
SODIUM: 140 mmol/L (ref 135–145)

## 2015-01-16 LAB — GLUCOSE, CAPILLARY
GLUCOSE-CAPILLARY: 215 mg/dL — AB (ref 70–99)
Glucose-Capillary: 118 mg/dL — ABNORMAL HIGH (ref 70–99)

## 2015-01-16 LAB — MAGNESIUM: MAGNESIUM: 1.6 mg/dL (ref 1.5–2.5)

## 2015-01-16 MED ORDER — CEFDINIR 300 MG PO CAPS: 300.0000 mg | ORAL_CAPSULE | Freq: Two times a day (BID) | ORAL | Status: DC

## 2015-01-16 MED ORDER — HEPARIN SOD (PORK) LOCK FLUSH 100 UNIT/ML IV SOLN
500.0000 [IU] | INTRAVENOUS | Status: AC | PRN
  Administered 2015-01-16: 500 [IU]

## 2015-01-16 MED ORDER — POTASSIUM CHLORIDE CRYS ER 20 MEQ PO TBCR
40.0000 meq | EXTENDED_RELEASE_TABLET | Freq: Once | ORAL | Status: AC
  Administered 2015-01-16: 40 meq via ORAL
  Filled 2015-01-16: qty 2

## 2015-01-16 MED ORDER — AZITHROMYCIN 500 MG PO TABS: 500.0000 mg | ORAL_TABLET | Freq: Every day | ORAL | Status: DC

## 2015-01-16 NOTE — Discharge Summary (Signed)
Physician Discharge Summary  Evan [name] TJQ:[id number] DOB: [birth year] DOA: 01/12/2015  PCP: [name], MD  Admit date: 01/12/2015 Discharge date: 01/16/2015  Recommendations for Outpatient Follow-up:  1. Pt will need to follow up with PCP in 2 weeks post discharge 2. Please obtain cbc in one week  Discharge Diagnoses:  HCAP (healthcare-associated pneumonia) This could also be aspiration considering that he has had multiple episodes of nausea and vomiting prior to admission. Blood cultures are negative so far. We will change him to oral antibiotics. He does not have any oxygen requirements currently.  -received 3 days IV vanco and zosyn -The patient remained stable on oral antibiotics 24 hours prior to discharge -Home with omnicef x 4 days and azithromycin x 3 days to finish 7 days of therapy  Nausea vomiting and abdominal pain/Dehydration Seems to be improving. This was likely secondary to his recent chemotherapy. He last received chemotherapy on 2/22. Symptomatic treatment. Abdominal film did not show any concerning findings. Continue PPI. Advance to soft diet which pt tolerated  Pancreatic cancer Undergoing chemotherapy. He received cycle 2 day 1 of Abraxane and gemcitabine on 2/22. Oncology is following. -Chemo treatment scheduled for 2/29 was canceled due to current hospitalization -He is s/p cycle 2, day 1 on 01/07/15 of dose further reduced chemotherapy with Abraxane -20% (due to recent hospitalization for pneumonia), along with reduced dose Gemzar, today Day 9. He received Decadron on 2/22 and then oral prednisone last G-CSF support received on 2/10. -follow up with Dr. Alvy Bimler after d/c  History of chronic myeloid leukemia He is supposed to be on Sprycel which was held due to pneumonia. Oncology to address this further. -LastDasatinib taken on 2/29, resume as out-patient  Diabetes mellitus type II Continue sliding scale coverage. His Lantus has been held  for now along with metformin. HbA1c was 6.8 on 12/27/14. CBGs reasonably well controlled. -d/c home on usual home dose Lantus and metformin  Right lower extremity Cellulitis. Patient was treated with Keflex as an outpatient. Cellulitis seems to be improving.  -Continue antibiotics as discussed above.  -resolved  Thrombocytopenia This is due to malignancy, dilution, infection, blood loss  Monitor counts closely No transfusion is indicated at this time Hold Heparin if platelets drop to less than 50,000  Anemia in neoplastic disease This is likely due to recent treatment, infection, dilution The patient denies recent history of bleeding such as epistaxis, hematuria or hematochezia.  [name] Will observe. He does not require transfusion now.  History of essential Hypertension Holding his antihypertensive agents for now. Blood pressure is somewhat elevated. Hydralazine as needed. -restart losartan after d/c  Hypokalemia with hypomagnesemia Secondary to nausea and vomiting. Replace both potassium and magnesium.  -repleted  Discharge Condition: stable  Disposition: home Follow-up Information    Follow up with [facility].   Why:  home health physical and occupational therapy   Contact information:   [address] [place] [address] [phone]       Diet:soft Wt Readings from Last 3 Encounters:  01/13/15 102.5 kg (225 lb 15.5 oz)  01/11/15 101.923 kg (224 lb 11.2 oz)  01/07/15 104.69 kg (230 lb 12.8 oz)    History of present illness:  74 y.o. male with a hx of chronic back pain, type 2 diabetes, hypertension, leukocytosis, GERD, pancreatitis, pancreatic cancer stage 4 on chemo, chronic myelocytic leukemia, peptic ulcer disease, ulcerative mucositis presents to the Emergency Department complaining of intetmittent emesis onset earlier in  the week with increased episodes. Pt was dx with pneumonia 2 weeks ago and kept  in the hospital several days for this. Pt denies abdominal pain, hematemesis, melena and hematochezia. Associated symptoms include diarrhea throughout the week.  The patient was started on intravenous vancomycin and Zosyn after blood cultures were obtained. The patient improved clinically. His diet was gradually advanced which he tolerated. The patient did not require any oxygen supplementation. His intravenous antibiotics were converted to oral antibiotics once his cultures were negative. He tolerated oral antibiotics without any difficulty. He will be discharged home with 4 more days of antibiotics to complete a one-week course  Consultants: Dr. Alvy Bimler  Discharge Exam: Filed Vitals:   01/16/15 0510  BP: 146/70  Pulse: 74  Temp: 98.1 F (36.7 C)  Resp: 20   Filed Vitals:   01/15/15 0439 01/15/15 1503 01/15/15 2300 01/16/15 0510  BP: 143/68 137/69 148/62 146/70  Pulse: 90 75 82 74  Temp: 98.6 F (37 C) 98 F (36.7 C) 98.6 F (37 C) 98.1 F (36.7 C)  TempSrc: Oral Oral Oral Oral  Resp: 16 19 20 20   Height:      Weight:      SpO2: 94% 97% 96% 96%   General: A&O x 3, NAD, pleasant, cooperative Cardiovascular: RRR, no rub, no gallop, no S3 Respiratory: Bibasilar crackles, left greater than right. No wheeze. Abdomen:soft, nontender, nondistended, positive bowel sounds Extremities: 1+ R>L edema, No lymphangitis, no petechiae  Discharge Instructions      Discharge Instructions    Diet - low sodium heart healthy    Complete by:  As directed      Increase activity slowly    Complete by:  As directed             Medication List    STOP taking these medications        cephALEXin 500 MG capsule  Commonly known as:  KEFLEX      TAKE these medications        acetaminophen 500 MG tablet  Commonly known as:  TYLENOL  Take 1,000 mg by mouth every 6 (six) hours as needed for fever.     aspirin EC 81 MG tablet  Take 81 mg by mouth daily.     azithromycin 500 MG tablet   Commonly known as:  ZITHROMAX  Take 1 tablet (500 mg total) by mouth daily. Start 01/17/15  Start taking on:  01/17/2015     cefdinir 300 MG capsule  Commonly known as:  OMNICEF  Take 1 capsule (300 mg total) by mouth 2 (two) times daily.     cholecalciferol 1000 UNITS tablet  Commonly known as:  VITAMIN D  Take 1,000 Units by mouth daily.     dasatinib 50 MG tablet  Commonly known as:  SPRYCEL  Take 50 mg by mouth daily.     insulin glargine 100 UNIT/ML injection  Commonly known as:  LANTUS  Inject 26 Units into the skin at bedtime.     lactulose 10 GM/15ML solution  Commonly known as:  CHRONULAC  Take 10 g by mouth 2 (two) times daily as needed for moderate constipation.     losartan 50 MG tablet  Commonly known as:  COZAAR  Take 50 mg by mouth daily.     metFORMIN 1000 MG tablet  Commonly known as:  GLUCOPHAGE  Take 1,000 mg by mouth 2 (two) times daily with a meal.     pantoprazole 40 MG tablet  Commonly known as:  PROTONIX  Take 1 tablet (40 mg total) by mouth daily.     PRESCRIPTION MEDICATION  Chemo at [facility] 01/07/15.     promethazine 25 MG tablet  Commonly known as:  PHENERGAN  Take 25 mg by mouth every 6 (six) hours as needed for nausea or vomiting (nausea).     sennosides-docusate sodium 8.6-50 MG tablet  Commonly known as:  SENOKOT-S  Take 2 tablets by mouth at bedtime.     simvastatin 20 MG tablet  Commonly known as:  ZOCOR  Take 20 mg by mouth daily.     SUPER B COMPLEX/VITAMIN C PO  Take 1 tablet by mouth daily.     vitamin B-12 1000 MCG tablet  Commonly known as:  CYANOCOBALAMIN  Take 1,000 mcg by mouth daily.         The results of significant diagnostics from this hospitalization (including imaging, microbiology, ancillary and laboratory) are listed below for reference.    Significant Diagnostic Studies: Ct Abdomen Pelvis W Contrast  12/26/2014   CLINICAL DATA:  Fever, nausea and vomiting. History of metastatic pancreatic carcinoma.   EXAM: CT ABDOMEN AND PELVIS WITH CONTRAST  TECHNIQUE: Multidetector CT imaging of the abdomen and pelvis was performed using the standard protocol following bolus administration of intravenous contrast.  CONTRAST:  77mL OMNIPAQUE IOHEXOL 300 MG/ML SOLN, 182mL OMNIPAQUE IOHEXOL 300 MG/ML SOLN  COMPARISON:  CT of the chest, abdomen and pelvis on 12/14/2014  FINDINGS: Visualized lung bases showed consolidation in the posterior left lower lobe with some areas of small cavitation and associated small left pleural effusion. Findings are concerning for acute pneumonia and the only abnormality present at the lung bases previously was a trace amount of pleural fluid. Similar changes are also seen at the posterior right lung base but to a lesser degree with associated trace right pleural effusion. Bilateral airspace abnormalities in the lower lungs may suggest potential aspiration and correlation suggested clinically.  Multiple liver metastases are again noted in both lobes of the liver. Lesions are similar in size compared to the recent restaging CT. Metal stent in the common bile duct continues to show patency with air present in the biliary tree. No evidence of biliary obstruction.  Low-density mass in the body of the pancreas is similar and size in appearance with maximal diameter of approximately 3 cm. There is evidence of atrophy of the tail of the pancreas.  Compared to the prior study, there is some more nonspecific stranding in the retroperitoneum and also the mesentery without focal fluid collection or ascites. This is nonspecific but may relate to some relative third spacing of fluid. Bowel shows no evidence of obstruction or perforation. Stable small right adrenal nodule. Stable degenerative disc disease at L3-4. No bony metastasis identified.  IMPRESSION: 1. New pulmonary consolidation in both posterior lower lobes, left greater than right with associated small pleural effusions. Findings are consistent with  pneumonia and suggest potential aspiration. 2. Similar CT appearance of metastatic disease throughout the liver and similar sized of pancreatic carcinoma. 3. Increased prominence of stranding in the retroperitoneum and mesentery. This is nonspecific and is not associated with overt evidence of carcinomatosis or ascites. This may relate to some increased third spacing of fluid.   Electronically Signed   By: Aletta Edouard M.D.   On: 12/26/2014 21:26   Dg Chest Port 1 View  01/12/2015   CLINICAL DATA:  Fever and emesis. Feet appear swollen and red. Patient is seen a cancer  center yesterday and given fluids. Antibiotics for cellulitis yesterday.  EXAM: PORTABLE CHEST - 1 VIEW  COMPARISON:  01/08/2015  FINDINGS: Power port type central venous catheter with tip over the low SVC region. Shallow inspiration. Cardiac enlargement with mild vascular congestion. No definite edema. Hazy opacity in the left lung base obscuring the left hemidiaphragm suggesting focal pneumonia in the left lower lung. No pneumothorax. No blunting of costophrenic angles. Calcification of the aorta.  IMPRESSION: Cardiac enlargement with mild pulmonary vascular congestion. Infiltration in the left lower lung likely representing pneumonia.   Electronically Signed   By: Lucienne Capers M.D.   On: 01/12/2015 23:50   Dg Chest Portable 1 View  01/08/2015   CLINICAL DATA:  Acute severe today, history of pancreas cancer. Receiving chemotherapy.  EXAM: PORTABLE CHEST - 1 VIEW  COMPARISON:  12/26/2014  FINDINGS: Background emphysema noted. Mild cardiomegaly with vascular and interstitial prominence, slightly worse. Developing edema suspected. No effusion, collapse or consolidation. No pneumothorax. Left subclavian power port catheter tip proximal SVC. Trachea is midline. Atherosclerosis of the aorta.  IMPRESSION: Mild cardiomegaly with mild developing interstitial edema.   Electronically Signed   By: Jerilynn Mages.  Shick M.D.   On: 01/08/2015 21:53   Dg Chest  Port 1 View  12/26/2014   CLINICAL DATA:  Fever. Currently on chemotherapy for pancreatic cancer. History of chronic myelocytic leukemia. Previous smoker.  EXAM: PORTABLE CHEST - 1 VIEW  COMPARISON:  08/13/2014  FINDINGS: Left central venous catheter is unchanged in position. Shallow inspiration. Mild increased heart size and pulmonary vascularity. No focal consolidation or edema. No blunting of costophrenic angles. No pneumothorax. Calcified aorta appear  IMPRESSION: Shallow inspiration. Mild cardiac enlargement and pulmonary vascular congestion. No edema or consolidation.   Electronically Signed   By: Lucienne Capers M.D.   On: 12/26/2014 18:17   Dg Abd Portable 2v  01/13/2015   CLINICAL DATA:  Nausea and vomiting.  EXAM: PORTABLE ABDOMEN - 2 VIEW  COMPARISON:  CT 12/26/2014  FINDINGS: No intraperitoneal free air on the decubitus view. No dilated loops of large or small bowel. Wall stent noted in the common bile duct. Stool in the rectum.  IMPRESSION: No evidence of bowel obstruction or free air. Biliary stent appears in good position.   Electronically Signed   By: Suzy Bouchard M.D.   On: 01/13/2015 09:35     Microbiology: Recent Results (from the past 240 hour(s))  Blood culture (routine x 2)     Status: None   Collection Time: 01/08/15  9:30 PM  Result Value Ref Range Status   Specimen Description BLOOD LEFT ANTECUBITAL  Final   Special Requests BOTTLES DRAWN AEROBIC AND ANAEROBIC 5CC  Final   Culture   Final    NO GROWTH 5 DAYS Performed at [facility]    Report Status 01/15/2015 FINAL  Final  Blood culture (routine x 2)     Status: None   Collection Time: 01/08/15  9:58 PM  Result Value Ref Range Status   Specimen Description BLOOD LEFT CHEST  Final   Special Requests BOTTLES DRAWN AEROBIC AND ANAEROBIC 5CC  Final   Culture   Final    NO GROWTH 5 DAYS Performed at [facility]    Report Status 01/15/2015 FINAL  Final  Blood Culture (routine x 2)     Status:  None (Preliminary result)   Collection Time: 01/12/15 11:11 PM  Result Value Ref Range Status   Specimen Description BLOOD LEFT CHEST  Final  Special Requests BOTTLES DRAWN AEROBIC AND ANAEROBIC 6ML  Final   Culture   Final           BLOOD CULTURE RECEIVED NO GROWTH TO DATE CULTURE WILL BE HELD FOR 5 DAYS BEFORE ISSUING A FINAL NEGATIVE REPORT Performed at [facility]    Report Status PENDING  Incomplete  Blood Culture (routine x 2)     Status: None (Preliminary result)   Collection Time: 01/12/15 11:22 PM  Result Value Ref Range Status   Specimen Description BLOOD RIGHT ANTECUBITAL  Final   Special Requests BOTTLES DRAWN AEROBIC AND ANAEROBIC 5ML  Final   Culture   Final           BLOOD CULTURE RECEIVED NO GROWTH TO DATE CULTURE WILL BE HELD FOR 5 DAYS BEFORE ISSUING A FINAL NEGATIVE REPORT Performed at [facility]    Report Status PENDING  Incomplete  Urine culture     Status: None   Collection Time: 01/13/15  1:28 AM  Result Value Ref Range Status   Specimen Description URINE, CLEAN CATCH  Final   Special Requests NONE  Final   Colony Count   Final    4,000 COLONIES/ML Performed at [facility]    Culture   Final    INSIGNIFICANT GROWTH Performed at [facility]    Report Status 01/14/2015 FINAL  Final     Labs: Basic Metabolic Panel:  Recent Labs Lab 01/12/15 2311 01/13/15 0450 01/14/15 0450 01/15/15 0600 01/16/15 0430  NA 135 133* 139 141 140  K 3.5 3.3* 3.3* 3.2* 3.4*  CL 98 99 104 108 109  CO2 27 26 28 27 26   GLUCOSE 140* 117* 115* 124* 115*  BUN 14 12 9 6  <5*  CREATININE 0.84 0.77 0.76 0.86 0.95  CALCIUM 9.0 8.0* 8.2* 8.5 8.5  MG  --   --  1.3*  --  1.6   Liver Function Tests:  Recent Labs Lab 01/11/15 1304 01/12/15 2311 01/13/15 0450 01/14/15 0450  AST 37* 30 28 26   ALT 18 22 21 19   ALKPHOS 290* 319* 305* 327*  BILITOT 1.57* 1.5* 1.2 0.8  PROT 6.2* 6.1 5.8* 5.7*  ALBUMIN 2.8* 2.8* 2.7* 2.5*    Recent  Labs Lab 01/13/15  LIPASE 13   No results for input(s): AMMONIA in the last 168 hours. CBC:  Recent Labs Lab 01/11/15 1304 01/12/15 2311 01/13/15 0450 01/14/15 0450 01/15/15 0600  WBC 7.8 5.0 6.5 6.8 5.3  NEUTROABS 7.4* 4.1 5.4  --   --   HGB 10.7* 12.1* 9.6* 9.4* 9.1*  HCT 33.0* 37.5* 29.0* 28.4* 27.8*  MCV 99.3* 100.8* 100.7* 100.4* 101.5*  PLT 234 150 167 149* 134*   Cardiac Enzymes: No results for input(s): CKTOTAL, CKMB, CKMBINDEX, TROPONINI in the last 168 hours. BNP: Invalid input(s): POCBNP CBG:  Recent Labs Lab 01/15/15 0550 01/15/15 1140 01/15/15 1703 01/15/15 2326 01/16/15 0645  GLUCAP 108* 135* 194* 172* 118*    Time coordinating discharge:  Greater than 30 minutes  Signed:  Macklin Jacquin, DO [facility] Pager: [phone] 01/16/2015, 8:48 AM

## 2015-01-16 NOTE — Progress Notes (Signed)
Evan [name]   DOB:05/28/1941   IO#:[id number]   LKG#:[id number]  Patient Care Team: [name], MD as PCP - General (Internal Medicine) [name], MD as Referring Physician (Specialist) Provider Not In System [name], MD as Consulting Physician (Hematology and Oncology) I have seen the patient, examined him and edited the notes as follows  Subjective: Patient seen and examined. He is feeling well this morning. Denies fevers, chills, night sweats, vision changes, or mucositis. Denies any respiratory complaints. Denies any chest pain or palpitations. Denies lower extremity swelling. Reports less nausea. Denies vomiting. His abdominal pain is better controlled. No constipation, last bowel movement on 3/1. Appetite improving. Denies any dysuria. Denies abnormal skin rashes, or neuropathy. Denies any bleeding issues such as epistaxis, hematemesis, hematuria or hematochezia. Tolerating physical therapy session.  Scheduled Meds: . antiseptic oral rinse  7 mL Mouth Rinse BID  . aspirin EC  81 mg Oral Daily  . azithromycin  500 mg Oral Daily  . cefpodoxime  200 mg Oral Q12H  . heparin  5,000 Units Subcutaneous 3 times per day  . insulin aspart  0-15 Units Subcutaneous Q6H  . losartan  50 mg Oral Daily  . pantoprazole  40 mg Oral BID AC   Continuous Infusions: . sodium chloride 50 mL/hr at 01/15/15 2247   PRN Meds:acetaminophen, hydrALAZINE, ondansetron (ZOFRAN) IV, sodium chloride   Objective:  Filed Vitals:   01/16/15 0510  BP: 146/70  Pulse: 74  Temp: 98.1 F (36.7 C)  Resp: 20      Intake/Output Summary (Last 24 hours) at 01/16/15 0725 Last data filed at 01/16/15 0700  Gross per 24 hour  Intake   1510 ml  Output   1750 ml  Net   -240 ml    ECOG PERFORMANCE STATUS:2   GENERAL:alert, no distress and comfortable SKIN: skin color, texture, turgor are normal, right leg cellulitis improved EYES: normal, conjunctiva are pink and non-injected, sclera  clear OROPHARYNX:no exudate, no erythema and lips, buccal mucosa, and tongue normal  NECK: supple, thyroid normal size, non-tender, without nodularity LYMPH:  no palpable lymphadenopathy in the cervical, axillary or inguinal LUNGS: clear to auscultation and percussion with normal breathing effort HEART: regular rate & rhythm and no murmurs and no lower extremity edema ABDOMEN: soft, non-tender and normal bowel sounds Musculoskeletal:no cyanosis of digits and no clubbing  PSYCH: alert & oriented x 3 with fluent speech NEURO: no focal motor/sensory deficits    CBG (last 3)   Recent Labs  01/15/15 1703 01/15/15 2326 01/16/15 0645  GLUCAP 194* 172* 118*     Labs:   Recent Labs Lab 01/11/15 1304 01/12/15 2311 01/13/15 0450 01/14/15 0450 01/15/15 0600  WBC 7.8 5.0 6.5 6.8 5.3  HGB 10.7* 12.1* 9.6* 9.4* 9.1*  HCT 33.0* 37.5* 29.0* 28.4* 27.8*  PLT 234 150 167 149* 134*  MCV 99.3* 100.8* 100.7* 100.4* 101.5*  MCH 32.3 32.5 33.3 33.2 33.2  MCHC 32.5 32.3 33.1 33.1 32.7  RDW 20.0* 18.5* 18.4* 18.1* 18.6*  LYMPHSABS 0.4* 0.4* 0.3*  --   --   MONOABS 0.1 0.5 0.7  --   --   EOSABS 0.0 0.0 0.0  --   --   BASOSABS 0.0 0.0 0.0  --   --      Chemistries:    Recent Labs Lab 01/11/15 1304 01/12/15 2311 01/13/15 0450 01/14/15 0450 01/15/15 0600 01/16/15 0430  NA 141 135 133* 139 141 140  K 4.0 3.5 3.3* 3.3* 3.2* 3.4*  CL  --  98 99 104 108 109  CO2 24 27 26 28 27 26   GLUCOSE 151* 140* 117* 115* 124* 115*  BUN 12.1 14 12 9 6  <5*  CREATININE 0.8 0.84 0.77 0.76 0.86 0.95  CALCIUM 9.9 9.0 8.0* 8.2* 8.5 8.5  MG  --   --   --  1.3*  --  1.6  AST 37* 30 28 26   --   --   ALT 18 22 21 19   --   --   ALKPHOS 290* 319* 305* 327*  --   --   BILITOT 1.57* 1.5* 1.2 0.8  --   --     GFR Estimated Creatinine Clearance: 89.8 mL/min (by C-G formula based on Cr of 0.95).  Liver Function Tests:  Recent Labs Lab 01/11/15 1304 01/12/15 2311 01/13/15 0450 01/14/15 0450  AST  37* 30 28 26   ALT 18 22 21 19   ALKPHOS 290* 319* 305* 327*  BILITOT 1.57* 1.5* 1.2 0.8  PROT 6.2* 6.1 5.8* 5.7*  ALBUMIN 2.8* 2.8* 2.7* 2.5*    Recent Labs Lab 01/13/15  LIPASE 13   No results for input(s): AMMONIA in the last 168 hours.  Urine Studies     Component Value Date/Time   COLORURINE AMBER* 01/13/2015 0128   APPEARANCEUR CLEAR 01/13/2015 0128   LABSPEC 1.022 01/13/2015 0128   PHURINE 6.0 01/13/2015 0128   GLUCOSEU NEGATIVE 01/13/2015 0128   HGBUR NEGATIVE 01/13/2015 0128   BILIRUBINUR SMALL* 01/13/2015 0128   KETONESUR 15* 01/13/2015 0128   PROTEINUR 30* 01/13/2015 0128   UROBILINOGEN 2.0* 01/13/2015 0128   NITRITE NEGATIVE 01/13/2015 0128   LEUKOCYTESUR NEGATIVE 01/13/2015 0128   CBG:  Recent Labs Lab 01/15/15 0550 01/15/15 1140 01/15/15 1703 01/15/15 2326 01/16/15 0645  GLUCAP 108* 135* 194* 172* 118*   Microbiology Cultures pending   Imaging Studies:  CXR showed improved lung opacification.  Assessment/Plan: 74 y.o.  Malignant neoplasm of head of pancreas He is s/p cycle 2, day 1 on 01/07/15 of dose further reduced chemotherapy with Abraxane -20% (due to recent hospitalization for pneumonia), along with reduced dose Gemzar, today Day 9. He received Decadron on 2/22 and then oral prednisone last G-CSF support received on 2/10. Chemo treatment scheduled for 2/29 was canceled due to current hospitalization Plan is to give him minimum 3 cycles of treatment before repeating imaging study. Continue supportive care  CML (chronic myeloid leukemia) Currently on remission LastDasatinib taken on 2/29, resume as out-patient  Recent HCPA ((healthcare-associated pneumonia) On broad spectrum antibiotics Cultures are pending Appreciate primary team involvement.  Repeat CXR per patient's wife request is to be performed today, improved  Nausea, vomiting, abdominal pain Likely secondary to recent chemotherapy Abdominal x ray was negative for acute  findings Symptoms are improved Continue supportive care  Anemia in neoplastic disease This is likely due to recent treatment, infection, dilution The patient denies recent history of bleeding such as epistaxis, hematuria or hematochezia.  [name] Will observe. He does not require transfusion now.  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 He will continue dose adjusted treatment with abraxane  Diabetes mellitus with neuropathy His blood sugar appears to be reasonably controlled. We will continue to monitor to closely while he is on treatment.  Right lower extremity edema & cellulitis This is due to cellulitis treated with Keflex, now improved. Doppler ultrasound on 2/26 was negative for DVT  Protein Calorie Malnutrition Improving. Appreciate Nutrition evaluation  Deconditioning, improving Appreciate PT/OT involvement  Full Code  Discharge planning Up until the day of admission, PS score was very poor, borderline ECOG 2-3. Improved today. Appreciate PT consult. Probably DC today Other medical issues as per admitting team    **Disclaimer: This note was dictated with voice recognition software. Similar sounding words can inadvertently be transcribed and this note may contain transcription errors which may not have been corrected upon publication of note.Sharene Butters E, PA-C 01/16/2015  7:25 AM Jarrod Mcenery, MD 01/16/2015

## 2015-01-16 NOTE — Progress Notes (Signed)
Porta cath deaccessed. Flushed with 10cc NS followed by Heparin 5ml (100u/ml). No bleeding to site, band aid to site for comfort. Gunter Conde M 

## 2015-01-19 LAB — CULTURE, BLOOD (ROUTINE X 2)
Culture: NO GROWTH
Culture: NO GROWTH

## 2015-01-23 ENCOUNTER — Telehealth: Payer: Self-pay | Admitting: *Deleted

## 2015-01-23 ENCOUNTER — Ambulatory Visit (HOSPITAL_BASED_OUTPATIENT_CLINIC_OR_DEPARTMENT_OTHER): Payer: Medicare Other | Admitting: Hematology and Oncology

## 2015-01-23 ENCOUNTER — Other Ambulatory Visit (HOSPITAL_BASED_OUTPATIENT_CLINIC_OR_DEPARTMENT_OTHER): Payer: Medicare Other

## 2015-01-23 ENCOUNTER — Encounter: Payer: Self-pay | Admitting: Hematology and Oncology

## 2015-01-23 VITALS — BP 153/69 | HR 82 | Temp 98.5°F | Resp 18 | Ht 74.0 in | Wt 220.5 lb

## 2015-01-23 DIAGNOSIS — R748 Abnormal levels of other serum enzymes: Secondary | ICD-10-CM

## 2015-01-23 DIAGNOSIS — C250 Malignant neoplasm of head of pancreas: Secondary | ICD-10-CM

## 2015-01-23 DIAGNOSIS — E1140 Type 2 diabetes mellitus with diabetic neuropathy, unspecified: Secondary | ICD-10-CM

## 2015-01-23 DIAGNOSIS — D630 Anemia in neoplastic disease: Secondary | ICD-10-CM

## 2015-01-23 LAB — COMPREHENSIVE METABOLIC PANEL (CC13)
ALBUMIN: 3.2 g/dL — AB (ref 3.5–5.0)
ALT: 19 U/L (ref 0–55)
AST: 37 U/L — AB (ref 5–34)
Alkaline Phosphatase: 319 U/L — ABNORMAL HIGH (ref 40–150)
Anion Gap: 12 mEq/L — ABNORMAL HIGH (ref 3–11)
BILIRUBIN TOTAL: 0.54 mg/dL (ref 0.20–1.20)
BUN: 19.1 mg/dL (ref 7.0–26.0)
CO2: 25 meq/L (ref 22–29)
CREATININE: 1.1 mg/dL (ref 0.7–1.3)
Calcium: 10.2 mg/dL (ref 8.4–10.4)
Chloride: 106 mEq/L (ref 98–109)
EGFR: 71 mL/min/{1.73_m2} — ABNORMAL LOW (ref >90)
GLUCOSE: 93 mg/dL (ref 70–140)
Potassium: 4.5 mEq/L (ref 3.5–5.1)
Sodium: 142 mEq/L (ref 136–145)
Total Protein: 6.8 g/dL (ref 6.4–8.3)

## 2015-01-23 LAB — CBC WITH DIFFERENTIAL/PLATELET
BASO%: 0.8 % (ref 0.0–2.0)
Basophils Absolute: 0.1 10*3/uL (ref 0.0–0.1)
EOS ABS: 0.1 10*3/uL (ref 0.0–0.5)
EOS%: 1.6 % (ref 0.0–7.0)
HEMATOCRIT: 35.9 % — AB (ref 38.4–49.9)
HEMOGLOBIN: 11.4 g/dL — AB (ref 13.0–17.1)
LYMPH#: 1.1 10*3/uL (ref 0.9–3.3)
LYMPH%: 15.4 % (ref 14.0–49.0)
MCH: 33 pg (ref 27.2–33.4)
MCHC: 31.8 g/dL — AB (ref 32.0–36.0)
MCV: 104.1 fL — AB (ref 79.3–98.0)
MONO#: 1 10*3/uL — AB (ref 0.1–0.9)
MONO%: 14.1 % — ABNORMAL HIGH (ref 0.0–14.0)
NEUT#: 5 10*3/uL (ref 1.5–6.5)
NEUT%: 68.1 % (ref 39.0–75.0)
Platelets: 316 10*3/uL (ref 140–400)
RBC: 3.45 10*6/uL — ABNORMAL LOW (ref 4.20–5.82)
RDW: 18.4 % — AB (ref 11.0–14.6)
WBC: 7.4 10*3/uL (ref 4.0–10.3)
nRBC: 0 % (ref 0–0)

## 2015-01-23 NOTE — Progress Notes (Signed)
[place] OFFICE PROGRESS NOTE  Patient Care Team: Lavone Orn, MD as PCP - General (Internal Medicine) [name], MD as Referring Physician (Specialist) Provider Not In System [name], MD as Consulting Physician (Hematology and Oncology)  SUMMARY OF ONCOLOGIC HISTORY: Oncology History   Pancreatic cancer   Primary site: Pancreas   Staging method: AJCC 7th Edition   Clinical: Stage IIA (T3, N0, M0) signed by [name], MD on 06/13/2014  4:37 PM   Summary: Stage IIA (T3, N0, M0)       Malignant neoplasm of head of pancreas   06/05/2014 Imaging  MRI abdomen showed 2.2 cm lesion in the pancreatic head obstructing the common bile duct and the pancreatic duct is highly concerning for pancreatic adenocarcinoma.  There is moderate intra and extrahepatic biliary duct dilatation.    06/05/2014 Tumor Marker CA 19-9 is elevated at 1234   06/06/2014 Imaging Ct scan of chest showed 2 lung nodules, likely benign   06/07/2014 Procedure He had ERCP and stent placement   06/07/2014 Procedure He had endoscopic ultrasound with  fine needle aspiration.   06/07/2014 Pathology Results Accession: [id number] FNA is positive for pancreatic adenoca   06/14/2014 Surgery He has port placement   06/25/2014 - 08/01/2014 Chemotherapy He received neoadjuvant chemotherapy with Gemzar   06/25/2014 - 08/06/2014 Radiation Therapy he received neoadjuvant radiation: 50.4 Gy   08/13/2014 - 08/23/2014 Hospital Admission The patient was admitted to the hospital related to uncontrolled nausea, vomiting and severe epigastric pain and was found to have significant gastric ulcer, resolved with conservative management and total parenteral nutrition   09/06/2014 Imaging Repeat CT scan show enlarging lung nodule, suspicious for early metastatic disease.   10/18/2014 Imaging CT scan of the chest, abdomen and pelvis show metastatic disease in the lung and liver.   10/18/2014 Tumor Marker CA-19-9 at 1745   10/22/2014 - 12/03/2014  Chemotherapy The patient is placed on modified FOLFOX chemotherapy   11/05/2014 Tumor Marker Ca19-9 at 2447   12/14/2014 Imaging CT scan showed disease progression in the liver   12/14/2014 Tumor Marker CA 19-9 at 2050   12/17/2014 -  Chemotherapy the patient was starting on Abraxane with reduced dose Gemzar   12/24/2014 Tumor Marker CA-19-9 is at 1387.   12/26/2014 - 12/29/2014 Hospital Admission the patient was admitted to the hospital for hospital-acquired pneumonia.   01/07/2015 Adverse Reaction cycle 2 of treatment with reduced dose Abraxane due to recent hospitalization   01/12/2015 - 01/16/2015 Hospital Admission  the patient was admitted to the hospital again for hospital acquired pneumonia.    INTERVAL HISTORY: Please see below for problem oriented charting. He is seen again today prior to next treatment. He was again hospitalized recently for pneumonia. Overall, all his symptoms had improved. He denies further fevers or chills. No recent cough. Denies weakness. He has persistent neuropathy, unchanged.  REVIEW OF SYSTEMS:   Constitutional: Denies fevers, chills or abnormal weight loss Eyes: Denies blurriness of vision Ears, nose, mouth, throat, and face: Denies mucositis or sore throat Respiratory: Denies cough, dyspnea or wheezes Cardiovascular: Denies palpitation, chest discomfort or lower extremity swelling Gastrointestinal:  Denies nausea, heartburn or change in bowel habits Skin: Denies abnormal skin rashes Lymphatics: Denies new lymphadenopathy or easy bruising Neurological:Denies numbness, tingling or new weaknesses Behavioral/Psych: Mood is stable, no new changes  All other systems were reviewed with the patient and are negative.  I have reviewed the past medical history, past surgical history, social history and family history  with the patient and they are unchanged from previous note.  ALLERGIES:  is allergic to ace inhibitors and ciprofloxacin.  MEDICATIONS:  Current  Outpatient Prescriptions  Medication Sig Dispense Refill  . acetaminophen (TYLENOL) 500 MG tablet Take 1,000 mg by mouth every 6 (six) hours as needed for fever.    Marland Kitchen aspirin EC 81 MG tablet Take 81 mg by mouth daily.    . B Complex-C (SUPER B COMPLEX/VITAMIN C PO) Take 1 tablet by mouth daily.     . cholecalciferol (VITAMIN D) 1000 UNITS tablet Take 1,000 Units by mouth daily.    . dasatinib (SPRYCEL) 50 MG tablet Take 50 mg by mouth daily.     . insulin glargine (LANTUS) 100 UNIT/ML injection Inject 26 Units into the skin at bedtime.     Marland Kitchen lactulose (CHRONULAC) 10 GM/15ML solution Take 10 g by mouth 2 (two) times daily as needed for moderate constipation.     Marland Kitchen losartan (COZAAR) 50 MG tablet Take 50 mg by mouth daily.  11  . metFORMIN (GLUCOPHAGE) 1000 MG tablet Take 1,000 mg by mouth 2 (two) times daily with a meal.    . pantoprazole (PROTONIX) 40 MG tablet Take 1 tablet (40 mg total) by mouth daily. 60 tablet 6  . PRESCRIPTION MEDICATION Chemo at [facility] 01/07/15.    Marland Kitchen promethazine (PHENERGAN) 25 MG tablet Take 25 mg by mouth every 6 (six) hours as needed for nausea or vomiting (nausea).    . sennosides-docusate sodium (SENOKOT-S) 8.6-50 MG tablet Take 2 tablets by mouth at bedtime.     . simvastatin (ZOCOR) 20 MG tablet Take 20 mg by mouth daily.     . vitamin B-12 (CYANOCOBALAMIN) 1000 MCG tablet Take 1,000 mcg by mouth daily.     No current facility-administered medications for this visit.   Facility-Administered Medications Ordered in Other Visits  Medication Dose Route Frequency Provider Last Rate Last Dose  . heparin lock flush 100 unit/mL  500 Units Intravenous Once [name], MD      . sodium chloride 0.9 % injection 10 mL  10 mL Intravenous PRN [name], MD        PHYSICAL EXAMINATION: ECOG PERFORMANCE STATUS: 1 - Symptomatic but completely ambulatory  Filed Vitals:   01/23/15 1042  BP: 153/69  Pulse: 82  Temp: 98.5 F (36.9 C)  Resp: 18   Filed Weights   01/23/15  1042  Weight: 220 lb 8 oz (100.018 kg)    GENERAL:alert, no distress and comfortable SKIN: skin color, texture, turgor are normal, no rashes or significant lesions EYES: normal, Conjunctiva are pink and non-injected, sclera clear OROPHARYNX:no exudate, no erythema and lips, buccal mucosa, and tongue normal  NECK: supple, thyroid normal size, non-tender, without nodularity LYMPH:  no palpable lymphadenopathy in the cervical, axillary or inguinal LUNGS: clear to auscultation and percussion with normal breathing effort HEART: regular rate & rhythm and no murmurs and no lower extremity edema ABDOMEN:abdomen soft, non-tender and normal bowel sounds Musculoskeletal:no cyanosis of digits and no clubbing  NEURO: alert & oriented x 3 with fluent speech, no focal motor/sensory deficits  LABORATORY DATA:  I have reviewed the data as listed    Component Value Date/Time   NA 142 01/23/2015 1027   NA 140 01/16/2015 0430   K 4.5 01/23/2015 1027   K 3.4* 01/16/2015 0430   CL 109 01/16/2015 0430   CL 107 04/27/2013 0926   CO2 25 01/23/2015 1027   CO2 26 01/16/2015 0430  GLUCOSE 93 01/23/2015 1027   GLUCOSE 115* 01/16/2015 0430   GLUCOSE 212* 04/27/2013 0926   BUN 19.1 01/23/2015 1027   BUN <5* 01/16/2015 0430   CREATININE 1.1 01/23/2015 1027   CREATININE 0.95 01/16/2015 0430   CALCIUM 10.2 01/23/2015 1027   CALCIUM 8.5 01/16/2015 0430   PROT 6.8 01/23/2015 1027   PROT 5.7* 01/14/2015 0450   ALBUMIN 3.2* 01/23/2015 1027   ALBUMIN 2.5* 01/14/2015 0450   AST 37* 01/23/2015 1027   AST 26 01/14/2015 0450   ALT 19 01/23/2015 1027   ALT 19 01/14/2015 0450   ALKPHOS 319* 01/23/2015 1027   ALKPHOS 327* 01/14/2015 0450   BILITOT 0.54 01/23/2015 1027   BILITOT 0.8 01/14/2015 0450   GFRNONAA 81* 01/16/2015 0430   GFRAA >90 01/16/2015 0430    No results found for: SPEP, UPEP  Lab Results  Component Value Date   WBC 7.4 01/23/2015   NEUTROABS 5.0 01/23/2015   HGB 11.4* 01/23/2015   HCT  35.9* 01/23/2015   MCV 104.1* 01/23/2015   PLT 316 01/23/2015      Chemistry      Component Value Date/Time   NA 142 01/23/2015 1027   NA 140 01/16/2015 0430   K 4.5 01/23/2015 1027   K 3.4* 01/16/2015 0430   CL 109 01/16/2015 0430   CL 107 04/27/2013 0926   CO2 25 01/23/2015 1027   CO2 26 01/16/2015 0430   BUN 19.1 01/23/2015 1027   BUN <5* 01/16/2015 0430   CREATININE 1.1 01/23/2015 1027   CREATININE 0.95 01/16/2015 0430      Component Value Date/Time   CALCIUM 10.2 01/23/2015 1027   CALCIUM 8.5 01/16/2015 0430   ALKPHOS 319* 01/23/2015 1027   ALKPHOS 327* 01/14/2015 0450   AST 37* 01/23/2015 1027   AST 26 01/14/2015 0450   ALT 19 01/23/2015 1027   ALT 19 01/14/2015 0450   BILITOT 0.54 01/23/2015 1027   BILITOT 0.8 01/14/2015 0450      ASSESSMENT & PLAN:  Malignant neoplasm of head of pancreas His recent treatment was complicated by hospitalization with pneumonia. I plan to further reduce the dose of chemotherapy with Abraxane 20% along with reduced dose Gemzar by 50%. He will continue G-CSF support with each cycle. I plan to give him minimum 3 cycles of treatment before repeating imaging study.   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 The patient denies recent history of bleeding such as epistaxis, hematuria or hematochezia. [name] I will observe for now.  He does not require transfusion now.   Elevated liver enzymes This is likely related to liver metastasis and fatty liver disease. We will continue with dose adjustment as above.   Type 2 diabetes mellitus with diabetic neuropathy His blood sugar appears to be reasonably controlled. We will continue to monitor to closely while he is on treatment.    No orders of the defined types were placed in this encounter.   All questions were answered. The patient knows to call the clinic with any problems, questions or concerns. No barriers to learning was  detected. I spent 30 minutes counseling the patient face to face. The total time spent in the appointment was 40 minutes and more than 50% was on counseling and review of test results     [facility], [place], MD 01/23/2015 3:12 PM

## 2015-01-23 NOTE — Assessment & Plan Note (Signed)
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 We will continue with dose adjustment as above.

## 2015-01-23 NOTE — Telephone Encounter (Signed)
-----   Message from [name], MD sent at 01/23/2015 12:00 PM EST ----- Regarding: LFT Pls call wife with results. Most of the liver function is better except for alkaline phos. I recommend he keeps his lab appt on Monday ----- Message -----    From: [facility]    Sent: 01/23/2015  10:40 AM      To: [name], MD

## 2015-01-23 NOTE — Assessment & Plan Note (Signed)
This is likely due to recent treatment. The patient denies recent history of bleeding such as epistaxis, hematuria or hematochezia. [name] I will observe for now.  He does not require transfusion now. 

## 2015-01-23 NOTE — Assessment & Plan Note (Signed)
His recent treatment was complicated by hospitalization with pneumonia. I plan to further reduce the dose of chemotherapy with Abraxane 20% along with reduced dose Gemzar by 50%. He will continue G-CSF support with each cycle. I plan to give him minimum 3 cycles of treatment before repeating imaging study.

## 2015-01-23 NOTE — Telephone Encounter (Signed)
Pt answered phone and I informed him of Liver function improving but still a little elevated.  We need to recheck it on Monday.  Instructed him to keep his appts on Monday as scheduled and for his wife to call us back if she has any questions.  He verbalized understanding.

## 2015-01-23 NOTE — Assessment & Plan Note (Signed)
His blood sugar appears to be reasonably controlled. We will continue to monitor to closely while he is on treatment.

## 2015-01-24 LAB — CANCER ANTIGEN 19-9: CA 19-9: 491.7 U/mL — ABNORMAL HIGH (ref <35.0)

## 2015-01-25 ENCOUNTER — Other Ambulatory Visit: Payer: Self-pay | Admitting: *Deleted

## 2015-01-25 MED ORDER — DASATINIB 50 MG PO TABS: 50.0000 mg | ORAL_TABLET | Freq: Every day | ORAL | Status: DC

## 2015-01-25 NOTE — Telephone Encounter (Signed)
Rx for Sprycel faxed to [facility] fax [phone].  Notified wife of refill being sent to pharmacy.

## 2015-01-28 ENCOUNTER — Ambulatory Visit: Payer: Medicare Other

## 2015-01-28 ENCOUNTER — Other Ambulatory Visit: Payer: Self-pay | Admitting: Hematology and Oncology

## 2015-01-28 ENCOUNTER — Telehealth: Payer: Self-pay | Admitting: Hematology and Oncology

## 2015-01-28 ENCOUNTER — Ambulatory Visit: Payer: Medicare Other | Admitting: Hematology and Oncology

## 2015-01-28 ENCOUNTER — Ambulatory Visit (HOSPITAL_BASED_OUTPATIENT_CLINIC_OR_DEPARTMENT_OTHER): Payer: Medicare Other

## 2015-01-28 ENCOUNTER — Other Ambulatory Visit (HOSPITAL_BASED_OUTPATIENT_CLINIC_OR_DEPARTMENT_OTHER): Payer: Medicare Other

## 2015-01-28 DIAGNOSIS — C250 Malignant neoplasm of head of pancreas: Secondary | ICD-10-CM | POA: Diagnosis not present

## 2015-01-28 DIAGNOSIS — Z95828 Presence of other vascular implants and grafts: Secondary | ICD-10-CM

## 2015-01-28 DIAGNOSIS — Z5111 Encounter for antineoplastic chemotherapy: Secondary | ICD-10-CM

## 2015-01-28 DIAGNOSIS — D630 Anemia in neoplastic disease: Secondary | ICD-10-CM | POA: Diagnosis not present

## 2015-01-28 DIAGNOSIS — R748 Abnormal levels of other serum enzymes: Secondary | ICD-10-CM

## 2015-01-28 LAB — CBC WITH DIFFERENTIAL/PLATELET
BASO%: 0.6 % (ref 0.0–2.0)
Basophils Absolute: 0 10*3/uL (ref 0.0–0.1)
EOS ABS: 0.2 10*3/uL (ref 0.0–0.5)
EOS%: 3.2 % (ref 0.0–7.0)
HCT: 34.6 % — ABNORMAL LOW (ref 38.4–49.9)
HGB: 11 g/dL — ABNORMAL LOW (ref 13.0–17.1)
LYMPH#: 0.8 10*3/uL — AB (ref 0.9–3.3)
LYMPH%: 15.9 % (ref 14.0–49.0)
MCH: 32.5 pg (ref 27.2–33.4)
MCHC: 31.9 g/dL — ABNORMAL LOW (ref 32.0–36.0)
MCV: 102 fL — ABNORMAL HIGH (ref 79.3–98.0)
MONO#: 0.5 10*3/uL (ref 0.1–0.9)
MONO%: 11.2 % (ref 0.0–14.0)
NEUT#: 3.3 10*3/uL (ref 1.5–6.5)
NEUT%: 69.1 % (ref 39.0–75.0)
Platelets: 300 10*3/uL (ref 140–400)
RBC: 3.39 10*6/uL — ABNORMAL LOW (ref 4.20–5.82)
RDW: 17.7 % — AB (ref 11.0–14.6)
WBC: 4.8 10*3/uL (ref 4.0–10.3)

## 2015-01-28 LAB — COMPREHENSIVE METABOLIC PANEL (CC13)
ALT: 24 U/L (ref 0–55)
ANION GAP: 11 meq/L (ref 3–11)
AST: 34 U/L (ref 5–34)
Albumin: 3.2 g/dL — ABNORMAL LOW (ref 3.5–5.0)
Alkaline Phosphatase: 221 U/L — ABNORMAL HIGH (ref 40–150)
BILIRUBIN TOTAL: 0.57 mg/dL (ref 0.20–1.20)
BUN: 13.5 mg/dL (ref 7.0–26.0)
CO2: 25 mEq/L (ref 22–29)
Calcium: 9.9 mg/dL (ref 8.4–10.4)
Chloride: 107 mEq/L (ref 98–109)
Creatinine: 1 mg/dL (ref 0.7–1.3)
EGFR: 80 mL/min/{1.73_m2} — AB (ref >90)
GLUCOSE: 163 mg/dL — AB (ref 70–140)
Potassium: 3.8 mEq/L (ref 3.5–5.1)
SODIUM: 143 meq/L (ref 136–145)
Total Protein: 6.5 g/dL (ref 6.4–8.3)

## 2015-01-28 LAB — CANCER ANTIGEN 19-9: CA 19 9: 502 U/mL — AB (ref <35.0)

## 2015-01-28 MED ORDER — PACLITAXEL PROTEIN-BOUND CHEMO INJECTION 100 MG
80.0000 mg/m2 | Freq: Once | INTRAVENOUS | Status: AC
  Administered 2015-01-28: 175 mg via INTRAVENOUS
  Filled 2015-01-28: qty 35

## 2015-01-28 MED ORDER — HEPARIN SOD (PORK) LOCK FLUSH 100 UNIT/ML IV SOLN
500.0000 [IU] | Freq: Once | INTRAVENOUS | Status: AC | PRN
  Administered 2015-01-28: 500 [IU]
  Filled 2015-01-28: qty 5

## 2015-01-28 MED ORDER — SODIUM CHLORIDE 0.9 % IJ SOLN
10.0000 mL | INTRAMUSCULAR | Status: DC | PRN
  Administered 2015-01-28: 10 mL via INTRAVENOUS
  Filled 2015-01-28: qty 10

## 2015-01-28 MED ORDER — SODIUM CHLORIDE 0.9 % IV SOLN
Freq: Once | INTRAVENOUS | Status: AC
  Administered 2015-01-28: 12:00:00 via INTRAVENOUS

## 2015-01-28 MED ORDER — DEXAMETHASONE SODIUM PHOSPHATE 100 MG/10ML IJ SOLN
Freq: Once | INTRAMUSCULAR | Status: AC
  Administered 2015-01-28: 12:00:00 via INTRAVENOUS
  Filled 2015-01-28: qty 4

## 2015-01-28 MED ORDER — SODIUM CHLORIDE 0.9 % IV SOLN
500.0000 mg/m2 | Freq: Once | INTRAVENOUS | Status: AC
  Administered 2015-01-28: 1140 mg via INTRAVENOUS
  Filled 2015-01-28: qty 29.98

## 2015-01-28 MED ORDER — SODIUM CHLORIDE 0.9 % IJ SOLN
10.0000 mL | INTRAMUSCULAR | Status: DC | PRN
  Administered 2015-01-28: 10 mL
  Filled 2015-01-28: qty 10

## 2015-01-28 NOTE — Telephone Encounter (Signed)
per MD cx todays visit and Move to 3.22....gv and printed pt sched

## 2015-01-28 NOTE — Patient Instructions (Signed)
[facility] Discharge Instructions for Patients Receiving Chemotherapy  Today you received the following chemotherapy agents; Gemzar and Abraxene.   To help prevent nausea and vomiting after your treatment, we encourage you to take your nausea medication as directed.    If you develop nausea and vomiting that is not controlled by your nausea medication, call the clinic.   BELOW ARE SYMPTOMS THAT SHOULD BE REPORTED IMMEDIATELY:  *FEVER GREATER THAN 100.5 F  *CHILLS WITH OR WITHOUT FEVER  NAUSEA AND VOMITING THAT IS NOT CONTROLLED WITH YOUR NAUSEA MEDICATION  *UNUSUAL SHORTNESS OF BREATH  *UNUSUAL BRUISING OR BLEEDING  TENDERNESS IN MOUTH AND THROAT WITH OR WITHOUT PRESENCE OF ULCERS  *URINARY PROBLEMS  *BOWEL PROBLEMS  UNUSUAL RASH Items with * indicate a potential emergency and should be followed up as soon as possible.  Feel free to call the clinic you have any questions or concerns. The clinic phone number is [phone].

## 2015-01-28 NOTE — Patient Instructions (Signed)

## 2015-01-29 ENCOUNTER — Telehealth: Payer: Self-pay | Admitting: *Deleted

## 2015-01-29 NOTE — Telephone Encounter (Signed)
Received fax stating sprycel will be shipped on 01/30/15

## 2015-01-31 ENCOUNTER — Other Ambulatory Visit: Payer: Self-pay

## 2015-01-31 DIAGNOSIS — C9211 Chronic myeloid leukemia, BCR/ABL-positive, in remission: Secondary | ICD-10-CM

## 2015-01-31 MED ORDER — CEPHALEXIN 500 MG PO CAPS: 500.0000 mg | ORAL_CAPSULE | Freq: Four times a day (QID) | ORAL | Status: AC

## 2015-02-04 ENCOUNTER — Other Ambulatory Visit (HOSPITAL_BASED_OUTPATIENT_CLINIC_OR_DEPARTMENT_OTHER): Payer: Medicare Other

## 2015-02-04 ENCOUNTER — Ambulatory Visit: Payer: Medicare Other

## 2015-02-04 ENCOUNTER — Ambulatory Visit (HOSPITAL_BASED_OUTPATIENT_CLINIC_OR_DEPARTMENT_OTHER): Payer: Medicare Other

## 2015-02-04 VITALS — BP 138/65 | HR 117 | Temp 98.6°F | Resp 18

## 2015-02-04 DIAGNOSIS — Z95828 Presence of other vascular implants and grafts: Secondary | ICD-10-CM

## 2015-02-04 DIAGNOSIS — C250 Malignant neoplasm of head of pancreas: Secondary | ICD-10-CM

## 2015-02-04 DIAGNOSIS — Z5111 Encounter for antineoplastic chemotherapy: Secondary | ICD-10-CM

## 2015-02-04 LAB — CBC WITH DIFFERENTIAL/PLATELET
BASO%: 0.9 % (ref 0.0–2.0)
Basophils Absolute: 0 10*3/uL (ref 0.0–0.1)
EOS ABS: 0 10*3/uL (ref 0.0–0.5)
EOS%: 0.9 % (ref 0.0–7.0)
HCT: 34.4 % — ABNORMAL LOW (ref 38.4–49.9)
HGB: 11.2 g/dL — ABNORMAL LOW (ref 13.0–17.1)
LYMPH%: 19.3 % (ref 14.0–49.0)
MCH: 33.1 pg (ref 27.2–33.4)
MCHC: 32.6 g/dL (ref 32.0–36.0)
MCV: 101.8 fL — AB (ref 79.3–98.0)
MONO#: 0.6 10*3/uL (ref 0.1–0.9)
MONO%: 19 % — ABNORMAL HIGH (ref 0.0–14.0)
NEUT%: 59.9 % (ref 39.0–75.0)
NEUTROS ABS: 1.9 10*3/uL (ref 1.5–6.5)
Platelets: 115 10*3/uL — ABNORMAL LOW (ref 140–400)
RBC: 3.38 10*6/uL — AB (ref 4.20–5.82)
RDW: 15.7 % — ABNORMAL HIGH (ref 11.0–14.6)
WBC: 3.2 10*3/uL — ABNORMAL LOW (ref 4.0–10.3)
lymph#: 0.6 10*3/uL — ABNORMAL LOW (ref 0.9–3.3)

## 2015-02-04 LAB — COMPREHENSIVE METABOLIC PANEL (CC13)
ALBUMIN: 3.3 g/dL — AB (ref 3.5–5.0)
ALT: 24 U/L (ref 0–55)
AST: 29 U/L (ref 5–34)
Alkaline Phosphatase: 229 U/L — ABNORMAL HIGH (ref 40–150)
Anion Gap: 11 mEq/L (ref 3–11)
BUN: 13.9 mg/dL (ref 7.0–26.0)
CO2: 22 mEq/L (ref 22–29)
Calcium: 9.7 mg/dL (ref 8.4–10.4)
Chloride: 108 mEq/L (ref 98–109)
Creatinine: 1 mg/dL (ref 0.7–1.3)
EGFR: 79 mL/min/{1.73_m2} — AB (ref >90)
GLUCOSE: 173 mg/dL — AB (ref 70–140)
POTASSIUM: 3.8 meq/L (ref 3.5–5.1)
Sodium: 141 mEq/L (ref 136–145)
Total Bilirubin: 0.53 mg/dL (ref 0.20–1.20)
Total Protein: 6.6 g/dL (ref 6.4–8.3)

## 2015-02-04 MED ORDER — SODIUM CHLORIDE 0.9 % IV SOLN
500.0000 mg/m2 | Freq: Once | INTRAVENOUS | Status: AC
  Administered 2015-02-04: 1140 mg via INTRAVENOUS
  Filled 2015-02-04: qty 29.98

## 2015-02-04 MED ORDER — SODIUM CHLORIDE 0.9 % IJ SOLN
10.0000 mL | INTRAMUSCULAR | Status: DC | PRN
  Administered 2015-02-04: 10 mL
  Filled 2015-02-04: qty 10

## 2015-02-04 MED ORDER — PACLITAXEL PROTEIN-BOUND CHEMO INJECTION 100 MG
80.0000 mg/m2 | Freq: Once | INTRAVENOUS | Status: AC
  Administered 2015-02-04: 175 mg via INTRAVENOUS
  Filled 2015-02-04: qty 35

## 2015-02-04 MED ORDER — SODIUM CHLORIDE 0.9 % IV SOLN
Freq: Once | INTRAVENOUS | Status: AC
  Administered 2015-02-04: 11:00:00 via INTRAVENOUS
  Filled 2015-02-04: qty 4

## 2015-02-04 MED ORDER — SODIUM CHLORIDE 0.9 % IJ SOLN
10.0000 mL | INTRAMUSCULAR | Status: DC | PRN
  Administered 2015-02-04: 10 mL via INTRAVENOUS
  Filled 2015-02-04: qty 10

## 2015-02-04 MED ORDER — SODIUM CHLORIDE 0.9 % IV SOLN
Freq: Once | INTRAVENOUS | Status: AC
  Administered 2015-02-04: 11:00:00 via INTRAVENOUS

## 2015-02-04 MED ORDER — HEPARIN SOD (PORK) LOCK FLUSH 100 UNIT/ML IV SOLN
500.0000 [IU] | Freq: Once | INTRAVENOUS | Status: AC | PRN
  Administered 2015-02-04: 500 [IU]
  Filled 2015-02-04: qty 5

## 2015-02-04 NOTE — Patient Instructions (Signed)
[facility] Discharge Instructions for Patients Receiving Chemotherapy  Today you received the following chemotherapy agents Abraxane/Gemcitabine.   To help prevent nausea and vomiting after your treatment, we encourage you to take your nausea medication as directed.   If you develop nausea and vomiting that is not controlled by your nausea medication, call the clinic.   BELOW ARE SYMPTOMS THAT SHOULD BE REPORTED IMMEDIATELY:  *FEVER GREATER THAN 100.5 F  *CHILLS WITH OR WITHOUT FEVER  NAUSEA AND VOMITING THAT IS NOT CONTROLLED WITH YOUR NAUSEA MEDICATION  *UNUSUAL SHORTNESS OF BREATH  *UNUSUAL BRUISING OR BLEEDING  TENDERNESS IN MOUTH AND THROAT WITH OR WITHOUT PRESENCE OF ULCERS  *URINARY PROBLEMS  *BOWEL PROBLEMS  UNUSUAL RASH Items with * indicate a potential emergency and should be followed up as soon as possible.  Feel free to call the clinic you have any questions or concerns. The clinic phone number is [phone].  Please show the [facility] at check-in to the Emergency Department and triage nurse.

## 2015-02-04 NOTE — Patient Instructions (Signed)

## 2015-02-05 ENCOUNTER — Telehealth: Payer: Self-pay | Admitting: *Deleted

## 2015-02-05 ENCOUNTER — Telehealth: Payer: Self-pay | Admitting: Hematology and Oncology

## 2015-02-05 ENCOUNTER — Ambulatory Visit (HOSPITAL_BASED_OUTPATIENT_CLINIC_OR_DEPARTMENT_OTHER): Payer: Medicare Other | Admitting: Hematology and Oncology

## 2015-02-05 ENCOUNTER — Ambulatory Visit: Payer: Medicare Other

## 2015-02-05 ENCOUNTER — Ambulatory Visit: Payer: Medicare Other | Admitting: Hematology and Oncology

## 2015-02-05 ENCOUNTER — Encounter: Payer: Self-pay | Admitting: Hematology and Oncology

## 2015-02-05 VITALS — BP 148/69 | HR 80 | Temp 97.7°F | Resp 18 | Ht 74.0 in | Wt 223.1 lb

## 2015-02-05 DIAGNOSIS — D72819 Decreased white blood cell count, unspecified: Secondary | ICD-10-CM

## 2015-02-05 DIAGNOSIS — C250 Malignant neoplasm of head of pancreas: Secondary | ICD-10-CM

## 2015-02-05 DIAGNOSIS — G620 Drug-induced polyneuropathy: Secondary | ICD-10-CM

## 2015-02-05 DIAGNOSIS — D701 Agranulocytosis secondary to cancer chemotherapy: Secondary | ICD-10-CM

## 2015-02-05 DIAGNOSIS — T451X5A Adverse effect of antineoplastic and immunosuppressive drugs, initial encounter: Secondary | ICD-10-CM

## 2015-02-05 DIAGNOSIS — D630 Anemia in neoplastic disease: Secondary | ICD-10-CM

## 2015-02-05 DIAGNOSIS — R748 Abnormal levels of other serum enzymes: Secondary | ICD-10-CM

## 2015-02-05 DIAGNOSIS — R600 Localized edema: Secondary | ICD-10-CM

## 2015-02-05 MED ORDER — PEGFILGRASTIM INJECTION 6 MG/0.6ML ~~LOC~~
6.0000 mg | PREFILLED_SYRINGE | Freq: Once | SUBCUTANEOUS | Status: AC
  Administered 2015-02-05: 6 mg via SUBCUTANEOUS
  Filled 2015-02-05: qty 0.6

## 2015-02-05 NOTE — Telephone Encounter (Signed)
moved appt to 2pm...done.Marland KitchenMarland KitchenMarland KitchenKim contacted pt

## 2015-02-05 NOTE — Assessment & Plan Note (Signed)
This is related to low albumin state. I recommend cutting down on salt ingestion and elastic compression hose. I recommend not to pursue additional dosage of diuretics.

## 2015-02-05 NOTE — Patient Instructions (Signed)
Pegfilgrastim injection What is this medicine? PEGFILGRASTIM (peg fil GRA stim) is a long-acting granulocyte colony-stimulating factor that stimulates the growth of neutrophils, a type of white blood cell important in the body's fight against infection. It is used to reduce the incidence of fever and infection in patients with certain types of cancer who are receiving chemotherapy that affects the bone marrow. This medicine may be used for other purposes; ask your health care provider or pharmacist if you have questions. COMMON BRAND NAME(S): Neulasta What should I tell my health care provider before I take this medicine? They need to know if you have any of these conditions: -latex allergy -ongoing radiation therapy -sickle cell disease -skin reactions to acrylic adhesives (On-Body Injector only) -an unusual or allergic reaction to pegfilgrastim, filgrastim, other medicines, foods, dyes, or preservatives -pregnant or trying to get pregnant -breast-feeding How should I use this medicine? This medicine is for injection under the skin. If you get this medicine at home, you will be taught how to prepare and give the pre-filled syringe or how to use the On-body Injector. Refer to the patient Instructions for Use for detailed instructions. Use exactly as directed. Take your medicine at regular intervals. Do not take your medicine more often than directed. It is important that you put your used needles and syringes in a special sharps container. Do not put them in a trash can. If you do not have a sharps container, call your pharmacist or healthcare provider to get one. Talk to your pediatrician regarding the use of this medicine in children. Special care may be needed. Overdosage: If you think you have taken too much of this medicine contact a poison control center or emergency room at once. NOTE: This medicine is only for you. Do not share this medicine with others. What if I miss a dose? It is  important not to miss your dose. Call your doctor or health care professional if you miss your dose. If you miss a dose due to an On-body Injector failure or leakage, a new dose should be administered as soon as possible using a single prefilled syringe for manual use. What may interact with this medicine? Interactions have not been studied. Give your health care provider a list of all the medicines, herbs, non-prescription drugs, or dietary supplements you use. Also tell them if you smoke, drink alcohol, or use illegal drugs. Some items may interact with your medicine. This list may not describe all possible interactions. Give your health care provider a list of all the medicines, herbs, non-prescription drugs, or dietary supplements you use. Also tell them if you smoke, drink alcohol, or use illegal drugs. Some items may interact with your medicine. What should I watch for while using this medicine? You may need blood work done while you are taking this medicine. If you are going to need a MRI, CT scan, or other procedure, tell your doctor that you are using this medicine (On-Body Injector only). What side effects may I notice from receiving this medicine? Side effects that you should report to your doctor or health care professional as soon as possible: -allergic reactions like skin rash, itching or hives, swelling of the face, lips, or tongue -dizziness -fever -pain, redness, or irritation at site where injected -pinpoint red spots on the skin -shortness of breath or breathing problems -stomach or side pain, or pain at the shoulder -swelling -tiredness -trouble passing urine Side effects that usually do not require medical attention (report to your doctor   or health care professional if they continue or are bothersome): -bone pain -muscle pain This list may not describe all possible side effects. Call your doctor for medical advice about side effects. You may report side effects to FDA at  1-800-FDA-1088. Where should I keep my medicine? Keep out of the reach of children. Store pre-filled syringes in a refrigerator between 2 and 8 degrees C (36 and 46 degrees F). Do not freeze. Keep in carton to protect from light. Throw away this medicine if it is left out of the refrigerator for more than 48 hours. Throw away any unused medicine after the expiration date. NOTE: This sheet is a summary. It may not cover all possible information. If you have questions about this medicine, talk to your doctor, pharmacist, or health care provider.  2015, [employer]. (2014-02-01 16:14:05)  

## 2015-02-05 NOTE — Telephone Encounter (Signed)
Gave avs & calendar for April. Sent message to schedule treatment. °

## 2015-02-05 NOTE — Progress Notes (Signed)
[place] [place] OFFICE PROGRESS NOTE  Patient Care Team: Lavone Orn, MD as PCP - General (Internal Medicine) [name], MD as Referring Physician (Specialist) Provider Not In System [name], MD as Consulting Physician (Hematology and Oncology)  SUMMARY OF ONCOLOGIC HISTORY: Oncology History   Pancreatic cancer   Primary site: Pancreas   Staging method: AJCC 7th Edition   Clinical: Stage IIA (T3, N0, M0) signed by [name], MD on 06/13/2014  4:37 PM   Summary: Stage IIA (T3, N0, M0)       Malignant neoplasm of head of pancreas   06/05/2014 Imaging  MRI abdomen showed 2.2 cm lesion in the pancreatic head obstructing the common bile duct and the pancreatic duct is highly concerning for pancreatic adenocarcinoma.  There is moderate intra and extrahepatic biliary duct dilatation.    06/05/2014 Tumor Marker CA 19-9 is elevated at 1234   06/06/2014 Imaging Ct scan of chest showed 2 lung nodules, likely benign   06/07/2014 Procedure He had ERCP and stent placement   06/07/2014 Procedure He had endoscopic ultrasound with  fine needle aspiration.   06/07/2014 Pathology Results Accession: [id number] FNA is positive for pancreatic adenoca   06/14/2014 Surgery He has port placement   06/25/2014 - 08/01/2014 Chemotherapy He received neoadjuvant chemotherapy with Gemzar   06/25/2014 - 08/06/2014 Radiation Therapy he received neoadjuvant radiation: 50.4 Gy   08/13/2014 - 08/23/2014 Hospital Admission The patient was admitted to the hospital related to uncontrolled nausea, vomiting and severe epigastric pain and was found to have significant gastric ulcer, resolved with conservative management and total parenteral nutrition   09/06/2014 Imaging Repeat CT scan show enlarging lung nodule, suspicious for early metastatic disease.   10/18/2014 Imaging CT scan of the chest, abdomen and pelvis show metastatic disease in the lung and liver.   10/18/2014 Tumor Marker CA-19-9 at 1745   10/22/2014 - 12/03/2014  Chemotherapy The patient is placed on modified FOLFOX chemotherapy   11/05/2014 Tumor Marker Ca19-9 at 2447   12/14/2014 Imaging CT scan showed disease progression in the liver   12/14/2014 Tumor Marker CA 19-9 at 2050   12/17/2014 -  Chemotherapy the patient was starting on Abraxane with reduced dose Gemzar   12/24/2014 Tumor Marker CA-19-9 is at 1387.   12/26/2014 - 12/29/2014 Hospital Admission the patient was admitted to the hospital for hospital-acquired pneumonia.   01/07/2015 Adverse Reaction cycle 2 of treatment with reduced dose Abraxane due to recent hospitalization   01/12/2015 - 01/16/2015 Hospital Admission  the patient was admitted to the hospital again for hospital acquired pneumonia.    INTERVAL HISTORY: Please see below for problem oriented charting.  he is seen as part of his supportive care visit. He tolerated reduced dose chemotherapy well. His main complaint is just mild peripheral edema. Denies recent mucositis, nausea or vomiting. No recent infection.  REVIEW OF SYSTEMS:   Constitutional: Denies fevers, chills or abnormal weight loss Eyes: Denies blurriness of vision Ears, nose, mouth, throat, and face: Denies mucositis or sore throat Respiratory: Denies cough, dyspnea or wheezes Cardiovascular: Denies palpitation, chest discomfort Gastrointestinal:  Denies nausea, heartburn or change in bowel habits Skin: Denies abnormal skin rashes Lymphatics: Denies new lymphadenopathy or easy bruising Neurological:Denies numbness, tingling or new weaknesses Behavioral/Psych: Mood is stable, no new changes  All other systems were reviewed with the patient and are negative.  I have reviewed the past medical history, past surgical history, social history and family history with the patient and they are unchanged from  previous note.  ALLERGIES:  is allergic to ace inhibitors and ciprofloxacin.  MEDICATIONS:  Current Outpatient Prescriptions  Medication Sig Dispense Refill  .  acetaminophen (TYLENOL) 500 MG tablet Take 1,000 mg by mouth every 6 (six) hours as needed for fever.    Marland Kitchen aspirin EC 81 MG tablet Take 81 mg by mouth daily.    . B Complex-C (SUPER B COMPLEX/VITAMIN C PO) Take 1 tablet by mouth daily.     . cephALEXin (KEFLEX) 500 MG capsule Take 1 capsule (500 mg total) by mouth 4 (four) times daily. 40 capsule 0  . cholecalciferol (VITAMIN D) 1000 UNITS tablet Take 1,000 Units by mouth daily.    . dasatinib (SPRYCEL) 50 MG tablet Take 1 tablet (50 mg total) by mouth daily. 30 tablet 2  . insulin glargine (LANTUS) 100 UNIT/ML injection Inject 26 Units into the skin at bedtime.     Marland Kitchen lactulose (CHRONULAC) 10 GM/15ML solution Take 10 g by mouth 2 (two) times daily as needed for moderate constipation.     Marland Kitchen losartan (COZAAR) 50 MG tablet Take 50 mg by mouth daily.  11  . metFORMIN (GLUCOPHAGE) 1000 MG tablet Take 1,000 mg by mouth 2 (two) times daily with a meal.    . pantoprazole (PROTONIX) 40 MG tablet Take 1 tablet (40 mg total) by mouth daily. 60 tablet 6  . PRESCRIPTION MEDICATION Chemo at [facility] 01/07/15.    Marland Kitchen promethazine (PHENERGAN) 25 MG tablet Take 25 mg by mouth every 6 (six) hours as needed for nausea or vomiting (nausea).    . sennosides-docusate sodium (SENOKOT-S) 8.6-50 MG tablet Take 2 tablets by mouth at bedtime.     . simvastatin (ZOCOR) 20 MG tablet Take 20 mg by mouth daily.     . vitamin B-12 (CYANOCOBALAMIN) 1000 MCG tablet Take 1,000 mcg by mouth daily.     No current facility-administered medications for this visit.   Facility-Administered Medications Ordered in Other Visits  Medication Dose Route Frequency Provider Last Rate Last Dose  . heparin lock flush 100 unit/mL  500 Units Intravenous Once [name], MD      . sodium chloride 0.9 % injection 10 mL  10 mL Intravenous PRN [name], MD        PHYSICAL EXAMINATION: ECOG PERFORMANCE STATUS: 1 - Symptomatic but completely ambulatory  Filed Vitals:   02/05/15 1334  BP: 148/69   Pulse: 80  Temp: 97.7 F (36.5 C)  Resp: 18   Filed Weights   02/05/15 1334  Weight: 223 lb 1.6 oz (101.197 kg)    GENERAL:alert, no distress and comfortable. He is morbidly obese SKIN: skin color, texture, turgor are normal, no rashes or significant lesions EYES: normal, Conjunctiva are pink and non-injected, sclera clear Musculoskeletal:no cyanosis of digits and no clubbing  NEURO: alert & oriented x 3 with fluent speech, no focal motor/sensory deficits  LABORATORY DATA:  I have reviewed the data as listed    Component Value Date/Time   NA 141 02/04/2015 1006   NA 140 01/16/2015 0430   K 3.8 02/04/2015 1006   K 3.4* 01/16/2015 0430   CL 109 01/16/2015 0430   CL 107 04/27/2013 0926   CO2 22 02/04/2015 1006   CO2 26 01/16/2015 0430   GLUCOSE 173* 02/04/2015 1006   GLUCOSE 115* 01/16/2015 0430   GLUCOSE 212* 04/27/2013 0926   BUN 13.9 02/04/2015 1006   BUN <5* 01/16/2015 0430   CREATININE 1.0 02/04/2015 1006   CREATININE 0.95 01/16/2015 0430  CALCIUM 9.7 02/04/2015 1006   CALCIUM 8.5 01/16/2015 0430   PROT 6.6 02/04/2015 1006   PROT 5.7* 01/14/2015 0450   ALBUMIN 3.3* 02/04/2015 1006   ALBUMIN 2.5* 01/14/2015 0450   AST 29 02/04/2015 1006   AST 26 01/14/2015 0450   ALT 24 02/04/2015 1006   ALT 19 01/14/2015 0450   ALKPHOS 229* 02/04/2015 1006   ALKPHOS 327* 01/14/2015 0450   BILITOT 0.53 02/04/2015 1006   BILITOT 0.8 01/14/2015 0450   GFRNONAA 81* 01/16/2015 0430   GFRAA >90 01/16/2015 0430    No results found for: SPEP, UPEP  Lab Results  Component Value Date   WBC 3.2* 02/04/2015   NEUTROABS 1.9 02/04/2015   HGB 11.2* 02/04/2015   HCT 34.4* 02/04/2015   MCV 101.8* 02/04/2015   PLT 115* 02/04/2015      Chemistry      Component Value Date/Time   NA 141 02/04/2015 1006   NA 140 01/16/2015 0430   K 3.8 02/04/2015 1006   K 3.4* 01/16/2015 0430   CL 109 01/16/2015 0430   CL 107 04/27/2013 0926   CO2 22 02/04/2015 1006   CO2 26 01/16/2015 0430    BUN 13.9 02/04/2015 1006   BUN <5* 01/16/2015 0430   CREATININE 1.0 02/04/2015 1006   CREATININE 0.95 01/16/2015 0430      Component Value Date/Time   CALCIUM 9.7 02/04/2015 1006   CALCIUM 8.5 01/16/2015 0430   ALKPHOS 229* 02/04/2015 1006   ALKPHOS 327* 01/14/2015 0450   AST 29 02/04/2015 1006   AST 26 01/14/2015 0450   ALT 24 02/04/2015 1006   ALT 19 01/14/2015 0450   BILITOT 0.53 02/04/2015 1006   BILITOT 0.8 01/14/2015 0450      ASSESSMENT & PLAN:  Malignant neoplasm of head of pancreas His recent treatment was complicated by hospitalization with pneumonia. After I reduced the dose of chemotherapy with Abraxane 20% along with reduced dose Gemzar by 50% plus G-CSF support with each cycle,  He tolerated that better. I plan to see him again next month prior to date of treatment. Plan to order imaging study at the end of April to reassess and to make sure that he continues to respond to treatment. So far, he is encouraged to see that the tumor markers are trending down.     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 The patient denies recent history of bleeding such as epistaxis, hematuria or hematochezia. [name] I will observe for now.  He does not require transfusion now.   Elevated liver enzymes This is likely related to liver metastasis and fatty liver disease. We will continue with dose adjustment as above.   Leukopenia due to antineoplastic chemotherapy This is likely due to recent treatment. The patient denies recent history of fevers, cough, chills, diarrhea or dysuria. He is asymptomatic from the leukopenia. I will observe for now.  He will receive Neulasta injection for support today.    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 He will continue dose adjusted treatment with abraxane    Bilateral leg edema  This is related to low albumin  state. I recommend cutting down on salt ingestion and elastic compression hose. I recommend not to pursue additional dosage of diuretics.    Orders Placed This Encounter  Procedures  . CT Chest W Contrast    Standing Status: Future     Number of Occurrences:  Standing Expiration Date: 04/06/2016    Order Specific Question:  Reason for Exam (SYMPTOM  OR DIAGNOSIS REQUIRED)    Answer:  metatsatic pancreatic ca, staging    Order Specific Question:  Preferred imaging location?    Answer:  [facility]  . CT Abdomen Pelvis W Contrast    Standing Status: Future     Number of Occurrences:      Standing Expiration Date: 05/07/2016    Order Specific Question:  Reason for Exam (SYMPTOM  OR DIAGNOSIS REQUIRED)    Answer:  metatsatic pancreatic ca, staging    Order Specific Question:  Preferred imaging location?    Answer:  [facility]   All questions were answered. The patient knows to call the clinic with any problems, questions or concerns. No barriers to learning was detected. I spent 25 minutes counseling the patient face to face. The total time spent in the appointment was 30 minutes and more than 50% was on counseling and review of test results     [facility], [place], MD 02/05/2015 4:22 PM

## 2015-02-05 NOTE — Assessment & Plan Note (Signed)
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 He will continue dose adjusted treatment with abraxane

## 2015-02-05 NOTE — Progress Notes (Signed)
Nuelasta injection given by desk nurse.

## 2015-02-05 NOTE — Assessment & Plan Note (Signed)
This is likely due to recent treatment. The patient denies recent history of fevers, cough, chills, diarrhea or dysuria. He is asymptomatic from the leukopenia. I will observe for now.  He will receive Neulasta injection for support today.

## 2015-02-05 NOTE — Telephone Encounter (Signed)
Per staff message and POF I have scheduled appts. Advised scheduler of appts and to move lab/flush JMW  

## 2015-02-05 NOTE — Assessment & Plan Note (Signed)
This is likely due to recent treatment. The patient denies recent history of bleeding such as epistaxis, hematuria or hematochezia. [name] I will observe for now.  He does not require transfusion now. 

## 2015-02-05 NOTE — Assessment & Plan Note (Signed)
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 We will continue with dose adjustment as above.

## 2015-02-05 NOTE — Assessment & Plan Note (Signed)
His recent treatment was complicated by hospitalization with pneumonia. After I reduced the dose of chemotherapy with Abraxane 20% along with reduced dose Gemzar by 50% plus G-CSF support with each cycle,  He tolerated that better. I plan to see him again next month prior to date of treatment. Plan to order imaging study at the end of April to reassess and to make sure that he continues to respond to treatment. So far, he is encouraged to see that the tumor markers are trending down.

## 2015-02-08 ENCOUNTER — Encounter: Payer: Self-pay | Admitting: *Deleted

## 2015-02-08 NOTE — Progress Notes (Signed)
[place] Social Work  Clinical Social Work was referred by chemo education class to review and complete healthcare advance directives.  Clinical Social Worker met with patient and spouse in [place] office. Patient and spouse both completed directives. The patient designated spouse [name] as their primary healthcare agent and sister [name] as their secondary agent.  Patient also completed healthcare living will.    Clinical Social Worker notarized documents and made copies for patient/family. Clinical Social Worker will send documents to medical records to be scanned into patient's chart. Clinical Social Worker encouraged patient/family to contact with any additional questions or concerns.  [name], MSW, [place] Worker [facility] [phone]

## 2015-02-11 ENCOUNTER — Encounter: Payer: Self-pay | Admitting: Hematology and Oncology

## 2015-02-11 ENCOUNTER — Telehealth: Payer: Self-pay | Admitting: *Deleted

## 2015-02-11 NOTE — Telephone Encounter (Signed)
INSTRUCTED PT.'S WIFE TO CALL THE [facility] BILLING NUMBER AND ASK FOR A SUPERVISOR. SHE VOICES UNDERSTANDING.

## 2015-02-11 NOTE — Progress Notes (Signed)
I called and spoke with wife she has left a message. I advised her she must speak with billing and ask for supervisor-she said last convo the person was very rude/short with her. She has medicare/supplement.

## 2015-02-18 ENCOUNTER — Ambulatory Visit (HOSPITAL_BASED_OUTPATIENT_CLINIC_OR_DEPARTMENT_OTHER): Payer: Medicare Other

## 2015-02-18 ENCOUNTER — Ambulatory Visit: Payer: Medicare Other

## 2015-02-18 ENCOUNTER — Other Ambulatory Visit (HOSPITAL_BASED_OUTPATIENT_CLINIC_OR_DEPARTMENT_OTHER): Payer: Medicare Other

## 2015-02-18 DIAGNOSIS — Z5111 Encounter for antineoplastic chemotherapy: Secondary | ICD-10-CM

## 2015-02-18 DIAGNOSIS — Z95828 Presence of other vascular implants and grafts: Secondary | ICD-10-CM

## 2015-02-18 DIAGNOSIS — C250 Malignant neoplasm of head of pancreas: Secondary | ICD-10-CM

## 2015-02-18 LAB — CBC WITH DIFFERENTIAL/PLATELET
BASO%: 0.5 % (ref 0.0–2.0)
BASOS ABS: 0.1 10*3/uL (ref 0.0–0.1)
EOS%: 1.7 % (ref 0.0–7.0)
Eosinophils Absolute: 0.2 10*3/uL (ref 0.0–0.5)
HEMATOCRIT: 32.7 % — AB (ref 38.4–49.9)
HEMOGLOBIN: 10.9 g/dL — AB (ref 13.0–17.1)
LYMPH#: 0.9 10*3/uL (ref 0.9–3.3)
LYMPH%: 8.7 % — ABNORMAL LOW (ref 14.0–49.0)
MCH: 33.4 pg (ref 27.2–33.4)
MCHC: 33.4 g/dL (ref 32.0–36.0)
MCV: 99.8 fL — ABNORMAL HIGH (ref 79.3–98.0)
MONO#: 0.8 10*3/uL (ref 0.1–0.9)
MONO%: 7.7 % (ref 0.0–14.0)
NEUT%: 81.4 % — AB (ref 39.0–75.0)
NEUTROS ABS: 8.8 10*3/uL — AB (ref 1.5–6.5)
Platelets: 271 10*3/uL (ref 140–400)
RBC: 3.28 10*6/uL — ABNORMAL LOW (ref 4.20–5.82)
RDW: 17.4 % — AB (ref 11.0–14.6)
WBC: 10.8 10*3/uL — ABNORMAL HIGH (ref 4.0–10.3)

## 2015-02-18 LAB — COMPREHENSIVE METABOLIC PANEL (CC13)
ALT: 18 U/L (ref 0–55)
ANION GAP: 12 meq/L — AB (ref 3–11)
AST: 26 U/L (ref 5–34)
Albumin: 3.2 g/dL — ABNORMAL LOW (ref 3.5–5.0)
Alkaline Phosphatase: 210 U/L — ABNORMAL HIGH (ref 40–150)
BILIRUBIN TOTAL: 0.41 mg/dL (ref 0.20–1.20)
BUN: 9.9 mg/dL (ref 7.0–26.0)
CO2: 24 mEq/L (ref 22–29)
CREATININE: 0.9 mg/dL (ref 0.7–1.3)
Calcium: 9.4 mg/dL (ref 8.4–10.4)
Chloride: 109 mEq/L (ref 98–109)
EGFR: 84 mL/min/{1.73_m2} — AB (ref >90)
GLUCOSE: 112 mg/dL (ref 70–140)
Potassium: 3.6 mEq/L (ref 3.5–5.1)
Sodium: 144 mEq/L (ref 136–145)
TOTAL PROTEIN: 6.3 g/dL — AB (ref 6.4–8.3)

## 2015-02-18 MED ORDER — SODIUM CHLORIDE 0.9 % IV SOLN
Freq: Once | INTRAVENOUS | Status: AC
  Administered 2015-02-18: 11:00:00 via INTRAVENOUS
  Filled 2015-02-18: qty 4

## 2015-02-18 MED ORDER — SODIUM CHLORIDE 0.9 % IV SOLN
Freq: Once | INTRAVENOUS | Status: AC
  Administered 2015-02-18: 10:00:00 via INTRAVENOUS

## 2015-02-18 MED ORDER — HEPARIN SOD (PORK) LOCK FLUSH 100 UNIT/ML IV SOLN
500.0000 [IU] | Freq: Once | INTRAVENOUS | Status: AC | PRN
  Administered 2015-02-18: 500 [IU]
  Filled 2015-02-18: qty 5

## 2015-02-18 MED ORDER — PACLITAXEL PROTEIN-BOUND CHEMO INJECTION 100 MG
80.0000 mg/m2 | Freq: Once | INTRAVENOUS | Status: AC
  Administered 2015-02-18: 175 mg via INTRAVENOUS
  Filled 2015-02-18: qty 35

## 2015-02-18 MED ORDER — SODIUM CHLORIDE 0.9 % IJ SOLN
10.0000 mL | INTRAMUSCULAR | Status: DC | PRN
  Administered 2015-02-18: 10 mL
  Filled 2015-02-18: qty 10

## 2015-02-18 MED ORDER — SODIUM CHLORIDE 0.9 % IV SOLN
500.0000 mg/m2 | Freq: Once | INTRAVENOUS | Status: AC
  Administered 2015-02-18: 1140 mg via INTRAVENOUS
  Filled 2015-02-18: qty 29.98

## 2015-02-18 MED ORDER — SODIUM CHLORIDE 0.9 % IJ SOLN
10.0000 mL | INTRAMUSCULAR | Status: DC | PRN
  Administered 2015-02-18: 10 mL via INTRAVENOUS
  Filled 2015-02-18: qty 10

## 2015-02-18 NOTE — Patient Instructions (Signed)

## 2015-02-18 NOTE — Patient Instructions (Addendum)
[facility] Discharge Instructions for Patients Receiving Chemotherapy  Today you received the following chemotherapy agents Abraxane/Gemzar.  To help prevent nausea and vomiting after your treatment, we encourage you to take your nausea medication as prescribed.    If you develop nausea and vomiting that is not controlled by your nausea medication, call the clinic.   BELOW ARE SYMPTOMS THAT SHOULD BE REPORTED IMMEDIATELY:  *FEVER GREATER THAN 100.5 F  *CHILLS WITH OR WITHOUT FEVER  NAUSEA AND VOMITING THAT IS NOT CONTROLLED WITH YOUR NAUSEA MEDICATION  *UNUSUAL SHORTNESS OF BREATH  *UNUSUAL BRUISING OR BLEEDING  TENDERNESS IN MOUTH AND THROAT WITH OR WITHOUT PRESENCE OF ULCERS  *URINARY PROBLEMS  *BOWEL PROBLEMS  UNUSUAL RASH Items with * indicate a potential emergency and should be followed up as soon as possible.  Feel free to call the clinic you have any questions or concerns. The clinic phone number is [phone].  Please show the CHEMO ALERT CARD at check-in to the Emergency Department and triage nurse.   

## 2015-02-19 NOTE — Progress Notes (Signed)
This encounter was created in error - please disregard.

## 2015-02-25 ENCOUNTER — Ambulatory Visit (HOSPITAL_BASED_OUTPATIENT_CLINIC_OR_DEPARTMENT_OTHER): Payer: Medicare Other

## 2015-02-25 ENCOUNTER — Encounter: Payer: Self-pay | Admitting: Hematology and Oncology

## 2015-02-25 ENCOUNTER — Telehealth: Payer: Self-pay | Admitting: Hematology and Oncology

## 2015-02-25 ENCOUNTER — Other Ambulatory Visit (HOSPITAL_BASED_OUTPATIENT_CLINIC_OR_DEPARTMENT_OTHER): Payer: Medicare Other

## 2015-02-25 ENCOUNTER — Ambulatory Visit: Payer: Medicare Other

## 2015-02-25 ENCOUNTER — Ambulatory Visit (HOSPITAL_BASED_OUTPATIENT_CLINIC_OR_DEPARTMENT_OTHER): Payer: Medicare Other | Admitting: Hematology and Oncology

## 2015-02-25 VITALS — BP 135/64 | HR 89 | Temp 98.2°F | Resp 17 | Ht 74.0 in | Wt 222.6 lb

## 2015-02-25 DIAGNOSIS — Z95828 Presence of other vascular implants and grafts: Secondary | ICD-10-CM

## 2015-02-25 DIAGNOSIS — C250 Malignant neoplasm of head of pancreas: Secondary | ICD-10-CM

## 2015-02-25 DIAGNOSIS — R748 Abnormal levels of other serum enzymes: Secondary | ICD-10-CM

## 2015-02-25 DIAGNOSIS — T451X5A Adverse effect of antineoplastic and immunosuppressive drugs, initial encounter: Secondary | ICD-10-CM

## 2015-02-25 DIAGNOSIS — R600 Localized edema: Secondary | ICD-10-CM | POA: Diagnosis not present

## 2015-02-25 DIAGNOSIS — G620 Drug-induced polyneuropathy: Secondary | ICD-10-CM

## 2015-02-25 DIAGNOSIS — D630 Anemia in neoplastic disease: Secondary | ICD-10-CM

## 2015-02-25 DIAGNOSIS — Z5111 Encounter for antineoplastic chemotherapy: Secondary | ICD-10-CM

## 2015-02-25 LAB — COMPREHENSIVE METABOLIC PANEL (CC13)
ALT: 19 U/L (ref 0–55)
ANION GAP: 9 meq/L (ref 3–11)
AST: 28 U/L (ref 5–34)
Albumin: 3.3 g/dL — ABNORMAL LOW (ref 3.5–5.0)
Alkaline Phosphatase: 275 U/L — ABNORMAL HIGH (ref 40–150)
BILIRUBIN TOTAL: 0.51 mg/dL (ref 0.20–1.20)
BUN: 13.8 mg/dL (ref 7.0–26.0)
CALCIUM: 9.6 mg/dL (ref 8.4–10.4)
CHLORIDE: 109 meq/L (ref 98–109)
CO2: 24 meq/L (ref 22–29)
CREATININE: 1 mg/dL (ref 0.7–1.3)
EGFR: 74 mL/min/{1.73_m2} — AB (ref >90)
GLUCOSE: 133 mg/dL (ref 70–140)
Potassium: 3.7 mEq/L (ref 3.5–5.1)
Sodium: 143 mEq/L (ref 136–145)
Total Protein: 6.6 g/dL (ref 6.4–8.3)

## 2015-02-25 LAB — CBC WITH DIFFERENTIAL/PLATELET
BASO%: 1.4 % (ref 0.0–2.0)
Basophils Absolute: 0.1 10*3/uL (ref 0.0–0.1)
EOS%: 0.6 % (ref 0.0–7.0)
Eosinophils Absolute: 0 10*3/uL (ref 0.0–0.5)
HCT: 33 % — ABNORMAL LOW (ref 38.4–49.9)
HGB: 10.9 g/dL — ABNORMAL LOW (ref 13.0–17.1)
LYMPH#: 1.1 10*3/uL (ref 0.9–3.3)
LYMPH%: 13.9 % — ABNORMAL LOW (ref 14.0–49.0)
MCH: 32.9 pg (ref 27.2–33.4)
MCHC: 33 g/dL (ref 32.0–36.0)
MCV: 99.5 fL — ABNORMAL HIGH (ref 79.3–98.0)
MONO#: 0.7 10*3/uL (ref 0.1–0.9)
MONO%: 9 % (ref 0.0–14.0)
NEUT#: 5.7 10*3/uL (ref 1.5–6.5)
NEUT%: 75.1 % — AB (ref 39.0–75.0)
Platelets: 186 10*3/uL (ref 140–400)
RBC: 3.32 10*6/uL — ABNORMAL LOW (ref 4.20–5.82)
RDW: 16.2 % — AB (ref 11.0–14.6)
WBC: 7.6 10*3/uL (ref 4.0–10.3)

## 2015-02-25 LAB — CANCER ANTIGEN 19-9: CA 19 9: 556.1 U/mL — AB (ref <35.0)

## 2015-02-25 MED ORDER — DEXAMETHASONE SODIUM PHOSPHATE 100 MG/10ML IJ SOLN
Freq: Once | INTRAMUSCULAR | Status: AC
  Administered 2015-02-25: 09:00:00 via INTRAVENOUS
  Filled 2015-02-25: qty 4

## 2015-02-25 MED ORDER — SODIUM CHLORIDE 0.9 % IJ SOLN
10.0000 mL | INTRAMUSCULAR | Status: DC | PRN
  Administered 2015-02-25: 10 mL
  Filled 2015-02-25: qty 10

## 2015-02-25 MED ORDER — SODIUM CHLORIDE 0.9 % IV SOLN
500.0000 mg/m2 | Freq: Once | INTRAVENOUS | Status: AC
  Administered 2015-02-25: 1140 mg via INTRAVENOUS
  Filled 2015-02-25: qty 29.98

## 2015-02-25 MED ORDER — SODIUM CHLORIDE 0.9 % IV SOLN
Freq: Once | INTRAVENOUS | Status: AC
  Administered 2015-02-25: 09:00:00 via INTRAVENOUS

## 2015-02-25 MED ORDER — PACLITAXEL PROTEIN-BOUND CHEMO INJECTION 100 MG
80.0000 mg/m2 | Freq: Once | INTRAVENOUS | Status: AC
  Administered 2015-02-25: 175 mg via INTRAVENOUS
  Filled 2015-02-25: qty 35

## 2015-02-25 MED ORDER — SODIUM CHLORIDE 0.9 % IJ SOLN
10.0000 mL | INTRAMUSCULAR | Status: DC | PRN
  Administered 2015-02-25: 10 mL via INTRAVENOUS
  Filled 2015-02-25: qty 10

## 2015-02-25 MED ORDER — HEPARIN SOD (PORK) LOCK FLUSH 100 UNIT/ML IV SOLN
500.0000 [IU] | Freq: Once | INTRAVENOUS | Status: AC | PRN
  Administered 2015-02-25: 500 [IU]
  Filled 2015-02-25: qty 5

## 2015-02-25 NOTE — Patient Instructions (Signed)

## 2015-02-25 NOTE — Assessment & Plan Note (Signed)
This is likely due to recent treatment. The patient denies recent history of bleeding such as epistaxis, hematuria or hematochezia. [name] I will observe for now.  He does not require transfusion now. 

## 2015-02-25 NOTE — Assessment & Plan Note (Signed)
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 He will continue dose adjusted treatment with abraxane

## 2015-02-25 NOTE — Assessment & Plan Note (Signed)
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 We will continue with dose adjustment as above.

## 2015-02-25 NOTE — Assessment & Plan Note (Signed)
His recent treatment was complicated by hospitalization with pneumonia. After I reduced the dose of chemotherapy with Abraxane 20% along with reduced dose Gemzar by 50% plus G-CSF support with each cycle,  He tolerated that better. I plan to see him end of the month prior to date of treatment. So far, he is encouraged to see that the tumor markers are trending down.

## 2015-02-25 NOTE — Telephone Encounter (Signed)
Gave wife avs report and appointments for April and May

## 2015-02-25 NOTE — Progress Notes (Signed)
[facility] OFFICE PROGRESS NOTE  Patient Care Team: Lavone Orn, MD as PCP - General (Internal Medicine) Evan Comings, MD as Referring Physician (Specialist) Provider Not In System [name], MD as Consulting Physician (Hematology and Oncology)  SUMMARY OF ONCOLOGIC HISTORY: Oncology History   Pancreatic cancer   Primary site: Pancreas   Staging method: AJCC 7th Edition   Clinical: Stage IIA (T3, N0, M0) signed by [name], MD on 06/13/2014  4:37 PM   Summary: Stage IIA (T3, N0, M0)       Malignant neoplasm of head of pancreas   06/05/2014 Imaging  MRI abdomen showed 2.2 cm lesion in the pancreatic head obstructing the common bile duct and the pancreatic duct is highly concerning for pancreatic adenocarcinoma.  There is moderate intra and extrahepatic biliary duct dilatation.    06/05/2014 Tumor Marker CA 19-9 is elevated at 1234   06/06/2014 Imaging Ct scan of chest showed 2 lung nodules, likely benign   06/07/2014 Procedure He had ERCP and stent placement   06/07/2014 Procedure He had endoscopic ultrasound with  fine needle aspiration.   06/07/2014 Pathology Results Accession: [id number] FNA is positive for pancreatic adenoca   06/14/2014 Surgery He has port placement   06/25/2014 - 08/01/2014 Chemotherapy He received neoadjuvant chemotherapy with Gemzar   06/25/2014 - 08/06/2014 Radiation Therapy he received neoadjuvant radiation: 50.4 Gy   08/13/2014 - 08/23/2014 Hospital Admission The patient was admitted to the hospital related to uncontrolled nausea, vomiting and severe epigastric pain and was found to have significant gastric ulcer, resolved with conservative management and total parenteral nutrition   09/06/2014 Imaging Repeat CT scan show enlarging lung nodule, suspicious for early metastatic disease.   10/18/2014 Imaging CT scan of the chest, abdomen and pelvis show metastatic disease in the lung and liver.   10/18/2014 Tumor Marker CA-19-9 at 1745   10/22/2014 - 12/03/2014  Chemotherapy The patient is placed on modified FOLFOX chemotherapy   11/05/2014 Tumor Marker Ca19-9 at 2447   12/14/2014 Imaging CT scan showed disease progression in the liver   12/14/2014 Tumor Marker CA 19-9 at 2050   12/17/2014 -  Chemotherapy the patient was starting on Abraxane with reduced dose Gemzar   12/24/2014 Tumor Marker CA-19-9 is at 1387.   12/26/2014 - 12/29/2014 Hospital Admission the patient was admitted to the hospital for hospital-acquired pneumonia.   01/07/2015 Adverse Reaction cycle 2 of treatment with reduced dose Abraxane due to recent hospitalization   01/12/2015 - 01/16/2015 Hospital Admission  the patient was admitted to the hospital again for hospital acquired pneumonia.    INTERVAL HISTORY: Please see below for problem oriented charting. He is seen prior to cycle 5 of treatment. So far he is tolerating treatment well. He continues a mild peripheral neuropathy. Denies recent nausea or vomiting. No recurrent episodes of pneumonia.  REVIEW OF SYSTEMS:   Constitutional: Denies fevers, chills or abnormal weight loss Eyes: Denies blurriness of vision Ears, nose, mouth, throat, and face: Denies mucositis or sore throat Respiratory: Denies cough, dyspnea or wheezes Cardiovascular: Denies palpitation, chest discomfort or lower extremity swelling Gastrointestinal:  Denies nausea, heartburn or change in bowel habits Skin: Denies abnormal skin rashes Lymphatics: Denies new lymphadenopathy or easy bruising Neurological:Denies numbness, tingling or new weaknesses Behavioral/Psych: Mood is stable, no new changes  All other systems were reviewed with the patient and are negative.  I have reviewed the past medical history, past surgical history, social history and family history with the patient and they are  unchanged from previous note.  ALLERGIES:  is allergic to ace inhibitors and ciprofloxacin.  MEDICATIONS:  Current Outpatient Prescriptions  Medication Sig Dispense Refill   . acetaminophen (TYLENOL) 500 MG tablet Take 1,000 mg by mouth every 6 (six) hours as needed for fever.    Marland Kitchen aspirin EC 81 MG tablet Take 81 mg by mouth daily.    . B Complex-C (SUPER B COMPLEX/VITAMIN C PO) Take 1 tablet by mouth daily.     . cholecalciferol (VITAMIN D) 1000 UNITS tablet Take 1,000 Units by mouth daily.    . dasatinib (SPRYCEL) 50 MG tablet Take 1 tablet (50 mg total) by mouth daily. 30 tablet 2  . insulin glargine (LANTUS) 100 UNIT/ML injection Inject 26 Units into the skin at bedtime.     Marland Kitchen lactulose (CHRONULAC) 10 GM/15ML solution Take 10 g by mouth 2 (two) times daily as needed for moderate constipation.     Marland Kitchen losartan (COZAAR) 50 MG tablet Take 50 mg by mouth daily.  11  . metFORMIN (GLUCOPHAGE) 1000 MG tablet Take 1,000 mg by mouth 2 (two) times daily with a meal.    . pantoprazole (PROTONIX) 40 MG tablet Take 1 tablet (40 mg total) by mouth daily. 60 tablet 6  . PRESCRIPTION MEDICATION Chemo at [facility] 01/07/15.    Marland Kitchen promethazine (PHENERGAN) 25 MG tablet Take 25 mg by mouth every 6 (six) hours as needed for nausea or vomiting (nausea).    . sennosides-docusate sodium (SENOKOT-S) 8.6-50 MG tablet Take 2 tablets by mouth at bedtime.     . simvastatin (ZOCOR) 20 MG tablet Take 20 mg by mouth daily.     . vitamin B-12 (CYANOCOBALAMIN) 1000 MCG tablet Take 1,000 mcg by mouth daily.     No current facility-administered medications for this visit.    PHYSICAL EXAMINATION: ECOG PERFORMANCE STATUS: 1 - Symptomatic but completely ambulatory  Filed Vitals:   02/25/15 0849  BP: 135/64  Pulse: 89  Temp: 98.2 F (36.8 C)  Resp: 17   Filed Weights   02/25/15 0849  Weight: 222 lb 9.6 oz (100.971 kg)    GENERAL:alert, no distress and comfortable. He is morbidly obese SKIN: skin color, texture, turgor are normal, no rashes or significant lesions EYES: normal, Conjunctiva are pink and non-injected, sclera clear OROPHARYNX:no exudate, no erythema and lips, buccal mucosa, and  tongue normal  NECK: supple, thyroid normal size, non-tender, without nodularity LYMPH:  no palpable lymphadenopathy in the cervical, axillary or inguinal LUNGS: clear to auscultation and percussion with normal breathing effort HEART: regular rate & rhythm and no murmurs with mild bilateral lower extremity edema ABDOMEN:abdomen soft, non-tender and normal bowel sounds Musculoskeletal:no cyanosis of digits and no clubbing  NEURO: alert & oriented x 3 with fluent speech, no focal motor/sensory deficits  LABORATORY DATA:  I have reviewed the data as listed    Component Value Date/Time   NA 143 02/25/2015 0816   NA 140 01/16/2015 0430   K 3.7 02/25/2015 0816   K 3.4* 01/16/2015 0430   CL 109 01/16/2015 0430   CL 107 04/27/2013 0926   CO2 24 02/25/2015 0816   CO2 26 01/16/2015 0430   GLUCOSE 133 02/25/2015 0816   GLUCOSE 115* 01/16/2015 0430   GLUCOSE 212* 04/27/2013 0926   BUN 13.8 02/25/2015 0816   BUN <5* 01/16/2015 0430   CREATININE 1.0 02/25/2015 0816   CREATININE 0.95 01/16/2015 0430   CALCIUM 9.6 02/25/2015 0816   CALCIUM 8.5 01/16/2015 0430   PROT 6.6  02/25/2015 0816   PROT 5.7* 01/14/2015 0450   ALBUMIN 3.3* 02/25/2015 0816   ALBUMIN 2.5* 01/14/2015 0450   AST 28 02/25/2015 0816   AST 26 01/14/2015 0450   ALT 19 02/25/2015 0816   ALT 19 01/14/2015 0450   ALKPHOS 275* 02/25/2015 0816   ALKPHOS 327* 01/14/2015 0450   BILITOT 0.51 02/25/2015 0816   BILITOT 0.8 01/14/2015 0450   GFRNONAA 81* 01/16/2015 0430   GFRAA >90 01/16/2015 0430    No results found for: SPEP, UPEP  Lab Results  Component Value Date   WBC 7.6 02/25/2015   NEUTROABS 5.7 02/25/2015   HGB 10.9* 02/25/2015   HCT 33.0* 02/25/2015   MCV 99.5* 02/25/2015   PLT 186 02/25/2015      Chemistry      Component Value Date/Time   NA 143 02/25/2015 0816   NA 140 01/16/2015 0430   K 3.7 02/25/2015 0816   K 3.4* 01/16/2015 0430   CL 109 01/16/2015 0430   CL 107 04/27/2013 0926   CO2 24 02/25/2015  0816   CO2 26 01/16/2015 0430   BUN 13.8 02/25/2015 0816   BUN <5* 01/16/2015 0430   CREATININE 1.0 02/25/2015 0816   CREATININE 0.95 01/16/2015 0430      Component Value Date/Time   CALCIUM 9.6 02/25/2015 0816   CALCIUM 8.5 01/16/2015 0430   ALKPHOS 275* 02/25/2015 0816   ALKPHOS 327* 01/14/2015 0450   AST 28 02/25/2015 0816   AST 26 01/14/2015 0450   ALT 19 02/25/2015 0816   ALT 19 01/14/2015 0450   BILITOT 0.51 02/25/2015 0816   BILITOT 0.8 01/14/2015 0450     ASSESSMENT & PLAN:  Malignant neoplasm of head of pancreas His recent treatment was complicated by hospitalization with pneumonia. After I reduced the dose of chemotherapy with Abraxane 20% along with reduced dose Gemzar by 50% plus G-CSF support with each cycle,  He tolerated that better. I plan to see him end of the month prior to date of treatment. So far, he is encouraged to see that the tumor markers are trending down.     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 The patient denies recent history of bleeding such as epistaxis, hematuria or hematochezia. [name] I will observe for now.  He does not require transfusion now.    Bilateral leg edema  This is related to low albumin state. I recommend cutting down on salt ingestion and elastic compression hose. I recommend not to pursue additional dosage of diuretics.    Elevated liver enzymes This is likely related to liver metastasis and fatty liver disease. We will continue with dose adjustment as above.    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 He will continue dose adjusted treatment with abraxane     No orders of the defined types were placed in this encounter.   All questions were answered. The patient knows to call the clinic with any problems, questions or concerns. No barriers to learning was detected. I spent 25 minutes  counseling the patient face to face. The total time spent in the appointment was 30 minutes and more than 50% was on counseling and review of test results     [facility], [place], MD 02/25/2015 9:21 AM

## 2015-02-25 NOTE — Patient Instructions (Signed)
[facility] Discharge Instructions for Patients Receiving Chemotherapy  Today you received the following chemotherapy agents: Abraxane and Gemzar   To help prevent nausea and vomiting after your treatment, we encourage you to take your nausea medication as directed.    If you develop nausea and vomiting that is not controlled by your nausea medication, call the clinic.   BELOW ARE SYMPTOMS THAT SHOULD BE REPORTED IMMEDIATELY:  *FEVER GREATER THAN 100.5 F  *CHILLS WITH OR WITHOUT FEVER  NAUSEA AND VOMITING THAT IS NOT CONTROLLED WITH YOUR NAUSEA MEDICATION  *UNUSUAL SHORTNESS OF BREATH  *UNUSUAL BRUISING OR BLEEDING  TENDERNESS IN MOUTH AND THROAT WITH OR WITHOUT PRESENCE OF ULCERS  *URINARY PROBLEMS  *BOWEL PROBLEMS  UNUSUAL RASH Items with * indicate a potential emergency and should be followed up as soon as possible.  Feel free to call the clinic you have any questions or concerns. The clinic phone number is [phone].  Please show the CHEMO ALERT CARD at check-in to the Emergency Department and triage nurse.   

## 2015-02-25 NOTE — Assessment & Plan Note (Signed)
This is related to low albumin state. I recommend cutting down on salt ingestion and elastic compression hose. I recommend not to pursue additional dosage of diuretics.

## 2015-02-26 ENCOUNTER — Ambulatory Visit (HOSPITAL_BASED_OUTPATIENT_CLINIC_OR_DEPARTMENT_OTHER): Payer: Medicare Other

## 2015-02-26 DIAGNOSIS — C250 Malignant neoplasm of head of pancreas: Secondary | ICD-10-CM

## 2015-02-26 DIAGNOSIS — Z5189 Encounter for other specified aftercare: Secondary | ICD-10-CM

## 2015-02-26 MED ORDER — PEGFILGRASTIM INJECTION 6 MG/0.6ML ~~LOC~~
6.0000 mg | PREFILLED_SYRINGE | Freq: Once | SUBCUTANEOUS | Status: AC
  Administered 2015-02-26: 6 mg via SUBCUTANEOUS
  Filled 2015-02-26: qty 0.6

## 2015-03-04 ENCOUNTER — Telehealth: Payer: Self-pay | Admitting: *Deleted

## 2015-03-04 ENCOUNTER — Emergency Department (HOSPITAL_COMMUNITY)
Admission: EM | Admit: 2015-03-04 | Discharge: 2015-03-04 | Disposition: A | Discharge status: Home / Self Care | Payer: Medicare Other | Attending: Emergency Medicine | Admitting: Emergency Medicine

## 2015-03-04 ENCOUNTER — Encounter: Payer: Self-pay | Admitting: Hematology and Oncology

## 2015-03-04 ENCOUNTER — Ambulatory Visit: Payer: Medicare Other | Admitting: Hematology and Oncology

## 2015-03-04 ENCOUNTER — Other Ambulatory Visit: Payer: Self-pay | Admitting: Hematology and Oncology

## 2015-03-04 ENCOUNTER — Telehealth: Payer: Self-pay | Admitting: Hematology and Oncology

## 2015-03-04 ENCOUNTER — Other Ambulatory Visit: Payer: Medicare Other

## 2015-03-04 ENCOUNTER — Emergency Department (HOSPITAL_COMMUNITY): Payer: Medicare Other

## 2015-03-04 ENCOUNTER — Encounter (HOSPITAL_COMMUNITY): Payer: Self-pay

## 2015-03-04 DIAGNOSIS — Z862 Personal history of diseases of the blood and blood-forming organs and certain disorders involving the immune mechanism: Secondary | ICD-10-CM | POA: Diagnosis not present

## 2015-03-04 DIAGNOSIS — K219 Gastro-esophageal reflux disease without esophagitis: Secondary | ICD-10-CM | POA: Diagnosis not present

## 2015-03-04 DIAGNOSIS — C259 Malignant neoplasm of pancreas, unspecified: Secondary | ICD-10-CM | POA: Insufficient documentation

## 2015-03-04 DIAGNOSIS — E1140 Type 2 diabetes mellitus with diabetic neuropathy, unspecified: Secondary | ICD-10-CM | POA: Insufficient documentation

## 2015-03-04 DIAGNOSIS — Z794 Long term (current) use of insulin: Secondary | ICD-10-CM | POA: Diagnosis not present

## 2015-03-04 DIAGNOSIS — I10 Essential (primary) hypertension: Secondary | ICD-10-CM | POA: Insufficient documentation

## 2015-03-04 DIAGNOSIS — Z9221 Personal history of antineoplastic chemotherapy: Secondary | ICD-10-CM | POA: Diagnosis not present

## 2015-03-04 DIAGNOSIS — Z8659 Personal history of other mental and behavioral disorders: Secondary | ICD-10-CM | POA: Insufficient documentation

## 2015-03-04 DIAGNOSIS — E780 Pure hypercholesterolemia: Secondary | ICD-10-CM | POA: Insufficient documentation

## 2015-03-04 DIAGNOSIS — G8929 Other chronic pain: Secondary | ICD-10-CM | POA: Diagnosis not present

## 2015-03-04 DIAGNOSIS — Z7982 Long term (current) use of aspirin: Secondary | ICD-10-CM | POA: Insufficient documentation

## 2015-03-04 DIAGNOSIS — Z87891 Personal history of nicotine dependence: Secondary | ICD-10-CM | POA: Diagnosis not present

## 2015-03-04 DIAGNOSIS — I4891 Unspecified atrial fibrillation: Secondary | ICD-10-CM | POA: Diagnosis present

## 2015-03-04 DIAGNOSIS — Z79899 Other long term (current) drug therapy: Secondary | ICD-10-CM | POA: Diagnosis not present

## 2015-03-04 DIAGNOSIS — I479 Paroxysmal tachycardia, unspecified: Secondary | ICD-10-CM | POA: Insufficient documentation

## 2015-03-04 DIAGNOSIS — R000 Tachycardia, unspecified: Secondary | ICD-10-CM

## 2015-03-04 DIAGNOSIS — M1990 Unspecified osteoarthritis, unspecified site: Secondary | ICD-10-CM | POA: Diagnosis not present

## 2015-03-04 HISTORY — DX: Tachycardia, unspecified: R00.0

## 2015-03-04 LAB — URINALYSIS, ROUTINE W REFLEX MICROSCOPIC
Bilirubin Urine: NEGATIVE
Glucose, UA: NEGATIVE mg/dL
Hgb urine dipstick: NEGATIVE
KETONES UR: NEGATIVE mg/dL
LEUKOCYTES UA: NEGATIVE
NITRITE: NEGATIVE
PROTEIN: 100 mg/dL — AB
Specific Gravity, Urine: 1.022 (ref 1.005–1.030)
UROBILINOGEN UA: 1 mg/dL (ref 0.0–1.0)
pH: 5 (ref 5.0–8.0)

## 2015-03-04 LAB — CBC WITH DIFFERENTIAL/PLATELET
BASOS ABS: 0.1 10*3/uL (ref 0.0–0.1)
BASOS PCT: 0 % (ref 0–1)
EOS ABS: 0 10*3/uL (ref 0.0–0.7)
Eosinophils Relative: 0 % (ref 0–5)
HCT: 30.6 % — ABNORMAL LOW (ref 39.0–52.0)
Hemoglobin: 10.3 g/dL — ABNORMAL LOW (ref 13.0–17.0)
Lymphocytes Relative: 5 % — ABNORMAL LOW (ref 12–46)
Lymphs Abs: 1.2 10*3/uL (ref 0.7–4.0)
MCH: 33.6 pg (ref 26.0–34.0)
MCHC: 33.7 g/dL (ref 30.0–36.0)
MCV: 99.7 fL (ref 78.0–100.0)
MONO ABS: 2.2 10*3/uL — AB (ref 0.1–1.0)
Monocytes Relative: 10 % (ref 3–12)
Neutro Abs: 19.1 10*3/uL — ABNORMAL HIGH (ref 1.7–7.7)
Neutrophils Relative %: 85 % — ABNORMAL HIGH (ref 43–77)
Platelets: 117 10*3/uL — ABNORMAL LOW (ref 150–400)
RBC: 3.07 MIL/uL — ABNORMAL LOW (ref 4.22–5.81)
RDW: 16.9 % — AB (ref 11.5–15.5)
WBC: 22.6 10*3/uL — ABNORMAL HIGH (ref 4.0–10.5)

## 2015-03-04 LAB — COMPREHENSIVE METABOLIC PANEL
ALT: 18 U/L (ref 0–53)
AST: 32 U/L (ref 0–37)
Albumin: 2.9 g/dL — ABNORMAL LOW (ref 3.5–5.2)
Alkaline Phosphatase: 243 U/L — ABNORMAL HIGH (ref 39–117)
Anion gap: 13 (ref 5–15)
BILIRUBIN TOTAL: 0.6 mg/dL (ref 0.3–1.2)
BUN: 11 mg/dL (ref 6–23)
CO2: 23 mmol/L (ref 19–32)
Calcium: 9.3 mg/dL (ref 8.4–10.5)
Chloride: 105 mmol/L (ref 96–112)
Creatinine, Ser: 1.06 mg/dL (ref 0.50–1.35)
GFR calc Af Amer: 79 mL/min — ABNORMAL LOW (ref >90)
GFR calc non Af Amer: 68 mL/min — ABNORMAL LOW (ref >90)
GLUCOSE: 114 mg/dL — AB (ref 70–99)
Potassium: 3.5 mmol/L (ref 3.5–5.1)
Sodium: 141 mmol/L (ref 135–145)
Total Protein: 5.9 g/dL — ABNORMAL LOW (ref 6.0–8.3)

## 2015-03-04 LAB — TROPONIN I
Troponin I: 0.03 ng/mL (ref <0.031)
Troponin I: 0.05 ng/mL — ABNORMAL HIGH (ref <0.031)

## 2015-03-04 LAB — URINE MICROSCOPIC-ADD ON

## 2015-03-04 LAB — LIPASE, BLOOD: Lipase: 13 U/L (ref 11–59)

## 2015-03-04 LAB — BRAIN NATRIURETIC PEPTIDE: B NATRIURETIC PEPTIDE 5: 295.2 pg/mL — AB (ref 0.0–100.0)

## 2015-03-04 LAB — TSH: TSH: 2.552 u[IU]/mL (ref 0.350–4.500)

## 2015-03-04 LAB — MAGNESIUM: Magnesium: 1.1 mg/dL — ABNORMAL LOW (ref 1.5–2.5)

## 2015-03-04 MED ORDER — IOHEXOL 350 MG/ML SOLN
100.0000 mL | Freq: Once | INTRAVENOUS | Status: AC | PRN
  Administered 2015-03-04: 100 mL via INTRAVENOUS

## 2015-03-04 MED ORDER — HEPARIN SOD (PORK) LOCK FLUSH 100 UNIT/ML IV SOLN
500.0000 [IU] | Freq: Once | INTRAVENOUS | Status: AC
  Administered 2015-03-04: 500 [IU]
  Filled 2015-03-04: qty 5

## 2015-03-04 MED ORDER — IOHEXOL 300 MG/ML  SOLN
25.0000 mL | Freq: Once | INTRAMUSCULAR | Status: AC | PRN
  Administered 2015-03-04: 25 mL via ORAL

## 2015-03-04 MED ORDER — SODIUM CHLORIDE 0.9 % IV SOLN
INTRAVENOUS | Status: DC
  Administered 2015-03-04: 15:00:00 via INTRAVENOUS

## 2015-03-04 NOTE — ED Notes (Signed)
CT called to come and get patient.

## 2015-03-04 NOTE — Telephone Encounter (Signed)
Unable to reach pt's wife to let her know we have made pt appt to come in today for his elevated Heart Rate.  Called pt back and he says he has not been able to reach her by cell phone either.  He says he feels ok.  Asked him to call us when he talks to her and knows if he can come in to see Dr. Alvy Bimler today.  He verbalized understanding.

## 2015-03-04 NOTE — Telephone Encounter (Signed)
Wife called to report pt in ED at [facility].  His son had called 35 and pt was in Afib so they took him to [facility] ED.  Asked wife to let us know if he gets admitted or not so we can f/u.  She verbalized understanding. Notified Dr. Alvy Bimler.

## 2015-03-04 NOTE — Discharge Instructions (Signed)
Palpitations °A palpitation is the feeling that your heartbeat is irregular or is faster than normal. It may feel like your heart is fluttering or skipping a beat. Palpitations are usually not a serious problem. However, in some cases, you may need further medical evaluation. °CAUSES  °Palpitations can be caused by: °· Smoking. °· Caffeine or other stimulants, such as diet pills or energy drinks. °· Alcohol. °· Stress and anxiety. °· Strenuous physical activity. °· Fatigue. °· Certain medicines. °· Heart disease, especially if you have a history of irregular heart rhythms (arrhythmias), such as atrial fibrillation, atrial flutter, or supraventricular tachycardia. °· An improperly working pacemaker or defibrillator. °DIAGNOSIS  °To find the cause of your palpitations, your health care provider will take your medical history and perform a physical exam. Your health care provider may also have you take a test called an ambulatory electrocardiogram (ECG). An ECG records your heartbeat patterns over a 24-hour period. You may also have other tests, such as: °· Transthoracic echocardiogram (TTE). During echocardiography, sound waves are used to evaluate how blood flows through your heart. °· Transesophageal echocardiogram (TEE). °· Cardiac monitoring. This allows your health care provider to monitor your heart rate and rhythm in real time. °· Holter monitor. This is a portable device that records your heartbeat and can help diagnose heart arrhythmias. It allows your health care provider to track your heart activity for several days, if needed. °· Stress tests by exercise or by giving medicine that makes the heart beat faster. °TREATMENT  °Treatment of palpitations depends on the cause of your symptoms and can vary greatly. Most cases of palpitations do not require any treatment other than time, relaxation, and monitoring your symptoms. Other causes, such as atrial fibrillation, atrial flutter, or supraventricular  tachycardia, usually require further treatment. °HOME CARE INSTRUCTIONS  °· Avoid: °· Caffeinated coffee, tea, soft drinks, diet pills, and energy drinks. °· Chocolate. °· Alcohol. °· Stop smoking if you smoke. °· Reduce your stress and anxiety. Things that can help you relax include: °· A method of controlling things in your body, such as your heartbeats, with your mind (biofeedback). °· Yoga. °· Meditation. °· Physical activity such as swimming, jogging, or walking. °· Get plenty of rest and sleep. °SEEK MEDICAL CARE IF:  °· You continue to have a fast or irregular heartbeat beyond 24 hours. °· Your palpitations occur more often. °SEEK IMMEDIATE MEDICAL CARE IF: °· You have chest pain or shortness of breath. °· You have a severe headache. °· You feel dizzy or you faint. °MAKE SURE YOU: °· Understand these instructions. °· Will watch your condition. °· Will get help right away if you are not doing well or get worse. °Document Released: 10/30/2000 Document Revised: 11/07/2013 Document Reviewed: 01/01/2012 °ExitCare® Patient Information ©2015 [employer]. This information is not intended to replace advice given to you by your health care provider. Make sure you discuss any questions you have with your health care provider. ° ° °Atrial Fibrillation °Atrial fibrillation is a type of irregular heart rhythm (arrhythmia). During atrial fibrillation, the upper chambers of the heart (atria) quiver continuously in a chaotic pattern. This causes an irregular and often rapid heart rate.  °Atrial fibrillation is the result of the heart becoming overloaded with disorganized signals that tell it to beat. These signals are normally released one at a time by a part of the right atrium called the sinoatrial node. They then travel from the atria to the lower chambers of the heart (ventricles), causing the   atria and ventricles to contract and pump blood as they pass. In atrial fibrillation, parts of the atria outside of the  sinoatrial node also release these signals. This results in two problems. First, the atria receive so many signals that they do not have time to fully contract. Second, the ventricles, which can only receive one signal at a time, beat irregularly and out of rhythm with the atria.  °There are three types of atrial fibrillation:  °· Paroxysmal. Paroxysmal atrial fibrillation starts suddenly and stops on its own within a week. °· Persistent. Persistent atrial fibrillation lasts for more than a week. It may stop on its own or with treatment. °· Permanent. Permanent atrial fibrillation does not go away. Episodes of atrial fibrillation may lead to permanent atrial fibrillation. °Atrial fibrillation can prevent your heart from pumping blood normally. It increases your risk of stroke and can lead to heart failure.  °CAUSES  °· Heart conditions, including a heart attack, heart failure, coronary artery disease, and heart valve conditions.   °· Inflammation of the sac that surrounds the heart (pericarditis). °· Blockage of an artery in the lungs (pulmonary embolism). °· Pneumonia or other infections. °· Chronic lung disease. °· Thyroid problems, especially if the thyroid is overactive (hyperthyroidism). °· Caffeine, excessive alcohol use, and use of some illegal drugs.   °· Use of some medicines, including certain decongestants and diet pills. °· Heart surgery.   °· Birth defects.   °Sometimes, no cause can be found. When this happens, the atrial fibrillation is called lone atrial fibrillation. The risk of complications from atrial fibrillation increases if you have lone atrial fibrillation and you are age 60 years or older. °RISK FACTORS °· Heart failure. °· Coronary artery disease. °· Diabetes mellitus.   °· High blood pressure (hypertension).   °· Obesity.   °· Other arrhythmias.   °· Increased age. °SIGNS AND SYMPTOMS  °· A feeling that your heart is beating rapidly or irregularly.   °· A feeling of discomfort or pain in  your chest.   °· Shortness of breath.   °· Sudden light-headedness or weakness.   °· Getting tired easily when exercising.   °· Urinating more often than normal (mainly when atrial fibrillation first begins).   °In paroxysmal atrial fibrillation, symptoms may start and suddenly stop. °DIAGNOSIS  °Your health care provider may be able to detect atrial fibrillation when taking your pulse. Your health care provider may have you take a test called an ambulatory electrocardiogram (ECG). An ECG records your heartbeat patterns over a 24-hour period. You may also have other tests, such as: °· Transthoracic echocardiogram (TTE). During echocardiography, sound waves are used to evaluate how blood flows through your heart. °· Transesophageal echocardiogram (TEE). °· Stress test. There is more than one type of stress test. If a stress test is needed, ask your health care provider about which type is best for you. °· Chest X-ray exam. °· Blood tests. °· Computed tomography (CT). °TREATMENT  °Treatment may include: °· Treating any underlying conditions. For example, if you have an overactive thyroid, treating the condition may correct atrial fibrillation. °· Taking medicine. Medicines may be given to control a rapid heart rate or to prevent blood clots, heart failure, or a stroke. °· Having a procedure to correct the rhythm of the heart: °¨ Electrical cardioversion. During electrical cardioversion, a controlled, low-energy shock is delivered to the heart through your skin. If you have chest pain, very low blood pressure, or sudden heart failure, this procedure may need to be done as an emergency. °¨ Catheter ablation. During this   procedure, heart tissues that send the signals that cause atrial fibrillation are destroyed. °¨ Surgical ablation. During this surgery, thin lines of heart tissue that carry the abnormal signals are destroyed. This procedure can either be an open-heart surgery or a minimally invasive surgery. With the  minimally invasive surgery, small cuts are made to access the heart instead of a large opening. °¨ Pulmonary venous isolation. During this surgery, tissue around the veins that carry blood from the lungs (pulmonary veins) is destroyed. This tissue is thought to carry the abnormal signals. °HOME CARE INSTRUCTIONS  °· Take medicines only as directed by your health care provider. Some medicines can make atrial fibrillation worse or recur. °· If blood thinners were prescribed by your health care provider, take them exactly as directed. Too much blood-thinning medicine can cause bleeding. If you take too little, you will not have the needed protection against stroke and other problems. °· Perform blood tests at home if directed by your health care provider. Perform blood tests exactly as directed. °· Quit smoking if you smoke. °· Do not drink alcohol. °· Do not drink caffeinated beverages such as coffee, soda, and some teas. You may drink decaffeinated coffee, soda, or tea.   °· Maintain a healthy weight. Do not use diet pills unless your health care provider approves. They may make heart problems worse.   °· Follow diet instructions as directed by your health care provider. °· Exercise regularly as directed by your health care provider. °· Keep all follow-up visits as directed by your health care provider. This is important. °PREVENTION  °The following substances can cause atrial fibrillation to recur:  °· Caffeinated beverages. °· Alcohol. °· Certain medicines, especially those used for breathing problems. °· Certain herbs and herbal medicines, such as those containing ephedra or ginseng. °· Illegal drugs, such as cocaine and amphetamines. °Sometimes medicines are given to prevent atrial fibrillation from recurring. Proper treatment of any underlying condition is also important in helping prevent recurrence.  °SEEK MEDICAL CARE IF: °· You notice a change in the rate, rhythm, or strength of your heartbeat. °· You  suddenly begin urinating more frequently. °· You tire more easily when exerting yourself or exercising. °SEEK IMMEDIATE MEDICAL CARE IF:  °· You have chest pain, abdominal pain, sweating, or weakness. °· You feel nauseous. °· You have shortness of breath. °· You suddenly have swollen feet and ankles. °· You feel dizzy. °· Your face or limbs feel numb or weak. °· You have a change in your vision or speech. °MAKE SURE YOU:  °· Understand these instructions. °· Will watch your condition. °· Will get help right away if you are not doing well or get worse. °Document Released: 11/02/2005 Document Revised: 03/19/2014 Document Reviewed: 12/13/2012 °ExitCare® Patient Information ©2015 [employer]. This information is not intended to replace advice given to you by your health care provider. Make sure you discuss any questions you have with your health care provider. ° °

## 2015-03-04 NOTE — ED Notes (Signed)
Patient taken to CT.

## 2015-03-04 NOTE — Telephone Encounter (Signed)
Received TC from pt's wife. She states  Evan [name] is on his way to [facility] ED with Atrial Fibrillation. She wanted [place] and Dr. Alvy Bimler to be aware.  Notified Dr. Alvy Bimler of this.  Called back to wife and let her know that she is aware of pt's transport to ED.

## 2015-03-04 NOTE — Telephone Encounter (Signed)
Call from [facility] reporting pt's HR at home is 120s to 140s possible AFib.   Dr. Alvy Bimler wants to see pt in office today as soon as he can get here.  I called pt's home and s/w pt.  He denies any chest pains, states he feels ok.  Instructed him to come in for office visit today as soon as possible.  He says wife is not home right now.   I called wife's cell phone and left VM for her to return call explaining pt needs to be seen in our clinic today.

## 2015-03-04 NOTE — ED Provider Notes (Signed)
CSN: [id number]     Arrival date & time 03/04/15  1419 History   First MD Initiated Contact with Patient 03/04/15 1521     Chief Complaint  Patient presents with  . Atrial Fibrillation     (Consider location/radiation/quality/duration/timing/severity/associated sxs/prior Treatment) HPI Asian has pancreatic cancer and undergoes chemotherapy. His last chemotherapy was a week ago. He has been having some generalized weakness since that time. He also describes diarrhea 2-3 episodes per day for the past week. He has not seen any blood in the stool. He has not had any associated abdominal pain or fever. The patient is brought to the emergency department today for reported A. fib prior to arrival. He was getting assessment and treatment by his therapist in his home he was identified to have a rapid heart rate and EMS was contacted. The patient denied himself to be asymptomatic with that. He denies chest pain, palpitations or dyspnea. Reportedly on EMS arrival they identified atrial fibrillation however this converted to sinus rhythm prior to the initiation of an IV or any intervention. He was brought to the emergency department for further assessment. Patient denies positive review of systems except for generalized fatigue which is more pronounced since his last chemotherapy. His family member also identifies loose stool for approximately a week. They reports typically he has constipation but he has been having 2-3 bowel movements per day for the past week. His last hospitalization was approximately a month ago for pneumonia. They report he was on antibiotics up until about 3-4 weeks ago. Past Medical History  Diagnosis Date  . Chronic back pain greater than 3 months duration   . Bulging discs   . Diabetes mellitus     type II; neuropathy;   . Hypertension   . High cholesterol   . Leukocytosis   . GERD (gastroesophageal reflux disease)   . Pancreatitis 2004  . Anxiety   . DDD (degenerative disc  disease) 2005    lumbar spine  . History of smoking 08/01/2012  . Weight loss 08/01/2012  . Cough 08/01/2012  . Left leg pain 08/05/2012  . Chronic pancreatitis   . OSA (obstructive sleep apnea) 2010    does not use CPAP  . Nausea alone 06/13/2014  . Allergy   . Mucositis (ulcerative) due to antineoplastic therapy 07/02/2014  . Constipated 07/16/2014  . Gastric ulcer 08/23/2014  . Hx of radiation therapy 06/25/14-08/06/14    pancreas head, 50.4Gy  . 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 Pancreatic cancer 06/08/2014  . Chest pain 11/05/2014  . Tachycardia 03/04/2015   Past Surgical History  Procedure Laterality Date  . Cholecystectomy      laparoscopic  . Hernia repair      umbilical  . Skin cancer removed  2012    right ear, basal cell carcinoma.   Marland Kitchen Spinal stretching    . Colonoscopy  2012    [facility]  . Eus Left 06/07/2014    Procedure: UPPER ENDOSCOPIC ULTRASOUND (EUS) LINEAR;  Surgeon: Arta Silence, MD;  Location: [facility];  Service: Endoscopy;  Laterality: Left;  . Ercp N/A 06/07/2014    Procedure: ENDOSCOPIC RETROGRADE CHOLANGIOPANCREATOGRAPHY (ERCP);  Surgeon: Arta Silence, MD;  Location: [facility];  Service: Endoscopy;  Laterality: N/A;  . Eye surgery      bilateral cataracts with lens implant  . Cataract extraction Bilateral   . Port acath placement Left 06/14/14  . Portacath placement Left 06/14/2014    Procedure: INSERTION  PORT-A-CATH;  Surgeon: Stark Klein, MD;  Location: [facility];  Service: General;  Laterality: Left;  . Esophagogastroduodenoscopy N/A 08/15/2014    Procedure: ESOPHAGOGASTRODUODENOSCOPY (EGD);  Surgeon: Lear Ng, MD;  Location: [facility];  Service: Endoscopy;  Laterality: N/A;   Family History  Problem Relation Age of Onset  . Heart attack Mother   . Cancer Mother 75    lung  . Heart attack Father   . Stroke Father    History  Substance Use Topics  . Smoking status: Former Smoker -- 1.50  packs/day for 40 years    Quit date: 01/24/2004  . Smokeless tobacco: Never Used  . Alcohol Use: No    Review of Systems 10 Systems reviewed and are negative for acute change except as noted in the HPI.    Allergies  Ace inhibitors and Ciprofloxacin  Home Medications   Prior to Admission medications   Medication Sig Start Date End Date Taking? Authorizing Provider  acetaminophen (TYLENOL) 500 MG tablet Take 1,000 mg by mouth every 6 (six) hours as needed for fever.   Yes Historical Provider, MD  aspirin EC 81 MG tablet Take 81 mg by mouth daily.   Yes Historical Provider, MD  cholecalciferol (VITAMIN D) 1000 UNITS tablet Take 1,000 Units by mouth daily.   Yes Historical Provider, MD  dasatinib (SPRYCEL) 50 MG tablet Take 1 tablet (50 mg total) by mouth daily. 01/25/15  Yes [name], MD  insulin glargine (LANTUS) 100 UNIT/ML injection Inject 26 Units into the skin at bedtime.  08/09/14  Yes Modena Jansky, MD  lactulose (CHRONULAC) 10 GM/15ML solution Take 10 g by mouth 2 (two) times daily as needed for moderate constipation.  06/21/14  Yes Historical Provider, MD  losartan (COZAAR) 50 MG tablet Take 50 mg by mouth daily. 12/06/14  Yes Historical Provider, MD  metFORMIN (GLUCOPHAGE) 1000 MG tablet Take 1,000 mg by mouth 2 (two) times daily with a meal.   Yes Historical Provider, MD  pantoprazole (PROTONIX) 40 MG tablet Take 1 tablet (40 mg total) by mouth daily. 08/23/14  Yes [name], MD  promethazine (PHENERGAN) 25 MG tablet Take 25 mg by mouth every 6 (six) hours as needed for nausea or vomiting (nausea).   Yes Historical Provider, MD  sennosides-docusate sodium (SENOKOT-S) 8.6-50 MG tablet Take 2 tablets by mouth at bedtime.    Yes Historical Provider, MD  simvastatin (ZOCOR) 20 MG tablet Take 20 mg by mouth daily.    Yes Historical Provider, MD  vitamin B-12 (CYANOCOBALAMIN) 1000 MCG tablet Take 1,000 mcg by mouth daily.   Yes Historical Provider, MD  B Complex-C (SUPER B  COMPLEX/VITAMIN C PO) Take 1 tablet by mouth daily.     Historical Provider, MD  [place] [facility] 01/07/15.    Historical Provider, MD   BP 144/76 mmHg  Pulse 85  Temp(Src) 98.9 F (37.2 C) (Oral)  Resp 15  Ht 6\' 2"  (1.88 m)  Wt 222 lb (100.699 kg)  BMI 28.49 kg/m2  SpO2 95% Physical Exam  Constitutional: He is oriented to person, place, and time. He appears well-developed and well-nourished.  HENT:  Head: Normocephalic and atraumatic.  Eyes: EOM are normal. Pupils are equal, round, and reactive to light.  Neck: Neck supple.  Cardiovascular: Normal rate, regular rhythm, normal heart sounds and intact distal pulses.   Pulmonary/Chest: Effort normal and breath sounds normal.  Abdominal: Soft. Bowel sounds are normal. He exhibits no distension. There is no tenderness.  Musculoskeletal: Normal range of motion. He exhibits no edema.  Neurological: He is alert and oriented to person, place, and time. He has normal strength. Coordination normal. GCS eye subscore is 4. GCS verbal subscore is 5. GCS motor subscore is 6.  Skin: Skin is warm, dry and intact.  Psychiatric: He has a normal mood and affect.    ED Course  Procedures (including critical care time) Labs Review Labs Reviewed  CBC WITH DIFFERENTIAL/PLATELET - Abnormal; Notable for the following:    WBC 22.6 (*)    RBC 3.07 (*)    Hemoglobin 10.3 (*)    HCT 30.6 (*)    RDW 16.9 (*)    Platelets 117 (*)    Neutrophils Relative % 85 (*)    Neutro Abs 19.1 (*)    Lymphocytes Relative 5 (*)    Monocytes Absolute 2.2 (*)    All other components within normal limits  COMPREHENSIVE METABOLIC PANEL - Abnormal; Notable for the following:    Glucose, Bld 114 (*)    Total Protein 5.9 (*)    Albumin 2.9 (*)    Alkaline Phosphatase 243 (*)    GFR calc non Af Amer 68 (*)    GFR calc Af Amer 79 (*)    All other components within normal limits  MAGNESIUM - Abnormal; Notable for the following:    Magnesium 1.1 (*)     All other components within normal limits  BRAIN NATRIURETIC PEPTIDE - Abnormal; Notable for the following:    B Natriuretic Peptide 295.2 (*)    All other components within normal limits  TROPONIN I - Abnormal; Notable for the following:    Troponin I 0.05 (*)    All other components within normal limits  URINALYSIS, ROUTINE W REFLEX MICROSCOPIC - Abnormal; Notable for the following:    Color, Urine AMBER (*)    APPearance CLOUDY (*)    Protein, ur 100 (*)    All other components within normal limits  STOOL CULTURE  TROPONIN I  TSH  LIPASE, BLOOD  URINE MICROSCOPIC-ADD ON  T3, FREE  GI PATHOGEN PANEL BY PCR, STOOL    Imaging Review Dg Chest 2 View  03/04/2015   CLINICAL DATA:  The patient called EMS this morning. Complains of fast heart rate. Patient was in a fib but converted to sinus tachycardia without medication. Patient now denies chest pain, nausea, vomiting, or shortness of breath.  EXAM: CHEST  2 VIEW  COMPARISON:  Chest x-ray 01/16/2015  FINDINGS: Left-sided power port tip overlies the level of the superior vena cava.  The heart is enlarged. There is prominence of interstitial markings, likely chronic. There are no focal consolidations. Small bilateral pleural effusions are noted. No pulmonary edema.  IMPRESSION: 1. Cardiomegaly. 2. Improving aeration at the left lung base. 3. Persistent small bilateral pleural effusions.   Electronically Signed   By: Nolon Nations M.D.   On: 03/04/2015 16:37   Ct Angio Chest Pe W/cm &/or Wo Cm  03/04/2015   CLINICAL DATA:  Atrial fibrillation.  Pancreatic cancer.  EXAM: CT ANGIOGRAPHY CHEST  CT ABDOMEN AND PELVIS WITH CONTRAST  TECHNIQUE: Multidetector CT imaging of the chest was performed using the standard protocol during bolus administration of intravenous contrast. Multiplanar CT image reconstructions and MIPs were obtained to evaluate the vascular anatomy. Multidetector CT imaging of the abdomen and pelvis was performed using the  standard protocol during bolus administration of intravenous contrast.  CONTRAST:  13mL OMNIPAQUE IOHEXOL 350 MG/ML SOLN  COMPARISON:  12/26/2014.  12/14/2014.  FINDINGS: CTA CHEST FINDINGS  There are no filling defects in the pulmonary arterial tree to suggest acute pulmonary thromboembolism  Left subclavian vein Port-A-Cath is in place. Tip is at the cavoatrial junction.  Stable coronary artery calcifications. No abnormal mediastinal adenopathy. Calcified mediastinal nodes are noted.  Bilateral pleural effusions left greater than right have increased.  No pneumothorax. Dependent opacities within both lungs likely represent volume loss.  Subpleural nodule in the left lower lobe on image 39 is stable.  No vertebral compression deformity. Degenerative change in the lower cervical spine is noted.  CT ABDOMEN and PELVIS FINDINGS  Liver lesions are again identified. Lateral segment of the left lobe liver lesion on image 22 measures 2.6 cm and previously measured 2.5 cm. Anterior right lobe lesion on image 16 measures 1 point 4 x 1.3 cm and previously measured 2.0 x 2.0 cm several other low liver lesions are present. They are smaller.  Biliary stent is in place. The mass in the body of the pancreas measures 3.0 by 2.2 cm and is stable.  Small amount of free fluid about the liver and spleen. There is stranding throughout the intra abdominal fat. Stable appearance of the kidneys. Stable 1.6 cm right adrenal nodule.  Postcholecystectomy.  Prostate gland is within normal limits.  Bladder is unremarkable.  Degenerative disc disease in the lumbar spine.  Review of the MIP images confirms the above findings.  IMPRESSION: No evidence of acute pulmonary thromboembolism.  Bilateral pleural effusions left greater than right are worse.  Liver lesions are improved.  Stable pancreatic mass and biliary stent.  Stranding throughout the intra-abdominal fat is again noted worrisome for anasarca.   Electronically Signed   By: Marybelle Killings  M.D.   On: 03/04/2015 21:56   Ct Abdomen Pelvis W Contrast  03/04/2015   CLINICAL DATA:  Atrial fibrillation.  Pancreatic cancer.  EXAM: CT ANGIOGRAPHY CHEST  CT ABDOMEN AND PELVIS WITH CONTRAST  TECHNIQUE: Multidetector CT imaging of the chest was performed using the standard protocol during bolus administration of intravenous contrast. Multiplanar CT image reconstructions and MIPs were obtained to evaluate the vascular anatomy. Multidetector CT imaging of the abdomen and pelvis was performed using the standard protocol during bolus administration of intravenous contrast.  CONTRAST:  13mL OMNIPAQUE IOHEXOL 350 MG/ML SOLN  COMPARISON:  12/26/2014.  12/14/2014.  FINDINGS: CTA CHEST FINDINGS  There are no filling defects in the pulmonary arterial tree to suggest acute pulmonary thromboembolism  Left subclavian vein Port-A-Cath is in place. Tip is at the cavoatrial junction.  Stable coronary artery calcifications. No abnormal mediastinal adenopathy. Calcified mediastinal nodes are noted.  Bilateral pleural effusions left greater than right have increased.  No pneumothorax. Dependent opacities within both lungs likely represent volume loss.  Subpleural nodule in the left lower lobe on image 39 is stable.  No vertebral compression deformity. Degenerative change in the lower cervical spine is noted.  CT ABDOMEN and PELVIS FINDINGS  Liver lesions are again identified. Lateral segment of the left lobe liver lesion on image 22 measures 2.6 cm and previously measured 2.5 cm. Anterior right lobe lesion on image 16 measures 1 point 4 x 1.3 cm and previously measured 2.0 x 2.0 cm several other low liver lesions are present. They are smaller.  Biliary stent is in place. The mass in the body of the pancreas measures 3.0 by 2.2 cm and is stable.  Small amount of free fluid about the liver and spleen. There  is stranding throughout the intra abdominal fat. Stable appearance of the kidneys. Stable 1.6 cm right adrenal nodule.   Postcholecystectomy.  Prostate gland is within normal limits.  Bladder is unremarkable.  Degenerative disc disease in the lumbar spine.  Review of the MIP images confirms the above findings.  IMPRESSION: No evidence of acute pulmonary thromboembolism.  Bilateral pleural effusions left greater than right are worse.  Liver lesions are improved.  Stable pancreatic mass and biliary stent.  Stranding throughout the intra-abdominal fat is again noted worrisome for anasarca.   Electronically Signed   By: Marybelle Killings M.D.   On: 03/04/2015 21:56     EKG Interpretation   Date/Time:  Monday March 04 2015 15:49:52 EDT Ventricular Rate:  89 PR Interval:  162 QRS Duration: 79 QT Interval:  353 QTC Calculation: 429 R Axis:   85 Text Interpretation:  Sinus rhythm Borderline right axis deviation  Borderline low voltage, extremity leads agree. no acute isschemic  appearance Confirmed by Johnney Killian, MD, Jeannie Done [phone]) on 03/04/2015 3:55:52  PM     Consult: Patient's case consult cardiology. He will be contacted for a follow-up appointment. Consult: Patient's case reviewed with Dr. Benay Spice. At this time based on CT results patient can call Dr. Simeon Craft such in the morning to schedule follow-up this week. MDM   Final diagnoses:  Paroxysmal tachycardia  Malignant neoplasm of pancreas, unspecified location of malignancy   Patient's initial presentation was relating to a tachycardia identified this morning. At this time there are no rhythm strips confirming atrial fibrillation. The patient will be following up with cardiology for further evaluation. He has remained in sinus rhythm throughout his stay in the emergency department. Vital signs have been stable. Due to the report of continued fatigue and malaise postchemotherapy and Neulasta the patient had additional evaluation and CT chest abdomen which did not show any increased tumor burden however some increase in pre-existing pleural effusions and intra-abdominal  anasarca. The patient does not show any signs of having an acute infectious etiology. His case was reviewed with Dr. Benay Spice of oncology and he will follow-up with Dr. Simeon Craft such this week for reassessment.    Charlesetta Shanks, MD 03/04/15 [phone]

## 2015-03-04 NOTE — Telephone Encounter (Signed)
added appt per staff....per staff desk nurse will notify pt.

## 2015-03-04 NOTE — ED Notes (Addendum)
Pt brought to ED via EMS.  Pt called EMS this morning w/ c/o of fast HR.  Per EMS when they got pt, he was in Afib but converted to sinus tach w/o the use of medication. Per EMS pt denies chest pain, n/v, and SOB.  Per EMS pt states that his PT came today and noted a high HR. Pt was at rest when EMS came in.

## 2015-03-04 NOTE — ED Notes (Signed)
Pt unable to give stool sample

## 2015-03-04 NOTE — ED Notes (Signed)
Patient transported to X-ray 

## 2015-03-04 NOTE — ED Notes (Signed)
MD Pfeiffer at the bedside 

## 2015-03-04 NOTE — ED Notes (Addendum)
CT called and notified pt is ready.

## 2015-03-04 NOTE — ED Notes (Signed)
Pt aware of urine sample needed. Pt unable to go at this time. 

## 2015-03-04 NOTE — Telephone Encounter (Signed)
TC from [place] PT with [facility]. He states pt's heart is elevated to 120-140, resting heart rate is 132 with BP of 120/64.  Pt c/o dizzyness. Denies SOB, chest pain, syncopy.  Pt's baseline HR at this office has been in the upper 80's. Merrilee Seashore states his HR was 21 last Friday, 03/01/15. Spoke with [place], Dr. Alvy Bimler RN and pt to come in to see Dr. Alvy Bimler this afternoon.

## 2015-03-05 ENCOUNTER — Telehealth: Payer: Self-pay | Admitting: Hematology and Oncology

## 2015-03-05 ENCOUNTER — Other Ambulatory Visit: Payer: Self-pay | Admitting: Hematology and Oncology

## 2015-03-05 ENCOUNTER — Telehealth: Payer: Self-pay | Admitting: *Deleted

## 2015-03-05 DIAGNOSIS — R0789 Other chest pain: Secondary | ICD-10-CM

## 2015-03-05 LAB — T3, FREE: T3 FREE: 2.1 pg/mL (ref 2.0–4.4)

## 2015-03-05 NOTE — Telephone Encounter (Signed)
Informed wife of appt tomorrow for lab and MD to be here at 12 noon.  CT scan for Friday canceled since it was done in ED last night.  Wife verbalized understanding of appts tomorrow.  POF sent to scheduler.

## 2015-03-05 NOTE — Telephone Encounter (Signed)
NOTIFIED DR.GORSUCH'S NURSE, CAMEO [facility]. SHE IS AWARE TO CANCEL PT.'S CHEST CT SCAN AND WILL CALL PT.'S WIFE WITH INSTRUCTIONS FROM DR.Montrose.

## 2015-03-05 NOTE — Telephone Encounter (Signed)
added appt per pof....per pof pt aware...emailed AM to adjust MD visit appt to 12:30pm

## 2015-03-06 ENCOUNTER — Encounter: Payer: Self-pay | Admitting: Hematology and Oncology

## 2015-03-06 ENCOUNTER — Encounter: Payer: Self-pay | Admitting: *Deleted

## 2015-03-06 ENCOUNTER — Ambulatory Visit (HOSPITAL_BASED_OUTPATIENT_CLINIC_OR_DEPARTMENT_OTHER): Payer: Medicare Other | Admitting: Hematology and Oncology

## 2015-03-06 ENCOUNTER — Ambulatory Visit: Payer: Medicare Other

## 2015-03-06 ENCOUNTER — Ambulatory Visit (HOSPITAL_BASED_OUTPATIENT_CLINIC_OR_DEPARTMENT_OTHER): Payer: Medicare Other

## 2015-03-06 ENCOUNTER — Other Ambulatory Visit: Payer: Medicare Other

## 2015-03-06 DIAGNOSIS — R0602 Shortness of breath: Secondary | ICD-10-CM

## 2015-03-06 DIAGNOSIS — D630 Anemia in neoplastic disease: Secondary | ICD-10-CM | POA: Diagnosis not present

## 2015-03-06 DIAGNOSIS — T50905A Adverse effect of unspecified drugs, medicaments and biological substances, initial encounter: Secondary | ICD-10-CM

## 2015-03-06 DIAGNOSIS — D6959 Other secondary thrombocytopenia: Secondary | ICD-10-CM

## 2015-03-06 DIAGNOSIS — C250 Malignant neoplasm of head of pancreas: Secondary | ICD-10-CM | POA: Diagnosis present

## 2015-03-06 DIAGNOSIS — I480 Paroxysmal atrial fibrillation: Secondary | ICD-10-CM

## 2015-03-06 DIAGNOSIS — R000 Tachycardia, unspecified: Secondary | ICD-10-CM

## 2015-03-06 LAB — CBC WITH DIFFERENTIAL/PLATELET
BASO%: 0.2 % (ref 0.0–2.0)
Basophils Absolute: 0 10*3/uL (ref 0.0–0.1)
EOS%: 0.6 % (ref 0.0–7.0)
Eosinophils Absolute: 0.1 10*3/uL (ref 0.0–0.5)
HCT: 33.4 % — ABNORMAL LOW (ref 38.4–49.9)
HEMOGLOBIN: 11 g/dL — AB (ref 13.0–17.1)
LYMPH%: 5.4 % — AB (ref 14.0–49.0)
MCH: 33.1 pg (ref 27.2–33.4)
MCHC: 32.9 g/dL (ref 32.0–36.0)
MCV: 100.6 fL — AB (ref 79.3–98.0)
MONO#: 1.9 10*3/uL — ABNORMAL HIGH (ref 0.1–0.9)
MONO%: 7.5 % (ref 0.0–14.0)
NEUT#: 21.6 10*3/uL — ABNORMAL HIGH (ref 1.5–6.5)
NEUT%: 86.3 % — ABNORMAL HIGH (ref 39.0–75.0)
PLATELETS: 122 10*3/uL — AB (ref 140–400)
RBC: 3.32 10*6/uL — AB (ref 4.20–5.82)
RDW: 17 % — AB (ref 11.0–14.6)
WBC: 25 10*3/uL — ABNORMAL HIGH (ref 4.0–10.3)
lymph#: 1.3 10*3/uL (ref 0.9–3.3)

## 2015-03-06 LAB — COMPREHENSIVE METABOLIC PANEL (CC13)
ALT: 21 U/L (ref 0–55)
ANION GAP: 14 meq/L — AB (ref 3–11)
AST: 31 U/L (ref 5–34)
Albumin: 3.4 g/dL — ABNORMAL LOW (ref 3.5–5.0)
Alkaline Phosphatase: 338 U/L — ABNORMAL HIGH (ref 40–150)
BILIRUBIN TOTAL: 0.48 mg/dL (ref 0.20–1.20)
BUN: 8.8 mg/dL (ref 7.0–26.0)
CO2: 22 meq/L (ref 22–29)
CREATININE: 1 mg/dL (ref 0.7–1.3)
Calcium: 9.8 mg/dL (ref 8.4–10.4)
Chloride: 106 mEq/L (ref 98–109)
EGFR: 79 mL/min/{1.73_m2} — AB (ref >90)
GLUCOSE: 146 mg/dL — AB (ref 70–140)
Potassium: 3.8 mEq/L (ref 3.5–5.1)
Sodium: 141 mEq/L (ref 136–145)
Total Protein: 6.7 g/dL (ref 6.4–8.3)

## 2015-03-06 MED ORDER — MAGNESIUM OXIDE 400 MG PO TABS: 400.0000 mg | ORAL_TABLET | Freq: Every day | ORAL | Status: DC

## 2015-03-06 MED ORDER — SODIUM CHLORIDE 0.9 % IJ SOLN
10.0000 mL | INTRAMUSCULAR | Status: DC | PRN
  Filled 2015-03-06: qty 10

## 2015-03-06 MED ORDER — HEPARIN SOD (PORK) LOCK FLUSH 100 UNIT/ML IV SOLN
500.0000 [IU] | Freq: Once | INTRAVENOUS | Status: DC
  Filled 2015-03-06: qty 5

## 2015-03-06 NOTE — Progress Notes (Signed)
Patient in for Port-A-Cath access and labs today. Patient states, "I didn't know that I was going to have labs from my port today. I did not put any cream on it. I want Doug to draw my blood from my arm." Phlebotomy Supervisor, C.L. Hickerson made aware. Patient's wife stated, "He was seen in the ED on Monday and had labs drawn then. He may not need labs today." Dr. Calton Dach nurse Ricarda Frame, RN made aware. Per Ricarda Frame, RN Patient will be seen by Dr. Alvy Bimler before labs.

## 2015-03-06 NOTE — Patient Instructions (Signed)

## 2015-03-07 ENCOUNTER — Telehealth: Payer: Self-pay | Admitting: *Deleted

## 2015-03-07 ENCOUNTER — Other Ambulatory Visit: Payer: Self-pay | Admitting: *Deleted

## 2015-03-07 ENCOUNTER — Other Ambulatory Visit: Payer: Self-pay | Admitting: Hematology and Oncology

## 2015-03-07 DIAGNOSIS — C250 Malignant neoplasm of head of pancreas: Secondary | ICD-10-CM

## 2015-03-07 DIAGNOSIS — R0602 Shortness of breath: Secondary | ICD-10-CM

## 2015-03-07 DIAGNOSIS — R000 Tachycardia, unspecified: Secondary | ICD-10-CM

## 2015-03-07 DIAGNOSIS — I480 Paroxysmal atrial fibrillation: Secondary | ICD-10-CM | POA: Insufficient documentation

## 2015-03-07 LAB — TROPONIN I: TROPONIN I: 0.01 ng/mL (ref <0.06)

## 2015-03-07 LAB — BRAIN NATRIURETIC PEPTIDE: BRAIN NATRIURETIC PEPTIDE: 89.1 pg/mL (ref 0.0–100.0)

## 2015-03-07 NOTE — Telephone Encounter (Signed)
Notified pt of Troponin wnl.  He verbalized understanding.

## 2015-03-07 NOTE — Progress Notes (Signed)
[place] OFFICE PROGRESS NOTE  Patient Care Team: Lavone Orn, MD as PCP - General (Internal Medicine) [name], MD as Referring Physician (Specialist) Provider Not In System [name], MD as Consulting Physician (Hematology and Oncology)  SUMMARY OF ONCOLOGIC HISTORY: Oncology History   Pancreatic cancer   Primary site: Pancreas   Staging method: AJCC 7th Edition   Clinical: Stage IIA (T3, N0, M0) signed by [name], MD on 06/13/2014  4:37 PM   Summary: Stage IIA (T3, N0, M0)       Malignant neoplasm of head of pancreas   06/05/2014 Imaging  MRI abdomen showed 2.2 cm lesion in the pancreatic head obstructing the common bile duct and the pancreatic duct is highly concerning for pancreatic adenocarcinoma.  There is moderate intra and extrahepatic biliary duct dilatation.    06/05/2014 Tumor Marker CA 19-9 is elevated at 1234   06/06/2014 Imaging Ct scan of chest showed 2 lung nodules, likely benign   06/07/2014 Procedure He had ERCP and stent placement   06/07/2014 Procedure He had endoscopic ultrasound with  fine needle aspiration.   06/07/2014 Pathology Results Accession: [id number] FNA is positive for pancreatic adenoca   06/14/2014 Surgery He has port placement   06/25/2014 - 08/01/2014 Chemotherapy He received neoadjuvant chemotherapy with Gemzar   06/25/2014 - 08/06/2014 Radiation Therapy he received neoadjuvant radiation: 50.4 Gy   08/13/2014 - 08/23/2014 Hospital Admission The patient was admitted to the hospital related to uncontrolled nausea, vomiting and severe epigastric pain and was found to have significant gastric ulcer, resolved with conservative management and total parenteral nutrition   09/06/2014 Imaging Repeat CT scan show enlarging lung nodule, suspicious for early metastatic disease.   10/18/2014 Imaging CT scan of the chest, abdomen and pelvis show metastatic disease in the lung and liver.   10/18/2014 Tumor Marker CA-19-9 at 1745   10/22/2014 - 12/03/2014  Chemotherapy The patient is placed on modified FOLFOX chemotherapy   11/05/2014 Tumor Marker Ca19-9 at 2447   12/14/2014 Imaging CT scan showed disease progression in the liver   12/14/2014 Tumor Marker CA 19-9 at 2050   12/17/2014 -  Chemotherapy the patient was starting on Abraxane with reduced dose Gemzar   12/24/2014 Tumor Marker CA-19-9 is at 1387.   12/26/2014 - 12/29/2014 Hospital Admission the patient was admitted to the hospital for hospital-acquired pneumonia.   01/07/2015 Adverse Reaction cycle 2 of treatment with reduced dose Abraxane due to recent hospitalization   01/12/2015 - 01/16/2015 Hospital Admission  the patient was admitted to the hospital again for hospital acquired pneumonia.   03/04/2015 Imaging Repeat CT scans show improvement of disease control.    INTERVAL HISTORY: Please see below for problem oriented charting. He returns for further follow-up to review recent test results. He denies further tachycardia, dizziness, chest pain or lightheadedness. He tolerated treatment very well. Continues with mild peripheral neuropathy, not worse  REVIEW OF SYSTEMS:   Constitutional: Denies fevers, chills or abnormal weight loss Eyes: Denies blurriness of vision Ears, nose, mouth, throat, and face: Denies mucositis or sore throat Respiratory: Denies cough, dyspnea or wheezes Cardiovascular: Denies palpitation, chest discomfort or lower extremity swelling Gastrointestinal:  Denies nausea, heartburn or change in bowel habits Skin: Denies abnormal skin rashes Lymphatics: Denies new lymphadenopathy or easy bruising Neurological:Denies numbness, tingling or new weaknesses Behavioral/Psych: Mood is stable, no new changes  All other systems were reviewed with the patient and are negative.  I have reviewed the past medical history, past surgical history,  social history and family history with the patient and they are unchanged from previous note.  ALLERGIES:  is allergic to ace inhibitors  and ciprofloxacin.  MEDICATIONS:  Current Outpatient Prescriptions  Medication Sig Dispense Refill  . acetaminophen (TYLENOL) 500 MG tablet Take 1,000 mg by mouth every 6 (six) hours as needed for fever.    Marland Kitchen aspirin EC 81 MG tablet Take 81 mg by mouth daily.    . B Complex-C (SUPER B COMPLEX/VITAMIN C PO) Take 1 tablet by mouth daily.     . cholecalciferol (VITAMIN D) 1000 UNITS tablet Take 1,000 Units by mouth daily.    . dasatinib (SPRYCEL) 50 MG tablet Take 1 tablet (50 mg total) by mouth daily. 30 tablet 2  . insulin glargine (LANTUS) 100 UNIT/ML injection Inject 26 Units into the skin at bedtime.     Marland Kitchen lactulose (CHRONULAC) 10 GM/15ML solution Take 10 g by mouth 2 (two) times daily as needed for moderate constipation.     Marland Kitchen losartan (COZAAR) 50 MG tablet Take 50 mg by mouth daily.  11  . metFORMIN (GLUCOPHAGE) 1000 MG tablet Take 1,000 mg by mouth 2 (two) times daily with a meal.    . pantoprazole (PROTONIX) 40 MG tablet Take 1 tablet (40 mg total) by mouth daily. 60 tablet 6  . PRESCRIPTION MEDICATION Chemo at [facility] 01/07/15.    Marland Kitchen promethazine (PHENERGAN) 25 MG tablet Take 25 mg by mouth every 6 (six) hours as needed for nausea or vomiting (nausea).    . sennosides-docusate sodium (SENOKOT-S) 8.6-50 MG tablet Take 2 tablets by mouth at bedtime.     . simvastatin (ZOCOR) 20 MG tablet Take 20 mg by mouth daily.     . vitamin B-12 (CYANOCOBALAMIN) 1000 MCG tablet Take 1,000 mcg by mouth daily.    . magnesium oxide (MAG-OX) 400 MG tablet Take 1 tablet (400 mg total) by mouth daily. 30 tablet 3   No current facility-administered medications for this visit.    PHYSICAL EXAMINATION: ECOG PERFORMANCE STATUS: 0 - Asymptomatic  Filed Vitals:   03/06/15 1211  BP: 145/67  Pulse: 78  Temp: 98.1 F (36.7 C)  Resp: 19   Filed Weights   03/06/15 1211  Weight: 227 lb 8 oz (103.193 kg)    GENERAL:alert, no distress and comfortable SKIN: skin color, texture, turgor are normal, no rashes  or significant lesions EYES: normal, Conjunctiva are pink and non-injected, sclera clear OROPHARYNX:no exudate, no erythema and lips, buccal mucosa, and tongue normal  NECK: supple, thyroid normal size, non-tender, without nodularity LYMPH:  no palpable lymphadenopathy in the cervical, axillary or inguinal LUNGS: clear to auscultation and percussion with normal breathing effort HEART: regular rate & rhythm and no murmurs and no lower extremity edema ABDOMEN:abdomen soft, non-tender and normal bowel sounds Musculoskeletal:no cyanosis of digits and no clubbing  NEURO: alert & oriented x 3 with fluent speech, no focal motor/sensory deficits  LABORATORY DATA:  I have reviewed the data as listed    Component Value Date/Time   NA 141 03/06/2015 1147   NA 141 03/04/2015 1509   K 3.8 03/06/2015 1147   K 3.5 03/04/2015 1509   CL 105 03/04/2015 1509   CL 107 04/27/2013 0926   CO2 22 03/06/2015 1147   CO2 23 03/04/2015 1509   GLUCOSE 146* 03/06/2015 1147   GLUCOSE 114* 03/04/2015 1509   GLUCOSE 212* 04/27/2013 0926   BUN 8.8 03/06/2015 1147   BUN 11 03/04/2015 1509   CREATININE 1.0 03/06/2015  1147   CREATININE 1.06 03/04/2015 1509   CALCIUM 9.8 03/06/2015 1147   CALCIUM 9.3 03/04/2015 1509   PROT 6.7 03/06/2015 1147   PROT 5.9* 03/04/2015 1509   ALBUMIN 3.4* 03/06/2015 1147   ALBUMIN 2.9* 03/04/2015 1509   AST 31 03/06/2015 1147   AST 32 03/04/2015 1509   ALT 21 03/06/2015 1147   ALT 18 03/04/2015 1509   ALKPHOS 338* 03/06/2015 1147   ALKPHOS 243* 03/04/2015 1509   BILITOT 0.48 03/06/2015 1147   BILITOT 0.6 03/04/2015 1509   GFRNONAA 68* 03/04/2015 1509   GFRAA 79* 03/04/2015 1509    No results found for: SPEP, UPEP  Lab Results  Component Value Date   WBC 25.0* 03/06/2015   NEUTROABS 21.6* 03/06/2015   HGB 11.0* 03/06/2015   HCT 33.4* 03/06/2015   MCV 100.6* 03/06/2015   PLT 122* 03/06/2015      Chemistry      Component Value Date/Time   NA 141 03/06/2015 1147    NA 141 03/04/2015 1509   K 3.8 03/06/2015 1147   K 3.5 03/04/2015 1509   CL 105 03/04/2015 1509   CL 107 04/27/2013 0926   CO2 22 03/06/2015 1147   CO2 23 03/04/2015 1509   BUN 8.8 03/06/2015 1147   BUN 11 03/04/2015 1509   CREATININE 1.0 03/06/2015 1147   CREATININE 1.06 03/04/2015 1509      Component Value Date/Time   CALCIUM 9.8 03/06/2015 1147   CALCIUM 9.3 03/04/2015 1509   ALKPHOS 338* 03/06/2015 1147   ALKPHOS 243* 03/04/2015 1509   AST 31 03/06/2015 1147   AST 32 03/04/2015 1509   ALT 21 03/06/2015 1147   ALT 18 03/04/2015 1509   BILITOT 0.48 03/06/2015 1147   BILITOT 0.6 03/04/2015 1509       RADIOGRAPHIC STUDIES: I reviewed recent CT imaging study with the patient and his wife I have personally reviewed the radiological images as listed and agreed with the findings in the report.   ASSESSMENT & PLAN:  Malignant neoplasm of head of pancreas After I reduced the dose of chemotherapy with Abraxane 20% along with reduced dose Gemzar by 50% plus G-CSF support with each cycle,  He tolerated that better. Repeat CT scan show reduction in the size of cancer So far, he is encouraged to see that the tumor markers are trending down. I will recommend a few more cycles of treatment and repeat imaging in 3 months    Anemia in neoplastic disease This is likely due to recent treatment. The patient denies recent history of bleeding such as epistaxis, hematuria or hematochezia. [name] I will observe for now.  He does not require transfusion now.      Thrombocytopenia due to drugs This is likely due to recent treatment. The patient denies recent history of bleeding such as epistaxis, hematuria or hematochezia. He is asymptomatic from the low platelet count. I will observe for now.  he does not require transfusion now. I will continue the chemotherapy at current dose without dosage adjustment.  If the thrombocytopenia gets progressive worse in the  future, I might have to delay his treatment or adjust the chemotherapy dose.     Paroxysmal a-fib He has paroxsymal atrial fibrillation recently. EMT documented atrial fibrillation but EKG at the emergency department showed normal sinus rhythm. BNP was mildly elevated in the ER but he has resolved back to normal. Troponin level is still pending and the patient is asymptomatic. He has  cardiology appointment pending. I recommend close observation for now but no change in management. Of note, recent thyroid function tests were within normal limits    No orders of the defined types were placed in this encounter.   All questions were answered. The patient knows to call the clinic with any problems, questions or concerns. No barriers to learning was detected. I spent 25 minutes counseling the patient face to face. The total time spent in the appointment was 30 minutes and more than 50% was on counseling and review of test results     [facility], Daire Okimoto, MD 03/07/2015 11:20 AM

## 2015-03-07 NOTE — Assessment & Plan Note (Signed)
This is likely due to recent treatment. The patient denies recent history of bleeding such as epistaxis, hematuria or hematochezia. He is asymptomatic from the low platelet count. I will observe for now.  he does not require transfusion now. I will continue the chemotherapy at current dose without dosage adjustment.  If the thrombocytopenia gets progressive worse in the future, I might have to delay his treatment or adjust the chemotherapy dose.   

## 2015-03-07 NOTE — Assessment & Plan Note (Signed)
He has paroxsymal atrial fibrillation recently. EMT documented atrial fibrillation but EKG at the emergency department showed normal sinus rhythm. BNP was mildly elevated in the ER but he has resolved back to normal. Troponin level is still pending and the patient is asymptomatic. He has cardiology appointment pending. I recommend close observation for now but no change in management. Of note, recent thyroid function tests were within normal limits

## 2015-03-07 NOTE — Assessment & Plan Note (Signed)
After I reduced the dose of chemotherapy with Abraxane 20% along with reduced dose Gemzar by 50% plus G-CSF support with each cycle,  He tolerated that better. Repeat CT scan show reduction in the size of cancer So far, he is encouraged to see that the tumor markers are trending down. I will recommend a few more cycles of treatment and repeat imaging in 3 months

## 2015-03-07 NOTE — Telephone Encounter (Signed)
-----   Message from [name], MD sent at 03/07/2015  1:14 PM EDT ----- Regarding: normal troponin pls reassure his wife it's now normal ----- Message -----    From: [facility]    Sent: 03/06/2015   1:10 PM      To: [name], MD

## 2015-03-07 NOTE — Assessment & Plan Note (Signed)
This is likely due to recent treatment. The patient denies recent history of bleeding such as epistaxis, hematuria or hematochezia. [name] I will observe for now.  He does not require transfusion now. 

## 2015-03-07 NOTE — Telephone Encounter (Signed)
Left VM for pt/wife informing Dr. Alvy Bimler says pt's labs including BNP look good and pt does not need to have labs repeated on Monday 4/25 prior to chemo appt.  He can just show up for chemo at 10 am as scheduled.  Also informed that Dr. Alvy Bimler doesn't think pt's cardiology appt needs to be moved up, ok to leave as scheduled. Asked wife or pt to call us back if any questions or concerns.

## 2015-03-08 ENCOUNTER — Other Ambulatory Visit: Payer: Medicare Other

## 2015-03-08 ENCOUNTER — Ambulatory Visit (HOSPITAL_COMMUNITY): Payer: Medicare Other

## 2015-03-11 ENCOUNTER — Other Ambulatory Visit: Payer: Medicare Other

## 2015-03-11 ENCOUNTER — Ambulatory Visit: Payer: Medicare Other | Admitting: Hematology and Oncology

## 2015-03-11 ENCOUNTER — Ambulatory Visit (HOSPITAL_BASED_OUTPATIENT_CLINIC_OR_DEPARTMENT_OTHER): Payer: Medicare Other

## 2015-03-11 VITALS — BP 155/71 | HR 92 | Temp 98.2°F | Resp 19

## 2015-03-11 DIAGNOSIS — C250 Malignant neoplasm of head of pancreas: Secondary | ICD-10-CM

## 2015-03-11 DIAGNOSIS — Z5111 Encounter for antineoplastic chemotherapy: Secondary | ICD-10-CM | POA: Diagnosis present

## 2015-03-11 MED ORDER — PACLITAXEL PROTEIN-BOUND CHEMO INJECTION 100 MG
80.0000 mg/m2 | Freq: Once | INTRAVENOUS | Status: AC
  Administered 2015-03-11: 175 mg via INTRAVENOUS
  Filled 2015-03-11: qty 35

## 2015-03-11 MED ORDER — SODIUM CHLORIDE 0.9 % IV SOLN
Freq: Once | INTRAVENOUS | Status: AC
  Administered 2015-03-11: 10:00:00 via INTRAVENOUS

## 2015-03-11 MED ORDER — HEPARIN SOD (PORK) LOCK FLUSH 100 UNIT/ML IV SOLN
500.0000 [IU] | Freq: Once | INTRAVENOUS | Status: AC | PRN
  Administered 2015-03-11: 500 [IU]
  Filled 2015-03-11: qty 5

## 2015-03-11 MED ORDER — GEMCITABINE HCL CHEMO INJECTION 1 GM/26.3ML
500.0000 mg/m2 | Freq: Once | INTRAVENOUS | Status: AC
  Administered 2015-03-11: 1140 mg via INTRAVENOUS
  Filled 2015-03-11: qty 29.98

## 2015-03-11 MED ORDER — SODIUM CHLORIDE 0.9 % IJ SOLN
10.0000 mL | INTRAMUSCULAR | Status: DC | PRN
  Administered 2015-03-11: 10 mL
  Filled 2015-03-11: qty 10

## 2015-03-11 MED ORDER — SODIUM CHLORIDE 0.9 % IV SOLN
Freq: Once | INTRAVENOUS | Status: AC
  Administered 2015-03-11: 10:00:00 via INTRAVENOUS
  Filled 2015-03-11: qty 4

## 2015-03-11 NOTE — Patient Instructions (Addendum)
[facility] Discharge Instructions for Patients Receiving Chemotherapy  Today you received the following chemotherapy agents:  Abraxane and Gemzar.  To help prevent nausea and vomiting after your treatment, we encourage you to take your nausea medication as prescribed.   If you develop nausea and vomiting that is not controlled by your nausea medication, call the clinic.   BELOW ARE SYMPTOMS THAT SHOULD BE REPORTED IMMEDIATELY:  *FEVER GREATER THAN 100.5 F  *CHILLS WITH OR WITHOUT FEVER  NAUSEA AND VOMITING THAT IS NOT CONTROLLED WITH YOUR NAUSEA MEDICATION  *UNUSUAL SHORTNESS OF BREATH  *UNUSUAL BRUISING OR BLEEDING  TENDERNESS IN MOUTH AND THROAT WITH OR WITHOUT PRESENCE OF ULCERS  *URINARY PROBLEMS  *BOWEL PROBLEMS  UNUSUAL RASH Items with * indicate a potential emergency and should be followed up as soon as possible.  Feel free to call the clinic you have any questions or concerns. The clinic phone number is [phone].  Please show the CHEMO ALERT CARD at check-in to the Emergency Department and triage nurse.   

## 2015-03-14 ENCOUNTER — Telehealth: Payer: Self-pay | Admitting: *Deleted

## 2015-03-14 NOTE — Telephone Encounter (Signed)
Patient's wife called and left a voice message stating," I wanted to ask Dr. Alvy Bimler about his Neulasta injection. Instead of coming back to the cancer center the day after chemotherapy for the injection,could he get the Neulasta On Pro? Return number is [phone].

## 2015-03-14 NOTE — Telephone Encounter (Signed)
Wife notified that Dr Alvy Bimler will order On Pro.

## 2015-03-14 NOTE — Telephone Encounter (Signed)
If she wants OBI Neulasta, I can order that. Will need precert but should not be a problem. Please call her back to confirm and let me know and I will write the order

## 2015-03-18 ENCOUNTER — Other Ambulatory Visit (HOSPITAL_BASED_OUTPATIENT_CLINIC_OR_DEPARTMENT_OTHER): Payer: Medicare Other

## 2015-03-18 ENCOUNTER — Other Ambulatory Visit: Payer: Self-pay | Admitting: Hematology and Oncology

## 2015-03-18 ENCOUNTER — Ambulatory Visit: Payer: Medicare Other

## 2015-03-18 ENCOUNTER — Ambulatory Visit (HOSPITAL_BASED_OUTPATIENT_CLINIC_OR_DEPARTMENT_OTHER): Payer: Medicare Other

## 2015-03-18 VITALS — BP 134/62 | HR 74 | Temp 98.4°F

## 2015-03-18 DIAGNOSIS — Z5111 Encounter for antineoplastic chemotherapy: Secondary | ICD-10-CM

## 2015-03-18 DIAGNOSIS — C250 Malignant neoplasm of head of pancreas: Secondary | ICD-10-CM | POA: Diagnosis present

## 2015-03-18 DIAGNOSIS — Z95828 Presence of other vascular implants and grafts: Secondary | ICD-10-CM

## 2015-03-18 LAB — CBC WITH DIFFERENTIAL/PLATELET
BASO%: 1.2 % (ref 0.0–2.0)
Basophils Absolute: 0.1 10*3/uL (ref 0.0–0.1)
EOS%: 1.3 % (ref 0.0–7.0)
Eosinophils Absolute: 0.1 10*3/uL (ref 0.0–0.5)
HCT: 31.2 % — ABNORMAL LOW (ref 38.4–49.9)
HGB: 10.3 g/dL — ABNORMAL LOW (ref 13.0–17.1)
LYMPH%: 11.6 % — ABNORMAL LOW (ref 14.0–49.0)
MCH: 33.1 pg (ref 27.2–33.4)
MCHC: 33 g/dL (ref 32.0–36.0)
MCV: 100.4 fL — AB (ref 79.3–98.0)
MONO#: 0.7 10*3/uL (ref 0.1–0.9)
MONO%: 9.5 % (ref 0.0–14.0)
NEUT%: 76.4 % — ABNORMAL HIGH (ref 39.0–75.0)
NEUTROS ABS: 5.3 10*3/uL (ref 1.5–6.5)
PLATELETS: 189 10*3/uL (ref 140–400)
RBC: 3.1 10*6/uL — ABNORMAL LOW (ref 4.20–5.82)
RDW: 16.9 % — ABNORMAL HIGH (ref 11.0–14.6)
WBC: 6.9 10*3/uL (ref 4.0–10.3)
lymph#: 0.8 10*3/uL — ABNORMAL LOW (ref 0.9–3.3)

## 2015-03-18 LAB — COMPREHENSIVE METABOLIC PANEL (CC13)
ALBUMIN: 3.3 g/dL — AB (ref 3.5–5.0)
ALK PHOS: 224 U/L — AB (ref 40–150)
ALT: 22 U/L (ref 0–55)
ANION GAP: 11 meq/L (ref 3–11)
AST: 32 U/L (ref 5–34)
BUN: 10.7 mg/dL (ref 7.0–26.0)
CO2: 23 meq/L (ref 22–29)
Calcium: 9.6 mg/dL (ref 8.4–10.4)
Chloride: 108 mEq/L (ref 98–109)
Creatinine: 1 mg/dL (ref 0.7–1.3)
EGFR: 77 mL/min/{1.73_m2} — ABNORMAL LOW (ref >90)
Glucose: 140 mg/dl (ref 70–140)
Potassium: 3.9 mEq/L (ref 3.5–5.1)
SODIUM: 142 meq/L (ref 136–145)
Total Bilirubin: 0.54 mg/dL (ref 0.20–1.20)
Total Protein: 6.5 g/dL (ref 6.4–8.3)

## 2015-03-18 MED ORDER — PEGFILGRASTIM 6 MG/0.6ML ~~LOC~~ PSKT
6.0000 mg | PREFILLED_SYRINGE | Freq: Once | SUBCUTANEOUS | Status: DC
  Filled 2015-03-18: qty 0.6

## 2015-03-18 MED ORDER — SODIUM CHLORIDE 0.9 % IV SOLN
500.0000 mg/m2 | Freq: Once | INTRAVENOUS | Status: AC
  Administered 2015-03-18: 1140 mg via INTRAVENOUS
  Filled 2015-03-18: qty 29.98

## 2015-03-18 MED ORDER — HEPARIN SOD (PORK) LOCK FLUSH 100 UNIT/ML IV SOLN
500.0000 [IU] | Freq: Once | INTRAVENOUS | Status: AC | PRN
  Administered 2015-03-18: 500 [IU]
  Filled 2015-03-18: qty 5

## 2015-03-18 MED ORDER — SODIUM CHLORIDE 0.9 % IV SOLN
Freq: Once | INTRAVENOUS | Status: AC
  Administered 2015-03-18: 10:00:00 via INTRAVENOUS

## 2015-03-18 MED ORDER — PACLITAXEL PROTEIN-BOUND CHEMO INJECTION 100 MG
80.0000 mg/m2 | Freq: Once | INTRAVENOUS | Status: AC
  Administered 2015-03-18: 175 mg via INTRAVENOUS
  Filled 2015-03-18: qty 35

## 2015-03-18 MED ORDER — DEXAMETHASONE SODIUM PHOSPHATE 100 MG/10ML IJ SOLN
Freq: Once | INTRAMUSCULAR | Status: AC
  Administered 2015-03-18: 10:00:00 via INTRAVENOUS
  Filled 2015-03-18: qty 4

## 2015-03-18 MED ORDER — SODIUM CHLORIDE 0.9 % IJ SOLN
10.0000 mL | INTRAMUSCULAR | Status: DC | PRN
  Filled 2015-03-18: qty 10

## 2015-03-18 MED ORDER — PEGFILGRASTIM 6 MG/0.6ML ~~LOC~~ PSKT: 6.0000 mg | PREFILLED_SYRINGE | Freq: Once | SUBCUTANEOUS | Status: DC

## 2015-03-18 MED ORDER — SODIUM CHLORIDE 0.9 % IJ SOLN
10.0000 mL | INTRAMUSCULAR | Status: DC | PRN
  Administered 2015-03-18 (×2): 10 mL via INTRAVENOUS
  Filled 2015-03-18: qty 10

## 2015-03-18 NOTE — Patient Instructions (Signed)

## 2015-03-18 NOTE — Patient Instructions (Signed)
[facility] Discharge Instructions for Patients Receiving Chemotherapy  Today you received the following chemotherapy agents: Abraxane and gemzar.  To help prevent nausea and vomiting after your treatment, we encourage you to take your nausea medication: Phenergan 25 mg every 6 hours as needed.   If you develop nausea and vomiting that is not controlled by your nausea medication, call the clinic.   BELOW ARE SYMPTOMS THAT SHOULD BE REPORTED IMMEDIATELY:  *FEVER GREATER THAN 100.5 F  *CHILLS WITH OR WITHOUT FEVER  NAUSEA AND VOMITING THAT IS NOT CONTROLLED WITH YOUR NAUSEA MEDICATION  *UNUSUAL SHORTNESS OF BREATH  *UNUSUAL BRUISING OR BLEEDING  TENDERNESS IN MOUTH AND THROAT WITH OR WITHOUT PRESENCE OF ULCERS  *URINARY PROBLEMS  *BOWEL PROBLEMS  UNUSUAL RASH Items with * indicate a potential emergency and should be followed up as soon as possible.  Feel free to call the clinic you have any questions or concerns. The clinic phone number is [phone].  Please show the [place] at check-in to the Emergency Department and triage nurse.

## 2015-03-19 ENCOUNTER — Ambulatory Visit (HOSPITAL_BASED_OUTPATIENT_CLINIC_OR_DEPARTMENT_OTHER): Payer: Medicare Other

## 2015-03-19 ENCOUNTER — Ambulatory Visit: Payer: Medicare Other

## 2015-03-19 VITALS — BP 144/64 | HR 89 | Temp 98.5°F | Resp 18

## 2015-03-19 DIAGNOSIS — Z5189 Encounter for other specified aftercare: Secondary | ICD-10-CM | POA: Diagnosis not present

## 2015-03-19 DIAGNOSIS — C250 Malignant neoplasm of head of pancreas: Secondary | ICD-10-CM

## 2015-03-19 LAB — CANCER ANTIGEN 19-9: CA 19 9: 987.2 U/mL — AB (ref <35.0)

## 2015-03-19 MED ORDER — PEGFILGRASTIM INJECTION 6 MG/0.6ML ~~LOC~~
6.0000 mg | PREFILLED_SYRINGE | Freq: Once | SUBCUTANEOUS | Status: AC
  Administered 2015-03-19: 6 mg via SUBCUTANEOUS
  Filled 2015-03-19: qty 0.6

## 2015-03-19 NOTE — Patient Instructions (Signed)
Pegfilgrastim injection What is this medicine? PEGFILGRASTIM (peg fil GRA stim) is a long-acting granulocyte colony-stimulating factor that stimulates the growth of neutrophils, a type of white blood cell important in the body's fight against infection. It is used to reduce the incidence of fever and infection in patients with certain types of cancer who are receiving chemotherapy that affects the bone marrow. This medicine may be used for other purposes; ask your health care provider or pharmacist if you have questions. COMMON BRAND NAME(S): Neulasta What should I tell my health care provider before I take this medicine? They need to know if you have any of these conditions: -latex allergy -ongoing radiation therapy -sickle cell disease -skin reactions to acrylic adhesives (On-Body Injector only) -an unusual or allergic reaction to pegfilgrastim, filgrastim, other medicines, foods, dyes, or preservatives -pregnant or trying to get pregnant -breast-feeding How should I use this medicine? This medicine is for injection under the skin. If you get this medicine at home, you will be taught how to prepare and give the pre-filled syringe or how to use the On-body Injector. Refer to the patient Instructions for Use for detailed instructions. Use exactly as directed. Take your medicine at regular intervals. Do not take your medicine more often than directed. It is important that you put your used needles and syringes in a special sharps container. Do not put them in a trash can. If you do not have a sharps container, call your pharmacist or healthcare provider to get one. Talk to your pediatrician regarding the use of this medicine in children. Special care may be needed. Overdosage: If you think you have taken too much of this medicine contact a poison control center or emergency room at once. NOTE: This medicine is only for you. Do not share this medicine with others. What if I miss a dose? It is  important not to miss your dose. Call your doctor or health care professional if you miss your dose. If you miss a dose due to an On-body Injector failure or leakage, a new dose should be administered as soon as possible using a single prefilled syringe for manual use. What may interact with this medicine? Interactions have not been studied. Give your health care provider a list of all the medicines, herbs, non-prescription drugs, or dietary supplements you use. Also tell them if you smoke, drink alcohol, or use illegal drugs. Some items may interact with your medicine. This list may not describe all possible interactions. Give your health care provider a list of all the medicines, herbs, non-prescription drugs, or dietary supplements you use. Also tell them if you smoke, drink alcohol, or use illegal drugs. Some items may interact with your medicine. What should I watch for while using this medicine? You may need blood work done while you are taking this medicine. If you are going to need a MRI, CT scan, or other procedure, tell your doctor that you are using this medicine (On-Body Injector only). What side effects may I notice from receiving this medicine? Side effects that you should report to your doctor or health care professional as soon as possible: -allergic reactions like skin rash, itching or hives, swelling of the face, lips, or tongue -dizziness -fever -pain, redness, or irritation at site where injected -pinpoint red spots on the skin -shortness of breath or breathing problems -stomach or side pain, or pain at the shoulder -swelling -tiredness -trouble passing urine Side effects that usually do not require medical attention (report to your doctor   or health care professional if they continue or are bothersome): -bone pain -muscle pain This list may not describe all possible side effects. Call your doctor for medical advice about side effects. You may report side effects to FDA at  1-800-FDA-1088. Where should I keep my medicine? Keep out of the reach of children. Store pre-filled syringes in a refrigerator between 2 and 8 degrees C (36 and 46 degrees F). Do not freeze. Keep in carton to protect from light. Throw away this medicine if it is left out of the refrigerator for more than 48 hours. Throw away any unused medicine after the expiration date. NOTE: This sheet is a summary. It may not cover all possible information. If you have questions about this medicine, talk to your doctor, pharmacist, or health care provider.  2015, [employer]. (2014-02-01 16:14:05)  

## 2015-03-26 ENCOUNTER — Telehealth: Payer: Self-pay | Admitting: *Deleted

## 2015-03-26 NOTE — Telephone Encounter (Signed)
Received fax from [facility] will be shipped 03/29/15

## 2015-04-01 ENCOUNTER — Other Ambulatory Visit (HOSPITAL_BASED_OUTPATIENT_CLINIC_OR_DEPARTMENT_OTHER): Payer: Medicare Other

## 2015-04-01 ENCOUNTER — Ambulatory Visit (HOSPITAL_BASED_OUTPATIENT_CLINIC_OR_DEPARTMENT_OTHER): Payer: Medicare Other

## 2015-04-01 ENCOUNTER — Encounter: Payer: Self-pay | Admitting: Hematology and Oncology

## 2015-04-01 ENCOUNTER — Ambulatory Visit (HOSPITAL_BASED_OUTPATIENT_CLINIC_OR_DEPARTMENT_OTHER): Payer: Medicare Other | Admitting: Hematology and Oncology

## 2015-04-01 ENCOUNTER — Telehealth: Payer: Self-pay | Admitting: Hematology and Oncology

## 2015-04-01 ENCOUNTER — Ambulatory Visit: Payer: Medicare Other

## 2015-04-01 VITALS — BP 149/65 | HR 91 | Temp 98.0°F | Resp 18 | Ht 74.0 in | Wt 233.0 lb

## 2015-04-01 DIAGNOSIS — Z95828 Presence of other vascular implants and grafts: Secondary | ICD-10-CM

## 2015-04-01 DIAGNOSIS — G620 Drug-induced polyneuropathy: Secondary | ICD-10-CM

## 2015-04-01 DIAGNOSIS — C250 Malignant neoplasm of head of pancreas: Secondary | ICD-10-CM

## 2015-04-01 DIAGNOSIS — Z5111 Encounter for antineoplastic chemotherapy: Secondary | ICD-10-CM | POA: Diagnosis present

## 2015-04-01 DIAGNOSIS — R748 Abnormal levels of other serum enzymes: Secondary | ICD-10-CM | POA: Diagnosis not present

## 2015-04-01 DIAGNOSIS — G8929 Other chronic pain: Secondary | ICD-10-CM | POA: Insufficient documentation

## 2015-04-01 DIAGNOSIS — D630 Anemia in neoplastic disease: Secondary | ICD-10-CM | POA: Diagnosis not present

## 2015-04-01 DIAGNOSIS — M25511 Pain in right shoulder: Secondary | ICD-10-CM

## 2015-04-01 DIAGNOSIS — T451X5A Adverse effect of antineoplastic and immunosuppressive drugs, initial encounter: Secondary | ICD-10-CM

## 2015-04-01 LAB — COMPREHENSIVE METABOLIC PANEL (CC13)
ALT: 15 U/L (ref 0–55)
AST: 24 U/L (ref 5–34)
Albumin: 3 g/dL — ABNORMAL LOW (ref 3.5–5.0)
Alkaline Phosphatase: 260 U/L — ABNORMAL HIGH (ref 40–150)
Anion Gap: 11 mEq/L (ref 3–11)
BUN: 10.7 mg/dL (ref 7.0–26.0)
CO2: 23 meq/L (ref 22–29)
CREATININE: 1 mg/dL (ref 0.7–1.3)
Calcium: 9.3 mg/dL (ref 8.4–10.4)
Chloride: 109 mEq/L (ref 98–109)
EGFR: 79 mL/min/{1.73_m2} — ABNORMAL LOW (ref >90)
Glucose: 151 mg/dl — ABNORMAL HIGH (ref 70–140)
Potassium: 3.8 mEq/L (ref 3.5–5.1)
Sodium: 143 mEq/L (ref 136–145)
Total Bilirubin: 0.49 mg/dL (ref 0.20–1.20)
Total Protein: 6.1 g/dL — ABNORMAL LOW (ref 6.4–8.3)

## 2015-04-01 LAB — CBC WITH DIFFERENTIAL/PLATELET
BASO%: 0.2 % (ref 0.0–2.0)
BASOS ABS: 0 10*3/uL (ref 0.0–0.1)
EOS%: 1.2 % (ref 0.0–7.0)
Eosinophils Absolute: 0.2 10*3/uL (ref 0.0–0.5)
HCT: 31.4 % — ABNORMAL LOW (ref 38.4–49.9)
HGB: 10.3 g/dL — ABNORMAL LOW (ref 13.0–17.1)
LYMPH#: 0.9 10*3/uL (ref 0.9–3.3)
LYMPH%: 5.4 % — AB (ref 14.0–49.0)
MCH: 33.2 pg (ref 27.2–33.4)
MCHC: 32.8 g/dL (ref 32.0–36.0)
MCV: 101.3 fL — ABNORMAL HIGH (ref 79.3–98.0)
MONO#: 1 10*3/uL — ABNORMAL HIGH (ref 0.1–0.9)
MONO%: 6.4 % (ref 0.0–14.0)
NEUT%: 86.8 % — AB (ref 39.0–75.0)
NEUTROS ABS: 14.1 10*3/uL — AB (ref 1.5–6.5)
PLATELETS: 219 10*3/uL (ref 140–400)
RBC: 3.1 10*6/uL — ABNORMAL LOW (ref 4.20–5.82)
RDW: 17.5 % — ABNORMAL HIGH (ref 11.0–14.6)
WBC: 16.2 10*3/uL — AB (ref 4.0–10.3)

## 2015-04-01 MED ORDER — SODIUM CHLORIDE 0.9 % IV SOLN
Freq: Once | INTRAVENOUS | Status: AC
  Administered 2015-04-01: 10:00:00 via INTRAVENOUS

## 2015-04-01 MED ORDER — SODIUM CHLORIDE 0.9 % IV SOLN
500.0000 mg/m2 | Freq: Once | INTRAVENOUS | Status: AC
  Administered 2015-04-01: 1140 mg via INTRAVENOUS
  Filled 2015-04-01: qty 29.98

## 2015-04-01 MED ORDER — SODIUM CHLORIDE 0.9 % IJ SOLN
10.0000 mL | INTRAMUSCULAR | Status: DC | PRN
  Administered 2015-04-01: 10 mL via INTRAVENOUS
  Filled 2015-04-01: qty 10

## 2015-04-01 MED ORDER — TRAMADOL HCL 50 MG PO TABS: 50.0000 mg | ORAL_TABLET | Freq: Four times a day (QID) | ORAL | Status: DC | PRN

## 2015-04-01 MED ORDER — HEPARIN SOD (PORK) LOCK FLUSH 100 UNIT/ML IV SOLN
500.0000 [IU] | Freq: Once | INTRAVENOUS | Status: AC | PRN
  Administered 2015-04-01: 500 [IU]
  Filled 2015-04-01: qty 5

## 2015-04-01 MED ORDER — SODIUM CHLORIDE 0.9 % IJ SOLN
10.0000 mL | INTRAMUSCULAR | Status: DC | PRN
  Administered 2015-04-01: 10 mL
  Filled 2015-04-01: qty 10

## 2015-04-01 MED ORDER — SODIUM CHLORIDE 0.9 % IV SOLN
Freq: Once | INTRAVENOUS | Status: AC
  Administered 2015-04-01: 10:00:00 via INTRAVENOUS
  Filled 2015-04-01: qty 4

## 2015-04-01 MED ORDER — PACLITAXEL PROTEIN-BOUND CHEMO INJECTION 100 MG
80.0000 mg/m2 | Freq: Once | INTRAVENOUS | Status: AC
  Administered 2015-04-01: 175 mg via INTRAVENOUS
  Filled 2015-04-01: qty 35

## 2015-04-01 NOTE — Progress Notes (Signed)
[name] OFFICE PROGRESS NOTE  Patient Care Team: Lavone Orn, MD as PCP - General (Internal Medicine) [name], MD as Referring Physician (Specialist) Provider Not In System [name], MD as Consulting Physician (Hematology and Oncology)  SUMMARY OF ONCOLOGIC HISTORY: Oncology History   Pancreatic cancer   Primary site: Pancreas   Staging method: AJCC 7th Edition   Clinical: Stage IIA (T3, N0, M0) signed by [name], MD on 06/13/2014  4:37 PM   Summary: Stage IIA (T3, N0, M0)       Malignant neoplasm of head of pancreas   06/05/2014 Imaging  MRI abdomen showed 2.2 cm lesion in the pancreatic head obstructing the common bile duct and the pancreatic duct is highly concerning for pancreatic adenocarcinoma.  There is moderate intra and extrahepatic biliary duct dilatation.    06/05/2014 Tumor Marker CA 19-9 is elevated at 1234   06/06/2014 Imaging Ct scan of chest showed 2 lung nodules, likely benign   06/07/2014 Procedure He had ERCP and stent placement   06/07/2014 Procedure He had endoscopic ultrasound with  fine needle aspiration.   06/07/2014 Pathology Results Accession: [id number] FNA is positive for pancreatic adenoca   06/14/2014 Surgery He has port placement   06/25/2014 - 08/01/2014 Chemotherapy He received neoadjuvant chemotherapy with Gemzar   06/25/2014 - 08/06/2014 Radiation Therapy he received neoadjuvant radiation: 50.4 Gy   08/13/2014 - 08/23/2014 Hospital Admission The patient was admitted to the hospital related to uncontrolled nausea, vomiting and severe epigastric pain and was found to have significant gastric ulcer, resolved with conservative management and total parenteral nutrition   09/06/2014 Imaging Repeat CT scan show enlarging lung nodule, suspicious for early metastatic disease.   10/18/2014 Imaging CT scan of the chest, abdomen and pelvis show metastatic disease in the lung and liver.   10/18/2014 Tumor Marker CA-19-9 at 1745   10/22/2014 - 12/03/2014  Chemotherapy The patient is placed on modified FOLFOX chemotherapy   11/05/2014 Tumor Marker Ca19-9 at 2447   12/14/2014 Imaging CT scan showed disease progression in the liver   12/14/2014 Tumor Marker CA 19-9 at 2050   12/17/2014 -  Chemotherapy the patient was starting on Abraxane with reduced dose Gemzar   12/24/2014 Tumor Marker CA-19-9 is at 1387.   12/26/2014 - 12/29/2014 Hospital Admission the patient was admitted to the hospital for hospital-acquired pneumonia.   01/07/2015 Adverse Reaction cycle 2 of treatment with reduced dose Abraxane due to recent hospitalization   01/12/2015 - 01/16/2015 Hospital Admission  the patient was admitted to the hospital again for hospital acquired pneumonia.   03/04/2015 Imaging Repeat CT scans show improvement of disease control.    INTERVAL HISTORY: Please see below for problem oriented charting. He returns for further follow-up. He is seen prior to cycle 6 of treatment. His symptoms are about the same. He complained of very mild peripheral neuropathy at the feet. Denies nausea, vomiting, fevers or chills. He complained localize right shoulder pain that bothers him. It comes and goes, sometimes aggravated by activity. Rated the pain at 4 out of 10.  REVIEW OF SYSTEMS:   Constitutional: Denies fevers, chills or abnormal weight loss Eyes: Denies blurriness of vision Ears, nose, mouth, throat, and face: Denies mucositis or sore throat Respiratory: Denies cough, dyspnea or wheezes Cardiovascular: Denies palpitation, chest discomfort or lower extremity swelling Gastrointestinal:  Denies nausea, heartburn or change in bowel habits Skin: Denies abnormal skin rashes Lymphatics: Denies new lymphadenopathy or easy bruising Neurological:Denies numbness, tingling or new weaknesses  Behavioral/Psych: Mood is stable, no new changes  All other systems were reviewed with the patient and are negative.  I have reviewed the past medical history, past surgical history,  social history and family history with the patient and they are unchanged from previous note.  ALLERGIES:  is allergic to ace inhibitors and ciprofloxacin.  MEDICATIONS:  Current Outpatient Prescriptions  Medication Sig Dispense Refill  . acetaminophen (TYLENOL) 500 MG tablet Take 1,000 mg by mouth every 6 (six) hours as needed for fever.    Marland Kitchen aspirin EC 81 MG tablet Take 81 mg by mouth daily.    . B Complex-C (SUPER B COMPLEX/VITAMIN C PO) Take 1 tablet by mouth daily.     . cholecalciferol (VITAMIN D) 1000 UNITS tablet Take 1,000 Units by mouth daily.    . dasatinib (SPRYCEL) 50 MG tablet Take 1 tablet (50 mg total) by mouth daily. 30 tablet 2  . insulin glargine (LANTUS) 100 UNIT/ML injection Inject 26 Units into the skin at bedtime.     Marland Kitchen lactulose (CHRONULAC) 10 GM/15ML solution Take 10 g by mouth 2 (two) times daily as needed for moderate constipation.     Marland Kitchen losartan (COZAAR) 50 MG tablet Take 50 mg by mouth daily.  11  . magnesium oxide (MAG-OX) 400 MG tablet Take 1 tablet (400 mg total) by mouth daily. 30 tablet 3  . metFORMIN (GLUCOPHAGE) 1000 MG tablet Take 1,000 mg by mouth 2 (two) times daily with a meal.    . pantoprazole (PROTONIX) 40 MG tablet Take 1 tablet (40 mg total) by mouth daily. 60 tablet 6  . PRESCRIPTION MEDICATION Chemo at [facility] 01/07/15.    Marland Kitchen promethazine (PHENERGAN) 25 MG tablet Take 25 mg by mouth every 6 (six) hours as needed for nausea or vomiting (nausea).    . sennosides-docusate sodium (SENOKOT-S) 8.6-50 MG tablet Take 2 tablets by mouth at bedtime.     . simvastatin (ZOCOR) 20 MG tablet Take 20 mg by mouth daily.     . vitamin B-12 (CYANOCOBALAMIN) 1000 MCG tablet Take 1,000 mcg by mouth daily.    . traMADol (ULTRAM) 50 MG tablet Take 1 tablet (50 mg total) by mouth every 6 (six) hours as needed. 30 tablet 0   No current facility-administered medications for this visit.   Facility-Administered Medications Ordered in Other Visits  Medication Dose Route  Frequency Provider Last Rate Last Dose  . Gemcitabine HCl (GEMZAR) 1,140 mg in sodium chloride 0.9 % 100 mL chemo infusion  500 mg/m2 (Treatment Plan Actual) Intravenous Once [name], MD      . heparin lock flush 100 unit/mL  500 Units Intracatheter Once PRN [name], MD      . PACLitaxel-protein bound (ABRAXANE) chemo infusion 175 mg  80 mg/m2 (Treatment Plan Actual) Intravenous Once [name], MD      . sodium chloride 0.9 % injection 10 mL  10 mL Intravenous PRN [name], MD   10 mL at 04/01/15 0856  . sodium chloride 0.9 % injection 10 mL  10 mL Intracatheter PRN [name], MD        PHYSICAL EXAMINATION: ECOG PERFORMANCE STATUS: 1 - Symptomatic but completely ambulatory  Filed Vitals:   04/01/15 0907  BP: 149/65  Pulse: 91  Temp: 98 F (36.7 C)  Resp: 18   Filed Weights   04/01/15 0907  Weight: 233 lb (105.688 kg)    GENERAL:alert, no distress and comfortable SKIN: skin color, texture, turgor are normal, no rashes or  significant lesions EYES: normal, Conjunctiva are pink and non-injected, sclera clear OROPHARYNX:no exudate, no erythema and lips, buccal mucosa, and tongue normal  NECK: supple, thyroid normal size, non-tender, without nodularity LYMPH:  no palpable lymphadenopathy in the cervical, axillary or inguinal LUNGS: clear to auscultation and percussion with normal breathing effort HEART: regular rate & rhythm and no murmurs and no lower extremity edema ABDOMEN:abdomen soft, non-tender and normal bowel sounds Musculoskeletal:no cyanosis of digits and no clubbing  NEURO: alert & oriented x 3 with fluent speech, no focal motor/sensory deficits  LABORATORY DATA:  I have reviewed the data as listed    Component Value Date/Time   NA 143 04/01/2015 0826   NA 141 03/04/2015 1509   K 3.8 04/01/2015 0826   K 3.5 03/04/2015 1509   CL 105 03/04/2015 1509   CL 107 04/27/2013 0926   CO2 23 04/01/2015 0826   CO2 23 03/04/2015 1509   GLUCOSE 151* 04/01/2015 0826    GLUCOSE 114* 03/04/2015 1509   GLUCOSE 212* 04/27/2013 0926   BUN 10.7 04/01/2015 0826   BUN 11 03/04/2015 1509   CREATININE 1.0 04/01/2015 0826   CREATININE 1.06 03/04/2015 1509   CALCIUM 9.3 04/01/2015 0826   CALCIUM 9.3 03/04/2015 1509   PROT 6.1* 04/01/2015 0826   PROT 5.9* 03/04/2015 1509   ALBUMIN 3.0* 04/01/2015 0826   ALBUMIN 2.9* 03/04/2015 1509   AST 24 04/01/2015 0826   AST 32 03/04/2015 1509   ALT 15 04/01/2015 0826   ALT 18 03/04/2015 1509   ALKPHOS 260* 04/01/2015 0826   ALKPHOS 243* 03/04/2015 1509   BILITOT 0.49 04/01/2015 0826   BILITOT 0.6 03/04/2015 1509   GFRNONAA 68* 03/04/2015 1509   GFRAA 79* 03/04/2015 1509    No results found for: SPEP, UPEP  Lab Results  Component Value Date   WBC 16.2* 04/01/2015   NEUTROABS 14.1* 04/01/2015   HGB 10.3* 04/01/2015   HCT 31.4* 04/01/2015   MCV 101.3* 04/01/2015   PLT 219 04/01/2015      Chemistry      Component Value Date/Time   NA 143 04/01/2015 0826   NA 141 03/04/2015 1509   K 3.8 04/01/2015 0826   K 3.5 03/04/2015 1509   CL 105 03/04/2015 1509   CL 107 04/27/2013 0926   CO2 23 04/01/2015 0826   CO2 23 03/04/2015 1509   BUN 10.7 04/01/2015 0826   BUN 11 03/04/2015 1509   CREATININE 1.0 04/01/2015 0826   CREATININE 1.06 03/04/2015 1509      Component Value Date/Time   CALCIUM 9.3 04/01/2015 0826   CALCIUM 9.3 03/04/2015 1509   ALKPHOS 260* 04/01/2015 0826   ALKPHOS 243* 03/04/2015 1509   AST 24 04/01/2015 0826   AST 32 03/04/2015 1509   ALT 15 04/01/2015 0826   ALT 18 03/04/2015 1509   BILITOT 0.49 04/01/2015 0826   BILITOT 0.6 03/04/2015 1509       RADIOGRAPHIC STUDIES: I reviewed the prior two CT scan I have personally reviewed the radiological images as listed and agreed with the findings in the report.   ASSESSMENT & PLAN:  Malignant neoplasm of head of pancreas After I reduced the dose of chemotherapy with Abraxane 20% along with reduced dose Gemzar by 50% plus G-CSF support  with each cycle,  He tolerated that better. Recent repeat CT scan show reduction in the size of cancer I will recommend a few more cycles of treatment and repeat imaging in 3 months, due in July 2016.  I noted that the tumor markers fluctuated up and down. I discussed with the patient and his wife the limitation of tumor marker monitoring.     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 The patient denies recent history of bleeding such as epistaxis, hematuria or hematochezia. [name] I will observe for now.  He does not require transfusion now.      Elevated liver enzymes This is likely related to liver metastasis and fatty liver disease. We will continue with dose adjustment as above.    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 He will continue dose adjusted treatment with abraxane    Chronic right shoulder pain I reviewed the last 2 CT scan of the chest with particular attention to the right shoulder area. Physical examination confirmed localized pain which is likely related to degenerative arthritis. 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 I recommend a trial of low-dose tramadol. If the pain does not improve, I will refer him to sports medicine/orthopedic team for possible local injection    Orders Placed This Encounter  Procedures  . CBC with Differential/Platelet    Standing Status: Standing     Number of Occurrences: 22     Standing Expiration Date: 03/31/2016   All questions were answered. The patient knows to call the clinic with any problems, questions or concerns. No barriers to learning was detected. I spent 30 minutes counseling the patient face to face. The total time spent in the appointment was 40 minutes and more than 50% was on counseling and review of test results     [facility], Rita Vialpando,  MD 04/01/2015 10:15 AM

## 2015-04-01 NOTE — Assessment & Plan Note (Signed)
This is likely due to recent treatment. The patient denies recent history of bleeding such as epistaxis, hematuria or hematochezia. [name] I will observe for now.  He does not require transfusion now. 

## 2015-04-01 NOTE — Assessment & Plan Note (Signed)
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 He will continue dose adjusted treatment with abraxane

## 2015-04-01 NOTE — Patient Instructions (Signed)
[facility] Discharge Instructions for Patients Receiving Chemotherapy  Today you received the following chemotherapy agents Abraxane/Gemzar.  To help prevent nausea and vomiting after your treatment, we encourage you to take your nausea medication as directed.   If you develop nausea and vomiting that is not controlled by your nausea medication, call the clinic.   BELOW ARE SYMPTOMS THAT SHOULD BE REPORTED IMMEDIATELY:  *FEVER GREATER THAN 100.5 F  *CHILLS WITH OR WITHOUT FEVER  NAUSEA AND VOMITING THAT IS NOT CONTROLLED WITH YOUR NAUSEA MEDICATION  *UNUSUAL SHORTNESS OF BREATH  *UNUSUAL BRUISING OR BLEEDING  TENDERNESS IN MOUTH AND THROAT WITH OR WITHOUT PRESENCE OF ULCERS  *URINARY PROBLEMS  *BOWEL PROBLEMS  UNUSUAL RASH Items with * indicate a potential emergency and should be followed up as soon as possible.  Feel free to call the clinic you have any questions or concerns. The clinic phone number is [phone].  Please show the [facility] at check-in to the Emergency Department and triage nurse.

## 2015-04-01 NOTE — Assessment & Plan Note (Signed)
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 We will continue with dose adjustment as above.

## 2015-04-01 NOTE — Telephone Encounter (Signed)
Gave and printed appt sched anda vs for pt for May thru July....added tx.

## 2015-04-01 NOTE — Assessment & Plan Note (Signed)
I reviewed the last 2 CT scan of the chest with particular attention to the right shoulder area. Physical examination confirmed localized pain which is likely related to degenerative arthritis. 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 I recommend a trial of low-dose tramadol. If the pain does not improve, I will refer him to sports medicine/orthopedic team for possible local injection

## 2015-04-01 NOTE — Patient Instructions (Signed)

## 2015-04-01 NOTE — Assessment & Plan Note (Signed)
After I reduced the dose of chemotherapy with Abraxane 20% along with reduced dose Gemzar by 50% plus G-CSF support with each cycle,  He tolerated that better. Recent repeat CT scan show reduction in the size of cancer I will recommend a few more cycles of treatment and repeat imaging in 3 months, due in July 2016. I noted that the tumor markers fluctuated up and down. I discussed with the patient and his wife the limitation of tumor marker monitoring.

## 2015-04-03 ENCOUNTER — Ambulatory Visit (INDEPENDENT_AMBULATORY_CARE_PROVIDER_SITE_OTHER): Payer: Medicare Other | Admitting: Podiatry

## 2015-04-03 DIAGNOSIS — B351 Tinea unguium: Secondary | ICD-10-CM | POA: Diagnosis not present

## 2015-04-03 DIAGNOSIS — E0840 Diabetes mellitus due to underlying condition with diabetic neuropathy, unspecified: Secondary | ICD-10-CM | POA: Diagnosis not present

## 2015-04-03 NOTE — Progress Notes (Signed)
Patient ID: Evan [name], male   DOB: 04/05/1941, 74 y.o.   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

## 2015-04-03 NOTE — Patient Instructions (Signed)
Diabetes and Foot Care Diabetes may cause you to have problems because of poor blood supply (circulation) to your feet and legs. This may cause the skin on your feet to become thinner, break easier, and heal more slowly. Your skin may become dry, and the skin may peel and crack. You may also have nerve damage in your legs and feet causing decreased feeling in them. You may not notice minor injuries to your feet that could lead to infections or more serious problems. Taking care of your feet is one of the most important things you can do for yourself.  HOME CARE INSTRUCTIONS  Wear shoes at all times, even in the house. Do not go barefoot. Bare feet are easily injured.  Check your feet daily for blisters, cuts, and redness. If you cannot see the bottom of your feet, use a mirror or ask someone for help.  Wash your feet with warm water (do not use hot water) and mild soap. Then pat your feet and the areas between your toes until they are completely dry. Do not soak your feet as this can dry your skin.  Apply a moisturizing lotion or petroleum jelly (that does not contain alcohol and is unscented) to the skin on your feet and to dry, brittle toenails. Do not apply lotion between your toes.  Trim your toenails straight across. Do not dig under them or around the cuticle. File the edges of your nails with an emery board or nail file.  Do not cut corns or calluses or try to remove them with medicine.  Wear clean socks or stockings every day. Make sure they are not too tight. Do not wear knee-high stockings since they may decrease blood flow to your legs.  Wear shoes that fit properly and have enough cushioning. To break in new shoes, wear them for just a few hours a day. This prevents you from injuring your feet. Always look in your shoes before you put them on to be sure there are no objects inside.  Do not cross your legs. This may decrease the blood flow to your feet.  If you find a minor scrape,  cut, or break in the skin on your feet, keep it and the skin around it clean and dry. These areas may be cleansed with mild soap and water. Do not cleanse the area with peroxide, alcohol, or iodine.  When you remove an adhesive bandage, be sure not to damage the skin around it.  If you have a wound, look at it several times a day to make sure it is healing.  Do not use heating pads or hot water bottles. They may burn your skin. If you have lost feeling in your feet or legs, you may not know it is happening until it is too late.  Make sure your health care provider performs a complete foot exam at least annually or more often if you have foot problems. Report any cuts, sores, or bruises to your health care provider immediately. SEEK MEDICAL CARE IF:   You have an injury that is not healing.  You have cuts or breaks in the skin.  You have an ingrown nail.  You notice redness on your legs or feet.  You feel burning or tingling in your legs or feet.  You have pain or cramps in your legs and feet.  Your legs or feet are numb.  Your feet always feel cold. SEEK IMMEDIATE MEDICAL CARE IF:   There is increasing redness,   swelling, or pain in or around a wound.  There is a red line that goes up your leg.  Pus is coming from a wound.  You develop a fever or as directed by your health care provider.  You notice a bad smell coming from an ulcer or wound. Document Released: 10/30/2000 Document Revised: 07/05/2013 Document Reviewed: 04/11/2013 Exit[employer] Patient Information 2015 [employer]. This information is not intended to replace advice given to you by your health care provider. Make sure you discuss any questions you have with your health care provider.  

## 2015-04-04 ENCOUNTER — Encounter (HOSPITAL_COMMUNITY): Payer: Self-pay | Admitting: Emergency Medicine

## 2015-04-04 ENCOUNTER — Emergency Department (HOSPITAL_COMMUNITY): Payer: Medicare Other

## 2015-04-04 ENCOUNTER — Emergency Department (HOSPITAL_COMMUNITY)
Admission: EM | Admit: 2015-04-04 | Discharge: 2015-04-04 | Disposition: A | Discharge status: Home / Self Care | Payer: Medicare Other | Attending: Emergency Medicine | Admitting: Emergency Medicine

## 2015-04-04 ENCOUNTER — Other Ambulatory Visit: Payer: Self-pay

## 2015-04-04 ENCOUNTER — Telehealth: Payer: Self-pay | Admitting: *Deleted

## 2015-04-04 DIAGNOSIS — K5900 Constipation, unspecified: Secondary | ICD-10-CM | POA: Insufficient documentation

## 2015-04-04 DIAGNOSIS — I483 Typical atrial flutter: Secondary | ICD-10-CM

## 2015-04-04 DIAGNOSIS — R531 Weakness: Secondary | ICD-10-CM | POA: Insufficient documentation

## 2015-04-04 DIAGNOSIS — I10 Essential (primary) hypertension: Secondary | ICD-10-CM | POA: Insufficient documentation

## 2015-04-04 DIAGNOSIS — Z856 Personal history of leukemia: Secondary | ICD-10-CM | POA: Diagnosis not present

## 2015-04-04 DIAGNOSIS — E119 Type 2 diabetes mellitus without complications: Secondary | ICD-10-CM | POA: Diagnosis not present

## 2015-04-04 DIAGNOSIS — Z862 Personal history of diseases of the blood and blood-forming organs and certain disorders involving the immune mechanism: Secondary | ICD-10-CM | POA: Diagnosis not present

## 2015-04-04 DIAGNOSIS — Z8507 Personal history of malignant neoplasm of pancreas: Secondary | ICD-10-CM | POA: Insufficient documentation

## 2015-04-04 DIAGNOSIS — Z87891 Personal history of nicotine dependence: Secondary | ICD-10-CM | POA: Insufficient documentation

## 2015-04-04 DIAGNOSIS — K219 Gastro-esophageal reflux disease without esophagitis: Secondary | ICD-10-CM | POA: Diagnosis not present

## 2015-04-04 DIAGNOSIS — Z8659 Personal history of other mental and behavioral disorders: Secondary | ICD-10-CM | POA: Diagnosis not present

## 2015-04-04 DIAGNOSIS — G8929 Other chronic pain: Secondary | ICD-10-CM | POA: Insufficient documentation

## 2015-04-04 DIAGNOSIS — I4891 Unspecified atrial fibrillation: Secondary | ICD-10-CM | POA: Insufficient documentation

## 2015-04-04 DIAGNOSIS — E780 Pure hypercholesterolemia: Secondary | ICD-10-CM | POA: Insufficient documentation

## 2015-04-04 DIAGNOSIS — Z8739 Personal history of other diseases of the musculoskeletal system and connective tissue: Secondary | ICD-10-CM | POA: Diagnosis not present

## 2015-04-04 DIAGNOSIS — Z7982 Long term (current) use of aspirin: Secondary | ICD-10-CM | POA: Insufficient documentation

## 2015-04-04 DIAGNOSIS — Z79899 Other long term (current) drug therapy: Secondary | ICD-10-CM | POA: Diagnosis not present

## 2015-04-04 LAB — COMPREHENSIVE METABOLIC PANEL
ALK PHOS: 268 U/L — AB (ref 38–126)
ALT: 17 U/L (ref 17–63)
AST: 27 U/L (ref 15–41)
Albumin: 3 g/dL — ABNORMAL LOW (ref 3.5–5.0)
Anion gap: 9 (ref 5–15)
BUN: 16 mg/dL (ref 6–20)
CO2: 24 mmol/L (ref 22–32)
Calcium: 9.4 mg/dL (ref 8.9–10.3)
Chloride: 104 mmol/L (ref 101–111)
Creatinine, Ser: 1.02 mg/dL (ref 0.61–1.24)
GLUCOSE: 233 mg/dL — AB (ref 65–99)
POTASSIUM: 4.2 mmol/L (ref 3.5–5.1)
SODIUM: 137 mmol/L (ref 135–145)
Total Bilirubin: 1 mg/dL (ref 0.3–1.2)
Total Protein: 6.1 g/dL — ABNORMAL LOW (ref 6.5–8.1)

## 2015-04-04 LAB — CBC WITH DIFFERENTIAL/PLATELET
BASOS ABS: 0 10*3/uL (ref 0.0–0.1)
Basophils Relative: 0 % (ref 0–1)
EOS ABS: 0 10*3/uL (ref 0.0–0.7)
EOS PCT: 0 % (ref 0–5)
HCT: 33.6 % — ABNORMAL LOW (ref 39.0–52.0)
Hemoglobin: 10.8 g/dL — ABNORMAL LOW (ref 13.0–17.0)
Lymphocytes Relative: 3 % — ABNORMAL LOW (ref 12–46)
Lymphs Abs: 0.4 10*3/uL — ABNORMAL LOW (ref 0.7–4.0)
MCH: 32.2 pg (ref 26.0–34.0)
MCHC: 32.1 g/dL (ref 30.0–36.0)
MCV: 100.3 fL — ABNORMAL HIGH (ref 78.0–100.0)
MONO ABS: 0.1 10*3/uL (ref 0.1–1.0)
Monocytes Relative: 0 % — ABNORMAL LOW (ref 3–12)
Neutro Abs: 14.1 10*3/uL — ABNORMAL HIGH (ref 1.7–7.7)
Neutrophils Relative %: 97 % — ABNORMAL HIGH (ref 43–77)
PLATELETS: 274 10*3/uL (ref 150–400)
RBC: 3.35 MIL/uL — ABNORMAL LOW (ref 4.22–5.81)
RDW: 16.9 % — AB (ref 11.5–15.5)
WBC: 14.6 10*3/uL — AB (ref 4.0–10.5)

## 2015-04-04 LAB — URINALYSIS, ROUTINE W REFLEX MICROSCOPIC
BILIRUBIN URINE: NEGATIVE
Glucose, UA: NEGATIVE mg/dL
Hgb urine dipstick: NEGATIVE
Ketones, ur: NEGATIVE mg/dL
Leukocytes, UA: NEGATIVE
Nitrite: NEGATIVE
PH: 5.5 (ref 5.0–8.0)
Protein, ur: 30 mg/dL — AB
Specific Gravity, Urine: 1.029 (ref 1.005–1.030)
Urobilinogen, UA: 1 mg/dL (ref 0.0–1.0)

## 2015-04-04 LAB — URINE MICROSCOPIC-ADD ON

## 2015-04-04 LAB — I-STAT TROPONIN, ED: Troponin i, poc: 0.01 ng/mL (ref 0.00–0.08)

## 2015-04-04 MED ORDER — DILTIAZEM HCL ER COATED BEADS 120 MG PO CP24: 120.0000 mg | ORAL_CAPSULE | Freq: Every day | ORAL | Status: DC

## 2015-04-04 MED ORDER — RIVAROXABAN 15 MG PO TABS: 15.0000 mg | ORAL_TABLET | Freq: Every day | ORAL | Status: DC

## 2015-04-04 MED ORDER — IOHEXOL 350 MG/ML SOLN
80.0000 mL | Freq: Once | INTRAVENOUS | Status: AC | PRN
  Administered 2015-04-04: 80 mL via INTRAVENOUS

## 2015-04-04 NOTE — ED Provider Notes (Signed)
CSN: [id number]     Arrival date & time 04/04/15  1334 History   First MD Initiated Contact with Patient 04/04/15 1346     Chief Complaint  Patient presents with  . Atrial Fibrillation     (Consider location/radiation/quality/duration/timing/severity/associated sxs/prior Treatment) Patient is a 74 y.o. male presenting with general illness.  Illness Location:  Generalized Quality:  Weakness Severity:  Moderate Onset quality:  Gradual Duration:  2 days Timing:  Constant Progression:  Unchanged Chronicity:  New Context:  Spontaneous, seen by home health with regular tachycardia Relieved by:  Nothing Worsened by:  Nothing Associated symptoms: no abdominal pain, no chest pain, no nausea, no shortness of breath and no vomiting     Past Medical History  Diagnosis Date  . Chronic back pain greater than 3 months duration   . Bulging discs   . Diabetes mellitus     type II; neuropathy;   . Hypertension   . High cholesterol   . Leukocytosis   . GERD (gastroesophageal reflux disease)   . Pancreatitis 2004  . Anxiety   . DDD (degenerative disc disease) 2005    lumbar spine  . History of smoking 08/01/2012  . Weight loss 08/01/2012  . Cough 08/01/2012  . Left leg pain 08/05/2012  . Chronic pancreatitis   . OSA (obstructive sleep apnea) 2010    does not use CPAP  . Nausea alone 06/13/2014  . Allergy   . Mucositis (ulcerative) due to antineoplastic therapy 07/02/2014  . Constipated 07/16/2014  . Gastric ulcer 08/23/2014  . Hx of radiation therapy 06/25/14-08/06/14    pancreas head, 50.4Gy  . 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 Pancreatic cancer 06/08/2014  . Chest pain 11/05/2014  . Tachycardia 03/04/2015   Past Surgical History  Procedure Laterality Date  . Cholecystectomy      laparoscopic  . Hernia repair      umbilical  . Skin cancer removed  2012    right ear, basal cell carcinoma.   Marland Kitchen Spinal stretching    . Colonoscopy  2012   [facility]  . Eus Left 06/07/2014    Procedure: UPPER ENDOSCOPIC ULTRASOUND (EUS) LINEAR;  Surgeon: Arta Silence, MD;  Location: [facility];  Service: Endoscopy;  Laterality: Left;  . Ercp N/A 06/07/2014    Procedure: ENDOSCOPIC RETROGRADE CHOLANGIOPANCREATOGRAPHY (ERCP);  Surgeon: Arta Silence, MD;  Location: [facility];  Service: Endoscopy;  Laterality: N/A;  . Eye surgery      bilateral cataracts with lens implant  . Cataract extraction Bilateral   . Port acath placement Left 06/14/14  . Portacath placement Left 06/14/2014    Procedure: INSERTION PORT-A-CATH;  Surgeon: Stark Klein, MD;  Location: [facility];  Service: General;  Laterality: Left;  . Esophagogastroduodenoscopy N/A 08/15/2014    Procedure: ESOPHAGOGASTRODUODENOSCOPY (EGD);  Surgeon: Lear Ng, MD;  Location: [facility];  Service: Endoscopy;  Laterality: N/A;   Family History  Problem Relation Age of Onset  . Heart attack Mother   . Cancer Mother 74    lung  . Heart attack Father   . Stroke Father    History  Substance Use Topics  . Smoking status: Former Smoker -- 1.50 packs/day for 40 years    Quit date: 01/24/2004  . Smokeless tobacco: Never Used  . Alcohol Use: No    Review of Systems  Respiratory: Negative for shortness of breath.   Cardiovascular: Negative for chest pain.  Gastrointestinal: Negative for nausea, vomiting and abdominal  pain.  All other systems reviewed and are negative.     Allergies  Ace inhibitors and Ciprofloxacin  Home Medications   Prior to Admission medications   Medication Sig Start Date End Date Taking? Authorizing Provider  acetaminophen (TYLENOL) 500 MG tablet Take 1,000 mg by mouth every 6 (six) hours as needed for fever.   Yes Historical Provider, MD  aspirin EC 81 MG tablet Take 81 mg by mouth daily.   Yes Historical Provider, MD  B Complex-C (SUPER B COMPLEX/VITAMIN C PO) Take 1 tablet by mouth daily.    Yes Historical Provider, MD  cholecalciferol (VITAMIN D)  1000 UNITS tablet Take 1,000 Units by mouth daily.   Yes Historical Provider, MD  dasatinib (SPRYCEL) 50 MG tablet Take 1 tablet (50 mg total) by mouth daily. 01/25/15  Yes [name], MD  insulin glargine (LANTUS) 100 UNIT/ML injection Inject 26 Units into the skin at bedtime.  08/09/14  Yes Modena Jansky, MD  lactulose (CHRONULAC) 10 GM/15ML solution Take 10 g by mouth 2 (two) times daily as needed for moderate constipation.  06/21/14  Yes Historical Provider, MD  losartan (COZAAR) 50 MG tablet Take 50 mg by mouth daily. 12/06/14  Yes Historical Provider, MD  magnesium oxide (MAG-OX) 400 MG tablet Take 1 tablet (400 mg total) by mouth daily. 03/06/15  Yes [name], MD  metFORMIN (GLUCOPHAGE) 1000 MG tablet Take 1,000 mg by mouth 2 (two) times daily with a meal.   Yes Historical Provider, MD  pantoprazole (PROTONIX) 40 MG tablet Take 1 tablet (40 mg total) by mouth daily. 08/23/14  Yes [name], MD  promethazine (PHENERGAN) 25 MG tablet Take 25 mg by mouth every 6 (six) hours as needed for nausea or vomiting (nausea).   Yes Historical Provider, MD  sennosides-docusate sodium (SENOKOT-S) 8.6-50 MG tablet Take 2 tablets by mouth at bedtime.    Yes Historical Provider, MD  simvastatin (ZOCOR) 20 MG tablet Take 20 mg by mouth daily.    Yes Historical Provider, MD  traMADol (ULTRAM) 50 MG tablet Take 1 tablet (50 mg total) by mouth every 6 (six) hours as needed. Patient taking differently: Take 50 mg by mouth every 6 (six) hours as needed for moderate pain.  04/01/15  Yes [name], MD  vitamin B-12 (CYANOCOBALAMIN) 1000 MCG tablet Take 1,000 mcg by mouth daily.   Yes Historical Provider, MD  diltiazem (CARDIZEM CD) 120 MG 24 hr capsule Take 1 capsule (120 mg total) by mouth daily. 04/04/15   Debby Freiberg, MD  Rivaroxaban (XARELTO) 15 MG TABS tablet Take 1 tablet (15 mg total) by mouth daily with supper. 04/04/15   Debby Freiberg, MD   BP 136/71 mmHg  Pulse 90  Temp(Src) 98.3 F (36.8 C) (Oral)   Resp 17  SpO2 97% Physical Exam  Constitutional: He is oriented to person, place, and time. He appears well-developed and well-nourished.  HENT:  Head: Normocephalic and atraumatic.  Eyes: Conjunctivae and EOM are normal.  Neck: Normal range of motion. Neck supple.  Cardiovascular: Normal rate, regular rhythm and normal heart sounds.   Pulmonary/Chest: Effort normal and breath sounds normal. No respiratory distress.  Abdominal: He exhibits no distension. There is no tenderness. There is no rebound and no guarding.  Musculoskeletal: Normal range of motion.  Neurological: He is alert and oriented to person, place, and time.  Skin: Skin is warm and dry.  Vitals reviewed.   ED Course  Procedures (including critical care time) Labs Review Labs Reviewed  COMPREHENSIVE METABOLIC PANEL - Abnormal; Notable for the following:    Glucose, Bld 233 (*)    Total Protein 6.1 (*)    Albumin 3.0 (*)    Alkaline Phosphatase 268 (*)    All other components within normal limits  CBC WITH DIFFERENTIAL/PLATELET - Abnormal; Notable for the following:    WBC 14.6 (*)    RBC 3.35 (*)    Hemoglobin 10.8 (*)    HCT 33.6 (*)    MCV 100.3 (*)    RDW 16.9 (*)    Neutrophils Relative % 97 (*)    Neutro Abs 14.1 (*)    Lymphocytes Relative 3 (*)    Lymphs Abs 0.4 (*)    Monocytes Relative 0 (*)    All other components within normal limits  URINALYSIS, ROUTINE W REFLEX MICROSCOPIC - Abnormal; Notable for the following:    Protein, ur 30 (*)    All other components within normal limits  URINE MICROSCOPIC-ADD ON - Abnormal; Notable for the following:    Casts GRANULAR CAST (*)    All other components within normal limits  I-STAT TROPOININ, ED    Imaging Review Dg Chest 2 View  04/04/2015   CLINICAL DATA:  Atrial fibrillation.  Cough and fever for 2 days.  EXAM: CHEST  2 VIEW  COMPARISON:  Chest radiograph 03/04/2015.  CT 03/04/2015.  FINDINGS: Cardiomegaly. Unchanged LEFT subclavian Port-A-Cath with  the tip in the mid SVC. Diffuse interstitial pulmonary edema. Biliary stent seen on prior CT poorly visible. Small bleb at the LEFT lung base noted. LEFT-greater-than- RIGHT basilar atelectasis. Small bilateral pleural effusions. Density over the lower thoracic spine on the lateral view likely represents atelectasis However airspace disease is difficult to completely exclude.  IMPRESSION: Mild CHF.   Electronically Signed   By: Dereck Ligas M.D.   On: 04/04/2015 14:47   Ct Angio Chest Pe W/cm &/or Wo Cm  04/04/2015   CLINICAL DATA:  Tachycardia. History of pancreatic cancer. Evaluate for pulmonary embolism.  EXAM: CT ANGIOGRAPHY CHEST WITH CONTRAST  TECHNIQUE: Multidetector CT imaging of the chest was performed using the standard protocol during bolus administration of intravenous contrast. Multiplanar CT image reconstructions and MIPs were obtained to evaluate the vascular anatomy.  CONTRAST:  83mL OMNIPAQUE IOHEXOL 350 MG/ML SOLN  COMPARISON:  Chest CT - 03/04/2015; chest radiograph - earlier same day; CT abdomen and pelvis -03/04/2015  FINDINGS: Vascular Findings:  There is adequate opacification of the pulmonary arterial system with the main pulmonary artery measuring 221 Hounsfield units. There are no discrete filling defects within the pulmonary arterial tree the level of the bilateral subsegmental pulmonary arteries. Evaluation of distal subsegmental pulmonary arteries is degraded secondary to a combination of patient respiratory artifact and suboptimal vessel opacification.  Unchanged borderline enlarged caliber the main pulmonary artery measuring 3.6 cm in diameter (image 45, series 4).  Cardiomegaly. Coronary artery calcifications. Calcifications within the mitral valve annulus. Scattered atherosclerotic plaque within a normal caliber thoracic aorta. Conventional configuration of the aortic arch. The branch vessels of the aortic arch appear widely patent throughout their imaged course. No definite  thoracic aortic dissection or periaortic stranding.  A left anterior chest wall port a catheter tip terminates within the superior cavoatrial junction.  Review of the MIP images confirms the above findings.   ----------------------------------------------------------------------------------  Nonvascular Findings:  Evaluation of the pulmonary parenchyma is degraded secondary to patient respiratory artifact.  No change to slight interval increase in size of small bilateral effusions, left greater  than right. Bibasilar dependent ground-glass atelectasis. No discrete air bronchograms. No new focal airspace opacities.  Advanced mixed paraseptal and centrilobular emphysematous change with apical predominance is grossly unchanged. Ill-defined approximately 0.5 cm ground-glass nodule within the right upper lobe has developed since the 02/2015 examination and thus favored to be of inflammatory/infectious etiology. The central pulmonary airways appear widely patent. Partially calcified mediastinal and subcarinal lymph nodes, the sequela of prior granulomatous infection.  Scattered shotty mediastinal and right hilar lymph nodes are individually not enlarged by size criteria. No mediastinal, hilar axillary lymphadenopathy.  Limited early arterial phase evaluation of the upper abdomen demonstrates a stent within the CBD with minimal amount of pneumobilia within the nondependent portion of the left lower lobe. Ill-defined known hepatic lesions are not well demonstrated on this early arterial phase examination appear grossly unchanged. The known mass within the pancreatic head/neck junction is now well evaluated this early arterial phase though appears grossly unchanged compared to recently obtained abdominal CT (image 103, series 4) with associated downstream pancreatic atrophy, ductal dilatation and calcifications. No peripancreatic stranding. Post cholecystectomy.  No acute or aggressive osseous abnormalities.  Bilateral  gynecomastia is incidentally noted. There is mild diffuse body wall anasarca.  IMPRESSION: 1. No evidence of pulmonary embolism to the level of the bilateral subsegmental pulmonary arteries. 2. Similar findings suggestive of pulmonary edema. No change to minimal increase in size of small bilateral effusions, left greater than right, mild diffuse body wall anasarca and bibasilar atelectasis. 3. Known pancreatic mass, CBD stent and ill-defined liver lesions are incompletely evaluated though appear grossly unchanged. 4. Sequela of prior granulomatous disease as above. 5. 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   Electronically Signed   By: Sandi Mariscal M.D.   On: 04/04/2015 15:40     EKG Interpretation   Date/Time:  Thursday Apr 04 2015 15:17:17 EDT Ventricular Rate:  103 PR Interval:  161 QRS Duration: 78 QT Interval:  345 QTC Calculation: 452 R Axis:   85 Text Interpretation:  Sinus tachycardia Borderline right axis deviation  Borderline low voltage, extremity leads rate improved since last tracing  Confirmed by Debby Freiberg [phone]) on 04/04/2015 3:36:16 PM      MDM   Final diagnoses:  Typical atrial flutter    74 y.o. male with pertinent PMH of CML, HTN presents with new onset aflutter with 2:1 conductance.  Pt has had some generalized weakness, however otherwise was asymptomatic, and this was discovered incidentally by home health today.    Wu as above with initial aflutter with 2:1 conductance.  This resolved spontaneously.  Obtained PE study which was negative due to concern for underlying pathology.  Spoke with oncology and cardiology.  Cardiology recommended diltiazem, this was prescribed, as well as anticoagulation.  Pt's oncologist stated all these medications were fine.  I discussed choices of coumadin vs noac with pt and family and they elected the latter.  UA negative.  DC home in stable condition.  I have reviewed all laboratory and imaging studies  if ordered as above  1. Typical atrial flutter         Debby Freiberg, MD 04/05/15 [id number]

## 2015-04-04 NOTE — Telephone Encounter (Signed)
TC from [place], nurse with [facility], calling from pt's home. She states his heart rate is 144 and he is feeling extremely tired/fatigued the last couple of days. BP is 130/64. Denies fever or orthostatic BP changes. Spoke with Dr. Alvy Bimler. She states he needs to be evaluated in ED.  Call back to [facility] with this recommendation.  Miranda states she is in agreement and will arrange with pt.

## 2015-04-04 NOTE — ED Notes (Signed)
Spoke with Dr. Colin Rhein about medication for rate control and he reports that we will wait until after CT of chest to make sure pt does not have any pulmonary emboli.

## 2015-04-04 NOTE — Discharge Instructions (Signed)
Atrial Flutter °Atrial flutter is a heart rhythm that can cause the heart to beat very fast (tachycardia). It originates in the upper chambers of the heart (atria). In atrial flutter, the top chambers of the heart (atria) often beat much faster than the bottom chambers of the heart (ventricles). Atrial flutter has a regular "saw toothed" appearance in an EKG readout. An EKG is a test that records the electrical activity of the heart. Atrial flutter can cause the heart to beat up to 150 beats per minute (BPM). Atrial flutter can either be short lived (paroxysmal) or permanent.  °CAUSES  °Causes of atrial flutter can be many. Some of these include: °· Heart related issues: °¨ Heart attack (myocardial infarction). °¨ Heart failure. °¨ Heart valve problems. °¨ Poorly controlled high blood pressure (hypertension). °¨ After open heart surgery. °· Lung related issues: °¨ A blood clot in the lungs (pulmonary embolism). °¨ Chronic obstructive pulmonary disease (COPD). Medications used to treat COPD can attribute to atrial flutter. °· Other related causes: °¨ Hyperthyroidism. °¨ Caffeine. °¨ Some decongestant cold medications. °¨ Low electrolyte levels such as potassium or magnesium. °¨ Cocaine. °SYMPTOMS °· An awareness of your heart beating rapidly (palpitations). °· Shortness of breath. °· Chest pain. °· Low blood pressure (hypotension). °· Dizziness or fainting. °DIAGNOSIS  °Different tests can be performed to diagnose atrial flutter.  °· An EKG. °· Holter monitor. This is a 24-hour recording of your heart rhythm. You will also be given a diary. Write down all symptoms that you have and what you were doing at the time you experienced symptoms. °· Cardiac event monitor. This small device can be worn for up to 30 days. When you have heart symptoms, you will push a button on the device. This will then record your heart rhythm. °· Echocardiogram. This is an imaging test to look at your heart. Your caregiver will look at your  heart valves and the ventricles. °· Stress test. This test can help determine if the atrial flutter is related to exercise or if coronary artery disease is present. °· Laboratory studies will look at certain blood levels like: °¨ Complete blood count (CBC). °¨ Potassium. °¨ Magnesium. °¨ Thyroid function. °TREATMENT  °Treatment of atrial flutter varies. A combination of therapies may be used or sometimes atrial flutter may need only 1 type of treatment.  °Lab work: °If your blood work, such as your electrolytes (potassium, magnesium) or your thyroid function tests, are abnormal, your caregiver will treat them accordingly.  °Medication:  °There are several different types of medications that can convert your heart to a normal rhythm and prevent atrial flutter from reoccurring.  °Nonsurgical procedures: °Nonsurgical techniques may be used to control atrial flutter. Some examples include: °· Cardioversion. This technique uses either drugs or an electrical shock to restore a normal heart rhythm: °¨ Cardioversion drugs may be given through an intravenous (IV) line to help "reset" the heart rhythm. °¨ In electrical cardioversion, your caregiver shocks your heart with electrical energy. This helps to reset the heartbeat to a normal rhythm. °· Ablation. If atrial flutter is a persistent problem, an ablation may be needed. This procedure is done under mild sedation. High frequency radio-wave energy is used to destroy the area of heart tissue responsible for atrial flutter. °SEEK IMMEDIATE MEDICAL CARE IF:  °You have: °· Dizziness. °· Near fainting or fainting. °· Shortness of breath. °· Chest pain or pressure. °· Sudden nausea or vomiting. °· Profuse sweating. °If you have the above symptoms,   call your local emergency service immediately! Do not drive yourself to the hospital. °MAKE SURE YOU:  °· Understand these instructions. °· Will watch your condition. °· Will get help right away if you are not doing well or get  worse. °Document Released: 03/21/2009 Document Revised: 03/19/2014 Document Reviewed: 03/21/2009 °Exit[employer]® Patient Information ©2015 [employer]. This information is not intended to replace advice given to you by your health care provider. Make sure you discuss any questions you have with your health care provider. ° °

## 2015-04-04 NOTE — ED Notes (Signed)
Patient transported to CT 

## 2015-04-04 NOTE — ED Notes (Addendum)
Pt's heart rate 150; cancer pt; recent port a cath placed. Had chemo on Monday. Mask given for neutropenic precautions.

## 2015-04-04 NOTE — ED Notes (Signed)
Pt converted to sinus rhythm EKG captured and given to Dr. Colin Rhein.

## 2015-04-05 ENCOUNTER — Other Ambulatory Visit: Payer: Self-pay | Admitting: *Deleted

## 2015-04-05 ENCOUNTER — Ambulatory Visit: Payer: Medicare Other | Admitting: Cardiology

## 2015-04-08 ENCOUNTER — Other Ambulatory Visit (HOSPITAL_BASED_OUTPATIENT_CLINIC_OR_DEPARTMENT_OTHER): Payer: Medicare Other

## 2015-04-08 ENCOUNTER — Ambulatory Visit (HOSPITAL_BASED_OUTPATIENT_CLINIC_OR_DEPARTMENT_OTHER): Payer: Medicare Other

## 2015-04-08 ENCOUNTER — Ambulatory Visit: Payer: Medicare Other

## 2015-04-08 VITALS — BP 136/64 | HR 86 | Temp 97.9°F | Resp 18

## 2015-04-08 DIAGNOSIS — C250 Malignant neoplasm of head of pancreas: Secondary | ICD-10-CM

## 2015-04-08 DIAGNOSIS — Z5111 Encounter for antineoplastic chemotherapy: Secondary | ICD-10-CM

## 2015-04-08 DIAGNOSIS — Z95828 Presence of other vascular implants and grafts: Secondary | ICD-10-CM

## 2015-04-08 LAB — COMPREHENSIVE METABOLIC PANEL (CC13)
ALBUMIN: 3.2 g/dL — AB (ref 3.5–5.0)
ALK PHOS: 281 U/L — AB (ref 40–150)
ALT: 15 U/L (ref 0–55)
ANION GAP: 13 meq/L — AB (ref 3–11)
AST: 28 U/L (ref 5–34)
BUN: 15.3 mg/dL (ref 7.0–26.0)
CO2: 22 meq/L (ref 22–29)
CREATININE: 1.2 mg/dL (ref 0.7–1.3)
Calcium: 9.2 mg/dL (ref 8.4–10.4)
Chloride: 106 mEq/L (ref 98–109)
EGFR: 62 mL/min/{1.73_m2} — ABNORMAL LOW (ref >90)
Glucose: 209 mg/dl — ABNORMAL HIGH (ref 70–140)
POTASSIUM: 4.1 meq/L (ref 3.5–5.1)
Sodium: 141 mEq/L (ref 136–145)
TOTAL PROTEIN: 6.3 g/dL — AB (ref 6.4–8.3)
Total Bilirubin: 0.44 mg/dL (ref 0.20–1.20)

## 2015-04-08 LAB — CBC WITH DIFFERENTIAL/PLATELET
BASO%: 0.3 % (ref 0.0–2.0)
BASOS ABS: 0 10*3/uL (ref 0.0–0.1)
EOS ABS: 0.1 10*3/uL (ref 0.0–0.5)
EOS%: 0.6 % (ref 0.0–7.0)
HEMATOCRIT: 30.4 % — AB (ref 38.4–49.9)
HEMOGLOBIN: 10 g/dL — AB (ref 13.0–17.1)
LYMPH#: 1.2 10*3/uL (ref 0.9–3.3)
LYMPH%: 11.8 % — ABNORMAL LOW (ref 14.0–49.0)
MCH: 33 pg (ref 27.2–33.4)
MCHC: 32.9 g/dL (ref 32.0–36.0)
MCV: 100.3 fL — ABNORMAL HIGH (ref 79.3–98.0)
MONO#: 0.8 10*3/uL (ref 0.1–0.9)
MONO%: 7.9 % (ref 0.0–14.0)
NEUT#: 7.7 10*3/uL — ABNORMAL HIGH (ref 1.5–6.5)
NEUT%: 79.4 % — ABNORMAL HIGH (ref 39.0–75.0)
Platelets: 163 10*3/uL (ref 140–400)
RBC: 3.03 10*6/uL — AB (ref 4.20–5.82)
RDW: 16.6 % — AB (ref 11.0–14.6)
WBC: 9.7 10*3/uL (ref 4.0–10.3)

## 2015-04-08 MED ORDER — SODIUM CHLORIDE 0.9 % IJ SOLN
10.0000 mL | INTRAMUSCULAR | Status: DC | PRN
  Administered 2015-04-08 (×2): 10 mL via INTRAVENOUS
  Filled 2015-04-08: qty 10

## 2015-04-08 MED ORDER — PEGFILGRASTIM 6 MG/0.6ML ~~LOC~~ PSKT
6.0000 mg | PREFILLED_SYRINGE | Freq: Once | SUBCUTANEOUS | Status: DC
  Filled 2015-04-08: qty 0.6

## 2015-04-08 MED ORDER — PACLITAXEL PROTEIN-BOUND CHEMO INJECTION 100 MG
80.0000 mg/m2 | Freq: Once | INTRAVENOUS | Status: AC
Start: 2015-04-08 — End: 2015-04-08
  Administered 2015-04-08: 175 mg via INTRAVENOUS
  Filled 2015-04-08: qty 35

## 2015-04-08 MED ORDER — SODIUM CHLORIDE 0.9 % IV SOLN
500.0000 mg/m2 | Freq: Once | INTRAVENOUS | Status: AC
  Administered 2015-04-08: 1140 mg via INTRAVENOUS
  Filled 2015-04-08: qty 29.98

## 2015-04-08 MED ORDER — SODIUM CHLORIDE 0.9 % IJ SOLN
10.0000 mL | INTRAMUSCULAR | Status: DC | PRN
  Filled 2015-04-08: qty 10

## 2015-04-08 MED ORDER — SODIUM CHLORIDE 0.9 % IV SOLN
Freq: Once | INTRAVENOUS | Status: AC
  Administered 2015-04-08: 11:00:00 via INTRAVENOUS
  Filled 2015-04-08: qty 4

## 2015-04-08 MED ORDER — SODIUM CHLORIDE 0.9 % IV SOLN
Freq: Once | INTRAVENOUS | Status: AC
  Administered 2015-04-08: 11:00:00 via INTRAVENOUS

## 2015-04-08 MED ORDER — HEPARIN SOD (PORK) LOCK FLUSH 100 UNIT/ML IV SOLN
500.0000 [IU] | Freq: Once | INTRAVENOUS | Status: AC | PRN
  Administered 2015-04-08: 500 [IU]
  Filled 2015-04-08: qty 5

## 2015-04-08 NOTE — Patient Instructions (Signed)
[facility] Discharge Instructions for Patients Receiving Chemotherapy  Today you received the following chemotherapy agents:  Abraxane and Gemzar.  To help prevent nausea and vomiting after your treatment, we encourage you to take your nausea medication as prescribed.   If you develop nausea and vomiting that is not controlled by your nausea medication, call the clinic.   BELOW ARE SYMPTOMS THAT SHOULD BE REPORTED IMMEDIATELY:  *FEVER GREATER THAN 100.5 F  *CHILLS WITH OR WITHOUT FEVER  NAUSEA AND VOMITING THAT IS NOT CONTROLLED WITH YOUR NAUSEA MEDICATION  *UNUSUAL SHORTNESS OF BREATH  *UNUSUAL BRUISING OR BLEEDING  TENDERNESS IN MOUTH AND THROAT WITH OR WITHOUT PRESENCE OF ULCERS  *URINARY PROBLEMS  *BOWEL PROBLEMS  UNUSUAL RASH Items with * indicate a potential emergency and should be followed up as soon as possible.  Feel free to call the clinic you have any questions or concerns. The clinic phone number is [phone].  Please show the CHEMO ALERT CARD at check-in to the Emergency Department and triage nurse.   

## 2015-04-08 NOTE — Patient Instructions (Signed)

## 2015-04-09 ENCOUNTER — Ambulatory Visit (HOSPITAL_BASED_OUTPATIENT_CLINIC_OR_DEPARTMENT_OTHER): Payer: Medicare Other

## 2015-04-09 VITALS — BP 149/70 | HR 85 | Temp 98.5°F | Resp 20

## 2015-04-09 DIAGNOSIS — C250 Malignant neoplasm of head of pancreas: Secondary | ICD-10-CM | POA: Diagnosis present

## 2015-04-09 DIAGNOSIS — Z5189 Encounter for other specified aftercare: Secondary | ICD-10-CM | POA: Diagnosis not present

## 2015-04-09 LAB — CANCER ANTIGEN 19-9: CA 19-9: 793.9 U/mL — ABNORMAL HIGH (ref <35.0)

## 2015-04-09 MED ORDER — PEGFILGRASTIM INJECTION 6 MG/0.6ML ~~LOC~~
6.0000 mg | PREFILLED_SYRINGE | Freq: Once | SUBCUTANEOUS | Status: AC
  Administered 2015-04-09: 6 mg via SUBCUTANEOUS
  Filled 2015-04-09: qty 0.6

## 2015-04-09 NOTE — Progress Notes (Signed)
Patient ID: Evan [name], male   DOB: [birth year], 74 y.o.   MRN: [id number]     Cardiology Office Note   Date:  04/09/2015   ID:  Evan [name], DOB 03-10-1941, MRN [id number]  PCP:  [name], MD  Cardiologist:   Jenkins Rouge, MD   No chief complaint on file.     History of Present Illness: Evan [name] is a 74 y.o. male who presents for evaluation of atrial flutter.  Felt weak and seen in ER 5/19.  No chest pain.  Converted spontaneously on way to CT.  CT negative for PE But did show effusions ? Anasarca and patient apparently has metastatic pancreatic CA.  Getting chemo Anemic with no bleeding issues yet.  Started on xarelto and cardizem in ER after they Spoke with oncology and Dr Marlou Porch   He is totally unaware of irregular rhythm  No palpitations chest pain or dyspnea.  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 Chronic back pain greater than 3 months duration   . Bulging discs   . Diabetes mellitus     type II; neuropathy;   . Hypertension   . High cholesterol   . Leukocytosis   . GERD (gastroesophageal reflux disease)   . Pancreatitis 2004  . Anxiety   . DDD (degenerative disc disease) 2005    lumbar spine  . History of smoking 08/01/2012  . Weight loss 08/01/2012  . Cough 08/01/2012  . Left leg pain 08/05/2012  . Chronic pancreatitis   . OSA (obstructive sleep apnea) 2010    does not use CPAP  . Nausea alone 06/13/2014  . Allergy   . Mucositis (ulcerative) due to antineoplastic therapy 07/02/2014  . Constipated 07/16/2014  . Gastric ulcer 08/23/2014  . Hx of radiation therapy 06/25/14-08/06/14    pancreas head, 50.4Gy  . 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 Pancreatic cancer 06/08/2014  . Chest pain 11/05/2014  . Tachycardia 03/04/2015    Past Surgical History  Procedure Laterality Date  .  Cholecystectomy      laparoscopic  . Hernia repair      umbilical  . Skin cancer removed  2012    right ear, basal cell carcinoma.   Marland Kitchen Spinal stretching    . Colonoscopy  2012    [facility]  . Eus Left 06/07/2014    Procedure: UPPER ENDOSCOPIC ULTRASOUND (EUS) LINEAR;  Surgeon: Arta Silence, MD;  Location: [facility] ENDOSCOPY;  Service: Endoscopy;  Laterality: Left;  . Ercp N/A 06/07/2014    Procedure: ENDOSCOPIC RETROGRADE CHOLANGIOPANCREATOGRAPHY (ERCP);  Surgeon: Arta Silence, MD;  Location: [facility];  Service: Endoscopy;  Laterality: N/A;  . Eye surgery      bilateral cataracts with lens implant  . Cataract extraction Bilateral   . Port acath placement Left 06/14/14  . Portacath placement Left 06/14/2014    Procedure: INSERTION PORT-A-CATH;  Surgeon: Stark Klein, MD;  Location: [facility] ORS;  Service: General;  Laterality: Left;  . Esophagogastroduodenoscopy N/A 08/15/2014    Procedure: ESOPHAGOGASTRODUODENOSCOPY (EGD);  Surgeon: Lear Ng, MD;  Location: [facility];  Service: Endoscopy;  Laterality: N/A;     Current Outpatient Prescriptions  Medication Sig Dispense Refill  . acetaminophen (TYLENOL) 500 MG tablet Take 1,000 mg by mouth every 6 (six) hours as needed for fever.    Marland Kitchen aspirin EC 81  MG tablet Take 81 mg by mouth daily.    . B Complex-C (SUPER B COMPLEX/VITAMIN C PO) Take 1 tablet by mouth daily.     . cholecalciferol (VITAMIN D) 1000 UNITS tablet Take 1,000 Units by mouth daily.    . dasatinib (SPRYCEL) 50 MG tablet Take 1 tablet (50 mg total) by mouth daily. 30 tablet 2  . diltiazem (CARDIZEM CD) 120 MG 24 hr capsule Take 1 capsule (120 mg total) by mouth daily. 30 capsule 0  . insulin glargine (LANTUS) 100 UNIT/ML injection Inject 26 Units into the skin at bedtime.     Marland Kitchen lactulose (CHRONULAC) 10 GM/15ML solution Take 10 g by mouth 2 (two) times daily as needed for moderate constipation.     Marland Kitchen losartan (COZAAR) 50 MG tablet Take 50 mg by mouth daily.  11  . magnesium  oxide (MAG-OX) 400 MG tablet Take 1 tablet (400 mg total) by mouth daily. 30 tablet 3  . metFORMIN (GLUCOPHAGE) 1000 MG tablet Take 1,000 mg by mouth 2 (two) times daily with a meal.    . pantoprazole (PROTONIX) 40 MG tablet Take 1 tablet (40 mg total) by mouth daily. 60 tablet 6  . promethazine (PHENERGAN) 25 MG tablet Take 25 mg by mouth every 6 (six) hours as needed for nausea or vomiting (nausea).    . Rivaroxaban (XARELTO) 15 MG TABS tablet Take 1 tablet (15 mg total) by mouth daily with supper. 30 tablet 0  . sennosides-docusate sodium (SENOKOT-S) 8.6-50 MG tablet Take 2 tablets by mouth at bedtime.     . simvastatin (ZOCOR) 20 MG tablet Take 20 mg by mouth daily.     . traMADol (ULTRAM) 50 MG tablet Take 1 tablet (50 mg total) by mouth every 6 (six) hours as needed. (Patient taking differently: Take 50 mg by mouth every 6 (six) hours as needed for moderate pain. ) 30 tablet 0  . vitamin B-12 (CYANOCOBALAMIN) 1000 MCG tablet Take 1,000 mcg by mouth daily.     No current facility-administered medications for this visit.    Allergies:   Ace inhibitors and Ciprofloxacin    Social History:  The patient  reports that he quit smoking about 11 years ago. He has never used smokeless tobacco. He reports that he does not drink alcohol or use illicit drugs.   Family History:  The patient's family history includes Cancer (age of onset: 67) in his mother; Heart attack in his father and mother; Stroke in his father.    ROS:  Please see the history of present illness.   Otherwise, review of systems are positive for none.   All other systems are reviewed and negative.    PHYSICAL EXAM: VS:  There were no vitals taken for this visit. , BMI There is no weight on file to calculate BMI. Affect appropriate Pale chronically ill paient  HEENT: normal Neck supple with no adenopathy JVP normal no bruits no thyromegaly Lungs decreased BS bases no wheezing and good diaphragmatic motion Heart:  S1/S2 no  murmur, no rub, gallop or click PMI normal Abdomen: benighn, BS positve, no tenderness, no AAA no bruit.  No HSM or HJR Distal pulses intact with no bruits No edema Neuro non-focal Skin warm and dry No muscular weakness    EKG:  Atrial flutter rate 147  Otherwise normal  04/10/15  SR rate 90  PVC otherwise normal    Recent Labs: 03/04/2015: B Natriuretic Peptide 295.2*; Magnesium 1.1*; TSH 2.552 04/08/2015: ALT 15; BUN 15.3;  Creatinine 1.2; Hemoglobin 10.0*; Platelets 163; Potassium 4.1; Sodium 141    Lipid Panel    Component Value Date/Time   CHOL  09/10/2010 0650    93        ATP III CLASSIFICATION:  <200     mg/dL   Desirable  200-239  mg/dL   Borderline High  >=240    mg/dL   High          TRIG 169* 08/20/2014 0400   HDL 32* 09/10/2010 0650   CHOLHDL 2.9 09/10/2010 0650   VLDL 25 09/10/2010 0650   LDLCALC  09/10/2010 0650    36        Total Cholesterol/HDL:CHD Risk Coronary Heart Disease Risk Table                     Men   Women  1/2 Average Risk   3.4   3.3  Average Risk       5.0   4.4  2 X Average Risk   9.6   7.1  3 X Average Risk  23.4   11.0        Use the calculated Patient Ratio above and the CHD Risk Table to determine the patient's CHD Risk.        ATP III CLASSIFICATION (LDL):  <100     mg/dL   Optimal  100-129  mg/dL   Near or Above                    Optimal  130-159  mg/dL   Borderline  160-189  mg/dL   High  >190     mg/dL   Very High      Wt Readings from Last 3 Encounters:  04/01/15 105.688 kg (233 lb)  03/06/15 103.193 kg (227 lb 8 oz)  03/04/15 100.699 kg (222 lb)      Other studies Reviewed: Additional studies/ records that were reviewed today include: EPIC notes labs and xrays.    ASSESSMENT AND PLAN:  1.  PAF:  Flutter  Asymptomatic  Check echo for EF  Continue low dose xarelto 15 mg and cardizem  D/C aspirin  2. Pancreatic CA  Guarded prognosis and has been to [facility] for 2nd opinion but [facility] not possible Due to lung  mets  Suspect anasarca due to cancer not heart 3.  GERD  Continue protonix 4. HTN:  Well controlled.  Continue current medications and low sodium Dash type diet.      Current medicines are reviewed at length with the patient today.  The patient does not have concerns regarding medicines.  The following changes have been made:  no change  Labs/ tests ordered today include:  Echo  No orders of the defined types were placed in this encounter.     Disposition:   FU with me in 3 months      Signed, Jenkins Rouge, MD  04/09/2015 5:36 PM    [facility] [address], [place], [place]  [address] Phone: [phone]; Fax: [phone]

## 2015-04-10 ENCOUNTER — Ambulatory Visit: Payer: Medicare Other | Admitting: Cardiovascular Disease

## 2015-04-10 ENCOUNTER — Ambulatory Visit (INDEPENDENT_AMBULATORY_CARE_PROVIDER_SITE_OTHER): Payer: Medicare Other | Admitting: Cardiovascular Disease

## 2015-04-10 ENCOUNTER — Encounter: Payer: Self-pay | Admitting: Cardiovascular Disease

## 2015-04-10 VITALS — BP 94/50 | HR 87 | Ht 74.0 in | Wt 233.8 lb

## 2015-04-10 DIAGNOSIS — I493 Ventricular premature depolarization: Secondary | ICD-10-CM

## 2015-04-10 DIAGNOSIS — I4892 Unspecified atrial flutter: Secondary | ICD-10-CM | POA: Diagnosis not present

## 2015-04-10 NOTE — Patient Instructions (Signed)
Medication Instructions:  NO CHANGES  Labwork: NONE  Testing/Procedures: Your physician has requested that you have an echocardiogram. Echocardiography is a painless test that uses sound waves to create images of your heart. It provides your doctor with information about the size and shape of your heart and how well your heart's chambers and valves are working. This procedure takes approximately one hour. There are no restrictions for this procedure.   Follow-Up:  Your physician recommends that you schedule a follow-up appointment in: [place] Any Other Special Instructions Will Be Listed Below (If Applicable).

## 2015-04-12 ENCOUNTER — Other Ambulatory Visit: Payer: Self-pay

## 2015-04-12 ENCOUNTER — Ambulatory Visit (HOSPITAL_COMMUNITY): Payer: Medicare Other | Attending: Cardiology

## 2015-04-12 DIAGNOSIS — I10 Essential (primary) hypertension: Secondary | ICD-10-CM | POA: Insufficient documentation

## 2015-04-12 DIAGNOSIS — Z794 Long term (current) use of insulin: Secondary | ICD-10-CM | POA: Insufficient documentation

## 2015-04-12 DIAGNOSIS — I4892 Unspecified atrial flutter: Secondary | ICD-10-CM | POA: Insufficient documentation

## 2015-04-12 DIAGNOSIS — E119 Type 2 diabetes mellitus without complications: Secondary | ICD-10-CM | POA: Diagnosis not present

## 2015-04-12 DIAGNOSIS — Z87891 Personal history of nicotine dependence: Secondary | ICD-10-CM | POA: Insufficient documentation

## 2015-04-12 DIAGNOSIS — E785 Hyperlipidemia, unspecified: Secondary | ICD-10-CM | POA: Insufficient documentation

## 2015-04-22 ENCOUNTER — Ambulatory Visit (HOSPITAL_BASED_OUTPATIENT_CLINIC_OR_DEPARTMENT_OTHER): Payer: Medicare Other

## 2015-04-22 ENCOUNTER — Other Ambulatory Visit (HOSPITAL_BASED_OUTPATIENT_CLINIC_OR_DEPARTMENT_OTHER): Payer: Medicare Other

## 2015-04-22 ENCOUNTER — Ambulatory Visit: Payer: Medicare Other

## 2015-04-22 ENCOUNTER — Other Ambulatory Visit: Payer: Self-pay | Admitting: *Deleted

## 2015-04-22 VITALS — BP 139/59 | HR 78 | Temp 98.3°F | Resp 20

## 2015-04-22 DIAGNOSIS — C250 Malignant neoplasm of head of pancreas: Secondary | ICD-10-CM

## 2015-04-22 DIAGNOSIS — Z5111 Encounter for antineoplastic chemotherapy: Secondary | ICD-10-CM

## 2015-04-22 DIAGNOSIS — Z95828 Presence of other vascular implants and grafts: Secondary | ICD-10-CM

## 2015-04-22 LAB — CBC WITH DIFFERENTIAL/PLATELET
BASO%: 0.4 % (ref 0.0–2.0)
Basophils Absolute: 0.1 10*3/uL (ref 0.0–0.1)
EOS ABS: 0.1 10*3/uL (ref 0.0–0.5)
EOS%: 0.8 % (ref 0.0–7.0)
HCT: 30 % — ABNORMAL LOW (ref 38.4–49.9)
HGB: 9.8 g/dL — ABNORMAL LOW (ref 13.0–17.1)
LYMPH%: 6.4 % — ABNORMAL LOW (ref 14.0–49.0)
MCH: 32.7 pg (ref 27.2–33.4)
MCHC: 32.8 g/dL (ref 32.0–36.0)
MCV: 99.7 fL — AB (ref 79.3–98.0)
MONO#: 0.9 10*3/uL (ref 0.1–0.9)
MONO%: 5.4 % (ref 0.0–14.0)
NEUT#: 14.9 10*3/uL — ABNORMAL HIGH (ref 1.5–6.5)
NEUT%: 87 % — ABNORMAL HIGH (ref 39.0–75.0)
Platelets: 298 10*3/uL (ref 140–400)
RBC: 3 10*6/uL — AB (ref 4.20–5.82)
RDW: 18.7 % — AB (ref 11.0–14.6)
WBC: 17.1 10*3/uL — ABNORMAL HIGH (ref 4.0–10.3)
lymph#: 1.1 10*3/uL (ref 0.9–3.3)

## 2015-04-22 LAB — COMPREHENSIVE METABOLIC PANEL (CC13)
ALT: 13 U/L (ref 0–55)
ANION GAP: 10 meq/L (ref 3–11)
AST: 22 U/L (ref 5–34)
Albumin: 3 g/dL — ABNORMAL LOW (ref 3.5–5.0)
Alkaline Phosphatase: 285 U/L — ABNORMAL HIGH (ref 40–150)
BILIRUBIN TOTAL: 0.41 mg/dL (ref 0.20–1.20)
BUN: 13.4 mg/dL (ref 7.0–26.0)
CO2: 23 mEq/L (ref 22–29)
Calcium: 9.4 mg/dL (ref 8.4–10.4)
Chloride: 109 mEq/L (ref 98–109)
Creatinine: 1.1 mg/dL (ref 0.7–1.3)
EGFR: 67 mL/min/{1.73_m2} — ABNORMAL LOW (ref >90)
Glucose: 122 mg/dl (ref 70–140)
Potassium: 3.8 mEq/L (ref 3.5–5.1)
Sodium: 142 mEq/L (ref 136–145)
Total Protein: 6.2 g/dL — ABNORMAL LOW (ref 6.4–8.3)

## 2015-04-22 MED ORDER — SODIUM CHLORIDE 0.9 % IJ SOLN
10.0000 mL | INTRAMUSCULAR | Status: DC | PRN
  Administered 2015-04-22: 10 mL via INTRAVENOUS
  Filled 2015-04-22: qty 10

## 2015-04-22 MED ORDER — RIVAROXABAN 15 MG PO TABS: 15.0000 mg | ORAL_TABLET | Freq: Every day | ORAL | Status: AC

## 2015-04-22 MED ORDER — SODIUM CHLORIDE 0.9 % IV SOLN
Freq: Once | INTRAVENOUS | Status: AC
  Administered 2015-04-22: 10:00:00 via INTRAVENOUS
  Filled 2015-04-22: qty 4

## 2015-04-22 MED ORDER — PACLITAXEL PROTEIN-BOUND CHEMO INJECTION 100 MG
80.0000 mg/m2 | Freq: Once | INTRAVENOUS | Status: AC
  Administered 2015-04-22: 175 mg via INTRAVENOUS
  Filled 2015-04-22: qty 35

## 2015-04-22 MED ORDER — DILTIAZEM HCL ER COATED BEADS 120 MG PO CP24: 120.0000 mg | ORAL_CAPSULE | Freq: Every day | ORAL | Status: AC

## 2015-04-22 MED ORDER — SODIUM CHLORIDE 0.9 % IV SOLN
Freq: Once | INTRAVENOUS | Status: AC
  Administered 2015-04-22: 10:00:00 via INTRAVENOUS

## 2015-04-22 MED ORDER — SODIUM CHLORIDE 0.9 % IJ SOLN
10.0000 mL | INTRAMUSCULAR | Status: DC | PRN
  Administered 2015-04-22: 10 mL
  Filled 2015-04-22: qty 10

## 2015-04-22 MED ORDER — HEPARIN SOD (PORK) LOCK FLUSH 100 UNIT/ML IV SOLN
500.0000 [IU] | Freq: Once | INTRAVENOUS | Status: AC | PRN
  Administered 2015-04-22: 500 [IU]
  Filled 2015-04-22: qty 5

## 2015-04-22 MED ORDER — SODIUM CHLORIDE 0.9 % IV SOLN
500.0000 mg/m2 | Freq: Once | INTRAVENOUS | Status: AC
  Administered 2015-04-22: 1140 mg via INTRAVENOUS
  Filled 2015-04-22: qty 29.98

## 2015-04-22 NOTE — Patient Instructions (Signed)

## 2015-04-22 NOTE — Patient Instructions (Signed)
[facility] Discharge Instructions for Patients Receiving Chemotherapy  Today you received the following chemotherapy agents:  Abraxane and Gemzar.  To help prevent nausea and vomiting after your treatment, we encourage you to take your nausea medication as prescribed.   If you develop nausea and vomiting that is not controlled by your nausea medication, call the clinic.   BELOW ARE SYMPTOMS THAT SHOULD BE REPORTED IMMEDIATELY:  *FEVER GREATER THAN 100.5 F  *CHILLS WITH OR WITHOUT FEVER  NAUSEA AND VOMITING THAT IS NOT CONTROLLED WITH YOUR NAUSEA MEDICATION  *UNUSUAL SHORTNESS OF BREATH  *UNUSUAL BRUISING OR BLEEDING  TENDERNESS IN MOUTH AND THROAT WITH OR WITHOUT PRESENCE OF ULCERS  *URINARY PROBLEMS  *BOWEL PROBLEMS  UNUSUAL RASH Items with * indicate a potential emergency and should be followed up as soon as possible.  Feel free to call the clinic you have any questions or concerns. The clinic phone number is [phone].  Please show the CHEMO ALERT CARD at check-in to the Emergency Department and triage nurse.   

## 2015-04-24 ENCOUNTER — Other Ambulatory Visit: Payer: Self-pay | Admitting: *Deleted

## 2015-04-24 MED ORDER — DASATINIB 50 MG PO TABS: 50.0000 mg | ORAL_TABLET | Freq: Every day | ORAL | Status: DC

## 2015-04-27 ENCOUNTER — Other Ambulatory Visit: Payer: Self-pay | Admitting: Hematology and Oncology

## 2015-04-29 ENCOUNTER — Encounter: Payer: Self-pay | Admitting: Hematology and Oncology

## 2015-04-29 ENCOUNTER — Telehealth: Payer: Self-pay | Admitting: Hematology and Oncology

## 2015-04-29 ENCOUNTER — Ambulatory Visit: Payer: Medicare Other

## 2015-04-29 ENCOUNTER — Other Ambulatory Visit (HOSPITAL_BASED_OUTPATIENT_CLINIC_OR_DEPARTMENT_OTHER): Payer: Medicare Other

## 2015-04-29 ENCOUNTER — Ambulatory Visit (HOSPITAL_BASED_OUTPATIENT_CLINIC_OR_DEPARTMENT_OTHER): Payer: Medicare Other | Admitting: Hematology and Oncology

## 2015-04-29 ENCOUNTER — Ambulatory Visit (HOSPITAL_BASED_OUTPATIENT_CLINIC_OR_DEPARTMENT_OTHER): Payer: Medicare Other

## 2015-04-29 VITALS — BP 139/64 | HR 80 | Temp 98.1°F | Resp 18 | Ht 74.0 in | Wt 228.1 lb

## 2015-04-29 DIAGNOSIS — C250 Malignant neoplasm of head of pancreas: Secondary | ICD-10-CM

## 2015-04-29 DIAGNOSIS — G620 Drug-induced polyneuropathy: Secondary | ICD-10-CM | POA: Diagnosis not present

## 2015-04-29 DIAGNOSIS — R600 Localized edema: Secondary | ICD-10-CM | POA: Diagnosis not present

## 2015-04-29 DIAGNOSIS — I480 Paroxysmal atrial fibrillation: Secondary | ICD-10-CM

## 2015-04-29 DIAGNOSIS — Z5111 Encounter for antineoplastic chemotherapy: Secondary | ICD-10-CM | POA: Diagnosis not present

## 2015-04-29 DIAGNOSIS — T451X5A Adverse effect of antineoplastic and immunosuppressive drugs, initial encounter: Secondary | ICD-10-CM

## 2015-04-29 DIAGNOSIS — M25511 Pain in right shoulder: Secondary | ICD-10-CM | POA: Diagnosis not present

## 2015-04-29 DIAGNOSIS — Z95828 Presence of other vascular implants and grafts: Secondary | ICD-10-CM

## 2015-04-29 DIAGNOSIS — D630 Anemia in neoplastic disease: Secondary | ICD-10-CM

## 2015-04-29 DIAGNOSIS — G8929 Other chronic pain: Secondary | ICD-10-CM | POA: Diagnosis not present

## 2015-04-29 LAB — COMPREHENSIVE METABOLIC PANEL (CC13)
ALT: 16 U/L (ref 0–55)
AST: 26 U/L (ref 5–34)
Albumin: 3.1 g/dL — ABNORMAL LOW (ref 3.5–5.0)
Alkaline Phosphatase: 269 U/L — ABNORMAL HIGH (ref 40–150)
Anion Gap: 11 mEq/L (ref 3–11)
BUN: 14 mg/dL (ref 7.0–26.0)
CO2: 21 meq/L — AB (ref 22–29)
CREATININE: 1.1 mg/dL (ref 0.7–1.3)
Calcium: 9.5 mg/dL (ref 8.4–10.4)
Chloride: 108 mEq/L (ref 98–109)
EGFR: 64 mL/min/{1.73_m2} — AB (ref >90)
Glucose: 139 mg/dl (ref 70–140)
Potassium: 3.9 mEq/L (ref 3.5–5.1)
Sodium: 139 mEq/L (ref 136–145)
Total Bilirubin: 0.48 mg/dL (ref 0.20–1.20)
Total Protein: 6.1 g/dL — ABNORMAL LOW (ref 6.4–8.3)

## 2015-04-29 LAB — CBC WITH DIFFERENTIAL/PLATELET
BASO%: 0.9 % (ref 0.0–2.0)
Basophils Absolute: 0.1 10*3/uL (ref 0.0–0.1)
EOS%: 0.6 % (ref 0.0–7.0)
Eosinophils Absolute: 0 10*3/uL (ref 0.0–0.5)
HEMATOCRIT: 27 % — AB (ref 38.4–49.9)
HGB: 9.1 g/dL — ABNORMAL LOW (ref 13.0–17.1)
LYMPH%: 9.5 % — ABNORMAL LOW (ref 14.0–49.0)
MCH: 33.6 pg — ABNORMAL HIGH (ref 27.2–33.4)
MCHC: 33.7 g/dL (ref 32.0–36.0)
MCV: 99.5 fL — ABNORMAL HIGH (ref 79.3–98.0)
MONO#: 0.7 10*3/uL (ref 0.1–0.9)
MONO%: 9.5 % (ref 0.0–14.0)
NEUT#: 5.9 10*3/uL (ref 1.5–6.5)
NEUT%: 79.5 % — AB (ref 39.0–75.0)
Platelets: 165 10*3/uL (ref 140–400)
RBC: 2.71 10*6/uL — AB (ref 4.20–5.82)
RDW: 17.5 % — ABNORMAL HIGH (ref 11.0–14.6)
WBC: 7.5 10*3/uL (ref 4.0–10.3)
lymph#: 0.7 10*3/uL — ABNORMAL LOW (ref 0.9–3.3)

## 2015-04-29 MED ORDER — SODIUM CHLORIDE 0.9 % IV SOLN
Freq: Once | INTRAVENOUS | Status: AC
  Administered 2015-04-29: 11:00:00 via INTRAVENOUS
  Filled 2015-04-29: qty 4

## 2015-04-29 MED ORDER — PACLITAXEL PROTEIN-BOUND CHEMO INJECTION 100 MG
80.0000 mg/m2 | Freq: Once | INTRAVENOUS | Status: AC
  Administered 2015-04-29: 175 mg via INTRAVENOUS
  Filled 2015-04-29: qty 35

## 2015-04-29 MED ORDER — SODIUM CHLORIDE 0.9 % IV SOLN
Freq: Once | INTRAVENOUS | Status: AC
  Administered 2015-04-29: 11:00:00 via INTRAVENOUS

## 2015-04-29 MED ORDER — SODIUM CHLORIDE 0.9 % IJ SOLN
10.0000 mL | INTRAMUSCULAR | Status: DC | PRN
  Administered 2015-04-29: 10 mL via INTRAVENOUS
  Filled 2015-04-29: qty 10

## 2015-04-29 MED ORDER — TRAMADOL HCL 50 MG PO TABS: 50.0000 mg | ORAL_TABLET | Freq: Four times a day (QID) | ORAL | Status: AC | PRN

## 2015-04-29 MED ORDER — HEPARIN SOD (PORK) LOCK FLUSH 100 UNIT/ML IV SOLN
500.0000 [IU] | Freq: Once | INTRAVENOUS | Status: AC | PRN
  Administered 2015-04-29: 500 [IU]
  Filled 2015-04-29: qty 5

## 2015-04-29 MED ORDER — SODIUM CHLORIDE 0.9 % IV SOLN
500.0000 mg/m2 | Freq: Once | INTRAVENOUS | Status: AC
  Administered 2015-04-29: 1140 mg via INTRAVENOUS
  Filled 2015-04-29: qty 29.98

## 2015-04-29 MED ORDER — SODIUM CHLORIDE 0.9 % IJ SOLN
10.0000 mL | INTRAMUSCULAR | Status: DC | PRN
  Administered 2015-04-29: 10 mL
  Filled 2015-04-29: qty 10

## 2015-04-29 NOTE — Telephone Encounter (Signed)
Gave and printed appt sched adn avs for pt for June thru Aug

## 2015-04-29 NOTE — Assessment & Plan Note (Signed)
He has paroxsymal atrial fibrillation recently. EMT documented atrial fibrillation but EKG at the emergency department showed normal sinus rhythm. BNP was mildly elevated in the ER but he has resolved back to normal. He follows with cardiologist. Of note, recent thyroid function tests were within normal limits He will continue medical management and anticoagulation therapy as directed.

## 2015-04-29 NOTE — Assessment & Plan Note (Signed)
I reviewed the last 2 CT scan of the chest with particular attention to the right shoulder area. Physical examination confirmed localized pain which is likely related to degenerative arthritis. 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 His pain appeared to be under control with tramadol as needed.

## 2015-04-29 NOTE — Patient Instructions (Signed)
[facility] Discharge Instructions for Patients Receiving Chemotherapy  Today you received the following chemotherapy agents abraxane/gemzar   To help prevent nausea and vomiting after your treatment, we encourage you to take your nausea medication as directed.   If you develop nausea and vomiting that is not controlled by your nausea medication, call the clinic.   BELOW ARE SYMPTOMS THAT SHOULD BE REPORTED IMMEDIATELY:  *FEVER GREATER THAN 100.5 F  *CHILLS WITH OR WITHOUT FEVER  NAUSEA AND VOMITING THAT IS NOT CONTROLLED WITH YOUR NAUSEA MEDICATION  *UNUSUAL SHORTNESS OF BREATH  *UNUSUAL BRUISING OR BLEEDING  TENDERNESS IN MOUTH AND THROAT WITH OR WITHOUT PRESENCE OF ULCERS  *URINARY PROBLEMS  *BOWEL PROBLEMS  UNUSUAL RASH Items with * indicate a potential emergency and should be followed up as soon as possible.  Feel free to call the clinic you have any questions or concerns. The clinic phone number is [phone].  

## 2015-04-29 NOTE — Assessment & Plan Note (Signed)
After I reduced the dose of chemotherapy with Abraxane 20% along with reduced dose Gemzar by 50% plus G-CSF support with each cycle,  He tolerated that better. Recent repeat CT scan show reduction in the size of cancer I will recommend a few more cycles of treatment and repeat imaging in July 2016. I noted that the tumor markers fluctuated up and down. I discussed with the patient and his wife the limitation of tumor marker monitoring.

## 2015-04-29 NOTE — Assessment & Plan Note (Signed)
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 He will continue dose adjusted treatment with abraxane

## 2015-04-29 NOTE — Assessment & Plan Note (Signed)
This is related to low albumin state. I recommend cutting down on salt ingestion and elastic compression hose.

## 2015-04-29 NOTE — Patient Instructions (Signed)

## 2015-04-29 NOTE — Assessment & Plan Note (Signed)
This is likely due to recent treatment. The patient denies recent history of bleeding such as epistaxis, hematuria or hematochezia. [name] I will observe for now.  He does not require transfusion now. 

## 2015-04-29 NOTE — Progress Notes (Signed)
[facility] OFFICE PROGRESS NOTE  Patient Care Team: Lavone Orn, MD as PCP - General (Internal Medicine) [name], MD as Referring Physician (Specialist) Provider Not In System [name], MD as Consulting Physician (Hematology and Oncology)  SUMMARY OF ONCOLOGIC HISTORY: Oncology History   Pancreatic cancer   Primary site: Pancreas   Staging method: AJCC 7th Edition   Clinical: Stage IIA (T3, N0, M0) signed by [name], MD on 06/13/2014  4:37 PM   Summary: Stage IIA (T3, N0, M0)       Malignant neoplasm of head of pancreas   06/05/2014 Imaging  MRI abdomen showed 2.2 cm lesion in the pancreatic head obstructing the common bile duct and the pancreatic duct is highly concerning for pancreatic adenocarcinoma.  There is moderate intra and extrahepatic biliary duct dilatation.    06/05/2014 Tumor Marker CA 19-9 is elevated at 1234   06/06/2014 Imaging Ct scan of chest showed 2 lung nodules, likely benign   06/07/2014 Procedure He had ERCP and stent placement   06/07/2014 Procedure He had endoscopic ultrasound with  fine needle aspiration.   06/07/2014 Pathology Results Accession: [id number] FNA is positive for pancreatic adenoca   06/14/2014 Surgery He has port placement   06/25/2014 - 08/01/2014 Chemotherapy He received neoadjuvant chemotherapy with Gemzar   06/25/2014 - 08/06/2014 Radiation Therapy he received neoadjuvant radiation: 50.4 Gy   08/13/2014 - 08/23/2014 Hospital Admission The patient was admitted to the hospital related to uncontrolled nausea, vomiting and severe epigastric pain and was found to have significant gastric ulcer, resolved with conservative management and total parenteral nutrition   09/06/2014 Imaging Repeat CT scan show enlarging lung nodule, suspicious for early metastatic disease.   10/18/2014 Imaging CT scan of the chest, abdomen and pelvis show metastatic disease in the lung and liver.   10/18/2014 Tumor Marker CA-19-9 at 1745   10/22/2014 - 12/03/2014  Chemotherapy The patient is placed on modified FOLFOX chemotherapy   11/05/2014 Tumor Marker Ca19-9 at 2447   12/14/2014 Imaging CT scan showed disease progression in the liver   12/14/2014 Tumor Marker CA 19-9 at 2050   12/17/2014 -  Chemotherapy the patient was starting on Abraxane with reduced dose Gemzar   12/24/2014 Tumor Marker CA-19-9 is at 1387.   12/26/2014 - 12/29/2014 Hospital Admission the patient was admitted to the hospital for hospital-acquired pneumonia.   01/07/2015 Adverse Reaction cycle 2 of treatment with reduced dose Abraxane due to recent hospitalization   01/12/2015 - 01/16/2015 Hospital Admission  the patient was admitted to the hospital again for hospital acquired pneumonia.   03/04/2015 Imaging Repeat CT scans show improvement of disease control.    INTERVAL HISTORY: Please see below for problem oriented charting. He returns for further follow-up. He denies further chest pain or shortness of breath. He had persistent peripheral neuropathy. His shoulder pain is controlled with Tramadol. Bilateral lower extremity edema but it does not bother him. He feels well otherwise.  REVIEW OF SYSTEMS:   Constitutional: Denies fevers, chills or abnormal weight loss Eyes: Denies blurriness of vision Ears, nose, mouth, throat, and face: Denies mucositis or sore throat Respiratory: Denies cough, dyspnea or wheezes Cardiovascular: Denies palpitation, chest discomfort  Gastrointestinal:  Denies nausea, heartburn or change in bowel habits Skin: Denies abnormal skin rashes Lymphatics: Denies new lymphadenopathy or easy bruising Neurological:Denies numbness, tingling or new weaknesses Behavioral/Psych: Mood is stable, no new changes  All other systems were reviewed with the patient and are negative.  I have reviewed the  past medical history, past surgical history, social history and family history with the patient and they are unchanged from previous note.  ALLERGIES:  is allergic to ace  inhibitors and ciprofloxacin.  MEDICATIONS:  Current Outpatient Prescriptions  Medication Sig Dispense Refill  . acetaminophen (TYLENOL) 500 MG tablet Take 1,000 mg by mouth every 6 (six) hours as needed for fever.    . B Complex-C (SUPER B COMPLEX/VITAMIN C PO) Take 1 tablet by mouth daily.     . cholecalciferol (VITAMIN D) 1000 UNITS tablet Take 1,000 Units by mouth daily.    . dasatinib (SPRYCEL) 50 MG tablet Take 1 tablet (50 mg total) by mouth daily. 30 tablet 2  . diltiazem (CARDIZEM CD) 120 MG 24 hr capsule Take 1 capsule (120 mg total) by mouth daily. 30 capsule 3  . Docusate Calcium (STOOL SOFTENER PO) Take 2 tablets by mouth as needed. For constipation    . HYDROmorphone (DILAUDID) 4 MG tablet Take 4 mg by mouth every 4 (four) hours as needed for severe pain.    Marland Kitchen insulin glargine (LANTUS) 100 UNIT/ML injection Inject 18 Units into the skin at bedtime.     Marland Kitchen lactulose (CHRONULAC) 10 GM/15ML solution Take 10 g by mouth 2 (two) times daily as needed for moderate constipation.     . lidocaine-prilocaine (EMLA) cream Apply 1 application topically as needed. Before chemo    . losartan (COZAAR) 50 MG tablet Take 50 mg by mouth daily.  11  . magnesium oxide (MAG-OX) 400 MG tablet Take 1 tablet (400 mg total) by mouth daily. 30 tablet 3  . metFORMIN (GLUCOPHAGE) 1000 MG tablet Take 1,000 mg by mouth 2 (two) times daily with a meal.    . morphine (MSIR) 30 MG tablet Take 30 mg by mouth every 4 (four) hours as needed for severe pain. At bedtime for pain    . omeprazole (PRILOSEC) 20 MG capsule Take 20 mg by mouth as needed. For indigestion at night    . ondansetron (ZOFRAN) 8 MG tablet Take by mouth every 8 (eight) hours as needed for nausea or vomiting.    . pantoprazole (PROTONIX) 40 MG tablet Take 1 tablet (40 mg total) by mouth daily. 60 tablet 6  . promethazine (PHENERGAN) 25 MG tablet Take 25 mg by mouth every 6 (six) hours as needed for nausea or vomiting (nausea).    . Rivaroxaban  (XARELTO) 15 MG TABS tablet Take 1 tablet (15 mg total) by mouth daily with supper. 30 tablet 3  . sennosides-docusate sodium (SENOKOT-S) 8.6-50 MG tablet Take 2 tablets by mouth at bedtime.     . simvastatin (ZOCOR) 10 MG tablet     . simvastatin (ZOCOR) 20 MG tablet Take 20 mg by mouth daily.     . traMADol (ULTRAM) 50 MG tablet Take 1 tablet (50 mg total) by mouth every 6 (six) hours as needed. 60 tablet 0  . vitamin B-12 (CYANOCOBALAMIN) 1000 MCG tablet Take 1,000 mcg by mouth daily.     No current facility-administered medications for this visit.   Facility-Administered Medications Ordered in Other Visits  Medication Dose Route Frequency Provider Last Rate Last Dose  . gemcitabine (GEMZAR) 1,140 mg in sodium chloride 0.9 % 100 mL chemo infusion  500 mg/m2 (Treatment Plan Actual) Intravenous Once [name], MD      . heparin lock flush 100 unit/mL  500 Units Intracatheter Once PRN [name], MD      . ondansetron (ZOFRAN) 8 mg, dexamethasone (DECADRON)  10 mg in sodium chloride 0.9 % 50 mL IVPB   Intravenous Once [name], MD      . PACLitaxel-protein bound (ABRAXANE) chemo infusion 175 mg  80 mg/m2 (Treatment Plan Actual) Intravenous Once [name], MD      . sodium chloride 0.9 % injection 10 mL  10 mL Intracatheter PRN [name], MD        PHYSICAL EXAMINATION: ECOG PERFORMANCE STATUS: 1 - Symptomatic but completely ambulatory  Filed Vitals:   04/29/15 1036  BP: 139/64  Pulse: 80  Temp: 98.1 F (36.7 C)  Resp: 18   Filed Weights   04/29/15 1036  Weight: 228 lb 1.6 oz (103.465 kg)    GENERAL:alert, no distress and comfortable. He is obese SKIN: skin color is pale, texture, turgor are normal, no rashes or significant lesions EYES: normal, Conjunctiva are pale and non-injected, sclera clear OROPHARYNX:no exudate, no erythema and lips, buccal mucosa, and tongue normal  NECK: supple, thyroid normal size, non-tender, without nodularity LYMPH:  no palpable lymphadenopathy  in the cervical, axillary or inguinal LUNGS: clear to auscultation and percussion with normal breathing effort HEART: regular rate & rhythm and no murmurs bilateral lower extremity edema ABDOMEN:abdomen soft, non-tender and normal bowel sounds Musculoskeletal:no cyanosis of digits and no clubbing  NEURO: alert & oriented x 3 with fluent speech, no focal motor/sensory deficits  LABORATORY DATA:  I have reviewed the data as listed    Component Value Date/Time   NA 139 04/29/2015 1008   NA 137 04/04/2015 1347   K 3.9 04/29/2015 1008   K 4.2 04/04/2015 1347   CL 104 04/04/2015 1347   CL 107 04/27/2013 0926   CO2 21* 04/29/2015 1008   CO2 24 04/04/2015 1347   GLUCOSE 139 04/29/2015 1008   GLUCOSE 233* 04/04/2015 1347   GLUCOSE 212* 04/27/2013 0926   BUN 14.0 04/29/2015 1008   BUN 16 04/04/2015 1347   CREATININE 1.1 04/29/2015 1008   CREATININE 1.02 04/04/2015 1347   CALCIUM 9.5 04/29/2015 1008   CALCIUM 9.4 04/04/2015 1347   PROT 6.1* 04/29/2015 1008   PROT 6.1* 04/04/2015 1347   ALBUMIN 3.1* 04/29/2015 1008   ALBUMIN 3.0* 04/04/2015 1347   AST 26 04/29/2015 1008   AST 27 04/04/2015 1347   ALT 16 04/29/2015 1008   ALT 17 04/04/2015 1347   ALKPHOS 269* 04/29/2015 1008   ALKPHOS 268* 04/04/2015 1347   BILITOT 0.48 04/29/2015 1008   BILITOT 1.0 04/04/2015 1347   GFRNONAA >60 04/04/2015 1347   GFRAA >60 04/04/2015 1347    No results found for: SPEP, UPEP  Lab Results  Component Value Date   WBC 7.5 04/29/2015   NEUTROABS 5.9 04/29/2015   HGB 9.1* 04/29/2015   HCT 27.0* 04/29/2015   MCV 99.5* 04/29/2015   PLT 165 04/29/2015      Chemistry      Component Value Date/Time   NA 139 04/29/2015 1008   NA 137 04/04/2015 1347   K 3.9 04/29/2015 1008   K 4.2 04/04/2015 1347   CL 104 04/04/2015 1347   CL 107 04/27/2013 0926   CO2 21* 04/29/2015 1008   CO2 24 04/04/2015 1347   BUN 14.0 04/29/2015 1008   BUN 16 04/04/2015 1347   CREATININE 1.1 04/29/2015 1008    CREATININE 1.02 04/04/2015 1347      Component Value Date/Time   CALCIUM 9.5 04/29/2015 1008   CALCIUM 9.4 04/04/2015 1347   ALKPHOS 269* 04/29/2015 1008   ALKPHOS 268*  04/04/2015 1347   AST 26 04/29/2015 1008   AST 27 04/04/2015 1347   ALT 16 04/29/2015 1008   ALT 17 04/04/2015 1347   BILITOT 0.48 04/29/2015 1008   BILITOT 1.0 04/04/2015 1347      ASSESSMENT & PLAN:  Malignant neoplasm of head of pancreas After I reduced the dose of chemotherapy with Abraxane 20% along with reduced dose Gemzar by 50% plus G-CSF support with each cycle,  He tolerated that better. Recent repeat CT scan show reduction in the size of cancer I will recommend a few more cycles of treatment and repeat imaging in July 2016. I noted that the tumor markers fluctuated up and down. I discussed with the patient and his wife the limitation of tumor marker monitoring.  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 The patient denies recent history of bleeding such as epistaxis, hematuria or hematochezia. [name] I will observe for now.  He does not require transfusion now.     Bilateral leg edema  This is related to low albumin state. I recommend cutting down on salt ingestion and elastic compression hose.   Paroxysmal a-fib He has paroxsymal atrial fibrillation recently. EMT documented atrial fibrillation but EKG at the emergency department showed normal sinus rhythm. BNP was mildly elevated in the ER but he has resolved back to normal. He follows with cardiologist. Of note, recent thyroid function tests were within normal limits He will continue medical management and anticoagulation therapy as directed.   Chronic right shoulder pain I reviewed the last 2 CT scan of the chest with particular attention to the right shoulder area. Physical examination confirmed localized pain which is likely related to degenerative arthritis. 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 His pain appeared to be under control with tramadol as needed.  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 He will continue dose adjusted treatment with abraxane     Orders Placed This Encounter  Procedures  . CT Chest W Contrast    Standing Status: Future     Number of Occurrences:      Standing Expiration Date: 06/28/2016    Order Specific Question:  Reason for Exam (SYMPTOM  OR DIAGNOSIS REQUIRED)    Answer:  staging metastatic pancreatic ca    Order Specific Question:  Preferred imaging location?    Answer:  [facility]  . CT Abdomen Pelvis W Contrast    Standing Status: Future     Number of Occurrences:      Standing Expiration Date: 07/29/2016    Order Specific Question:  Reason for Exam (SYMPTOM  OR DIAGNOSIS REQUIRED)    Answer:  staging metastatic pancreatic ca    Order Specific Question:  Preferred imaging location?    Answer:  [facility]   All questions were answered. The patient knows to call the clinic with any problems, questions or concerns. No barriers to learning was detected. I spent 30 minutes counseling the patient face to face. The total time spent in the appointment was 40 minutes and more than 50% was on counseling and review of test results     [facility], Swanson Farnell, MD 04/29/2015 11:12 AM

## 2015-04-30 ENCOUNTER — Ambulatory Visit (HOSPITAL_BASED_OUTPATIENT_CLINIC_OR_DEPARTMENT_OTHER): Payer: Medicare Other

## 2015-04-30 ENCOUNTER — Telehealth: Payer: Self-pay | Admitting: Hematology and Oncology

## 2015-04-30 VITALS — BP 139/68 | HR 82 | Temp 98.1°F

## 2015-04-30 DIAGNOSIS — Z5189 Encounter for other specified aftercare: Secondary | ICD-10-CM | POA: Diagnosis not present

## 2015-04-30 DIAGNOSIS — C250 Malignant neoplasm of head of pancreas: Secondary | ICD-10-CM | POA: Diagnosis present

## 2015-04-30 LAB — CANCER ANTIGEN 19-9: CA 19-9: 1226.8 U/mL — ABNORMAL HIGH (ref <35.0)

## 2015-04-30 MED ORDER — PEGFILGRASTIM INJECTION 6 MG/0.6ML ~~LOC~~
6.0000 mg | PREFILLED_SYRINGE | Freq: Once | SUBCUTANEOUS | Status: AC
  Administered 2015-04-30: 6 mg via SUBCUTANEOUS
  Filled 2015-04-30: qty 0.6

## 2015-04-30 NOTE — Patient Instructions (Signed)
Pegfilgrastim injection What is this medicine? PEGFILGRASTIM (peg fil GRA stim) is a long-acting granulocyte colony-stimulating factor that stimulates the growth of neutrophils, a type of white blood cell important in the body's fight against infection. It is used to reduce the incidence of fever and infection in patients with certain types of cancer who are receiving chemotherapy that affects the bone marrow. This medicine may be used for other purposes; ask your health care provider or pharmacist if you have questions. COMMON BRAND NAME(S): Neulasta What should I tell my health care provider before I take this medicine? They need to know if you have any of these conditions: -latex allergy -ongoing radiation therapy -sickle cell disease -skin reactions to acrylic adhesives (On-Body Injector only) -an unusual or allergic reaction to pegfilgrastim, filgrastim, other medicines, foods, dyes, or preservatives -pregnant or trying to get pregnant -breast-feeding How should I use this medicine? This medicine is for injection under the skin. If you get this medicine at home, you will be taught how to prepare and give the pre-filled syringe or how to use the On-body Injector. Refer to the patient Instructions for Use for detailed instructions. Use exactly as directed. Take your medicine at regular intervals. Do not take your medicine more often than directed. It is important that you put your used needles and syringes in a special sharps container. Do not put them in a trash can. If you do not have a sharps container, call your pharmacist or healthcare provider to get one. Talk to your pediatrician regarding the use of this medicine in children. Special care may be needed. Overdosage: If you think you have taken too much of this medicine contact a poison control center or emergency room at once. NOTE: This medicine is only for you. Do not share this medicine with others. What if I miss a dose? It is  important not to miss your dose. Call your doctor or health care professional if you miss your dose. If you miss a dose due to an On-body Injector failure or leakage, a new dose should be administered as soon as possible using a single prefilled syringe for manual use. What may interact with this medicine? Interactions have not been studied. Give your health care provider a list of all the medicines, herbs, non-prescription drugs, or dietary supplements you use. Also tell them if you smoke, drink alcohol, or use illegal drugs. Some items may interact with your medicine. This list may not describe all possible interactions. Give your health care provider a list of all the medicines, herbs, non-prescription drugs, or dietary supplements you use. Also tell them if you smoke, drink alcohol, or use illegal drugs. Some items may interact with your medicine. What should I watch for while using this medicine? You may need blood work done while you are taking this medicine. If you are going to need a MRI, CT scan, or other procedure, tell your doctor that you are using this medicine (On-Body Injector only). What side effects may I notice from receiving this medicine? Side effects that you should report to your doctor or health care professional as soon as possible: -allergic reactions like skin rash, itching or hives, swelling of the face, lips, or tongue -dizziness -fever -pain, redness, or irritation at site where injected -pinpoint red spots on the skin -shortness of breath or breathing problems -stomach or side pain, or pain at the shoulder -swelling -tiredness -trouble passing urine Side effects that usually do not require medical attention (report to your doctor   or health care professional if they continue or are bothersome): -bone pain -muscle pain This list may not describe all possible side effects. Call your doctor for medical advice about side effects. You may report side effects to FDA at  1-800-FDA-1088. Where should I keep my medicine? Keep out of the reach of children. Store pre-filled syringes in a refrigerator between 2 and 8 degrees C (36 and 46 degrees F). Do not freeze. Keep in carton to protect from light. Throw away this medicine if it is left out of the refrigerator for more than 48 hours. Throw away any unused medicine after the expiration date. NOTE: This sheet is a summary. It may not cover all possible information. If you have questions about this medicine, talk to your doctor, pharmacist, or health care provider.  2015, [employer]. (2014-02-01 16:14:05)  

## 2015-04-30 NOTE — Telephone Encounter (Signed)
returned call and s.w. pt and confirm that he can get inj any time after 1pm today

## 2015-05-07 ENCOUNTER — Emergency Department (HOSPITAL_COMMUNITY): Payer: Medicare Other

## 2015-05-07 ENCOUNTER — Inpatient Hospital Stay (HOSPITAL_COMMUNITY): Payer: Medicare Other

## 2015-05-07 ENCOUNTER — Encounter (HOSPITAL_COMMUNITY): Payer: Self-pay | Admitting: Emergency Medicine

## 2015-05-07 ENCOUNTER — Inpatient Hospital Stay (HOSPITAL_COMMUNITY)
Admission: EM | Admit: 2015-05-07 | Discharge: 2015-05-10 | DRG: 871 | Disposition: A | Discharge status: Home / Self Care | Payer: Medicare Other | Attending: Internal Medicine | Admitting: Internal Medicine

## 2015-05-07 DIAGNOSIS — A419 Sepsis, unspecified organism: Principal | ICD-10-CM | POA: Diagnosis present

## 2015-05-07 DIAGNOSIS — C9210 Chronic myeloid leukemia, BCR/ABL-positive, not having achieved remission: Secondary | ICD-10-CM | POA: Diagnosis present

## 2015-05-07 DIAGNOSIS — R509 Fever, unspecified: Secondary | ICD-10-CM

## 2015-05-07 DIAGNOSIS — R6510 Systemic inflammatory response syndrome (SIRS) of non-infectious origin without acute organ dysfunction: Secondary | ICD-10-CM | POA: Insufficient documentation

## 2015-05-07 DIAGNOSIS — I480 Paroxysmal atrial fibrillation: Secondary | ICD-10-CM | POA: Diagnosis present

## 2015-05-07 DIAGNOSIS — D72829 Elevated white blood cell count, unspecified: Secondary | ICD-10-CM

## 2015-05-07 DIAGNOSIS — N183 Chronic kidney disease, stage 3 (moderate): Secondary | ICD-10-CM | POA: Diagnosis present

## 2015-05-07 DIAGNOSIS — K5900 Constipation, unspecified: Secondary | ICD-10-CM | POA: Diagnosis present

## 2015-05-07 DIAGNOSIS — C250 Malignant neoplasm of head of pancreas: Secondary | ICD-10-CM | POA: Diagnosis present

## 2015-05-07 DIAGNOSIS — G4733 Obstructive sleep apnea (adult) (pediatric): Secondary | ICD-10-CM | POA: Diagnosis present

## 2015-05-07 DIAGNOSIS — Z794 Long term (current) use of insulin: Secondary | ICD-10-CM | POA: Diagnosis not present

## 2015-05-07 DIAGNOSIS — Z9049 Acquired absence of other specified parts of digestive tract: Secondary | ICD-10-CM | POA: Diagnosis present

## 2015-05-07 DIAGNOSIS — E780 Pure hypercholesterolemia: Secondary | ICD-10-CM | POA: Diagnosis present

## 2015-05-07 DIAGNOSIS — I10 Essential (primary) hypertension: Secondary | ICD-10-CM | POA: Diagnosis present

## 2015-05-07 DIAGNOSIS — K219 Gastro-esophageal reflux disease without esophagitis: Secondary | ICD-10-CM | POA: Diagnosis present

## 2015-05-07 DIAGNOSIS — K8689 Other specified diseases of pancreas: Secondary | ICD-10-CM

## 2015-05-07 DIAGNOSIS — E1140 Type 2 diabetes mellitus with diabetic neuropathy, unspecified: Secondary | ICD-10-CM | POA: Diagnosis present

## 2015-05-07 DIAGNOSIS — K861 Other chronic pancreatitis: Secondary | ICD-10-CM | POA: Diagnosis present

## 2015-05-07 DIAGNOSIS — J189 Pneumonia, unspecified organism: Secondary | ICD-10-CM | POA: Diagnosis present

## 2015-05-07 DIAGNOSIS — F419 Anxiety disorder, unspecified: Secondary | ICD-10-CM | POA: Diagnosis present

## 2015-05-07 DIAGNOSIS — Z9221 Personal history of antineoplastic chemotherapy: Secondary | ICD-10-CM | POA: Diagnosis not present

## 2015-05-07 DIAGNOSIS — E876 Hypokalemia: Secondary | ICD-10-CM | POA: Diagnosis present

## 2015-05-07 DIAGNOSIS — E1122 Type 2 diabetes mellitus with diabetic chronic kidney disease: Secondary | ICD-10-CM | POA: Diagnosis present

## 2015-05-07 DIAGNOSIS — Z87891 Personal history of nicotine dependence: Secondary | ICD-10-CM | POA: Diagnosis not present

## 2015-05-07 DIAGNOSIS — Z9841 Cataract extraction status, right eye: Secondary | ICD-10-CM | POA: Diagnosis not present

## 2015-05-07 DIAGNOSIS — Z881 Allergy status to other antibiotic agents status: Secondary | ICD-10-CM | POA: Diagnosis not present

## 2015-05-07 DIAGNOSIS — Z9842 Cataract extraction status, left eye: Secondary | ICD-10-CM | POA: Diagnosis not present

## 2015-05-07 DIAGNOSIS — R600 Localized edema: Secondary | ICD-10-CM | POA: Diagnosis not present

## 2015-05-07 DIAGNOSIS — I129 Hypertensive chronic kidney disease with stage 1 through stage 4 chronic kidney disease, or unspecified chronic kidney disease: Secondary | ICD-10-CM | POA: Diagnosis present

## 2015-05-07 DIAGNOSIS — E785 Hyperlipidemia, unspecified: Secondary | ICD-10-CM | POA: Diagnosis present

## 2015-05-07 DIAGNOSIS — D630 Anemia in neoplastic disease: Secondary | ICD-10-CM | POA: Diagnosis present

## 2015-05-07 LAB — CBC WITH DIFFERENTIAL/PLATELET
BASOS PCT: 0 % (ref 0–1)
Basophils Absolute: 0 10*3/uL (ref 0.0–0.1)
Eosinophils Absolute: 0 10*3/uL (ref 0.0–0.7)
Eosinophils Relative: 0 % (ref 0–5)
HCT: 29.8 % — ABNORMAL LOW (ref 39.0–52.0)
HEMOGLOBIN: 9.7 g/dL — AB (ref 13.0–17.0)
LYMPHS PCT: 6 % — AB (ref 12–46)
Lymphs Abs: 1.6 10*3/uL (ref 0.7–4.0)
MCH: 33 pg (ref 26.0–34.0)
MCHC: 32.6 g/dL (ref 30.0–36.0)
MCV: 101.4 fL — AB (ref 78.0–100.0)
MONO ABS: 2.1 10*3/uL — AB (ref 0.1–1.0)
Monocytes Relative: 8 % (ref 3–12)
Neutro Abs: 22.5 10*3/uL — ABNORMAL HIGH (ref 1.7–7.7)
Neutrophils Relative %: 86 % — ABNORMAL HIGH (ref 43–77)
Platelets: 117 10*3/uL — ABNORMAL LOW (ref 150–400)
RBC: 2.94 MIL/uL — ABNORMAL LOW (ref 4.22–5.81)
RDW: 18.1 % — ABNORMAL HIGH (ref 11.5–15.5)
WBC: 26.2 10*3/uL — AB (ref 4.0–10.5)

## 2015-05-07 LAB — COMPREHENSIVE METABOLIC PANEL
ALBUMIN: 3.7 g/dL (ref 3.5–5.0)
ALT: 20 U/L (ref 17–63)
ANION GAP: 10 (ref 5–15)
AST: 35 U/L (ref 15–41)
Alkaline Phosphatase: 343 U/L — ABNORMAL HIGH (ref 38–126)
BUN: 11 mg/dL (ref 6–20)
CO2: 24 mmol/L (ref 22–32)
Calcium: 9.5 mg/dL (ref 8.9–10.3)
Chloride: 106 mmol/L (ref 101–111)
Creatinine, Ser: 1.17 mg/dL (ref 0.61–1.24)
GFR calc Af Amer: >60 mL/min (ref >60)
GFR calc non Af Amer: >60 mL/min (ref >60)
Glucose, Bld: 219 mg/dL — ABNORMAL HIGH (ref 65–99)
POTASSIUM: 3.7 mmol/L (ref 3.5–5.1)
SODIUM: 140 mmol/L (ref 135–145)
TOTAL PROTEIN: 6.7 g/dL (ref 6.5–8.1)
Total Bilirubin: 0.7 mg/dL (ref 0.3–1.2)

## 2015-05-07 LAB — LIPASE, BLOOD

## 2015-05-07 LAB — I-STAT CG4 LACTIC ACID, ED: Lactic Acid, Venous: 2.43 mmol/L (ref 0.5–2.0)

## 2015-05-07 MED ORDER — MAGNESIUM OXIDE 400 (241.3 MG) MG PO TABS
400.0000 mg | ORAL_TABLET | Freq: Every day | ORAL | Status: DC
  Administered 2015-05-08 – 2015-05-10 (×3): 400 mg via ORAL
  Filled 2015-05-07 (×6): qty 1

## 2015-05-07 MED ORDER — SIMVASTATIN 20 MG PO TABS: 20.0000 mg | ORAL_TABLET | Freq: Every day | ORAL | Status: DC

## 2015-05-07 MED ORDER — PIPERACILLIN-TAZOBACTAM 3.375 G IVPB
3.3750 g | Freq: Three times a day (TID) | INTRAVENOUS | Status: DC
  Administered 2015-05-08 – 2015-05-10 (×7): 3.375 g via INTRAVENOUS
  Filled 2015-05-07 (×8): qty 50

## 2015-05-07 MED ORDER — VANCOMYCIN HCL IN DEXTROSE 1-5 GM/200ML-% IV SOLN
1000.0000 mg | Freq: Once | INTRAVENOUS | Status: AC
  Administered 2015-05-08: 1000 mg via INTRAVENOUS
  Filled 2015-05-07: qty 200

## 2015-05-07 MED ORDER — SENNOSIDES-DOCUSATE SODIUM 8.6-50 MG PO TABS
2.0000 | ORAL_TABLET | Freq: Every day | ORAL | Status: DC
Start: 2015-05-07 — End: 2015-05-10
  Administered 2015-05-07 – 2015-05-09 (×3): 2 via ORAL
  Filled 2015-05-07 (×6): qty 2

## 2015-05-07 MED ORDER — OMEPRAZOLE 20 MG PO CPDR
20.0000 mg | DELAYED_RELEASE_CAPSULE | Freq: Every day | ORAL | Status: DC
  Administered 2015-05-08 – 2015-05-10 (×3): 20 mg via ORAL
  Filled 2015-05-07 (×3): qty 1

## 2015-05-07 MED ORDER — ACETAMINOPHEN 500 MG PO TABS: 1000.0000 mg | ORAL_TABLET | Freq: Four times a day (QID) | ORAL | Status: DC | PRN

## 2015-05-07 MED ORDER — HYDROMORPHONE HCL 4 MG PO TABS: 4.0000 mg | ORAL_TABLET | ORAL | Status: DC | PRN

## 2015-05-07 MED ORDER — SODIUM CHLORIDE 0.9 % IJ SOLN
3.0000 mL | Freq: Two times a day (BID) | INTRAMUSCULAR | Status: DC
  Administered 2015-05-08: 3 mL via INTRAVENOUS

## 2015-05-07 MED ORDER — VANCOMYCIN HCL IN DEXTROSE 750-5 MG/150ML-% IV SOLN
750.0000 mg | Freq: Two times a day (BID) | INTRAVENOUS | Status: DC
  Administered 2015-05-08 – 2015-05-09 (×4): 750 mg via INTRAVENOUS
  Filled 2015-05-07 (×5): qty 150

## 2015-05-07 MED ORDER — VANCOMYCIN HCL IN DEXTROSE 1-5 GM/200ML-% IV SOLN
1000.0000 mg | Freq: Once | INTRAVENOUS | Status: AC
  Administered 2015-05-07: 1000 mg via INTRAVENOUS
  Filled 2015-05-07: qty 200

## 2015-05-07 MED ORDER — LACTULOSE 10 GM/15ML PO SOLN: 10.0000 g | Freq: Two times a day (BID) | ORAL | Status: DC | PRN

## 2015-05-07 MED ORDER — PIPERACILLIN-TAZOBACTAM 3.375 G IVPB 30 MIN: 3.3750 g | Freq: Once | INTRAVENOUS | Status: DC

## 2015-05-07 MED ORDER — HEPARIN SODIUM (PORCINE) 5000 UNIT/ML IJ SOLN: 5000.0000 [IU] | Freq: Three times a day (TID) | INTRAMUSCULAR | Status: DC

## 2015-05-07 MED ORDER — MORPHINE SULFATE 15 MG PO TABS: 30.0000 mg | ORAL_TABLET | ORAL | Status: DC | PRN

## 2015-05-07 MED ORDER — SODIUM CHLORIDE 0.9 % IV BOLUS (SEPSIS)
1000.0000 mL | INTRAVENOUS | Status: AC
  Administered 2015-05-07 (×3): 1000 mL via INTRAVENOUS

## 2015-05-07 MED ORDER — INSULIN GLARGINE 100 UNIT/ML ~~LOC~~ SOLN
18.0000 [IU] | Freq: Every day | SUBCUTANEOUS | Status: DC
  Administered 2015-05-08 – 2015-05-09 (×3): 18 [IU] via SUBCUTANEOUS
  Filled 2015-05-07 (×3): qty 0.18

## 2015-05-07 MED ORDER — METFORMIN HCL 500 MG PO TABS
1000.0000 mg | ORAL_TABLET | Freq: Two times a day (BID) | ORAL | Status: DC
  Administered 2015-05-08 – 2015-05-10 (×5): 1000 mg via ORAL
  Filled 2015-05-07 (×5): qty 2

## 2015-05-07 MED ORDER — RIVAROXABAN 15 MG PO TABS
15.0000 mg | ORAL_TABLET | Freq: Every day | ORAL | Status: DC
  Administered 2015-05-07 – 2015-05-09 (×3): 15 mg via ORAL
  Filled 2015-05-07 (×3): qty 1

## 2015-05-07 MED ORDER — DOCUSATE SODIUM 100 MG PO CAPS
100.0000 mg | ORAL_CAPSULE | Freq: Two times a day (BID) | ORAL | Status: DC
  Administered 2015-05-07 – 2015-05-10 (×5): 100 mg via ORAL
  Filled 2015-05-07 (×5): qty 1

## 2015-05-07 MED ORDER — PIPERACILLIN-TAZOBACTAM 3.375 G IVPB 30 MIN
3.3750 g | Freq: Once | INTRAVENOUS | Status: AC
  Administered 2015-05-07: 3.375 g via INTRAVENOUS
  Filled 2015-05-07: qty 50

## 2015-05-07 MED ORDER — SODIUM CHLORIDE 0.9 % IV BOLUS (SEPSIS)
500.0000 mL | INTRAVENOUS | Status: AC
  Administered 2015-05-08: 500 mL via INTRAVENOUS

## 2015-05-07 MED ORDER — PANTOPRAZOLE SODIUM 40 MG PO TBEC: 40.0000 mg | DELAYED_RELEASE_TABLET | Freq: Every day | ORAL | Status: DC

## 2015-05-07 MED ORDER — TRAMADOL HCL 50 MG PO TABS: 50.0000 mg | ORAL_TABLET | Freq: Four times a day (QID) | ORAL | Status: DC | PRN

## 2015-05-07 MED ORDER — DILTIAZEM HCL ER COATED BEADS 120 MG PO CP24
120.0000 mg | ORAL_CAPSULE | Freq: Every day | ORAL | Status: DC
  Administered 2015-05-08 – 2015-05-10 (×3): 120 mg via ORAL
  Filled 2015-05-07 (×4): qty 1

## 2015-05-07 MED ORDER — ONDANSETRON HCL 8 MG PO TABS: 8.0000 mg | ORAL_TABLET | Freq: Three times a day (TID) | ORAL | Status: DC | PRN

## 2015-05-07 MED ORDER — DASATINIB 50 MG PO TABS
50.0000 mg | ORAL_TABLET | Freq: Every day | ORAL | Status: DC
  Administered 2015-05-08 – 2015-05-09 (×2): 50 mg via ORAL
  Filled 2015-05-07 (×2): qty 1

## 2015-05-07 MED ORDER — LOSARTAN POTASSIUM 50 MG PO TABS
50.0000 mg | ORAL_TABLET | Freq: Every day | ORAL | Status: DC
  Administered 2015-05-08 – 2015-05-10 (×3): 50 mg via ORAL
  Filled 2015-05-07 (×3): qty 1

## 2015-05-07 MED ORDER — ATORVASTATIN CALCIUM 10 MG PO TABS
10.0000 mg | ORAL_TABLET | Freq: Every day | ORAL | Status: DC
  Administered 2015-05-08 – 2015-05-09 (×2): 10 mg via ORAL
  Filled 2015-05-07 (×2): qty 1

## 2015-05-07 NOTE — ED Notes (Signed)
MD in room talking with patient unable to collect labs at this time.

## 2015-05-07 NOTE — ED Notes (Addendum)
Pt states that he started having a fever today. Pancreatic CA pt. Last chemo 8 days ago. Diarrhea x 3 days. Alert and oriented. Wife gave 650 tylenol at approx. 8pm.

## 2015-05-07 NOTE — ED Provider Notes (Signed)
CSN: [id number]     Arrival date & time 05/07/15  2019 History   First MD Initiated Contact with Patient 05/07/15 2034     Chief Complaint  Patient presents with  . Chemo Card   . Fever     (Consider location/radiation/quality/duration/timing/severity/associated sxs/prior Treatment) The history is provided by the patient.  Evan [name] is a 74 y.o. male history diabetes, high cholesterol, hypertension, stage IV pancreatic cancer presenting with fever. He had chemotherapy 8 days ago. Has been having some chills for the last 2 days. Some headache yesterday as well. Has nonproductive cough as well. Patient started having fever today at home. Took Tylenol prior to arrival. Has chronic abdominal pain that is not getting worse, does have some diarrhea the last 3 days. His any recent antibiotic use.    Past Medical History  Diagnosis Date  . Chronic back pain greater than 3 months duration   . Bulging discs   . Diabetes mellitus     type II; neuropathy;   . Hypertension   . High cholesterol   . Leukocytosis   . GERD (gastroesophageal reflux disease)   . Pancreatitis 2004  . Anxiety   . DDD (degenerative disc disease) 2005    lumbar spine  . History of smoking 08/01/2012  . Weight loss 08/01/2012  . Cough 08/01/2012  . Left leg pain 08/05/2012  . Chronic pancreatitis   . OSA (obstructive sleep apnea) 2010    does not use CPAP  . Nausea alone 06/13/2014  . Allergy   . Mucositis (ulcerative) due to antineoplastic therapy 07/02/2014  . Constipated 07/16/2014  . Gastric ulcer 08/23/2014  . Hx of radiation therapy 06/25/14-08/06/14    pancreas head, 50.4Gy  . 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 Pancreatic cancer 06/08/2014  . Chest pain 11/05/2014  . Tachycardia 03/04/2015   Past Surgical History  Procedure Laterality Date  . Cholecystectomy      laparoscopic  . Hernia repair      umbilical  . Skin cancer removed  2012    right ear, basal  cell carcinoma.   Marland Kitchen Spinal stretching    . Colonoscopy  2012    [facility]  . Eus Left 06/07/2014    Procedure: UPPER ENDOSCOPIC ULTRASOUND (EUS) LINEAR;  Surgeon: Arta Silence, MD;  Location: [facility] ENDOSCOPY;  Service: Endoscopy;  Laterality: Left;  . Ercp N/A 06/07/2014    Procedure: ENDOSCOPIC RETROGRADE CHOLANGIOPANCREATOGRAPHY (ERCP);  Surgeon: Arta Silence, MD;  Location: [facility];  Service: Endoscopy;  Laterality: N/A;  . Eye surgery      bilateral cataracts with lens implant  . Cataract extraction Bilateral   . Port acath placement Left 06/14/14  . Portacath placement Left 06/14/2014    Procedure: INSERTION PORT-A-CATH;  Surgeon: Stark Klein, MD;  Location: [facility] ORS;  Service: General;  Laterality: Left;  . Esophagogastroduodenoscopy N/A 08/15/2014    Procedure: ESOPHAGOGASTRODUODENOSCOPY (EGD);  Surgeon: Lear Ng, MD;  Location: [facility];  Service: Endoscopy;  Laterality: N/A;   Family History  Problem Relation Age of Onset  . Heart attack Mother   . Cancer Mother 13    lung  . Heart attack Father   . Stroke Father    History  Substance Use Topics  . Smoking status: Former Smoker -- 1.50 packs/day for 40 years    Quit date: 01/24/2004  . Smokeless tobacco: Never Used  . Alcohol Use: No    Review of Systems  Constitutional:  Positive for fever.  Respiratory: Positive for cough.   All other systems reviewed and are negative.     Allergies  Ace inhibitors and Ciprofloxacin  Home Medications   Prior to Admission medications   Medication Sig Start Date End Date Taking? Authorizing Provider  acetaminophen (TYLENOL) 500 MG tablet Take 1,000 mg by mouth every 6 (six) hours as needed for fever.   Yes Historical Provider, MD  B Complex-C (SUPER B COMPLEX/VITAMIN C PO) Take 1 tablet by mouth daily.    Yes Historical Provider, MD  cholecalciferol (VITAMIN D) 1000 UNITS tablet Take 1,000 Units by mouth daily.   Yes Historical Provider, MD  dasatinib (SPRYCEL) 50  MG tablet Take 1 tablet (50 mg total) by mouth daily. 04/24/15  Yes [name], MD  diltiazem (CARDIZEM CD) 120 MG 24 hr capsule Take 1 capsule (120 mg total) by mouth daily. 04/22/15  Yes Josue Hector, MD  Docusate Calcium (STOOL SOFTENER PO) Take 2 tablets by mouth as needed. For constipation   Yes Historical Provider, MD  HYDROmorphone (DILAUDID) 4 MG tablet Take 4 mg by mouth every 4 (four) hours as needed for severe pain.   Yes Historical Provider, MD  insulin glargine (LANTUS) 100 UNIT/ML injection Inject 18 Units into the skin at bedtime.  08/09/14  Yes Modena Jansky, MD  lactulose (CHRONULAC) 10 GM/15ML solution Take 10 g by mouth 2 (two) times daily as needed for moderate constipation.  06/21/14  Yes Historical Provider, MD  lidocaine-prilocaine (EMLA) cream Apply 1 application topically as needed. Before chemo   Yes Historical Provider, MD  losartan (COZAAR) 50 MG tablet Take 50 mg by mouth daily. 12/06/14  Yes Historical Provider, MD  magnesium oxide (MAG-OX) 400 MG tablet Take 1 tablet (400 mg total) by mouth daily. 03/06/15  Yes [name], MD  metFORMIN (GLUCOPHAGE) 1000 MG tablet Take 1,000 mg by mouth 2 (two) times daily with a meal.   Yes Historical Provider, MD  morphine (MSIR) 30 MG tablet Take 30 mg by mouth every 4 (four) hours as needed for severe pain. At bedtime for pain   Yes Historical Provider, MD  omeprazole (PRILOSEC) 20 MG capsule Take 20 mg by mouth as needed. For indigestion at night   Yes Historical Provider, MD  ondansetron (ZOFRAN) 8 MG tablet Take by mouth every 8 (eight) hours as needed for nausea or vomiting.   Yes Historical Provider, MD  pantoprazole (PROTONIX) 40 MG tablet Take 1 tablet (40 mg total) by mouth daily. 08/23/14  Yes [name], MD  promethazine (PHENERGAN) 25 MG tablet Take 25 mg by mouth every 6 (six) hours as needed for nausea or vomiting (nausea).   Yes Historical Provider, MD  Rivaroxaban (XARELTO) 15 MG TABS tablet Take 1 tablet (15 mg total) by  mouth daily with supper. 04/22/15  Yes Josue Hector, MD  sennosides-docusate sodium (SENOKOT-S) 8.6-50 MG tablet Take 2 tablets by mouth at bedtime.    Yes Historical Provider, MD  simvastatin (ZOCOR) 20 MG tablet Take 20 mg by mouth daily.    Yes Historical Provider, MD  traMADol (ULTRAM) 50 MG tablet Take 1 tablet (50 mg total) by mouth every 6 (six) hours as needed. Patient taking differently: Take 50 mg by mouth every 6 (six) hours as needed for moderate pain.  04/29/15  Yes [name], MD  vitamin B-12 (CYANOCOBALAMIN) 1000 MCG tablet Take 1,000 mcg by mouth daily.   Yes Historical Provider, MD   BP 172/87 mmHg  Pulse 98  Temp(Src) 102.1 F (38.9 C) (Oral)  SpO2 92% Physical Exam  Constitutional: He is oriented to person, place, and time.  Chronically ill, dehydrated   HENT:  Head: Normocephalic.  MM dry   Eyes: Conjunctivae are normal. Pupils are equal, round, and reactive to light.  Neck: Normal range of motion. Neck supple.  Cardiovascular: Regular rhythm and normal heart sounds.   Tachycardic   Pulmonary/Chest: Effort normal and breath sounds normal. No respiratory distress. He has no wheezes. He has no rales.  Abdominal: Soft. Bowel sounds are normal. He exhibits no distension. There is no tenderness. There is no rebound and no guarding.  Musculoskeletal: Normal range of motion. He exhibits no tenderness.  1+ edema bilateral legs   Neurological: He is alert and oriented to person, place, and time. No cranial nerve deficit. Coordination normal.  Skin: Skin is warm and dry.  Psychiatric: He has a normal mood and affect. His behavior is normal. Judgment and thought content normal.  Nursing note and vitals reviewed.   ED Course  Procedures (including critical care time) Labs Review Labs Reviewed  CBC WITH DIFFERENTIAL/PLATELET - Abnormal; Notable for the following:    WBC 26.2 (*)    RBC 2.94 (*)    Hemoglobin 9.7 (*)    HCT 29.8 (*)    MCV 101.4 (*)    RDW 18.1 (*)     Platelets 117 (*)    Neutrophils Relative % 86 (*)    Lymphocytes Relative 6 (*)    Neutro Abs 22.5 (*)    Monocytes Absolute 2.1 (*)    All other components within normal limits  COMPREHENSIVE METABOLIC PANEL - Abnormal; Notable for the following:    Glucose, Bld 219 (*)    Alkaline Phosphatase 343 (*)    All other components within normal limits  I-STAT CG4 LACTIC ACID, ED - Abnormal; Notable for the following:    Lactic Acid, Venous 2.43 (*)    All other components within normal limits  CULTURE, BLOOD (ROUTINE X 2)  CULTURE, BLOOD (ROUTINE X 2)  URINE CULTURE  URINALYSIS, ROUTINE W REFLEX MICROSCOPIC (NOT AT [facility])  LIPASE, BLOOD    Imaging Review Dg Chest 2 View  05/07/2015   CLINICAL DATA:  Fever.  Patient denies cardiopulmonary symptoms.  EXAM: CHEST  2 VIEW  COMPARISON:  CT 04/04/2015.  Radiographs 04/04/2015.  FINDINGS: The cardiopericardial silhouette is enlarged. LEFT subclavian power port is unchanged. Bilateral subpulmonic effusions are present, with lateral peaking of the hemidiaphragms and blunting of both costophrenic angles. Mild thickening of the fissures is present on the lateral projection, suggesting interstitial pulmonary edema.  IMPRESSION: Chronic mild CHF. No interval change or superimposed acute cardiopulmonary disease.   Electronically Signed   By: Dereck Ligas M.D.   On: 05/07/2015 21:28     EKG Interpretation None      MDM   Final diagnoses:  Fever    MARTINO TOMPSON is a 74 y.o. male here with fever, cough, on chemo. Febrile, tachy. Code sepsis initiated.   10:06 PM WBC 26. CXR showed no obvious pneumonia. Recently admitted in march for HCAP. Given vanc/zosyn empirically. Will admit.       Wandra Arthurs, MD 05/07/15 2206

## 2015-05-07 NOTE — Progress Notes (Signed)
ANTIBIOTIC CONSULT NOTE - INITIAL  Pharmacy Consult for Zosyn/Vancomycin Indication: Sepsis  Allergies  Allergen Reactions  . Ace Inhibitors Other (See Comments)    Unknown  . Ciprofloxacin Rash    Patient Measurements:   Wt=103 kg  Vital Signs: Temp: 99.3 F (37.4 C) (06/21 2245) Temp Source: Oral (06/21 2245) BP: 145/63 mmHg (06/21 2245) Pulse Rate: 99 (06/21 2245) Intake/Output from previous day:   Intake/Output from this shift:    Labs:  Recent Labs  05/07/15 2053  WBC 26.2*  HGB 9.7*  PLT 117*  CREATININE 1.17   Estimated Creatinine Clearance: 73.2 mL/min (by C-G formula based on Cr of 1.17). No results for input(s): VANCOTROUGH, VANCOPEAK, VANCORANDOM, GENTTROUGH, GENTPEAK, GENTRANDOM, TOBRATROUGH, TOBRAPEAK, TOBRARND, AMIKACINPEAK, AMIKACINTROU, AMIKACIN in the last 72 hours.   Microbiology: No results found for this or any previous visit (from the past 720 hour(s)).  Medical History: Past Medical History  Diagnosis Date  . Chronic back pain greater than 3 months duration   . Bulging discs   . Diabetes mellitus     type II; neuropathy;   . Hypertension   . High cholesterol   . Leukocytosis   . GERD (gastroesophageal reflux disease)   . Pancreatitis 2004  . Anxiety   . DDD (degenerative disc disease) 2005    lumbar spine  . History of smoking 08/01/2012  . Weight loss 08/01/2012  . Cough 08/01/2012  . Left leg pain 08/05/2012  . Chronic pancreatitis   . OSA (obstructive sleep apnea) 2010    does not use CPAP  . Nausea alone 06/13/2014  . Allergy   . Mucositis (ulcerative) due to antineoplastic therapy 07/02/2014  . Constipated 07/16/2014  . Gastric ulcer 08/23/2014  . Hx of radiation therapy 06/25/14-08/06/14    pancreas head, 50.4Gy  . 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 Pancreatic cancer 06/08/2014  . Chest pain 11/05/2014  . Tachycardia 03/04/2015    Medications:  Prescriptions prior to admission   Medication Sig Dispense Refill Last Dose  . acetaminophen (TYLENOL) 500 MG tablet Take 1,000 mg by mouth every 6 (six) hours as needed for fever.   05/07/2015 at Unknown time  . B Complex-C (SUPER B COMPLEX/VITAMIN C PO) Take 1 tablet by mouth daily.    05/07/2015 at Unknown time  . cholecalciferol (VITAMIN D) 1000 UNITS tablet Take 1,000 Units by mouth daily.   05/07/2015 at Unknown time  . dasatinib (SPRYCEL) 50 MG tablet Take 1 tablet (50 mg total) by mouth daily. 30 tablet 2 05/06/2015 at 2000  . diltiazem (CARDIZEM CD) 120 MG 24 hr capsule Take 1 capsule (120 mg total) by mouth daily. 30 capsule 3 05/07/2015 at Unknown time  . Docusate Calcium (STOOL SOFTENER PO) Take 2 tablets by mouth as needed. For constipation   Past Week at Unknown time  . HYDROmorphone (DILAUDID) 4 MG tablet Take 4 mg by mouth every 4 (four) hours as needed for severe pain.   Past Week at Unknown time  . insulin glargine (LANTUS) 100 UNIT/ML injection Inject 18 Units into the skin at bedtime.    05/06/2015 at 2000  . lactulose (CHRONULAC) 10 GM/15ML solution Take 10 g by mouth 2 (two) times daily as needed for moderate constipation.    Past Week at Unknown time  . lidocaine-prilocaine (EMLA) cream Apply 1 application topically as needed. Before chemo   Past Week at Unknown time  . losartan (COZAAR) 50 MG tablet Take 50 mg  by mouth daily.  11 05/07/2015 at Unknown time  . magnesium oxide (MAG-OX) 400 MG tablet Take 1 tablet (400 mg total) by mouth daily. 30 tablet 3 05/07/2015 at Unknown time  . metFORMIN (GLUCOPHAGE) 1000 MG tablet Take 1,000 mg by mouth 2 (two) times daily with a meal.   05/07/2015 at 2000  . morphine (MSIR) 30 MG tablet Take 30 mg by mouth every 4 (four) hours as needed for severe pain. At bedtime for pain   Past Week at Unknown time  . omeprazole (PRILOSEC) 20 MG capsule Take 20 mg by mouth as needed. For indigestion at night   05/06/2015 at Unknown time  . ondansetron (ZOFRAN) 8 MG tablet Take by mouth every 8  (eight) hours as needed for nausea or vomiting.   Past Week at Unknown time  . pantoprazole (PROTONIX) 40 MG tablet Take 1 tablet (40 mg total) by mouth daily. 60 tablet 6 05/07/2015 at Unknown time  . promethazine (PHENERGAN) 25 MG tablet Take 25 mg by mouth every 6 (six) hours as needed for nausea or vomiting (nausea).   Past Week at Unknown time  . Rivaroxaban (XARELTO) 15 MG TABS tablet Take 1 tablet (15 mg total) by mouth daily with supper. 30 tablet 3 05/06/2015 at 2000  . sennosides-docusate sodium (SENOKOT-S) 8.6-50 MG tablet Take 2 tablets by mouth at bedtime.    05/06/2015 at Unknown time  . simvastatin (ZOCOR) 20 MG tablet Take 20 mg by mouth daily.    05/07/2015 at Unknown time  . traMADol (ULTRAM) 50 MG tablet Take 1 tablet (50 mg total) by mouth every 6 (six) hours as needed. (Patient taking differently: Take 50 mg by mouth every 6 (six) hours as needed for moderate pain. ) 60 tablet 0 Past Week at Unknown time  . vitamin B-12 (CYANOCOBALAMIN) 1000 MCG tablet Take 1,000 mcg by mouth daily.   05/07/2015 at Unknown time   Scheduled:  . [START ON 05/08/2015] atorvastatin  10 mg Oral q1800  . [START ON 05/08/2015] dasatinib  50 mg Oral Daily  . [START ON 05/08/2015] diltiazem  120 mg Oral Daily  . docusate sodium  100 mg Oral BID  . insulin glargine  18 Units Subcutaneous QHS  . [START ON 05/08/2015] losartan  50 mg Oral Daily  . [START ON 05/08/2015] magnesium oxide  400 mg Oral Daily  . [START ON 05/08/2015] metFORMIN  1,000 mg Oral BID WC  . [START ON 05/08/2015] omeprazole  20 mg Oral Daily  . [START ON 05/08/2015] piperacillin-tazobactam (ZOSYN)  IV  3.375 g Intravenous Q8H  . Rivaroxaban  15 mg Oral Q supper  . senna-docusate  2 tablet Oral QHS  . sodium chloride  1,000 mL Intravenous Q1H   Followed by  . sodium chloride  500 mL Intravenous Q1H  . sodium chloride  3 mL Intravenous Q12H  . vancomycin  1,000 mg Intravenous Once  . [START ON 05/08/2015] vancomycin  750 mg Intravenous Q12H    Infusions:   Assessment: 43 yoM with hx pancreatic Ca presenting with fever.  Zosyn/Vancomycin per Rx for Sepsis.   Goal of Therapy:  Vancomycin trough level 15-20 mcg/ml  Plan:   Vancomycin 2 Gm load (Give additional 1Gm now, 1Gm given in ED 2230) then 750mg  IV q12h  Zosyn 3.375 gm IV q8h EI  F/u SCr/cultures/levels  Dorrene German 05/07/2015,11:40 PM

## 2015-05-07 NOTE — H&P (Addendum)
[facility] History and Physical  Evan [name] IHK:[id number] DOB: 1941-01-06 DOA: 05/07/2015  Referring physician: Shirlyn Goltz, MD PCP: [name], MD   Chief Complaint: fever  HPI: Evan [name] is a 74 y.o. male with history of pancreatic cancer on chometherapy hypertension hyperlipidemia diabetes mellitus presents with fever. Patient has been having a fever of 101.53F at home. Patient states that he took some tylenol at home. He had been having some sinus type headache on Monday. Patient has had a poor appetite. He also has had diarrhea. Patient has noted some cough no blood in sputum. He did have a HCAP recently. He states that he has no abdominal pain noted. He denies having any chest pain. In the ED he had a CXR done which shows some chronic Mild CHF but no infiltrates. Also his WBC is elevated to 26.2 He denies having any dysuria but has not been able to provide any urine for a UA yet. He has not been drinking or eating well according to his wife.   Review of Systems:  12 point ROS performed and is unremarkable other than HPI  Past Medical History  Diagnosis Date  . Chronic back pain greater than 3 months duration   . Bulging discs   . Diabetes mellitus     type II; neuropathy;   . Hypertension   . High cholesterol   . Leukocytosis   . GERD (gastroesophageal reflux disease)   . Pancreatitis 2004  . Anxiety   . DDD (degenerative disc disease) 2005    lumbar spine  . History of smoking 08/01/2012  . Weight loss 08/01/2012  . Cough 08/01/2012  . Left leg pain 08/05/2012  . Chronic pancreatitis   . OSA (obstructive sleep apnea) 2010    does not use CPAP  . Nausea alone 06/13/2014  . Allergy   . Mucositis (ulcerative) due to antineoplastic therapy 07/02/2014  . Constipated 07/16/2014  . Gastric ulcer 08/23/2014  . Hx of radiation therapy 06/25/14-08/06/14    pancreas head, 50.4Gy  . 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 CML in remission 01/15/2014    . Pancreatic cancer 06/08/2014  . Chest pain 11/05/2014  . Tachycardia 03/04/2015   Past Surgical History  Procedure Laterality Date  . Cholecystectomy      laparoscopic  . Hernia repair      umbilical  . Skin cancer removed  2012    right ear, basal cell carcinoma.   Marland Kitchen Spinal stretching    . Colonoscopy  2012    [facility]  . Eus Left 06/07/2014    Procedure: UPPER ENDOSCOPIC ULTRASOUND (EUS) LINEAR;  Surgeon: Arta Silence, MD;  Location: [facility];  Service: Endoscopy;  Laterality: Left;  . Ercp N/A 06/07/2014    Procedure: ENDOSCOPIC RETROGRADE CHOLANGIOPANCREATOGRAPHY (ERCP);  Surgeon: Arta Silence, MD;  Location: [facility];  Service: Endoscopy;  Laterality: N/A;  . Eye surgery      bilateral cataracts with lens implant  . Cataract extraction Bilateral   . Port acath placement Left 06/14/14  . Portacath placement Left 06/14/2014    Procedure: INSERTION PORT-A-CATH;  Surgeon: Stark Klein, MD;  Location: [facility]S;  Service: General;  Laterality: Left;  . Esophagogastroduodenoscopy N/A 08/15/2014    Procedure: ESOPHAGOGASTRODUODENOSCOPY (EGD);  Surgeon: Lear Ng, MD;  Location: [facility];  Service: Endoscopy;  Laterality: N/A;   Social History:  reports that he quit smoking about 11 years ago. He has never used smokeless tobacco. He reports that he does not  drink alcohol or use illicit drugs.  Allergies  Allergen Reactions  . Ace Inhibitors Other (See Comments)    Unknown  . Ciprofloxacin Rash    Family History  Problem Relation Age of Onset  . Heart attack Mother   . Cancer Mother 77    lung  . Heart attack Father   . Stroke Father      Prior to Admission medications   Medication Sig Start Date End Date Taking? Authorizing Provider  acetaminophen (TYLENOL) 500 MG tablet Take 1,000 mg by mouth every 6 (six) hours as needed for fever.   Yes Historical Provider, MD  B Complex-C (SUPER B COMPLEX/VITAMIN C PO) Take 1 tablet by mouth daily.    Yes Historical  Provider, MD  cholecalciferol (VITAMIN D) 1000 UNITS tablet Take 1,000 Units by mouth daily.   Yes Historical Provider, MD  dasatinib (SPRYCEL) 50 MG tablet Take 1 tablet (50 mg total) by mouth daily. 04/24/15  Yes [name], MD  diltiazem (CARDIZEM CD) 120 MG 24 hr capsule Take 1 capsule (120 mg total) by mouth daily. 04/22/15  Yes Josue Hector, MD  Docusate Calcium (STOOL SOFTENER PO) Take 2 tablets by mouth as needed. For constipation   Yes Historical Provider, MD  HYDROmorphone (DILAUDID) 4 MG tablet Take 4 mg by mouth every 4 (four) hours as needed for severe pain.   Yes Historical Provider, MD  insulin glargine (LANTUS) 100 UNIT/ML injection Inject 18 Units into the skin at bedtime.  08/09/14  Yes Modena Jansky, MD  lactulose (CHRONULAC) 10 GM/15ML solution Take 10 g by mouth 2 (two) times daily as needed for moderate constipation.  06/21/14  Yes Historical Provider, MD  lidocaine-prilocaine (EMLA) cream Apply 1 application topically as needed. Before chemo   Yes Historical Provider, MD  losartan (COZAAR) 50 MG tablet Take 50 mg by mouth daily. 12/06/14  Yes Historical Provider, MD  magnesium oxide (MAG-OX) 400 MG tablet Take 1 tablet (400 mg total) by mouth daily. 03/06/15  Yes [name], MD  metFORMIN (GLUCOPHAGE) 1000 MG tablet Take 1,000 mg by mouth 2 (two) times daily with a meal.   Yes Historical Provider, MD  morphine (MSIR) 30 MG tablet Take 30 mg by mouth every 4 (four) hours as needed for severe pain. At bedtime for pain   Yes Historical Provider, MD  omeprazole (PRILOSEC) 20 MG capsule Take 20 mg by mouth as needed. For indigestion at night   Yes Historical Provider, MD  ondansetron (ZOFRAN) 8 MG tablet Take by mouth every 8 (eight) hours as needed for nausea or vomiting.   Yes Historical Provider, MD  pantoprazole (PROTONIX) 40 MG tablet Take 1 tablet (40 mg total) by mouth daily. 08/23/14  Yes [name], MD  promethazine (PHENERGAN) 25 MG tablet Take 25 mg by mouth every 6 (six)  hours as needed for nausea or vomiting (nausea).   Yes Historical Provider, MD  Rivaroxaban (XARELTO) 15 MG TABS tablet Take 1 tablet (15 mg total) by mouth daily with supper. 04/22/15  Yes Josue Hector, MD  sennosides-docusate sodium (SENOKOT-S) 8.6-50 MG tablet Take 2 tablets by mouth at bedtime.    Yes Historical Provider, MD  simvastatin (ZOCOR) 20 MG tablet Take 20 mg by mouth daily.    Yes Historical Provider, MD  traMADol (ULTRAM) 50 MG tablet Take 1 tablet (50 mg total) by mouth every 6 (six) hours as needed. Patient taking differently: Take 50 mg by mouth every 6 (six) hours as needed  for moderate pain.  04/29/15  Yes [name], MD  vitamin B-12 (CYANOCOBALAMIN) 1000 MCG tablet Take 1,000 mcg by mouth daily.   Yes Historical Provider, MD   Physical Exam: Filed Vitals:   05/07/15 2027  BP: 172/87  Pulse: 98  Temp: 102.1 F (38.9 C)  TempSrc: Oral  SpO2: 92%    Wt Readings from Last 3 Encounters:  04/29/15 103.465 kg (228 lb 1.6 oz)  04/10/15 106.051 kg (233 lb 12.8 oz)  04/01/15 105.688 kg (233 lb)    General:  Appears calm and comfortable Eyes: PERRL, normal lids, irises & conjunctiva ENT: grossly normal hearing, lips & tongue Neck: no LAD, masses or thyromegaly Cardiovascular: RRR, no m/r/g.+ LE edema. Respiratory: CTA bilaterally, no w/r/r. Normal respiratory effort. Abdomen: soft, ntnd Skin: no rash or induration seen on limited exam Musculoskeletal: grossly normal tone BUE/BLE Psychiatric: grossly normal mood and affect Neurologic: grossly non-focal.          Labs on Admission:  Basic Metabolic Panel:  Recent Labs Lab 05/07/15 2053  NA 140  K 3.7  CL 106  CO2 24  GLUCOSE 219*  BUN 11  CREATININE 1.17  CALCIUM 9.5   Liver Function Tests:  Recent Labs Lab 05/07/15 2053  AST 35  ALT 20  ALKPHOS 343*  BILITOT 0.7  PROT 6.7  ALBUMIN 3.7   No results for input(s): LIPASE, AMYLASE in the last 168 hours. No results for input(s): AMMONIA in the  last 168 hours. CBC:  Recent Labs Lab 05/07/15 2053  WBC 26.2*  NEUTROABS 22.5*  HGB 9.7*  HCT 29.8*  MCV 101.4*  PLT 117*   Cardiac Enzymes: No results for input(s): CKTOTAL, CKMB, CKMBINDEX, TROPONINI in the last 168 hours.  BNP (last 3 results)  Recent Labs  03/04/15 1610  BNP 295.2*    ProBNP (last 3 results) No results for input(s): PROBNP in the last 8760 hours.  CBG: No results for input(s): GLUCAP in the last 168 hours.  Radiological Exams on Admission: Dg Chest 2 View  05/07/2015   CLINICAL DATA:  Fever.  Patient denies cardiopulmonary symptoms.  EXAM: CHEST  2 VIEW  COMPARISON:  CT 04/04/2015.  Radiographs 04/04/2015.  FINDINGS: The cardiopericardial silhouette is enlarged. LEFT subclavian power port is unchanged. Bilateral subpulmonic effusions are present, with lateral peaking of the hemidiaphragms and blunting of both costophrenic angles. Mild thickening of the fissures is present on the lateral projection, suggesting interstitial pulmonary edema.  IMPRESSION: Chronic mild CHF. No interval change or superimposed acute cardiopulmonary disease.   Electronically Signed   By: Dereck Ligas M.D.   On: 05/07/2015 21:28     Assessment/Plan Active Problems:   Hypertension   CKD stage 3 due to type 2 diabetes mellitus   Malignant neoplasm of head of pancreas   Sepsis   1. Sepsis -started on sepsis protocol unclear source -will start on Vancocin and Zosyn -blood  Cultures ordered -will check UA and Cultures -CXR appears clear at this time   2. HTN -will continue with cardizem cozaar -monitor pressures  3. Malignant Neoplasm of Pancreas Head -being followed by the cancer center -currently undergoing chemotherapy  4. Diabetes Mellitus II -will check A1C -will continue with Lantus as ordered -will monitor FSBS with SSI   Code Status: Full Code (must indicate code status--if unknown or must be presumed, indicate so) DVT Prophylaxis:xaralto Family  Communication: None (indicate person spoken with, if applicable, with phone number if by telephone) Disposition Plan: Home (indicate anticipated  LOS)  Time spent: 51min  Brentin Shin A [facility] Pager [phone]

## 2015-05-07 NOTE — ED Notes (Signed)
Gave critical I stat Lactic result to MD Darl Householder.

## 2015-05-07 NOTE — Progress Notes (Signed)
The order for simvastatin(Zocor) was changed to an equivalent dose of atorvastatin(Lipitor) due to the potential drug interaction with diltiazem.  When taken in combination with medications that inhibit its metabolism, simvastatin can accumulate which increases the risk of liver toxicity, myopathy, or rhabdomyolysis.  Simvastatin dose should not exceed 10mg /day in patients taking verapamil, diltiazem, fibrates, or niacin >or= 1g/day.   Simvastatin dose should not exceed 20mg /day in patients taking amlodipine, ranolazine or amiodarone.   Please consider this potential interaction at discharge.  Dorrene German 05/07/2015 11:43 PM

## 2015-05-08 ENCOUNTER — Inpatient Hospital Stay (HOSPITAL_COMMUNITY): Payer: Medicare Other

## 2015-05-08 DIAGNOSIS — I10 Essential (primary) hypertension: Secondary | ICD-10-CM

## 2015-05-08 DIAGNOSIS — E1122 Type 2 diabetes mellitus with diabetic chronic kidney disease: Secondary | ICD-10-CM

## 2015-05-08 DIAGNOSIS — N183 Chronic kidney disease, stage 3 (moderate): Secondary | ICD-10-CM

## 2015-05-08 LAB — URINALYSIS, ROUTINE W REFLEX MICROSCOPIC
BILIRUBIN URINE: NEGATIVE
GLUCOSE, UA: NEGATIVE mg/dL
Hgb urine dipstick: NEGATIVE
KETONES UR: NEGATIVE mg/dL
Leukocytes, UA: NEGATIVE
Nitrite: NEGATIVE
Protein, ur: 30 mg/dL — AB
Specific Gravity, Urine: 1.009 (ref 1.005–1.030)
Urobilinogen, UA: 0.2 mg/dL (ref 0.0–1.0)
pH: 5.5 (ref 5.0–8.0)

## 2015-05-08 LAB — CBC
HEMATOCRIT: 26.1 % — AB (ref 39.0–52.0)
HEMOGLOBIN: 8.6 g/dL — AB (ref 13.0–17.0)
MCH: 33.3 pg (ref 26.0–34.0)
MCHC: 33 g/dL (ref 30.0–36.0)
MCV: 101.2 fL — AB (ref 78.0–100.0)
Platelets: 104 10*3/uL — ABNORMAL LOW (ref 150–400)
RBC: 2.58 MIL/uL — AB (ref 4.22–5.81)
RDW: 18.2 % — ABNORMAL HIGH (ref 11.5–15.5)
WBC: 23.8 10*3/uL — ABNORMAL HIGH (ref 4.0–10.5)

## 2015-05-08 LAB — COMPREHENSIVE METABOLIC PANEL
ALT: 17 U/L (ref 17–63)
ANION GAP: 7 (ref 5–15)
AST: 27 U/L (ref 15–41)
Albumin: 3 g/dL — ABNORMAL LOW (ref 3.5–5.0)
Alkaline Phosphatase: 282 U/L — ABNORMAL HIGH (ref 38–126)
BUN: 10 mg/dL (ref 6–20)
CALCIUM: 8.5 mg/dL — AB (ref 8.9–10.3)
CO2: 23 mmol/L (ref 22–32)
Chloride: 108 mmol/L (ref 101–111)
Creatinine, Ser: 1.11 mg/dL (ref 0.61–1.24)
GFR calc non Af Amer: >60 mL/min (ref >60)
GLUCOSE: 180 mg/dL — AB (ref 65–99)
Potassium: 3.3 mmol/L — ABNORMAL LOW (ref 3.5–5.1)
Sodium: 138 mmol/L (ref 135–145)
Total Bilirubin: 0.8 mg/dL (ref 0.3–1.2)
Total Protein: 5.7 g/dL — ABNORMAL LOW (ref 6.5–8.1)

## 2015-05-08 LAB — URINE MICROSCOPIC-ADD ON

## 2015-05-08 LAB — GLUCOSE, CAPILLARY
GLUCOSE-CAPILLARY: 160 mg/dL — AB (ref 65–99)
Glucose-Capillary: 118 mg/dL — ABNORMAL HIGH (ref 65–99)
Glucose-Capillary: 122 mg/dL — ABNORMAL HIGH (ref 65–99)

## 2015-05-08 LAB — PROTIME-INR
INR: 1.14 (ref 0.00–1.49)
Prothrombin Time: 14.8 seconds (ref 11.6–15.2)

## 2015-05-08 LAB — TSH: TSH: 1.852 u[IU]/mL (ref 0.350–4.500)

## 2015-05-08 LAB — PROCALCITONIN: Procalcitonin: 1.1 ng/mL

## 2015-05-08 LAB — LACTIC ACID, PLASMA: Lactic Acid, Venous: 1.1 mmol/L (ref 0.5–2.0)

## 2015-05-08 LAB — APTT: APTT: 32 s (ref 24–37)

## 2015-05-08 MED ORDER — INSULIN ASPART 100 UNIT/ML ~~LOC~~ SOLN: 0.0000 [IU] | Freq: Three times a day (TID) | SUBCUTANEOUS | Status: DC

## 2015-05-08 MED ORDER — SODIUM CHLORIDE 0.9 % IJ SOLN
10.0000 mL | INTRAMUSCULAR | Status: DC | PRN
  Administered 2015-05-08: 10 mL via INTRAVENOUS

## 2015-05-08 MED ORDER — INSULIN ASPART 100 UNIT/ML ~~LOC~~ SOLN: 0.0000 [IU] | Freq: Every day | SUBCUTANEOUS | Status: DC

## 2015-05-08 MED ORDER — POTASSIUM CHLORIDE CRYS ER 20 MEQ PO TBCR
40.0000 meq | EXTENDED_RELEASE_TABLET | Freq: Once | ORAL | Status: AC
  Administered 2015-05-08: 40 meq via ORAL
  Filled 2015-05-08: qty 2

## 2015-05-08 NOTE — Progress Notes (Signed)
[facility] PROGRESS NOTE  Evan [name] DZH:[id number] DOB: [birth year] DOA: 05/07/2015 PCP: [name], MD  Assessment/Plan: 1. Sepsis with possible CAP 1. Pt is continued on empiric vancomycin and zosyn 2. Blood cx pending, but thus far neg 3. Urine cx pending, UA unremarkable 2. HTN 1. BP stable 2. Cont to monitor 3. Malignant cancer at head of pancreas 1. Pt followed by Dr. Alvy Bimler 2. Pt undergoing chemo with repeat imaging planned for July 2016 4. DM2 1. Glucose remains stable 2. On metformin 3. Will add SSI coverage 5. DVT prophylaxis 1. On xarelto 6. Leukocytosis 1. Suspect secondary to above sepsis, although pt is s/p Neulasta on 6/14 2. Afebrile currently, with Tmax of 102.1 overnight  3. Improved slightly today 7. Hypokalemia 1. Replaced  Code Status: Full Family Communication: Pt in room (indicate person spoken with, relationship, and if by phone, the number) Disposition Plan: Pending   Consultants:    Procedures:    Antibiotics:  Vancomycin 6/21>>>  Zosyn 6/21>>>   HPI/Subjective: No complaints. Denies sob.  Objective: Filed Vitals:   05/07/15 2245 05/08/15 0224 05/08/15 0611 05/08/15 1355  BP: 145/63 132/77 156/86 142/70  Pulse: 99 82 94 80  Temp: 99.3 F (37.4 C) 98.7 F (37.1 C) 98.5 F (36.9 C) 98.3 F (36.8 C)  TempSrc: Oral Oral Oral Oral  Resp: 20  20 20   Height:   6\' 2"  (1.88 m)   SpO2: 92% 94% 92% 96%    Intake/Output Summary (Last 24 hours) at 05/08/15 1721 Last data filed at 05/08/15 1301  Gross per 24 hour  Intake    420 ml  Output   1050 ml  Net   -630 ml   There were no vitals filed for this visit.  Exam:   General:  Awake, in nad  Cardiovascular: regular, s1, s2  Respiratory: normal resp effort,no wheezing  Abdomen: soft,nondistended  Musculoskeletal: perfused, no clubbing   Data Reviewed: Basic Metabolic Panel:  Recent Labs Lab 05/07/15 2053 05/08/15 0155  NA 140 138  K 3.7  3.3*  CL 106 108  CO2 24 23  GLUCOSE 219* 180*  BUN 11 10  CREATININE 1.17 1.11  CALCIUM 9.5 8.5*   Liver Function Tests:  Recent Labs Lab 05/07/15 2053 05/08/15 0155  AST 35 27  ALT 20 17  ALKPHOS 343* 282*  BILITOT 0.7 0.8  PROT 6.7 5.7*  ALBUMIN 3.7 3.0*    Recent Labs Lab 05/07/15 2053  LIPASE <10*   No results for input(s): AMMONIA in the last 168 hours. CBC:  Recent Labs Lab 05/07/15 2053 05/08/15 0155  WBC 26.2* 23.8*  NEUTROABS 22.5*  --   HGB 9.7* 8.6*  HCT 29.8* 26.1*  MCV 101.4* 101.2*  PLT 117* 104*   Cardiac Enzymes: No results for input(s): CKTOTAL, CKMB, CKMBINDEX, TROPONINI in the last 168 hours. BNP (last 3 results)  Recent Labs  03/04/15 1610  BNP 295.2*    ProBNP (last 3 results) No results for input(s): PROBNP in the last 8760 hours.  CBG:  Recent Labs Lab 05/08/15 0004 05/08/15 0709  GLUCAP 160* 118*    Recent Results (from the past 240 hour(s))  Culture, blood (routine x 2)     Status: None (Preliminary result)   Collection Time: 05/07/15  8:56 PM  Result Value Ref Range Status   Specimen Description BLOOD LAC  Final   Special Requests BOTTLES DRAWN AEROBIC AND ANAEROBIC 5ML  Final   Culture   Final  NO GROWTH < 24 HOURS Performed at [facility]    Report Status PENDING  Incomplete  Culture, blood (routine x 2)     Status: None (Preliminary result)   Collection Time: 05/07/15  9:03 PM  Result Value Ref Range Status   Specimen Description BLOOD RAC  Final   Special Requests BOTTLES DRAWN AEROBIC AND ANAEROBIC 5ML  Final   Culture   Final    NO GROWTH < 24 HOURS Performed at [facility]    Report Status PENDING  Incomplete     Studies: Dg Chest 2 View  05/07/2015   CLINICAL DATA:  Fever.  Patient denies cardiopulmonary symptoms.  EXAM: CHEST  2 VIEW  COMPARISON:  CT 04/04/2015.  Radiographs 04/04/2015.  FINDINGS: The cardiopericardial silhouette is enlarged. LEFT subclavian power port is  unchanged. Bilateral subpulmonic effusions are present, with lateral peaking of the hemidiaphragms and blunting of both costophrenic angles. Mild thickening of the fissures is present on the lateral projection, suggesting interstitial pulmonary edema.  IMPRESSION: Chronic mild CHF. No interval change or superimposed acute cardiopulmonary disease.   Electronically Signed   By: Dereck Ligas M.D.   On: 05/07/2015 21:28    Scheduled Meds: . atorvastatin  10 mg Oral q1800  . dasatinib  50 mg Oral Daily  . diltiazem  120 mg Oral Daily  . docusate sodium  100 mg Oral BID  . [START ON 05/09/2015] insulin aspart  0-15 Units Subcutaneous TID WC  . insulin aspart  0-5 Units Subcutaneous QHS  . insulin glargine  18 Units Subcutaneous QHS  . losartan  50 mg Oral Daily  . magnesium oxide  400 mg Oral Daily  . metFORMIN  1,000 mg Oral BID WC  . omeprazole  20 mg Oral Daily  . piperacillin-tazobactam (ZOSYN)  IV  3.375 g Intravenous Q8H  . Rivaroxaban  15 mg Oral Q supper  . senna-docusate  2 tablet Oral QHS  . sodium chloride  3 mL Intravenous Q12H  . vancomycin  750 mg Intravenous Q12H   Continuous Infusions:   Active Problems:   Hypertension   CKD stage 3 due to type 2 diabetes mellitus   Malignant neoplasm of head of pancreas   Sepsis    Castor Gittleman, [facility] Pager [phone]. If 7PM-7AM, please contact night-coverage at [email or web address], password [facility] 05/08/2015, 5:21 PM  LOS: 1 day

## 2015-05-08 NOTE — Progress Notes (Signed)
[facility]  Patient Status: Active (receiving services up to time of hospitalization)  [facility] is providing the following services: RN  If patient discharges after hours, please call [phone].   Lurlean Leyden 05/08/2015, 4:48 PM

## 2015-05-09 ENCOUNTER — Encounter: Payer: Self-pay | Admitting: *Deleted

## 2015-05-09 DIAGNOSIS — E1140 Type 2 diabetes mellitus with diabetic neuropathy, unspecified: Secondary | ICD-10-CM

## 2015-05-09 DIAGNOSIS — D72829 Elevated white blood cell count, unspecified: Secondary | ICD-10-CM

## 2015-05-09 DIAGNOSIS — D630 Anemia in neoplastic disease: Secondary | ICD-10-CM

## 2015-05-09 DIAGNOSIS — C250 Malignant neoplasm of head of pancreas: Secondary | ICD-10-CM

## 2015-05-09 DIAGNOSIS — R6510 Systemic inflammatory response syndrome (SIRS) of non-infectious origin without acute organ dysfunction: Secondary | ICD-10-CM | POA: Insufficient documentation

## 2015-05-09 DIAGNOSIS — R600 Localized edema: Secondary | ICD-10-CM

## 2015-05-09 DIAGNOSIS — A419 Sepsis, unspecified organism: Principal | ICD-10-CM

## 2015-05-09 LAB — CBC WITH DIFFERENTIAL/PLATELET
BASOS ABS: 0 10*3/uL (ref 0.0–0.1)
BASOS PCT: 0 % (ref 0–1)
Eosinophils Absolute: 0.3 10*3/uL (ref 0.0–0.7)
Eosinophils Relative: 1 % (ref 0–5)
HEMATOCRIT: 28.5 % — AB (ref 39.0–52.0)
Hemoglobin: 9.2 g/dL — ABNORMAL LOW (ref 13.0–17.0)
LYMPHS PCT: 6 % — AB (ref 12–46)
Lymphs Abs: 1.6 10*3/uL (ref 0.7–4.0)
MCH: 32.9 pg (ref 26.0–34.0)
MCHC: 32.3 g/dL (ref 30.0–36.0)
MCV: 101.8 fL — AB (ref 78.0–100.0)
Monocytes Absolute: 1.9 10*3/uL — ABNORMAL HIGH (ref 0.1–1.0)
Monocytes Relative: 7 % (ref 3–12)
Neutro Abs: 22.7 10*3/uL — ABNORMAL HIGH (ref 1.7–7.7)
Neutrophils Relative %: 86 % — ABNORMAL HIGH (ref 43–77)
PLATELETS: 159 10*3/uL (ref 150–400)
RBC: 2.8 MIL/uL — ABNORMAL LOW (ref 4.22–5.81)
RDW: 19 % — AB (ref 11.5–15.5)
WBC: 26.5 10*3/uL — ABNORMAL HIGH (ref 4.0–10.5)

## 2015-05-09 LAB — BASIC METABOLIC PANEL
Anion gap: 10 (ref 5–15)
BUN: 10 mg/dL (ref 6–20)
CO2: 25 mmol/L (ref 22–32)
CREATININE: 1.17 mg/dL (ref 0.61–1.24)
Calcium: 9.2 mg/dL (ref 8.9–10.3)
Chloride: 107 mmol/L (ref 101–111)
GFR calc non Af Amer: >60 mL/min (ref >60)
Glucose, Bld: 81 mg/dL (ref 65–99)
POTASSIUM: 3.9 mmol/L (ref 3.5–5.1)
SODIUM: 142 mmol/L (ref 135–145)

## 2015-05-09 LAB — URINE CULTURE: Culture: NO GROWTH

## 2015-05-09 LAB — HEMOGLOBIN A1C
Hgb A1c MFr Bld: 6.1 % — ABNORMAL HIGH (ref 4.8–5.6)
Mean Plasma Glucose: 128 mg/dL

## 2015-05-09 LAB — GLUCOSE, CAPILLARY
GLUCOSE-CAPILLARY: 113 mg/dL — AB (ref 65–99)
Glucose-Capillary: 87 mg/dL (ref 65–99)
Glucose-Capillary: 117 mg/dL — ABNORMAL HIGH (ref 65–99)
Glucose-Capillary: 131 mg/dL — ABNORMAL HIGH (ref 65–99)

## 2015-05-09 MED ORDER — SODIUM CHLORIDE 0.9 % IJ SOLN
10.0000 mL | INTRAMUSCULAR | Status: DC | PRN
  Administered 2015-05-09 – 2015-05-10 (×2): 10 mL
  Filled 2015-05-09 (×2): qty 40

## 2015-05-09 MED ORDER — LABETALOL HCL 5 MG/ML IV SOLN
5.0000 mg | INTRAVENOUS | Status: DC | PRN
  Administered 2015-05-09: 5 mg via INTRAVENOUS
  Filled 2015-05-09: qty 4

## 2015-05-09 NOTE — Progress Notes (Addendum)
Pt HR sustaining in the 120's-140's. MD made aware, 5 mg labetalol given. Will continue to monitor closely.  HR back down in the 90's after dose.

## 2015-05-09 NOTE — Progress Notes (Signed)
Evan [name]   DOB:05-28-1941   HG#:[id number]    Oncology History   Pancreatic cancer   Primary site: Pancreas   Staging method: AJCC 7th Edition   Clinical: Stage IIA (T3, N0, M0) signed by [name], MD on 06/13/2014  4:37 PM   Summary: Stage IIA (T3, N0, M0)       Malignant neoplasm of head of pancreas   06/05/2014 Imaging  MRI abdomen showed 2.2 cm lesion in the pancreatic head obstructing the common bile duct and the pancreatic duct is highly concerning for pancreatic adenocarcinoma.  There is moderate intra and extrahepatic biliary duct dilatation.    06/05/2014 Tumor Marker CA 19-9 is elevated at 1234   06/06/2014 Imaging Ct scan of chest showed 2 lung nodules, likely benign   06/07/2014 Procedure He had ERCP and stent placement   06/07/2014 Procedure He had endoscopic ultrasound with  fine needle aspiration.   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   10/18/2014 Imaging CT scan of the chest, abdomen and pelvis show metastatic disease in the lung and liver.   10/18/2014 Tumor Marker CA-19-9 at 1745   10/22/2014 - 12/03/2014 Chemotherapy The patient is placed on modified FOLFOX chemotherapy   11/05/2014 Tumor Marker Ca19-9 at 2447   12/14/2014 Imaging CT scan showed disease progression in the liver   12/14/2014 Tumor Marker CA 19-9 at 2050   12/17/2014 -   Chemotherapy the patient was starting on Abraxane with reduced dose Gemzar   12/24/2014 Tumor Marker CA-19-9 is at 1387.   12/26/2014 - 12/29/2014 Hospital Admission the patient was admitted to the hospital for hospital-acquired pneumonia.   01/07/2015 Adverse Reaction cycle 2 of treatment with reduced dose Abraxane due to recent hospitalization   01/12/2015 - 01/16/2015 Hospital Admission  the patient was admitted to the hospital again for hospital acquired pneumonia.   03/04/2015 Imaging Repeat CT scans show improvement of disease control.    Subjective: Patient was admitted to the hospital 2 days ago complaining of fever, chills and weakness. He was noted to have low-grade fever with leukocytosis and tachycardia. So far, chest x-ray, urine culture and blood culture did not show signs of localizing infection. The patient was started on broad-spectrum IV anti-biotics and is feeling better. He denies further sensation of chills or weakness. No further fevers were documented within the last 24 hours. He has poor appetite because of poor choices of hospital food. He had mild constipation resolved with laxatives. He complained of mild bilateral lower extremity edema.  Objective:  Filed Vitals:   05/09/15 1519  BP: 123/76  Pulse: 116  Temp:   Resp:      Intake/Output Summary (Last 24 hours) at 05/09/15 1610 Last data filed at 05/09/15 0830  Gross per 24 hour  Intake    370 ml  Output   1200 ml  Net   -830 ml    GENERAL:alert, no distress and comfortable SKIN: skin color, texture, turgor are  normal, no rashes or significant lesions EYES: normal, Conjunctiva are pink and non-injected, sclera clear OROPHARYNX:no exudate, no erythema and lips, buccal mucosa, and tongue normal  NECK: supple, thyroid normal size, non-tender, without nodularity LYMPH:  no palpable lymphadenopathy in the cervical, axillary or inguinal LUNGS: clear to auscultation and percussion with normal breathing effort HEART:  regular rate & rhythm and no murmurs with mild bilateral extremity edema ABDOMEN:abdomen soft, non-tender and normal bowel sounds Musculoskeletal:no cyanosis of digits and no clubbing  NEURO: alert & oriented x 3 with fluent speech, no focal motor/sensory deficits   Labs:  Lab Results  Component Value Date   WBC 26.5* 05/09/2015   HGB 9.2* 05/09/2015   HCT 28.5* 05/09/2015   MCV 101.8* 05/09/2015   PLT 159 05/09/2015   NEUTROABS 22.7* 05/09/2015    Lab Results  Component Value Date   NA 142 05/09/2015   K 3.9 05/09/2015   CL 107 05/09/2015   CO2 25 05/09/2015    Studies:  Dg Chest 2 View  05/07/2015   CLINICAL DATA:  Fever.  Patient denies cardiopulmonary symptoms.  EXAM: CHEST  2 VIEW  COMPARISON:  CT 04/04/2015.  Radiographs 04/04/2015.  FINDINGS: The cardiopericardial silhouette is enlarged. LEFT subclavian power port is unchanged. Bilateral subpulmonic effusions are present, with lateral peaking of the hemidiaphragms and blunting of both costophrenic angles. Mild thickening of the fissures is present on the lateral projection, suggesting interstitial pulmonary edema.  IMPRESSION: Chronic mild CHF. No interval change or superimposed acute cardiopulmonary disease.   Electronically Signed   By: Dereck Ligas M.D.   On: 05/07/2015 21:28    Assessment & Plan:  Possible sepsis with leukocytosis The leukocytosis certainly could be related to infection or recent Neulasta injection. His symptoms of fever, chills and weakness has resolved with IV fluid resuscitation and broad-spectrum IV anti-biotics. No source of infection is found so far. If he remained culture-negative tomorrow, I suspect we can probably switch IV anti-biotics to oral agents and be discharged.  Malignant neoplasm of head of pancreas After I reduced the dose of chemotherapy with Abraxane 20% along with reduced dose Gemzar by 50% plus G-CSF support with each cycle, He tolerated that better. Recent repeat CT scan  show reduction in the size of cancer His next repeat imaging is in July 2016. I noted that the tumor markers fluctuated up and down. I discussed with the patient and his wife the limitation of tumor marker monitoring.  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 The patient denies recent history of bleeding such as epistaxis, hematuria or hematochezia. [name] I will observe for now. He does not require transfusion now.  Bilateral leg edema This is related to low albumin state. I recommend cutting down on salt ingestion and elastic compression hose.   Paroxysmal a-fib He has paroxsymal atrial fibrillation recently. EMT documented atrial fibrillation but EKG at the emergency department showed normal sinus rhythm. BNP was mildly elevated in the ER but he has resolved back to normal. He follows with cardiologist. Of note, recent thyroid function tests were within normal limits He will continue medical management and anticoagulation therapy as directed.  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 I will return tomorrow to follow-up on the patient.  [facility], [place], MD 05/09/2015  4:10 PM

## 2015-05-09 NOTE — Progress Notes (Signed)
[facility] PROGRESS NOTE  Evan [name] JZP:[id number] DOB: [birth year] DOA: 05/07/2015 PCP: [name], MD  Assessment/Plan: 1. Sepsis with possible CAP 1. Pt is continued on empiric vancomycin and zosyn 2. Blood cx pending, but thus far neg 3. Urine cx pending, UA unremarkable 4. Appreciate Oncology input 5. If cx remain neg in AM, transition to PO abx and d/c home 2. HTN 1. BP stable 2. Cont to monitor 3. Malignant cancer at head of pancreas 1. Pt followed by Dr. Alvy Bimler 2. Pt undergoing chemo with repeat imaging planned for July 2016 4. DM2 1. Glucose remains stable 2. On metformin 3. Will add SSI coverage 5. DVT prophylaxis 1. On xarelto 6. Leukocytosis 1. Suspect secondary to above sepsis, although pt is s/p Neulasta on 6/14 2. Afebrile currently, with Tmax of 102.1 on day of admit 3. Improved slightly today 7. Hypokalemia 1. Replaced  Code Status: Full Family Communication: Pt in room, wife at bedside Disposition Plan: Pending   Consultants:    Procedures:    Antibiotics:  Vancomycin 6/21>>>  Zosyn 6/21>>>   HPI/Subjective: Feels better. Eager to go home  Objective: Filed Vitals:   05/08/15 2125 05/09/15 0545 05/09/15 1322 05/09/15 1519  BP: 148/67 144/73 140/76 123/76  Pulse: 90 85 81 116  Temp: 98.7 F (37.1 C) 98.5 F (36.9 C) 97.8 F (36.6 C)   TempSrc: Oral Oral Oral   Resp: 19 18 20    Height:      Weight:      SpO2: 94% 95% 96%     Intake/Output Summary (Last 24 hours) at 05/09/15 1748 Last data filed at 05/09/15 0830  Gross per 24 hour  Intake    370 ml  Output   1200 ml  Net   -830 ml   Filed Weights   05/08/15 0611  Weight: 103.42 kg (228 lb)    Exam:   General:  Awake, sitting in bed, in nad  Cardiovascular: regular, s1, s2  Respiratory: normal resp effort,no wheezing  Abdomen: soft,nondistended  Musculoskeletal: perfused, no clubbing   Data Reviewed: Basic Metabolic Panel:  Recent  Labs Lab 05/07/15 2053 05/08/15 0155 05/09/15 0540  NA 140 138 142  K 3.7 3.3* 3.9  CL 106 108 107  CO2 24 23 25   GLUCOSE 219* 180* 81  BUN 11 10 10   CREATININE 1.17 1.11 1.17  CALCIUM 9.5 8.5* 9.2   Liver Function Tests:  Recent Labs Lab 05/07/15 2053 05/08/15 0155  AST 35 27  ALT 20 17  ALKPHOS 343* 282*  BILITOT 0.7 0.8  PROT 6.7 5.7*  ALBUMIN 3.7 3.0*    Recent Labs Lab 05/07/15 2053  LIPASE <10*   No results for input(s): AMMONIA in the last 168 hours. CBC:  Recent Labs Lab 05/07/15 2053 05/08/15 0155 05/09/15 0540  WBC 26.2* 23.8* 26.5*  NEUTROABS 22.5*  --  22.7*  HGB 9.7* 8.6* 9.2*  HCT 29.8* 26.1* 28.5*  MCV 101.4* 101.2* 101.8*  PLT 117* 104* 159   Cardiac Enzymes: No results for input(s): CKTOTAL, CKMB, CKMBINDEX, TROPONINI in the last 168 hours. BNP (last 3 results)  Recent Labs  03/04/15 1610  BNP 295.2*    ProBNP (last 3 results) No results for input(s): PROBNP in the last 8760 hours.  CBG:  Recent Labs Lab 05/08/15 0004 05/08/15 0709 05/08/15 2124 05/09/15 0738 05/09/15 1230  GLUCAP 160* 118* 122* 87 117*    Recent Results (from the past 240 hour(s))  Culture, blood (routine x 2)  Status: None (Preliminary result)   Collection Time: 05/07/15  8:56 PM  Result Value Ref Range Status   Specimen Description BLOOD LAC  Final   Special Requests BOTTLES DRAWN AEROBIC AND ANAEROBIC 5ML  Final   Culture   Final    NO GROWTH 2 DAYS Performed at [facility]    Report Status PENDING  Incomplete  Culture, blood (routine x 2)     Status: None (Preliminary result)   Collection Time: 05/07/15  9:03 PM  Result Value Ref Range Status   Specimen Description BLOOD RAC  Final   Special Requests BOTTLES DRAWN AEROBIC AND ANAEROBIC 5ML  Final   Culture   Final    NO GROWTH 2 DAYS Performed at [facility]    Report Status PENDING  Incomplete  Urine culture     Status: None   Collection Time: 05/08/15  6:09 AM   Result Value Ref Range Status   Specimen Description URINE, CLEAN CATCH  Final   Special Requests NONE  Final   Culture   Final    NO GROWTH 1 DAY Performed at [facility]    Report Status 05/09/2015 FINAL  Final     Studies: Dg Chest 2 View  05/07/2015   CLINICAL DATA:  Fever.  Patient denies cardiopulmonary symptoms.  EXAM: CHEST  2 VIEW  COMPARISON:  CT 04/04/2015.  Radiographs 04/04/2015.  FINDINGS: The cardiopericardial silhouette is enlarged. LEFT subclavian power port is unchanged. Bilateral subpulmonic effusions are present, with lateral peaking of the hemidiaphragms and blunting of both costophrenic angles. Mild thickening of the fissures is present on the lateral projection, suggesting interstitial pulmonary edema.  IMPRESSION: Chronic mild CHF. No interval change or superimposed acute cardiopulmonary disease.   Electronically Signed   By: Dereck Ligas M.D.   On: 05/07/2015 21:28    Scheduled Meds: . atorvastatin  10 mg Oral q1800  . dasatinib  50 mg Oral Daily  . diltiazem  120 mg Oral Daily  . docusate sodium  100 mg Oral BID  . insulin aspart  0-15 Units Subcutaneous TID WC  . insulin aspart  0-5 Units Subcutaneous QHS  . insulin glargine  18 Units Subcutaneous QHS  . losartan  50 mg Oral Daily  . magnesium oxide  400 mg Oral Daily  . metFORMIN  1,000 mg Oral BID WC  . omeprazole  20 mg Oral Daily  . piperacillin-tazobactam (ZOSYN)  IV  3.375 g Intravenous Q8H  . Rivaroxaban  15 mg Oral Q supper  . senna-docusate  2 tablet Oral QHS  . sodium chloride  3 mL Intravenous Q12H  . vancomycin  750 mg Intravenous Q12H   Continuous Infusions:   Active Problems:   Hypertension   CKD stage 3 due to type 2 diabetes mellitus   Malignant neoplasm of head of pancreas   Sepsis    [facility], [place] [facility] Pager [phone]. If 7PM-7AM, please contact night-coverage at [email or web address], password [facility] 05/09/2015, 5:48 PM  LOS: 2 days

## 2015-05-09 NOTE — Consult Note (Addendum)
   [facility] Inpatient Consult   05/09/2015  Evan [name] 24-Feb-1941 [id number]   Patient evaluated for [place] [facility] services. Spoke with patient and wife who are both agreeable to [place] [facility]. Consents obtained. Mrs Mceuen indicates patient will have [place] as well. Informed her that [place] [facility] will not interfere or replace services provided by home health but will add an additional layer of support. Patient has been undergoing cancer treatments on Mondays for two weeks and then off a week. Discussed DM and recent diagnosis of Aflutter. She states he has DM but it has been under control. Discussed [facility] to follow up for post transition of care calls and possible home visits if warranted. Made inpatient RNCM aware that [facility] to follow.  Marthenia Rolling, MSN-Ed, RN,BSN [facility] Liaison [phone]

## 2015-05-10 ENCOUNTER — Other Ambulatory Visit: Payer: Self-pay | Admitting: Hematology and Oncology

## 2015-05-10 ENCOUNTER — Other Ambulatory Visit: Payer: Self-pay | Admitting: *Deleted

## 2015-05-10 LAB — GLUCOSE, CAPILLARY: Glucose-Capillary: 79 mg/dL (ref 65–99)

## 2015-05-10 LAB — CBC
HEMATOCRIT: 27.3 % — AB (ref 39.0–52.0)
HEMOGLOBIN: 8.9 g/dL — AB (ref 13.0–17.0)
MCH: 33.2 pg (ref 26.0–34.0)
MCHC: 32.6 g/dL (ref 30.0–36.0)
MCV: 101.9 fL — ABNORMAL HIGH (ref 78.0–100.0)
Platelets: 190 10*3/uL (ref 150–400)
RBC: 2.68 MIL/uL — ABNORMAL LOW (ref 4.22–5.81)
RDW: 18.6 % — AB (ref 11.5–15.5)
WBC: 24.2 10*3/uL — ABNORMAL HIGH (ref 4.0–10.5)

## 2015-05-10 MED ORDER — CEFUROXIME AXETIL 250 MG PO TABS: 250.0000 mg | ORAL_TABLET | Freq: Two times a day (BID) | ORAL | Status: DC

## 2015-05-10 MED ORDER — AZITHROMYCIN 250 MG PO TABS: ORAL_TABLET | ORAL | Status: DC

## 2015-05-10 MED ORDER — ATORVASTATIN CALCIUM 10 MG PO TABS: 10.0000 mg | ORAL_TABLET | Freq: Every day | ORAL | Status: DC

## 2015-05-10 MED ORDER — HEPARIN SOD (PORK) LOCK FLUSH 100 UNIT/ML IV SOLN
500.0000 [IU] | INTRAVENOUS | Status: AC | PRN
  Administered 2015-05-10: 500 [IU]

## 2015-05-10 NOTE — Discharge Summary (Addendum)
Physician Discharge Summary  Evan [name] YQM:[id number] DOB: 1941/07/23 DOA: 05/07/2015  PCP: [name], MD  Admit date: 05/07/2015 Discharge date: 05/10/2015  Time spent: 20 minutes  Recommendations for Outpatient Follow-up:  1. Follow up with PCP in 1-2 weeks 2. Follow up with Oncology as scheduled  Discharge Diagnoses:  Active Problems:   Hypertension   CKD stage 3 due to type 2 diabetes mellitus   Malignant neoplasm of head of pancreas   Sepsis   Leukocytosis   SIRS (systemic inflammatory response syndrome)   Discharge Condition: Improved  Diet recommendation: Regular  Filed Weights   05/08/15 0611  Weight: 103.42 kg (228 lb)    History of present illness:  Please see admit h and p from 6/21 for details. Briefly, pt presented with concerns of possible sepsis with CAP. Pt was admitted for further work up.  Hospital Course:  1. Sepsis with possible CAP 1. Pt was initially continued on empiric vancomycin and zosyn 2. Blood cx pending, but thus far neg 3. Urine cx pending, UA unremarkable 4. Appreciate Oncology input 5. Cx remained neg and pt remained stable. Would transition pt to ceftin with azithromycin to complete empiric abx 2. HTN 1. BP remained stable 2. Cont to monitor 3. Malignant cancer at head of pancreas 1. Pt followed by Dr. Alvy Bimler 2. Pt undergoing chemo with repeat imaging planned for July 2016 4. DM2 1. Glucose remained stable 2. On metformin 3. On SSI coverage 5. DVT prophylaxis 1. On xarelto 6. Leukocytosis 1. Possibly secondary to above sepsis, although pt is also s/p Neulasta on 6/14 2. Afebrile currently, with Tmax of 102.1 on day of admit, afebrile since 3. Improving 7. Hypokalemia 1. Replaced  Consultations:  Oncology  Discharge Exam: Filed Vitals:   05/09/15 1322 05/09/15 1519 05/09/15 2204 05/10/15 0516  BP: 140/76 123/76 139/64 159/73  Pulse: 81 116 80 89  Temp: 97.8 F (36.6 C)  98.4 F (36.9 C) 98.5 F  (36.9 C)  TempSrc: Oral  Oral Oral  Resp: 20  20 20   Height:      Weight:      SpO2: 96%  96% 92%    General: awake, in nad Cardiovascular: regular, s1, s2 Respiratory: normal resp effort, no wheezing  Discharge Instructions      Discharge Instructions    AMB Referral to [place] [facility]    Complete by:  As directed   Please assign to [facility] for post transition of care follow up. Also has [facility] for RN services. Admit x 3 in past 6 months. Currently inpatient with sepsis. Receiving tx for pancreatic cancer. Has DM as well. Consents signed. Likely discharge tomorrow 05/10/15. Thanks. Marthenia Rolling, [place], [facility] Liaison-[phone]  Reason for consult:  Please assign to [facility] RNCM  Diagnoses of:  Diabetes  Expected date of contact:  1-3 days (reserved for hospital discharges)            Medication List    TAKE these medications        acetaminophen 500 MG tablet  Commonly known as:  TYLENOL  Take 1,000 mg by mouth every 6 (six) hours as needed for fever.     azithromycin 250 MG tablet  Commonly known as:  ZITHROMAX  1 tab (250mg ) po qday x 4 days, zero refills     cefUROXime 250 MG tablet  Commonly known as:  CEFTIN  Take 1 tablet (250 mg total) by mouth 2 (two) times daily with a meal.  cholecalciferol 1000 UNITS tablet  Commonly known as:  VITAMIN D  Take 1,000 Units by mouth daily.     dasatinib 50 MG tablet  Commonly known as:  SPRYCEL  Take 1 tablet (50 mg total) by mouth daily.     diltiazem 120 MG 24 hr capsule  Commonly known as:  CARDIZEM CD  Take 1 capsule (120 mg total) by mouth daily.     HYDROmorphone 4 MG tablet  Commonly known as:  DILAUDID  Take 4 mg by mouth every 4 (four) hours as needed for severe pain.     insulin glargine 100 UNIT/ML injection  Commonly known as:  LANTUS  Inject 18 Units into the skin at bedtime.     lactulose 10 GM/15ML solution  Commonly known as:  CHRONULAC  Take 10 g by mouth 2  (two) times daily as needed for moderate constipation.     lidocaine-prilocaine cream  Commonly known as:  EMLA  Apply 1 application topically as needed. Before chemo     losartan 50 MG tablet  Commonly known as:  COZAAR  Take 50 mg by mouth daily.     magnesium oxide 400 MG tablet  Commonly known as:  MAG-OX  Take 1 tablet (400 mg total) by mouth daily.     metFORMIN 1000 MG tablet  Commonly known as:  GLUCOPHAGE  Take 1,000 mg by mouth 2 (two) times daily with a meal.     morphine 30 MG tablet  Commonly known as:  MSIR  Take 30 mg by mouth every 4 (four) hours as needed for severe pain. At bedtime for pain     omeprazole 20 MG capsule  Commonly known as:  PRILOSEC  Take 20 mg by mouth as needed. For indigestion at night     ondansetron 8 MG tablet  Commonly known as:  ZOFRAN  Take by mouth every 8 (eight) hours as needed for nausea or vomiting.     pantoprazole 40 MG tablet  Commonly known as:  PROTONIX  Take 1 tablet (40 mg total) by mouth daily.     promethazine 25 MG tablet  Commonly known as:  PHENERGAN  Take 25 mg by mouth every 6 (six) hours as needed for nausea or vomiting (nausea).     Rivaroxaban 15 MG Tabs tablet  Commonly known as:  XARELTO  Take 1 tablet (15 mg total) by mouth daily with supper.     sennosides-docusate sodium 8.6-50 MG tablet  Commonly known as:  SENOKOT-S  Take 2 tablets by mouth at bedtime.     simvastatin 20 MG tablet  Commonly known as:  ZOCOR  Take 20 mg by mouth daily.     STOOL SOFTENER PO  Take 2 tablets by mouth as needed. For constipation     SUPER B COMPLEX/VITAMIN C PO  Take 1 tablet by mouth daily.     traMADol 50 MG tablet  Commonly known as:  ULTRAM  Take 1 tablet (50 mg total) by mouth every 6 (six) hours as needed.     vitamin B-12 1000 MCG tablet  Commonly known as:  CYANOCOBALAMIN  Take 1,000 mcg by mouth daily.       Allergies  Allergen Reactions  . Ace Inhibitors Other (See Comments)    Unknown   . Ciprofloxacin Rash   Follow-up Information    Follow up with [name], MD. Schedule an appointment as soon as possible for a visit in 1 week.   Specialty:  Internal  Medicine   Why:  Hospital follow up   Contact information:   [place] [address] [employer] [address] [place] [address] [address] [phone]       Follow up with [facility], NI, MD.   Specialty:  Hematology and Oncology   Why:  as scheduled   Contact information:   [place] [address] [phone]        The results of significant diagnostics from this hospitalization (including imaging, microbiology, ancillary and laboratory) are listed below for reference.    Significant Diagnostic Studies: Dg Chest 2 View  05/07/2015   CLINICAL DATA:  Fever.  Patient denies cardiopulmonary symptoms.  EXAM: CHEST  2 VIEW  COMPARISON:  CT 04/04/2015.  Radiographs 04/04/2015.  FINDINGS: The cardiopericardial silhouette is enlarged. LEFT subclavian power port is unchanged. Bilateral subpulmonic effusions are present, with lateral peaking of the hemidiaphragms and blunting of both costophrenic angles. Mild thickening of the fissures is present on the lateral projection, suggesting interstitial pulmonary edema.  IMPRESSION: Chronic mild CHF. No interval change or superimposed acute cardiopulmonary disease.   Electronically Signed   By: Dereck Ligas M.D.   On: 05/07/2015 21:28    Microbiology: Recent Results (from the past 240 hour(s))  Culture, blood (routine x 2)     Status: None (Preliminary result)   Collection Time: 05/07/15  8:56 PM  Result Value Ref Range Status   Specimen Description BLOOD LAC  Final   Special Requests BOTTLES DRAWN AEROBIC AND ANAEROBIC 5ML  Final   Culture   Final    NO GROWTH 2 DAYS Performed at [facility]    Report Status PENDING  Incomplete  Culture, blood (routine x 2)     Status: None (Preliminary result)   Collection Time: 05/07/15  9:03 PM  Result Value Ref Range Status    Specimen Description BLOOD RAC  Final   Special Requests BOTTLES DRAWN AEROBIC AND ANAEROBIC 5ML  Final   Culture   Final    NO GROWTH 2 DAYS Performed at [facility]    Report Status PENDING  Incomplete  Urine culture     Status: None   Collection Time: 05/08/15  6:09 AM  Result Value Ref Range Status   Specimen Description URINE, CLEAN CATCH  Final   Special Requests NONE  Final   Culture   Final    NO GROWTH 1 DAY Performed at [facility]    Report Status 05/09/2015 FINAL  Final     Labs: Basic Metabolic Panel:  Recent Labs Lab 05/07/15 2053 05/08/15 0155 05/09/15 0540  NA 140 138 142  K 3.7 3.3* 3.9  CL 106 108 107  CO2 24 23 25   GLUCOSE 219* 180* 81  BUN 11 10 10   CREATININE 1.17 1.11 1.17  CALCIUM 9.5 8.5* 9.2   Liver Function Tests:  Recent Labs Lab 05/07/15 2053 05/08/15 0155  AST 35 27  ALT 20 17  ALKPHOS 343* 282*  BILITOT 0.7 0.8  PROT 6.7 5.7*  ALBUMIN 3.7 3.0*    Recent Labs Lab 05/07/15 2053  LIPASE <10*   No results for input(s): AMMONIA in the last 168 hours. CBC:  Recent Labs Lab 05/07/15 2053 05/08/15 0155 05/09/15 0540 05/10/15 0500  WBC 26.2* 23.8* 26.5* 24.2*  NEUTROABS 22.5*  --  22.7*  --   HGB 9.7* 8.6* 9.2* 8.9*  HCT 29.8* 26.1* 28.5* 27.3*  MCV 101.4* 101.2* 101.8* 101.9*  PLT 117* 104* 159 190   Cardiac  Enzymes: No results for input(s): CKTOTAL, CKMB, CKMBINDEX, TROPONINI in the last 168 hours. BNP: BNP (last 3 results)  Recent Labs  03/04/15 1610  BNP 295.2*    ProBNP (last 3 results) No results for input(s): PROBNP in the last 8760 hours.  CBG:  Recent Labs Lab 05/09/15 0738 05/09/15 1230 05/09/15 1752 05/09/15 2203 05/10/15 0754  GLUCAP 87 117* 113* 131* 79    Signed:  Goku Har[place] K  [facility] 05/10/2015, 10:51 AM

## 2015-05-10 NOTE — Progress Notes (Signed)
Went over all discharge information with pt and family, all questions answered.  Prescriptions given.  Explained importance of taking medications as prescribed.  Pt will be wheeled out by NT.  Pt family did not want [place] to make a primary follow up appointment, she said she would call the office when she got home.

## 2015-05-10 NOTE — Patient Outreach (Signed)
Attempt made to contact pt as part of transition of care (discharged 6/24).   HIPPA compliant voice message left with contact number.   If no response, will try again 6/27.     Plan to call pt again on  6/27 if no response today.     Zara Chess.   [facility] [facility]  [phone]

## 2015-05-10 NOTE — Patient Outreach (Signed)
[facility]) Care Management  05/10/2015  Evan [name] 03-Jan-1941 [id number]   Referral from Marthenia Rolling, RN, assigned Kathie Rhodes, RN to outreach patient.  [name]. [place], [place] Management [place] Assistant Phone: [phone] Fax: [phone]

## 2015-05-10 NOTE — Progress Notes (Signed)
Evan [name]   DOB:Mar 04, 1941   WN#:[id number]    Subjective: He feels well. Denies recent fevers or chills. He denies chest pain or shortness of breath. He has trace lower extremity edema. He is ready to go home.  Objective:  Filed Vitals:   05/10/15 0516  BP: 159/73  Pulse: 89  Temp: 98.5 F (36.9 C)  Resp: 20     Intake/Output Summary (Last 24 hours) at 05/10/15 0946 Last data filed at 05/10/15 0845  Gross per 24 hour  Intake   1060 ml  Output    800 ml  Net    260 ml    GENERAL:alert, no distress and comfortable SKIN: skin color, texture, turgor are normal, no rashes or significant lesions EYES: normal, Conjunctiva are pink and non-injected, sclera clear OROPHARYNX:no exudate, no erythema and lips, buccal mucosa, and tongue normal  NECK: supple, thyroid normal size, non-tender, without nodularity LYMPH:  no palpable lymphadenopathy in the cervical, axillary or inguinal LUNGS: clear to auscultation and percussion with normal breathing effort HEART: regular rate & rhythm and no murmurs with mild bilateral lower extremity edema ABDOMEN:abdomen soft, non-tender and normal bowel sounds Musculoskeletal:no cyanosis of digits and no clubbing  NEURO: alert & oriented x 3 with fluent speech, no focal motor/sensory deficits   Labs:  Lab Results  Component Value Date   WBC 24.2* 05/10/2015   HGB 8.9* 05/10/2015   HCT 27.3* 05/10/2015   MCV 101.9* 05/10/2015   PLT 190 05/10/2015   NEUTROABS 22.7* 05/09/2015    Lab Results  Component Value Date   NA 142 05/09/2015   K 3.9 05/09/2015   CL 107 05/09/2015   CO2 25 05/09/2015   Assessment & Plan:  Possible sepsis with leukocytosis The leukocytosis certainly could be related to infection or recent Neulasta injection. His symptoms of fever, chills and weakness has resolved with IV fluid resuscitation and broad-spectrum IV anti-biotics. No source of infection is found so far. If he remained culture-negative today, I think  we can probably switch IV anti-biotics to oral agents such as Augmentin for 5 more days of treatment and be discharged.  Malignant neoplasm of head of pancreas After I reduced the dose of chemotherapy with Abraxane 20% along with reduced dose Gemzar by 50% plus G-CSF support with each cycle, He tolerated that better. Recent repeat CT scan show reduction in the size of cancer His next repeat imaging is in July 2016. I noted that the tumor markers fluctuated up and down. I discussed with the patient and his wife the limitation of tumor marker monitoring.  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 The patient denies recent history of bleeding such as epistaxis, hematuria or hematochezia. [name] I will observe for now. He does not require transfusion now.  Bilateral leg edema This is related to low albumin state. I recommend cutting down on salt ingestion and use elastic compression hose.   Paroxysmal a-fib He has paroxsymal atrial fibrillation recently. EMT documented atrial fibrillation but EKG at the emergency department showed normal sinus rhythm. BNP was mildly elevated in the ER but he has resolved back to normal. He follows with cardiologist. Of note, recent thyroid function tests were within normal limits He will continue medical management and anticoagulation therapy as directed.  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 He has follow-up appointment to see me next month  Will sign off. Call if questions arise    [facility], Rubbie Goostree, MD 05/10/2015  9:46 AM

## 2015-05-12 LAB — CULTURE, BLOOD (ROUTINE X 2)
CULTURE: NO GROWTH
CULTURE: NO GROWTH

## 2015-05-13 ENCOUNTER — Other Ambulatory Visit: Payer: Self-pay | Admitting: *Deleted

## 2015-05-13 NOTE — Patient Outreach (Signed)
Late entry:  This RN CM received a return phone call  On 6/24 after hours from pt's spouse related to f/u with pt on transition of care.  Spouse states in voice message pt just discharged from [facility] today 6/24, they are scheduled to go to the beach tomorrow for a week until next Saturday (05/18/15).   Spouse states feel free to call her back, cell number provided. Spouse states if pt feeling up to it, will not be in town.     RN CM plans to call spouse's cell phone number 6/28 to see if pt is in town, check on status.     Zara Chess.   [place] Care Management  [phone]

## 2015-05-14 ENCOUNTER — Other Ambulatory Visit: Payer: Self-pay | Admitting: *Deleted

## 2015-05-14 NOTE — Patient Outreach (Signed)
Attempt made to contact pt as part of transition of care.  Note- received a call from spouse on 6/24 after hours in response to voice message left by RN CM  that they (pt and spouse) scheduled to go to the beach 6/25 and return 6/2, will go if pt feels up to it.   Call today was to see if pt went to the beach.    HIPPA compliant voice message left with contact number.     Zara Chess.   [place] Care Management  [phone]

## 2015-05-14 NOTE — Patient Outreach (Signed)
Received a return phone call from spouse Srikar Chiang (on [facility] consent form), HIPPA verified on pt.  Spouse states they (pt and spouse) are at the beach in [place], will return on 7/2.  RN CM discussed f/u with pt when return home.  Spouse states pt starts chemo 7/4, not a good day to call, so RN CM to call 7/5.      Zara Chess.   [place] Care Management  [phone]

## 2015-05-14 NOTE — Patient Outreach (Signed)
Follow up call:  Called spouse [name] (on [facility] consent form)back, to let her know when scheduling next transition of care call,  pt's schedule showed to have  chemo on 7/5 (7/4 a holiday), so RN CM will be calling pt on 7/6 to which spouse agreed.    Plan to f/u with pt telephonically on 7/6 as part of ongoing transition of care.   Zara Chess.   [place] Care Management  [phone]

## 2015-05-14 NOTE — Patient Outreach (Signed)
Transition of care call:  Called spouse back as this RN CM was informed can f/u with pt while on vacation in [place]   Spouse states pt is doing good, not been running a fever, checking.  Spouse states they  think fever (reason for hospitalization) came from shot received after chemo.  Spouse states pt is staying in his room, weak.  Spouse states pt is sleeping, eating good.  Spouse states pt is scheduled to see Dr. Alvy Bimler 7/4, to have chemo.  Spouse states called Dr. Delene Ruffini office to scheduled f/u appointment as was told, received a return call from MD's nurse- told MD did not need to see pt- hospitalization was chemo related, pt has appointment in September, call if any medical problems.  Spouse states Dr. Laurann [place] sees pt for his diabetes, blood sugar today was 108, last A1C was 6.1 Discussed with spouse plan to do weekly phone calls as part of transition of care, next call 7/5.    Zara Chess.   [place] Care Management  [phone]

## 2015-05-21 ENCOUNTER — Ambulatory Visit: Payer: Medicare Other

## 2015-05-21 ENCOUNTER — Ambulatory Visit: Payer: Medicare Other | Admitting: *Deleted

## 2015-05-21 ENCOUNTER — Other Ambulatory Visit (HOSPITAL_BASED_OUTPATIENT_CLINIC_OR_DEPARTMENT_OTHER): Payer: Medicare Other

## 2015-05-21 ENCOUNTER — Ambulatory Visit (HOSPITAL_BASED_OUTPATIENT_CLINIC_OR_DEPARTMENT_OTHER): Payer: Medicare Other

## 2015-05-21 VITALS — BP 154/63 | HR 82 | Temp 98.4°F | Resp 18

## 2015-05-21 DIAGNOSIS — C250 Malignant neoplasm of head of pancreas: Secondary | ICD-10-CM | POA: Diagnosis present

## 2015-05-21 DIAGNOSIS — Z5111 Encounter for antineoplastic chemotherapy: Secondary | ICD-10-CM

## 2015-05-21 DIAGNOSIS — Z95828 Presence of other vascular implants and grafts: Secondary | ICD-10-CM

## 2015-05-21 LAB — CBC WITH DIFFERENTIAL/PLATELET
BASO%: 1.3 % (ref 0.0–2.0)
Basophils Absolute: 0.2 10*3/uL — ABNORMAL HIGH (ref 0.0–0.1)
EOS%: 1.2 % (ref 0.0–7.0)
Eosinophils Absolute: 0.2 10*3/uL (ref 0.0–0.5)
HCT: 31 % — ABNORMAL LOW (ref 38.4–49.9)
HGB: 10 g/dL — ABNORMAL LOW (ref 13.0–17.1)
LYMPH%: 11.2 % — ABNORMAL LOW (ref 14.0–49.0)
MCH: 31.9 pg (ref 27.2–33.4)
MCHC: 32.3 g/dL (ref 32.0–36.0)
MCV: 98.9 fL — AB (ref 79.3–98.0)
MONO#: 1 10*3/uL — ABNORMAL HIGH (ref 0.1–0.9)
MONO%: 8 % (ref 0.0–14.0)
NEUT#: 10.1 10*3/uL — ABNORMAL HIGH (ref 1.5–6.5)
NEUT%: 78.3 % — ABNORMAL HIGH (ref 39.0–75.0)
Platelets: 274 10*3/uL (ref 140–400)
RBC: 3.14 10*6/uL — AB (ref 4.20–5.82)
RDW: 18.2 % — AB (ref 11.0–14.6)
WBC: 12.9 10*3/uL — AB (ref 4.0–10.3)
lymph#: 1.4 10*3/uL (ref 0.9–3.3)

## 2015-05-21 LAB — COMPREHENSIVE METABOLIC PANEL (CC13)
ALBUMIN: 3.1 g/dL — AB (ref 3.5–5.0)
ALK PHOS: 297 U/L — AB (ref 40–150)
ALT: 11 U/L (ref 0–55)
AST: 25 U/L (ref 5–34)
Anion Gap: 11 mEq/L (ref 3–11)
BILIRUBIN TOTAL: 0.43 mg/dL (ref 0.20–1.20)
BUN: 14.1 mg/dL (ref 7.0–26.0)
CO2: 23 mEq/L (ref 22–29)
Calcium: 9.7 mg/dL (ref 8.4–10.4)
Chloride: 107 mEq/L (ref 98–109)
Creatinine: 1 mg/dL (ref 0.7–1.3)
EGFR: 71 mL/min/{1.73_m2} — AB (ref >90)
GLUCOSE: 158 mg/dL — AB (ref 70–140)
Potassium: 3.8 mEq/L (ref 3.5–5.1)
Sodium: 142 mEq/L (ref 136–145)
Total Protein: 6.1 g/dL — ABNORMAL LOW (ref 6.4–8.3)

## 2015-05-21 LAB — CANCER ANTIGEN 19-9: CA 19-9: 2260.5 U/mL — ABNORMAL HIGH (ref <35.0)

## 2015-05-21 MED ORDER — SODIUM CHLORIDE 0.9 % IV SOLN
Freq: Once | INTRAVENOUS | Status: AC
  Administered 2015-05-21: 10:00:00 via INTRAVENOUS
  Filled 2015-05-21: qty 4

## 2015-05-21 MED ORDER — SODIUM CHLORIDE 0.9 % IJ SOLN
10.0000 mL | INTRAMUSCULAR | Status: DC | PRN
  Administered 2015-05-21: 10 mL
  Filled 2015-05-21: qty 10

## 2015-05-21 MED ORDER — SODIUM CHLORIDE 0.9 % IV SOLN
Freq: Once | INTRAVENOUS | Status: AC
  Administered 2015-05-21: 10:00:00 via INTRAVENOUS

## 2015-05-21 MED ORDER — PACLITAXEL PROTEIN-BOUND CHEMO INJECTION 100 MG
80.0000 mg/m2 | Freq: Once | INTRAVENOUS | Status: AC
  Administered 2015-05-21: 175 mg via INTRAVENOUS
  Filled 2015-05-21: qty 35

## 2015-05-21 MED ORDER — SODIUM CHLORIDE 0.9 % IV SOLN
500.0000 mg/m2 | Freq: Once | INTRAVENOUS | Status: AC
  Administered 2015-05-21: 1140 mg via INTRAVENOUS
  Filled 2015-05-21: qty 29.98

## 2015-05-21 MED ORDER — HEPARIN SOD (PORK) LOCK FLUSH 100 UNIT/ML IV SOLN
500.0000 [IU] | Freq: Once | INTRAVENOUS | Status: AC | PRN
Start: 2015-05-21 — End: 2015-05-21
  Administered 2015-05-21: 500 [IU]
  Filled 2015-05-21: qty 5

## 2015-05-21 MED ORDER — SODIUM CHLORIDE 0.9 % IJ SOLN
10.0000 mL | INTRAMUSCULAR | Status: DC | PRN
  Administered 2015-05-21: 10 mL via INTRAVENOUS
  Filled 2015-05-21: qty 10

## 2015-05-21 NOTE — Progress Notes (Signed)
Port a cath accessed prior to coming to the infusion room.

## 2015-05-21 NOTE — Patient Instructions (Signed)

## 2015-05-21 NOTE — Patient Instructions (Signed)
[facility] Discharge Instructions for Patients Receiving Chemotherapy  Today you received the following chemotherapy agents:  Abraxane and Gemzar.  To help prevent nausea and vomiting after your treatment, we encourage you to take your nausea medication as prescribed.   If you develop nausea and vomiting that is not controlled by your nausea medication, call the clinic.   BELOW ARE SYMPTOMS THAT SHOULD BE REPORTED IMMEDIATELY:  *FEVER GREATER THAN 100.5 F  *CHILLS WITH OR WITHOUT FEVER  NAUSEA AND VOMITING THAT IS NOT CONTROLLED WITH YOUR NAUSEA MEDICATION  *UNUSUAL SHORTNESS OF BREATH  *UNUSUAL BRUISING OR BLEEDING  TENDERNESS IN MOUTH AND THROAT WITH OR WITHOUT PRESENCE OF ULCERS  *URINARY PROBLEMS  *BOWEL PROBLEMS  UNUSUAL RASH Items with * indicate a potential emergency and should be followed up as soon as possible.  Feel free to call the clinic you have any questions or concerns. The clinic phone number is [phone].  Please show the CHEMO ALERT CARD at check-in to the Emergency Department and triage nurse.   

## 2015-05-22 ENCOUNTER — Telehealth: Payer: Self-pay | Admitting: *Deleted

## 2015-05-22 ENCOUNTER — Other Ambulatory Visit: Payer: Self-pay | Admitting: *Deleted

## 2015-05-22 ENCOUNTER — Other Ambulatory Visit: Payer: Medicare Other | Admitting: *Deleted

## 2015-05-22 NOTE — Patient Outreach (Signed)
Transition of care call (week 2):  Spoke with spouse Evan [name] (on [facility] consent form) who reports pt is doing good.  Spouse reports pt went to chemo yesterday and usually by Wednesday is down but today feels good, going to church tonight.  Spouse reports  pt finished antibiotics,no recurrent fevers, appetite good, had 2 sandwiches last night.  Spouse states pt has chemo  On 7/12,  [place] 7/13 for a shot, 7/22 for CT Scan, 8/1- chemo ago.     Plan to f/u again telephonically 7/14 as part of ongoing transition of care.     Zara Chess.   [place] Care Management  [phone]

## 2015-05-22 NOTE — Telephone Encounter (Signed)
Patient spouse called requesting to talk with Dr. Alvy Bimler or her nurse about CT scans scheduled for 06-07-2015.  "Have as not received an instruction letter but told it is okay for him to take the metformin.  Then told to stop Metformin for 48 hours after the procedure.  Has the procedure changed?  What should he do?"  Instructed that the Glucophage is passed through kidneys and need to ensure kidneys are functioning at optimal level after scans.  It is optimal to stop before CT scans but imperative to stop 48 hours after scans.  Blood sugar range = 100 to 150.  Reports she will hold this the day before and for two days after scans.  [facility] number given to call for instructions if she doesn't receive these in the mail by 05-30-2015.

## 2015-05-22 NOTE — Patient Outreach (Signed)
Attempt made to contact pt's spouse (on [facility] consent form) as  missed return call from her to  voice message left earlier today by RN CM.   HIPPA compliant voice message left.    Zara Chess.   [place] Care Management  [phone]

## 2015-05-22 NOTE — Patient Outreach (Signed)
Attempt made to contact pt as part of transition of care (week 2):  HIPPA compliant voice message left with contact number.  If no response, will try again.       Evan [name].   [place] Care Management  [phone]

## 2015-05-28 ENCOUNTER — Ambulatory Visit (HOSPITAL_BASED_OUTPATIENT_CLINIC_OR_DEPARTMENT_OTHER): Payer: Medicare Other

## 2015-05-28 ENCOUNTER — Other Ambulatory Visit: Payer: Self-pay | Admitting: Hematology and Oncology

## 2015-05-28 ENCOUNTER — Ambulatory Visit: Payer: Medicare Other

## 2015-05-28 ENCOUNTER — Other Ambulatory Visit (HOSPITAL_BASED_OUTPATIENT_CLINIC_OR_DEPARTMENT_OTHER): Payer: Medicare Other

## 2015-05-28 VITALS — BP 154/62 | HR 83 | Temp 98.6°F | Resp 18

## 2015-05-28 DIAGNOSIS — Z95828 Presence of other vascular implants and grafts: Secondary | ICD-10-CM

## 2015-05-28 DIAGNOSIS — C250 Malignant neoplasm of head of pancreas: Secondary | ICD-10-CM | POA: Diagnosis present

## 2015-05-28 DIAGNOSIS — Z5111 Encounter for antineoplastic chemotherapy: Secondary | ICD-10-CM | POA: Diagnosis present

## 2015-05-28 LAB — CBC WITH DIFFERENTIAL/PLATELET
BASO%: 1.1 % (ref 0.0–2.0)
BASOS ABS: 0 10*3/uL (ref 0.0–0.1)
EOS%: 1.1 % (ref 0.0–7.0)
Eosinophils Absolute: 0 10*3/uL (ref 0.0–0.5)
HEMATOCRIT: 27.9 % — AB (ref 38.4–49.9)
HGB: 9.2 g/dL — ABNORMAL LOW (ref 13.0–17.1)
LYMPH#: 0.8 10*3/uL — AB (ref 0.9–3.3)
LYMPH%: 18.8 % (ref 14.0–49.0)
MCH: 32.2 pg (ref 27.2–33.4)
MCHC: 32.9 g/dL (ref 32.0–36.0)
MCV: 97.8 fL (ref 79.3–98.0)
MONO#: 0.6 10*3/uL (ref 0.1–0.9)
MONO%: 13.9 % (ref 0.0–14.0)
NEUT%: 65.1 % (ref 39.0–75.0)
NEUTROS ABS: 2.8 10*3/uL (ref 1.5–6.5)
PLATELETS: 164 10*3/uL (ref 140–400)
RBC: 2.86 10*6/uL — ABNORMAL LOW (ref 4.20–5.82)
RDW: 17.2 % — ABNORMAL HIGH (ref 11.0–14.6)
WBC: 4.3 10*3/uL (ref 4.0–10.3)

## 2015-05-28 LAB — COMPREHENSIVE METABOLIC PANEL (CC13)
ALK PHOS: 316 U/L — AB (ref 40–150)
ALT: 13 U/L (ref 0–55)
AST: 22 U/L (ref 5–34)
Albumin: 3.1 g/dL — ABNORMAL LOW (ref 3.5–5.0)
Anion Gap: 10 mEq/L (ref 3–11)
BUN: 16.4 mg/dL (ref 7.0–26.0)
CO2: 24 meq/L (ref 22–29)
CREATININE: 1.1 mg/dL (ref 0.7–1.3)
Calcium: 10.2 mg/dL (ref 8.4–10.4)
Chloride: 107 mEq/L (ref 98–109)
EGFR: 64 mL/min/{1.73_m2} — AB (ref >90)
Glucose: 146 mg/dl — ABNORMAL HIGH (ref 70–140)
Potassium: 4.4 mEq/L (ref 3.5–5.1)
Sodium: 142 mEq/L (ref 136–145)
TOTAL PROTEIN: 6.3 g/dL — AB (ref 6.4–8.3)
Total Bilirubin: 0.41 mg/dL (ref 0.20–1.20)

## 2015-05-28 MED ORDER — SODIUM CHLORIDE 0.9 % IV SOLN
Freq: Once | INTRAVENOUS | Status: AC
  Administered 2015-05-28: 10:00:00 via INTRAVENOUS

## 2015-05-28 MED ORDER — SODIUM CHLORIDE 0.9 % IJ SOLN
10.0000 mL | INTRAMUSCULAR | Status: DC | PRN
  Administered 2015-05-28: 10 mL via INTRAVENOUS
  Filled 2015-05-28: qty 10

## 2015-05-28 MED ORDER — SODIUM CHLORIDE 0.9 % IV SOLN
500.0000 mg/m2 | Freq: Once | INTRAVENOUS | Status: AC
  Administered 2015-05-28: 1140 mg via INTRAVENOUS
  Filled 2015-05-28: qty 29.98

## 2015-05-28 MED ORDER — HEPARIN SOD (PORK) LOCK FLUSH 100 UNIT/ML IV SOLN
500.0000 [IU] | Freq: Once | INTRAVENOUS | Status: AC | PRN
  Administered 2015-05-28: 500 [IU]
  Filled 2015-05-28: qty 5

## 2015-05-28 MED ORDER — SODIUM CHLORIDE 0.9 % IJ SOLN
10.0000 mL | INTRAMUSCULAR | Status: DC | PRN
  Administered 2015-05-28: 10 mL
  Filled 2015-05-28: qty 10

## 2015-05-28 MED ORDER — PACLITAXEL PROTEIN-BOUND CHEMO INJECTION 100 MG
80.0000 mg/m2 | Freq: Once | INTRAVENOUS | Status: AC
  Administered 2015-05-28: 175 mg via INTRAVENOUS
  Filled 2015-05-28: qty 35

## 2015-05-28 MED ORDER — SODIUM CHLORIDE 0.9 % IV SOLN
Freq: Once | INTRAVENOUS | Status: AC
  Administered 2015-05-28: 10:00:00 via INTRAVENOUS
  Filled 2015-05-28: qty 4

## 2015-05-28 NOTE — Progress Notes (Signed)
Ok to treat per MD Alvy Bimler with alka phos:[phone]

## 2015-05-28 NOTE — Patient Instructions (Signed)

## 2015-05-28 NOTE — Patient Instructions (Signed)
[facility] Discharge Instructions for Patients Receiving Chemotherapy  Today you received the following chemotherapy agents Abraxane/Gemzar.   To help prevent nausea and vomiting after your treatment, we encourage you to take your nausea medication as directed.    If you develop nausea and vomiting that is not controlled by your nausea medication, call the clinic.   BELOW ARE SYMPTOMS THAT SHOULD BE REPORTED IMMEDIATELY:  *FEVER GREATER THAN 100.5 F  *CHILLS WITH OR WITHOUT FEVER  NAUSEA AND VOMITING THAT IS NOT CONTROLLED WITH YOUR NAUSEA MEDICATION  *UNUSUAL SHORTNESS OF BREATH  *UNUSUAL BRUISING OR BLEEDING  TENDERNESS IN MOUTH AND THROAT WITH OR WITHOUT PRESENCE OF ULCERS  *URINARY PROBLEMS  *BOWEL PROBLEMS  UNUSUAL RASH Items with * indicate a potential emergency and should be followed up as soon as possible.  Feel free to call the clinic you have any questions or concerns. The clinic phone number is [phone].  Please show the [place] at check-in to the Emergency Department and triage nurse.

## 2015-05-29 ENCOUNTER — Ambulatory Visit (HOSPITAL_BASED_OUTPATIENT_CLINIC_OR_DEPARTMENT_OTHER): Payer: Medicare Other

## 2015-05-29 VITALS — BP 138/70 | HR 108 | Temp 98.4°F

## 2015-05-29 DIAGNOSIS — Z5189 Encounter for other specified aftercare: Secondary | ICD-10-CM | POA: Diagnosis not present

## 2015-05-29 DIAGNOSIS — C250 Malignant neoplasm of head of pancreas: Secondary | ICD-10-CM | POA: Diagnosis present

## 2015-05-29 MED ORDER — PEGFILGRASTIM INJECTION 6 MG/0.6ML ~~LOC~~
6.0000 mg | PREFILLED_SYRINGE | Freq: Once | SUBCUTANEOUS | Status: AC
  Administered 2015-05-29: 6 mg via SUBCUTANEOUS
  Filled 2015-05-29: qty 0.6

## 2015-05-30 ENCOUNTER — Other Ambulatory Visit: Payer: Self-pay | Admitting: *Deleted

## 2015-05-30 NOTE — Patient Outreach (Signed)
Transition of care call (week 3):  Spoke with spouse [name] (on [facility] consent form) who states pt is doing good.  Spouse states pt had chemo 7/11, shot (Neulasta) yesterday and then went to church last night.  Spouse states pt was able to help her yesterday make the bed. Spouse states pt uses a cane when  he goes out but does everything for himself.   Spouse states pt has not had any fevers, appetite good. Spouse states pt's sugars run 100-120, does not check his BP but was good at last MD visit.   In fact, spouse states pt's BP med was cut in half since he lost weight (currently 222 lbs).  RN CM discussed with spouse plan to f/u again telephonically 7/21 - part of ongoing transition of care.  Zara Chess.   [place] Care Management  [phone]

## 2015-06-06 ENCOUNTER — Ambulatory Visit: Payer: Medicare Other | Admitting: *Deleted

## 2015-06-06 ENCOUNTER — Other Ambulatory Visit: Payer: Self-pay | Admitting: *Deleted

## 2015-06-06 NOTE — Patient Outreach (Signed)
Transition of care call (week 4):  Spoke with spouse [name] (on [facility] consent form) who states pt had a rough week, started Sunday, woke up sweating, no fever.  Spouse states pt's temperature was low 98, have been giving him Tylenol during the day.   Spouse states pt also had diarrhea on Monday 7/18.  Spouse states pt was okay yesterday and today, went to church last night.  Spouse states pt is to have CT Scan tomorrow, get results 7/26 and have chemo that day. Spouse states pt's blood sugar was down to 70 on Sunday, gave him some Gatorade and water.  RN CM discussed with spouse sick days for a diabetic (stay hydrated with juices).   RN CM to do final transition of care call 7/26, discussed doing a home visit thereafter to which spouse states wants to wait to see results of CT scan first.    Plan to f/u again 7/26 as part of ongoing transition of care.   Zara Chess.   [place] Care Management  [phone]

## 2015-06-07 ENCOUNTER — Ambulatory Visit (HOSPITAL_COMMUNITY)
Admission: RE | Admit: 2015-06-07 | Discharge: 2015-06-07 | Disposition: A | Discharge status: Home / Self Care | Payer: Medicare Other | Source: Ambulatory Visit | Attending: Hematology and Oncology | Admitting: Hematology and Oncology

## 2015-06-07 ENCOUNTER — Ambulatory Visit (HOSPITAL_COMMUNITY): Payer: Medicare Other

## 2015-06-07 DIAGNOSIS — C250 Malignant neoplasm of head of pancreas: Secondary | ICD-10-CM

## 2015-06-07 DIAGNOSIS — C259 Malignant neoplasm of pancreas, unspecified: Secondary | ICD-10-CM | POA: Diagnosis present

## 2015-06-07 MED ORDER — IOHEXOL 300 MG/ML  SOLN
100.0000 mL | Freq: Once | INTRAMUSCULAR | Status: AC | PRN
  Administered 2015-06-07: 100 mL via INTRAVENOUS

## 2015-06-10 ENCOUNTER — Ambulatory Visit (HOSPITAL_BASED_OUTPATIENT_CLINIC_OR_DEPARTMENT_OTHER): Payer: Medicare Other | Admitting: Hematology and Oncology

## 2015-06-10 ENCOUNTER — Encounter: Payer: Self-pay | Admitting: Hematology and Oncology

## 2015-06-10 ENCOUNTER — Other Ambulatory Visit (HOSPITAL_BASED_OUTPATIENT_CLINIC_OR_DEPARTMENT_OTHER): Payer: Medicare Other

## 2015-06-10 ENCOUNTER — Ambulatory Visit: Payer: Medicare Other

## 2015-06-10 ENCOUNTER — Telehealth: Payer: Self-pay | Admitting: *Deleted

## 2015-06-10 ENCOUNTER — Ambulatory Visit (HOSPITAL_BASED_OUTPATIENT_CLINIC_OR_DEPARTMENT_OTHER): Payer: Medicare Other

## 2015-06-10 VITALS — BP 147/62 | HR 80 | Temp 98.1°F | Resp 18 | Ht 74.0 in | Wt 220.5 lb

## 2015-06-10 DIAGNOSIS — C250 Malignant neoplasm of head of pancreas: Secondary | ICD-10-CM

## 2015-06-10 DIAGNOSIS — C9210 Chronic myeloid leukemia, BCR/ABL-positive, not having achieved remission: Secondary | ICD-10-CM | POA: Diagnosis not present

## 2015-06-10 DIAGNOSIS — D630 Anemia in neoplastic disease: Secondary | ICD-10-CM | POA: Diagnosis not present

## 2015-06-10 DIAGNOSIS — Z95828 Presence of other vascular implants and grafts: Secondary | ICD-10-CM

## 2015-06-10 LAB — CBC WITH DIFFERENTIAL/PLATELET
BASO%: 0.9 % (ref 0.0–2.0)
Basophils Absolute: 0.2 10*3/uL — ABNORMAL HIGH (ref 0.0–0.1)
EOS%: 1.2 % (ref 0.0–7.0)
Eosinophils Absolute: 0.2 10*3/uL (ref 0.0–0.5)
HCT: 29.3 % — ABNORMAL LOW (ref 38.4–49.9)
HEMOGLOBIN: 9.5 g/dL — AB (ref 13.0–17.1)
LYMPH%: 5.9 % — ABNORMAL LOW (ref 14.0–49.0)
MCH: 32.2 pg (ref 27.2–33.4)
MCHC: 32.5 g/dL (ref 32.0–36.0)
MCV: 99.1 fL — ABNORMAL HIGH (ref 79.3–98.0)
MONO#: 1 10*3/uL — ABNORMAL HIGH (ref 0.1–0.9)
MONO%: 5.8 % (ref 0.0–14.0)
NEUT%: 86.2 % — ABNORMAL HIGH (ref 39.0–75.0)
NEUTROS ABS: 14.8 10*3/uL — AB (ref 1.5–6.5)
Platelets: 240 10*3/uL (ref 140–400)
RBC: 2.95 10*6/uL — ABNORMAL LOW (ref 4.20–5.82)
RDW: 18.8 % — AB (ref 11.0–14.6)
WBC: 17.2 10*3/uL — ABNORMAL HIGH (ref 4.0–10.3)
lymph#: 1 10*3/uL (ref 0.9–3.3)

## 2015-06-10 LAB — COMPREHENSIVE METABOLIC PANEL (CC13)
ALT: 13 U/L (ref 0–55)
AST: 25 U/L (ref 5–34)
Albumin: 3.1 g/dL — ABNORMAL LOW (ref 3.5–5.0)
Alkaline Phosphatase: 342 U/L — ABNORMAL HIGH (ref 40–150)
Anion Gap: 9 mEq/L (ref 3–11)
BUN: 13.9 mg/dL (ref 7.0–26.0)
CALCIUM: 9.3 mg/dL (ref 8.4–10.4)
CO2: 26 meq/L (ref 22–29)
Chloride: 107 mEq/L (ref 98–109)
Creatinine: 1.3 mg/dL (ref 0.7–1.3)
EGFR: 55 mL/min/{1.73_m2} — ABNORMAL LOW (ref >90)
Glucose: 162 mg/dl — ABNORMAL HIGH (ref 70–140)
Potassium: 4.2 mEq/L (ref 3.5–5.1)
SODIUM: 143 meq/L (ref 136–145)
Total Bilirubin: 0.35 mg/dL (ref 0.20–1.20)
Total Protein: 6 g/dL — ABNORMAL LOW (ref 6.4–8.3)

## 2015-06-10 MED ORDER — SODIUM CHLORIDE 0.9 % IJ SOLN
10.0000 mL | INTRAMUSCULAR | Status: DC | PRN
  Administered 2015-06-10: 10 mL via INTRAVENOUS
  Filled 2015-06-10: qty 10

## 2015-06-10 NOTE — Assessment & Plan Note (Signed)
The patient continued to be on Sprycel. His CBC 2 weeks ago show normal white blood cell count with mild anemia. In the scope of things, I would not recheck his BCR/ABL. I recommend he continues treatment until he is ready to accept comfort care only.

## 2015-06-10 NOTE — Assessment & Plan Note (Signed)
We reviewed the imaging study. The CT scan and tumor markers show disease progression. We reviewed the national guidelines. The only treatment available in this situation would nanoliposomal irinotecan. I reviewed with him information from recent clinical study from [facility]-1 trial The only meaningful benefit is increase overall survival by 2 months, at the expense of significant side effects. I recommend discontinuation of treatment, referral to palliative care and hospice. He agreed with the plan of care.

## 2015-06-10 NOTE — Progress Notes (Signed)
[place] OFFICE PROGRESS NOTE  Patient Care Team: Lavone Orn, MD as PCP - General (Internal Medicine) Evan Comings, MD as Referring Physician (Specialist) Provider Not In System [name], MD as Consulting Physician (Hematology and Oncology) Lyman Speller, RN as [place] Management  SUMMARY OF ONCOLOGIC HISTORY: Oncology History   Malignant neoplasm of head of pancreas   Staging form: Pancreas, AJCC 7th Edition     Clinical: Stage IV (T3, N0, M1) - Signed by [name], MD on 06/10/2015         Malignant neoplasm of head of pancreas   06/05/2014 Imaging  MRI abdomen showed 2.2 cm lesion in the pancreatic head obstructing the common bile duct and the pancreatic duct is highly concerning for pancreatic adenocarcinoma.  There is moderate intra and extrahepatic biliary duct dilatation.    06/05/2014 Tumor Marker CA 19-9 is elevated at 1234   06/06/2014 Imaging Ct scan of chest showed 2 lung nodules, likely benign   06/07/2014 Procedure He had ERCP and stent placement   06/07/2014 Procedure He had endoscopic ultrasound with  fine needle aspiration.   06/07/2014 Pathology Results Accession: [id number] FNA is positive for pancreatic adenoca   06/14/2014 Surgery He has port placement   06/25/2014 - 08/01/2014 Chemotherapy He received neoadjuvant chemotherapy with Gemzar   06/25/2014 - 08/06/2014 Radiation Therapy he received neoadjuvant radiation: 50.4 Gy   08/13/2014 - 08/23/2014 Hospital Admission The patient was admitted to the hospital related to uncontrolled nausea, vomiting and severe epigastric pain and was found to have significant gastric ulcer, resolved with conservative management and total parenteral nutrition   09/06/2014 Imaging Repeat CT scan show enlarging lung nodule, suspicious for early metastatic disease.   10/18/2014 Imaging CT scan of the chest, abdomen and pelvis show metastatic disease in the lung and liver.   10/18/2014 Tumor Marker CA-19-9  at 1745   10/22/2014 - 12/03/2014 Chemotherapy The patient is placed on modified FOLFOX chemotherapy   11/05/2014 Tumor Marker Ca19-9 at 2447   12/14/2014 Imaging CT scan showed disease progression in the liver   12/14/2014 Tumor Marker CA 19-9 at 2050   12/17/2014 -  Chemotherapy the patient was starting on Abraxane with reduced dose Gemzar   12/24/2014 Tumor Marker CA-19-9 is at 1387.   12/26/2014 - 12/29/2014 Hospital Admission the patient was admitted to the hospital for hospital-acquired pneumonia.   01/07/2015 Adverse Reaction cycle 2 of treatment with reduced dose Abraxane due to recent hospitalization   01/12/2015 - 01/16/2015 Hospital Admission  the patient was admitted to the hospital again for hospital acquired pneumonia.   03/04/2015 Imaging Repeat CT scans show improvement of disease control.   06/07/2015 Imaging repeat CT scan showed disease progression     INTERVAL HISTORY: Please see below for problem oriented charting. He returns for further follow-up. He complained of fatigue. Denies any chest pain or shortness of breath  REVIEW OF SYSTEMS:   Constitutional: Denies fevers, chills or abnormal weight loss Eyes: Denies blurriness of vision Ears, nose, mouth, throat, and face: Denies mucositis or sore throat Respiratory: Denies cough, dyspnea or wheezes Cardiovascular: Denies palpitation, chest discomfort or lower extremity swelling Gastrointestinal:  Denies nausea, heartburn or change in bowel habits Skin: Denies abnormal skin rashes Lymphatics: Denies new lymphadenopathy or easy bruising Neurological:Denies numbness, tingling or new weaknesses Behavioral/Psych: Mood is stable, no new changes  All other systems were reviewed with the patient and are negative.  I have reviewed the past medical history, past surgical  history, social history and family history with the patient and they are unchanged from previous note.  ALLERGIES:  is allergic to ace inhibitors and  ciprofloxacin.  MEDICATIONS:  Current Outpatient Prescriptions  Medication Sig Dispense Refill  . acetaminophen (TYLENOL) 500 MG tablet Take 1,000 mg by mouth every 6 (six) hours as needed for fever.    . cholecalciferol (VITAMIN D) 1000 UNITS tablet Take 1,000 Units by mouth daily.    . dasatinib (SPRYCEL) 50 MG tablet Take 1 tablet (50 mg total) by mouth daily. 30 tablet 2  . diltiazem (CARDIZEM CD) 120 MG 24 hr capsule Take 1 capsule (120 mg total) by mouth daily. 30 capsule 3  . Docusate Calcium (STOOL SOFTENER PO) Take 2 tablets by mouth as needed. For constipation    . insulin glargine (LANTUS) 100 UNIT/ML injection Inject 18 Units into the skin at bedtime.     Marland Kitchen lactulose (CHRONULAC) 10 GM/15ML solution Take 10 g by mouth 2 (two) times daily as needed for moderate constipation.     . lidocaine-prilocaine (EMLA) cream Apply 1 application topically as needed. Before chemo    . losartan (COZAAR) 50 MG tablet Take 50 mg by mouth daily.  11  . magnesium oxide (MAG-OX) 400 MG tablet Take 1 tablet (400 mg total) by mouth daily. 30 tablet 3  . metFORMIN (GLUCOPHAGE) 1000 MG tablet Take 1,000 mg by mouth 2 (two) times daily with a meal.    . omeprazole (PRILOSEC) 20 MG capsule Take 20 mg by mouth as needed. For indigestion at night    . pantoprazole (PROTONIX) 40 MG tablet Take 1 tablet (40 mg total) by mouth daily. 60 tablet 6  . Rivaroxaban (XARELTO) 15 MG TABS tablet Take 1 tablet (15 mg total) by mouth daily with supper. 30 tablet 3  . sennosides-docusate sodium (SENOKOT-S) 8.6-50 MG tablet Take 2 tablets by mouth at bedtime.     . simvastatin (ZOCOR) 20 MG tablet Take 20 mg by mouth daily.     Marland Kitchen HYDROmorphone (DILAUDID) 4 MG tablet Take 4 mg by mouth every 4 (four) hours as needed for severe pain.    Marland Kitchen morphine (MSIR) 30 MG tablet Take 30 mg by mouth every 4 (four) hours as needed for severe pain. At bedtime for pain    . ondansetron (ZOFRAN) 8 MG tablet Take by mouth every 8 (eight) hours  as needed for nausea or vomiting.    . promethazine (PHENERGAN) 25 MG tablet Take 25 mg by mouth every 6 (six) hours as needed for nausea or vomiting (nausea).    . traMADol (ULTRAM) 50 MG tablet Take 1 tablet (50 mg total) by mouth every 6 (six) hours as needed. (Patient not taking: Reported on 05/14/2015) 60 tablet 0   No current facility-administered medications for this visit.    PHYSICAL EXAMINATION: ECOG PERFORMANCE STATUS: 1 - Symptomatic but completely ambulatory  Filed Vitals:   06/10/15 1047  BP: 147/62  Pulse: 80  Temp: 98.1 F (36.7 C)  Resp: 18   Filed Weights   06/10/15 1047  Weight: 220 lb 8 oz (100.018 kg)    GENERAL:alert, no distress and comfortable SKIN: skin color, texture, turgor are normal, no rashes or significant lesions EYES: normal, Conjunctiva are pink and non-injected, sclera clear Musculoskeletal:no cyanosis of digits and no clubbing  NEURO: alert & oriented x 3 with fluent speech, no focal motor/sensory deficits  LABORATORY DATA:  I have reviewed the data as listed    Component Value Date/Time  NA 143 06/10/2015 0955   NA 142 05/09/2015 0540   K 4.2 06/10/2015 0955   K 3.9 05/09/2015 0540   CL 107 05/09/2015 0540   CL 107 04/27/2013 0926   CO2 26 06/10/2015 0955   CO2 25 05/09/2015 0540   GLUCOSE 162* 06/10/2015 0955   GLUCOSE 81 05/09/2015 0540   GLUCOSE 212* 04/27/2013 0926   BUN 13.9 06/10/2015 0955   BUN 10 05/09/2015 0540   CREATININE 1.3 06/10/2015 0955   CREATININE 1.17 05/09/2015 0540   CALCIUM 9.3 06/10/2015 0955   CALCIUM 9.2 05/09/2015 0540   PROT 6.0* 06/10/2015 0955   PROT 5.7* 05/08/2015 0155   ALBUMIN 3.1* 06/10/2015 0955   ALBUMIN 3.0* 05/08/2015 0155   AST 25 06/10/2015 0955   AST 27 05/08/2015 0155   ALT 13 06/10/2015 0955   ALT 17 05/08/2015 0155   ALKPHOS 342* 06/10/2015 0955   ALKPHOS 282* 05/08/2015 0155   BILITOT 0.35 06/10/2015 0955   BILITOT 0.8 05/08/2015 0155   GFRNONAA >60 05/09/2015 0540   GFRAA  >60 05/09/2015 0540    No results found for: SPEP, UPEP  Lab Results  Component Value Date   WBC 17.2* 06/10/2015   NEUTROABS 14.8* 06/10/2015   HGB 9.5* 06/10/2015   HCT 29.3* 06/10/2015   MCV 99.1* 06/10/2015   PLT 240 06/10/2015      Chemistry      Component Value Date/Time   NA 143 06/10/2015 0955   NA 142 05/09/2015 0540   K 4.2 06/10/2015 0955   K 3.9 05/09/2015 0540   CL 107 05/09/2015 0540   CL 107 04/27/2013 0926   CO2 26 06/10/2015 0955   CO2 25 05/09/2015 0540   BUN 13.9 06/10/2015 0955   BUN 10 05/09/2015 0540   CREATININE 1.3 06/10/2015 0955   CREATININE 1.17 05/09/2015 0540      Component Value Date/Time   CALCIUM 9.3 06/10/2015 0955   CALCIUM 9.2 05/09/2015 0540   ALKPHOS 342* 06/10/2015 0955   ALKPHOS 282* 05/08/2015 0155   AST 25 06/10/2015 0955   AST 27 05/08/2015 0155   ALT 13 06/10/2015 0955   ALT 17 05/08/2015 0155   BILITOT 0.35 06/10/2015 0955   BILITOT 0.8 05/08/2015 0155       RADIOGRAPHIC STUDIES:I reviewed the CT scan with him and his wife I have personally reviewed the radiological images as listed and agreed with the findings in the report.    ASSESSMENT & PLAN:  Malignant neoplasm of head of pancreas We reviewed the imaging study. The CT scan and tumor markers show disease progression. We reviewed the national guidelines. The only treatment available in this situation would nanoliposomal irinotecan. I reviewed with him information from recent clinical study from NAPOLI-1 trial The only meaningful benefit is increase overall survival by 2 months, at the expense of significant side effects. I recommend discontinuation of treatment, referral to palliative care and hospice. He agreed with the plan of care.  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 The patient denies recent history of bleeding such as epistaxis, hematuria or hematochezia. [name] I will observe for now.     Chronic myelogenous leukemia (CML), BCR-ABL1-positive The patient continued to be on Sprycel. His CBC 2 weeks ago show normal white blood cell count with mild anemia. In the scope of things, I would not recheck his BCR/ABL. I recommend he continues treatment until he is ready to accept comfort care only.  No orders of the defined types were placed in this encounter.   All questions were answered. The patient knows to call the clinic with any problems, questions or concerns. No barriers to learning was detected. I spent 30 minutes counseling the patient face to face. The total time spent in the appointment was 40 minutes and more than 50% was on counseling and review of test results     [facility], [place], MD 06/10/2015 12:05 PM

## 2015-06-10 NOTE — Patient Instructions (Signed)

## 2015-06-10 NOTE — Telephone Encounter (Signed)
Referral called to Hospice 

## 2015-06-10 NOTE — Assessment & Plan Note (Signed)
This is likely due to recent treatment. The patient denies recent history of bleeding such as epistaxis, hematuria or hematochezia. [name] I will observe for now.    

## 2015-06-11 ENCOUNTER — Encounter: Payer: Self-pay | Admitting: *Deleted

## 2015-06-11 ENCOUNTER — Other Ambulatory Visit: Payer: Self-pay | Admitting: *Deleted

## 2015-06-11 ENCOUNTER — Ambulatory Visit: Payer: Medicare Other

## 2015-06-11 LAB — CANCER ANTIGEN 19-9: CA 19-9: 3496.3 U/mL — ABNORMAL HIGH (ref <35.0)

## 2015-06-11 NOTE — Patient Outreach (Signed)
Final transition of care call:   Spoke with spouse [name] (on [facility] consent form), reports  Hospice started, nurse already came, SW to come today.      RN CM completed transition of care,goals completed, no further intervention needed as Hospice services has started.   RN CM to send Dr. Laurann Montana closure letter- transition of care completed, no further intervention needed.  RN CM to inform Lattie Haw [facility] CMA to close case.   Zara Chess.   [place] Care Management  [phone]

## 2015-06-12 NOTE — Patient Outreach (Signed)
[facility]) Care Management  06/12/2015  JAWARA LATORRE Mar 29, 1941 [id number]   Notification from Kathie Rhodes, RN to close case due to patient enrolled in [place].  [name]. [place], [place] Management [place] Assistant Phone: [phone] Fax: [phone]

## 2015-06-17 ENCOUNTER — Ambulatory Visit: Payer: Medicare Other

## 2015-06-17 ENCOUNTER — Telehealth: Payer: Self-pay | Admitting: Cardiovascular Disease

## 2015-06-17 ENCOUNTER — Other Ambulatory Visit: Payer: Medicare Other

## 2015-06-17 NOTE — Telephone Encounter (Signed)
New message     Patient wife calling asking is there any reason why he needs to come into his appt 8/30 . Also wants to discuss discontinue medication.    Patient on hospice.

## 2015-06-18 ENCOUNTER — Ambulatory Visit: Payer: Medicare Other

## 2015-06-18 NOTE — Telephone Encounter (Signed)
PT  HAS  STAGE  IV PANCREATIC  CA  PER  WIFE   NOT  SURE  IF CAN MAKE  APPT  IS VERY  WEAK   ALSO   NOT  SURE   IF NEEDS TO CONTINUE  MEDS   PT'S  WIFE   WILL DISCUSS WITH HOSPICE  MD  AND  CALL BACK IF  I  NEED  TO   DISCUSS WITH  DR Johnsie Cancel  WILL AWAIT  RETURN  PHONE  CALL ./CY

## 2015-06-20 ENCOUNTER — Other Ambulatory Visit: Payer: Self-pay | Admitting: Hematology and Oncology

## 2015-06-21 NOTE — Telephone Encounter (Signed)
HOSPICE  MD  WILL FILL  PT'S  MEDS.PER PT'S WIFE  WILL  CANCEL UPCOMING APPT WITH DR Johnsie Cancel  PT  TO WEAK  TO KEEP  WILL LET  DR Johnsie Cancel  KNOW .Adonis Housekeeper

## 2015-06-24 ENCOUNTER — Encounter: Payer: Self-pay | Admitting: Hematology and Oncology

## 2015-06-24 NOTE — Progress Notes (Signed)
I placed fmla forms for Evan [name] and for Evan [name] on desk of nurse for dr [employer]. II called to let the wife know the forms needed to be signed and I will call her when ready and will fax direct if Evan [name] and Evan [name] would like me to and get copies in the mail to them for their records. They would be mailed to the patient's address. I advised her to call me back to let me if they want faxed.

## 2015-06-25 ENCOUNTER — Encounter: Payer: Self-pay | Admitting: Hematology and Oncology

## 2015-06-25 NOTE — Progress Notes (Signed)
I spoke with patient's wife and she will come by and pick up forms for james and cynthia(kids) I faxed the forms for Jeneen Rinks to [phone]

## 2015-06-27 ENCOUNTER — Other Ambulatory Visit: Payer: Self-pay | Admitting: Hematology and Oncology

## 2015-06-28 ENCOUNTER — Encounter: Payer: Self-pay | Admitting: Hematology and Oncology

## 2015-06-28 NOTE — Progress Notes (Signed)
Per [name] ok to mail her copy of fmla form to patient's address.

## 2015-06-28 NOTE — Progress Notes (Signed)
I faxed corrected forms to [place] [phone]. And I emailed [name] her copy for her records per her request

## 2015-07-01 ENCOUNTER — Telehealth: Payer: Self-pay | Admitting: *Deleted

## 2015-07-01 NOTE — Telephone Encounter (Signed)
Hospice RN left VM asking if ok w/ Dr. Alvy Bimler for pt to be DNR?  I called back and left VM informing ok for DNR from Dr. Alvy Bimler,  Please have Hospice Medical Director sign form.

## 2015-07-03 ENCOUNTER — Ambulatory Visit: Payer: Medicare Other | Admitting: Podiatry

## 2015-07-03 ENCOUNTER — Encounter: Payer: Self-pay | Admitting: Podiatry

## 2015-07-03 ENCOUNTER — Ambulatory Visit (INDEPENDENT_AMBULATORY_CARE_PROVIDER_SITE_OTHER): Payer: Medicare Other | Admitting: Podiatry

## 2015-07-03 VITALS — BP 161/85 | HR 102 | Resp 20

## 2015-07-03 DIAGNOSIS — E0840 Diabetes mellitus due to underlying condition with diabetic neuropathy, unspecified: Secondary | ICD-10-CM | POA: Diagnosis not present

## 2015-07-03 DIAGNOSIS — B351 Tinea unguium: Secondary | ICD-10-CM | POA: Diagnosis not present

## 2015-07-03 DIAGNOSIS — M79676 Pain in unspecified toe(s): Secondary | ICD-10-CM | POA: Diagnosis not present

## 2015-07-03 NOTE — Progress Notes (Signed)
Patient ID: Evan [name], male   DOB: 06-Jan-1941, 74 y.o.   MRN: [id number] Complaint:  Visit Type: Patient returns to my office for continued preventative foot care services. Complaint: Patient states" my nails have grown long and thick and become painful to walk and wear shoes" Patient has been diagnosed with DM with neuropathy.. The patient presents for preventative foot care services. No changes to ROS  Podiatric Exam: Vascular: dorsalis pedis and posterior tibial pulses are palpable bilateral. Capillary return is immediate. Temperature gradient is WNL. Skin turgor WNL  Sensorium: Normal Semmes Weinstein monofilament test. Normal tactile sensation bilaterally. Nail Exam: Pt has thick disfigured discolored nails with subungual debris noted bilateral entire nail hallux through fifth toenails Ulcer Exam: There is no evidence of ulcer or pre-ulcerative changes or infection. Orthopedic Exam: Muscle tone and strength are WNL. No limitations in general ROM. No crepitus or effusions noted. Foot type and digits show no abnormalities. Bony prominences are unremarkable. Skin: No Porokeratosis. No infection or ulcers  Diagnosis:  Onychomycosis, , Pain in right toe, pain in left toes  Treatment & Plan Procedures and Treatment: Consent by patient was obtained for treatment procedures. The patient understood the discussion of treatment and procedures well. All questions were answered thoroughly reviewed. Debridement of mycotic and hypertrophic toenails, 1 through 5 bilateral and clearing of subungual debris. No ulceration, no infection noted.  Return Visit-Office Procedure: Patient instructed to return to the office for a follow up visit 3 months for continued evaluation and treatment.

## 2015-07-08 ENCOUNTER — Telehealth: Payer: Self-pay | Admitting: Cardiovascular Disease

## 2015-07-08 NOTE — Telephone Encounter (Signed)
DISCUSSED  MESSAGE  WITH WIFE.  PT'S WIFE  CALLING  RE  TAKING  OF  XARELTO  INFORMED  TO GIVE  MED  TO PT  WHENEVER  HE  IS  EATING    MEAL  WHATEVER TIME  OF DAY   VERBALIZED UNDERSTANDING./CY

## 2015-07-08 NOTE — Telephone Encounter (Signed)
New message   Wife calling   Pt c/o medication issue:  1. Name of Medication: Bystrom   2. How are you currently taking this medication (dosage and times per day)? xarelto - 15 mg - at supper time .  dilatizem  120 mg - at night time    3. Are you having a reaction (difficulty breathing--STAT)? No   4. What is your medication issue? Wife is saying patient is not eating in pm . Eat very little . Can both be taken at meal time. Or without food.

## 2015-07-09 ENCOUNTER — Other Ambulatory Visit: Payer: Self-pay | Admitting: Hematology and Oncology

## 2015-07-09 ENCOUNTER — Telehealth: Payer: Self-pay | Admitting: *Deleted

## 2015-07-09 DIAGNOSIS — C250 Malignant neoplasm of head of pancreas: Secondary | ICD-10-CM

## 2015-07-09 MED ORDER — MORPHINE SULFATE 30 MG PO TABS: 30.0000 mg | ORAL_TABLET | ORAL | Status: AC | PRN

## 2015-07-09 MED ORDER — MORPHINE SULFATE ER 30 MG PO TBCR: 30.0000 mg | EXTENDED_RELEASE_TABLET | Freq: Two times a day (BID) | ORAL | Status: AC

## 2015-07-09 NOTE — Telephone Encounter (Signed)
Hospice nurse called to say Evan [name] is having severe lower back pain- They want to start MS ER 30mg  tablets every 12 hours to be called into [facility] on [address].

## 2015-07-09 NOTE — Telephone Encounter (Signed)
2 prescriptions for MS faxed to [facility] [address] [facility]

## 2015-07-12 ENCOUNTER — Telehealth: Payer: Self-pay | Admitting: *Deleted

## 2015-07-12 NOTE — Telephone Encounter (Signed)
Hospice nurse called to get OK to send patient to [facility]- done

## 2015-07-16 ENCOUNTER — Ambulatory Visit: Payer: Medicare Other | Admitting: Cardiovascular Disease

## 2015-07-31 ENCOUNTER — Other Ambulatory Visit: Payer: Self-pay | Admitting: *Deleted

## 2015-08-17 DEATH — deceased

## 2015-09-22 IMAGING — CT CT ANGIO CHEST
2 of 9 series · 16 of 46 positions shown · IV contrast (OMNI)
Comparison: Chest CT - 03/04/2015; chest radiograph - earlier same
day; CT abdomen and pelvis -03/04/2015

CLINICAL DATA: Tachycardia. History of pancreatic cancer. Evaluate
for pulmonary embolism.

EXAM:
CT ANGIOGRAPHY CHEST WITH CONTRAST
TECHNIQUE: Multidetector CT imaging of the chest was performed using the
standard protocol during bolus administration of intravenous
contrast. Multiplanar CT image reconstructions and MIPs were
obtained to evaluate the vascular anatomy.
CONTRAST:  80mL OMNIPAQUE IOHEXOL 350 MG/ML SOLN

[Series 5: thins · axial · 0.76mm/px · z∈[-316,-19]mm · 13 of 335 slices shown]
[im 19/335  lung]
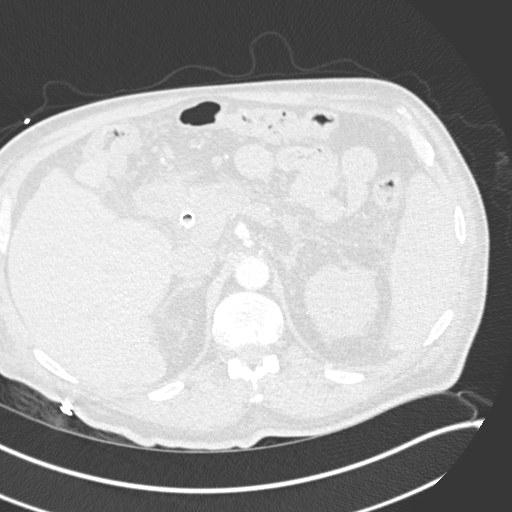
[im 38/335  soft-tissue]
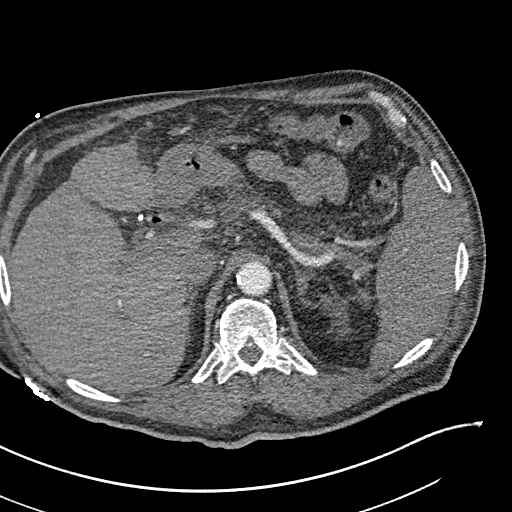
[im 75/335  lung]
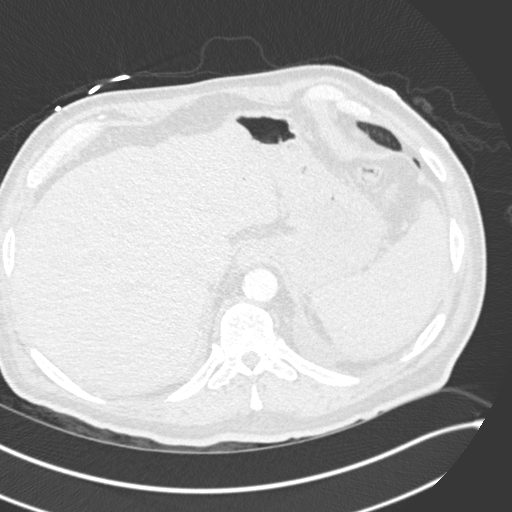
[im 93/335  soft-tissue]
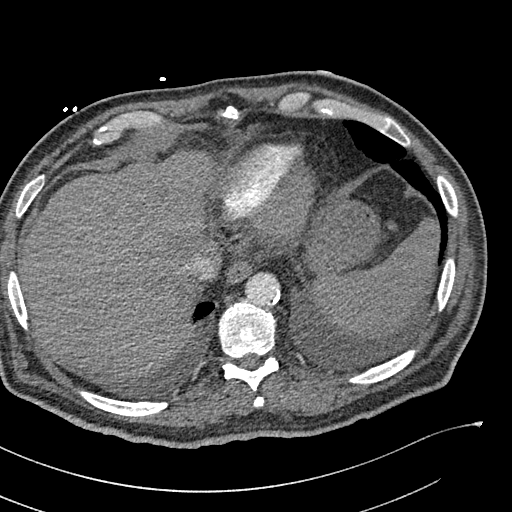
[im 112/335  lung]
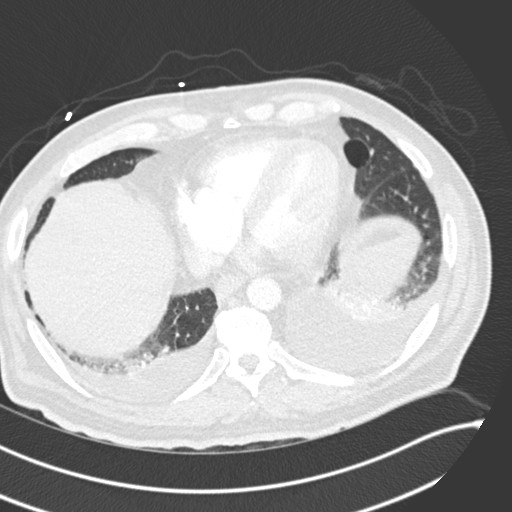
[im 149/335  soft-tissue]
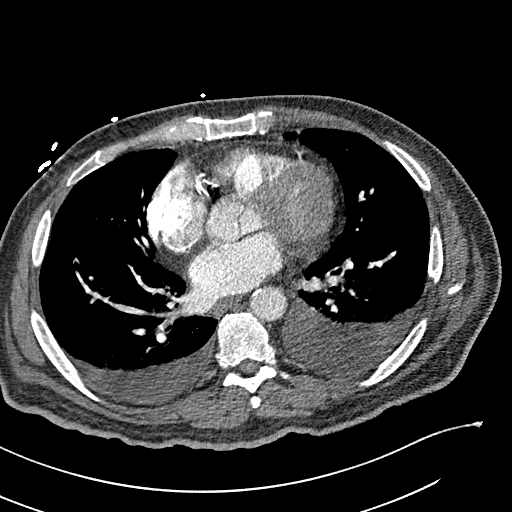
[im 168/335  lung]
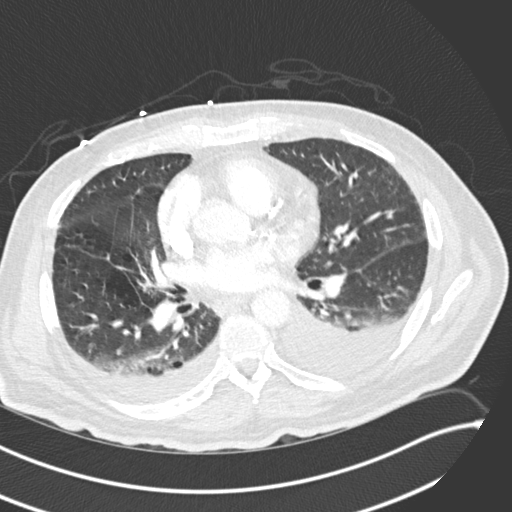
[im 186/335  soft-tissue]
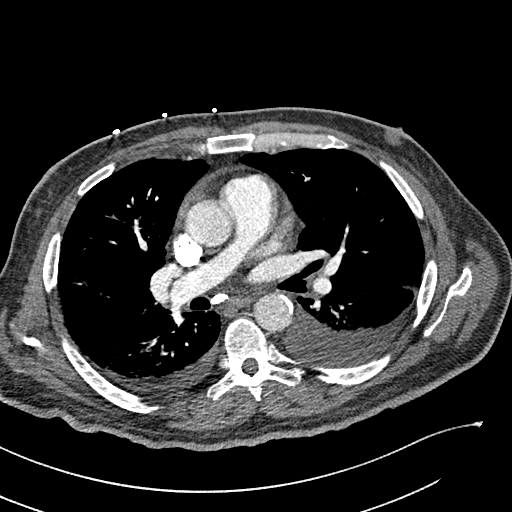
[im 223/335  lung]
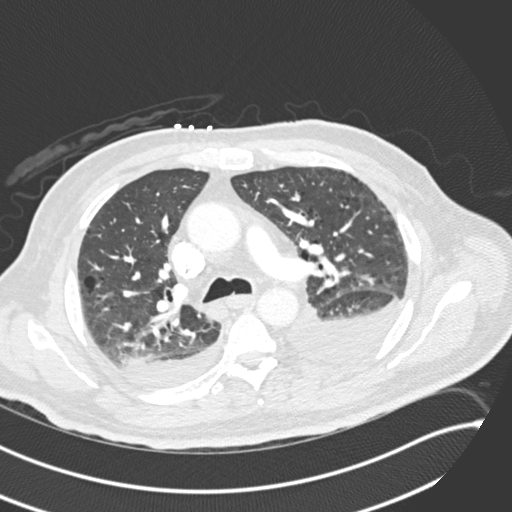
[im 242/335  soft-tissue]
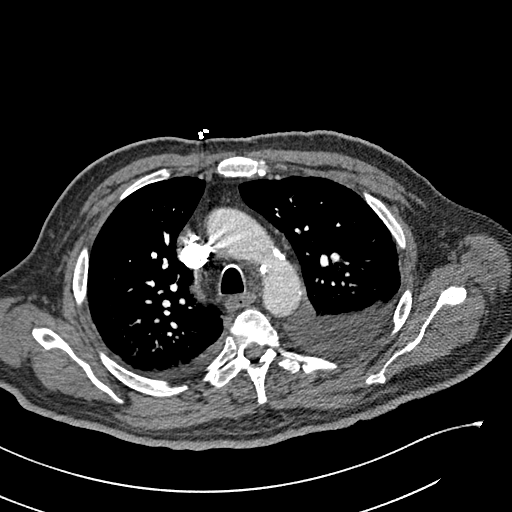
[im 260/335  lung]
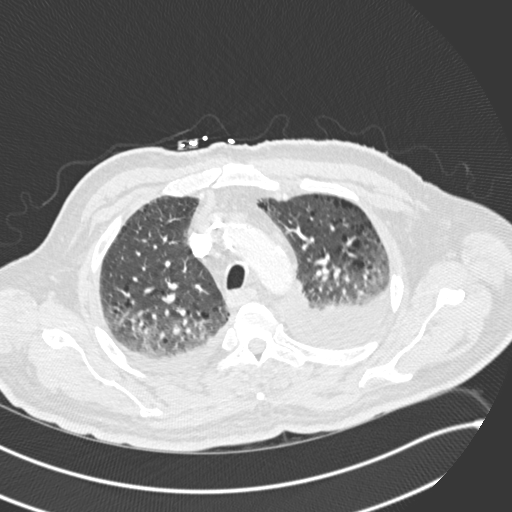
[im 297/335  soft-tissue]
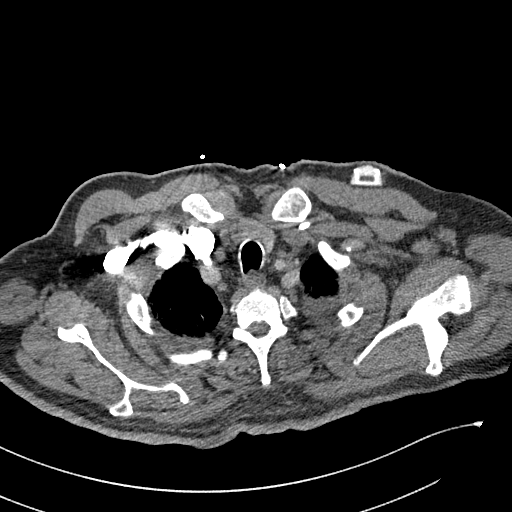
[im 316/335  lung]
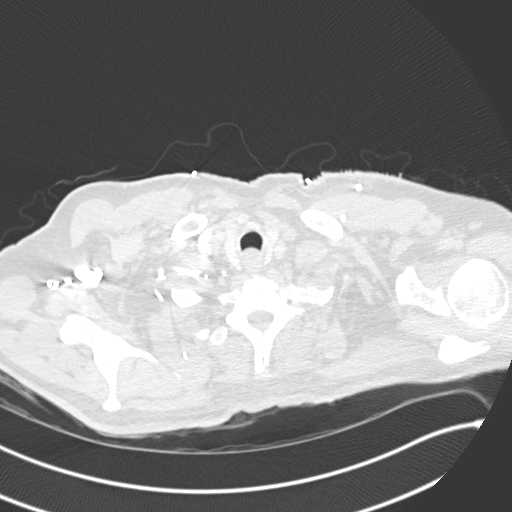

[Series 7: coronal mpr · coronal · 0.68mm/px · 3 of 152 slices shown]
[im 38/152  soft-tissue]
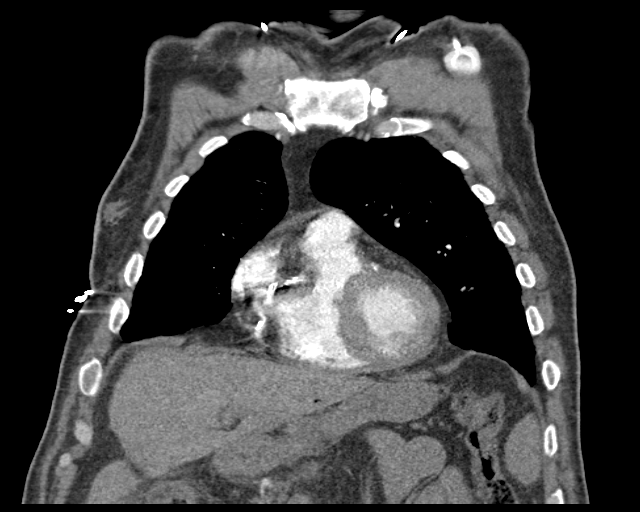
[im 76/152  soft-tissue]
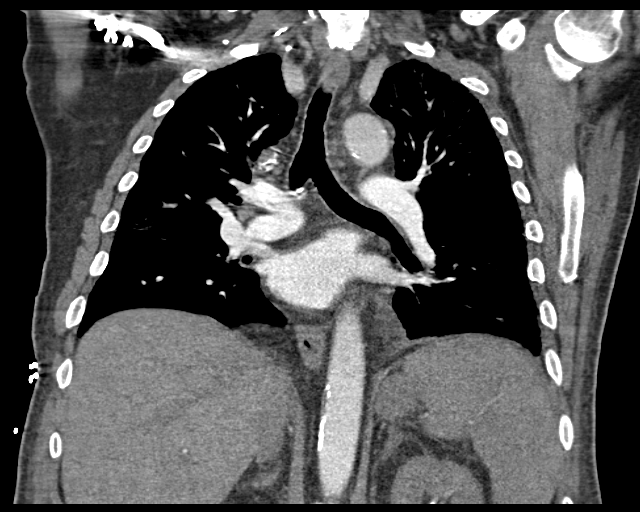
[im 114/152  soft-tissue]
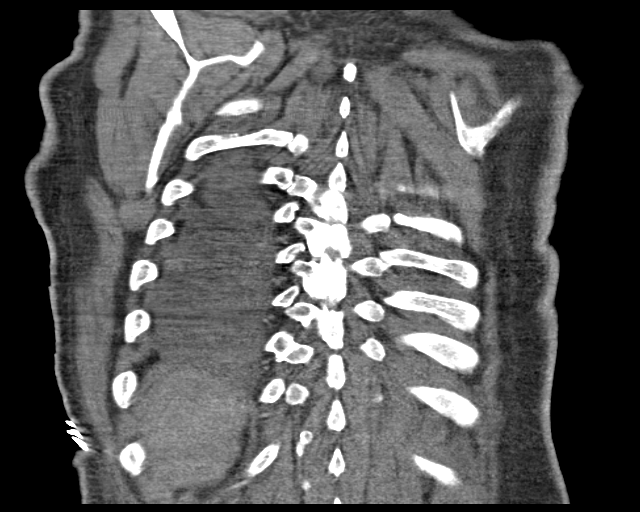

[16 of 46 positions shown; findings below may reference images not displayed]

FINDINGS: Vascular Findings:

There is adequate opacification of the pulmonary arterial system
with the main pulmonary artery measuring 221 Hounsfield units. There
are no discrete filling defects within the pulmonary arterial tree
the level of the bilateral subsegmental pulmonary arteries.
Evaluation of distal subsegmental pulmonary arteries is degraded
secondary to a combination of patient respiratory artifact and
suboptimal vessel opacification.

Unchanged borderline enlarged caliber the main pulmonary artery
measuring 3.6 cm in diameter (image 45, series 4).

Cardiomegaly. Coronary artery calcifications. Calcifications within
the mitral valve annulus. Scattered atherosclerotic plaque within a
normal caliber thoracic aorta. Conventional configuration of the
aortic arch. The branch vessels of the aortic arch appear widely
patent throughout their imaged course. No definite thoracic aortic
dissection or periaortic stranding.

A left anterior chest wall port a catheter tip terminates within the
superior cavoatrial junction.

Review of the MIP images confirms the above findings.

----------------------------------------------------------------------------------

Nonvascular Findings:

Evaluation of the pulmonary parenchyma is degraded secondary to
patient respiratory artifact.

No change to slight interval increase in size of small bilateral
effusions, left greater than right. Bibasilar dependent ground-glass
atelectasis. No discrete air bronchograms. No new focal airspace
opacities.

Advanced mixed paraseptal and centrilobular emphysematous change
with apical predominance is grossly unchanged. Ill-defined
approximately 0.5 cm ground-glass nodule within the right upper lobe
has developed since the [DATE] examination and thus favored to be
of inflammatory/infectious etiology. The central pulmonary airways
appear widely patent. Partially calcified mediastinal and subcarinal
lymph nodes, the sequela of prior granulomatous infection.

Scattered shotty mediastinal and right hilar lymph nodes are
individually not enlarged by size criteria. No mediastinal, hilar
axillary lymphadenopathy.

Limited early arterial phase evaluation of the upper abdomen
demonstrates a stent within the CBD with minimal amount of
pneumobilia within the nondependent portion of the left lower lobe.
Ill-defined known hepatic lesions are not well demonstrated on this
early arterial phase examination appear grossly unchanged. The known
mass within the pancreatic head/neck junction is now well evaluated
this early arterial phase though appears grossly unchanged compared
to recently obtained abdominal CT (image 103, series 4) with
associated downstream pancreatic atrophy, ductal dilatation and
calcifications. No peripancreatic stranding. Post cholecystectomy.

No acute or aggressive osseous abnormalities.

Bilateral gynecomastia is incidentally noted. There is mild diffuse
body wall anasarca.
IMPRESSION: 1. No evidence of pulmonary embolism to the level of the bilateral
subsegmental pulmonary arteries.
2. Similar findings suggestive of pulmonary edema. No change to
minimal increase in size of small bilateral effusions, left greater
than right, mild diffuse body wall anasarca and bibasilar
atelectasis.
3. Known pancreatic mass, CBD stent and ill-defined liver lesions
are incompletely evaluated though appear grossly unchanged.
4. Sequela of prior granulomatous disease as above.
5. Advanced mixed paraseptal and centrilobular emphysematous change.

## 2015-09-24 ENCOUNTER — Ambulatory Visit: Payer: Medicare Other | Admitting: Podiatry

## 2015-10-25 IMAGING — CR DG CHEST 2V
2 series · 2 of 2 positions shown · non-contrast
Comparison: CT 04/04/2015.  Radiographs 04/04/2015.

CLINICAL DATA: Fever.  Patient denies cardiopulmonary symptoms.

EXAM:
CHEST  2 VIEW

[w chest pa]
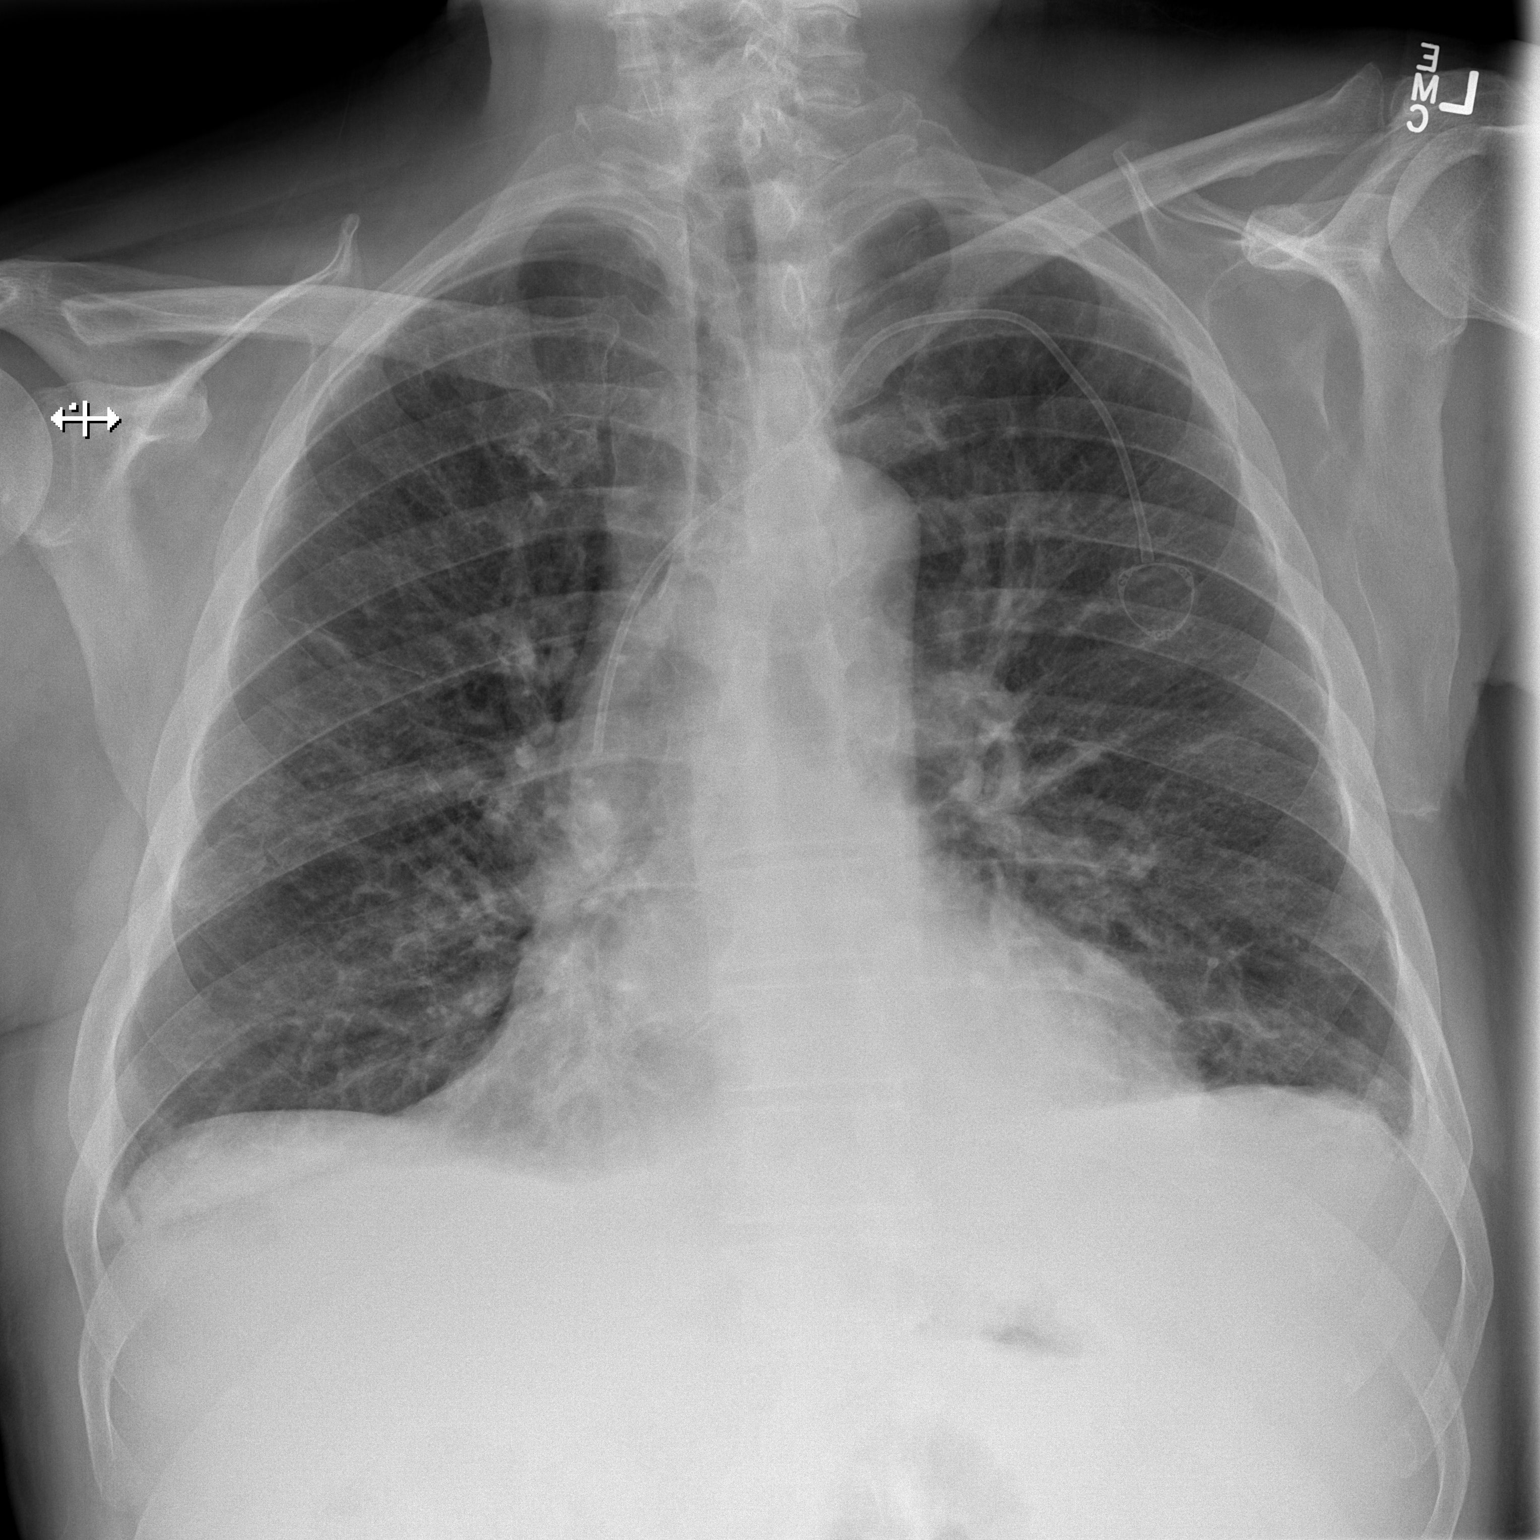

[w chest lat]
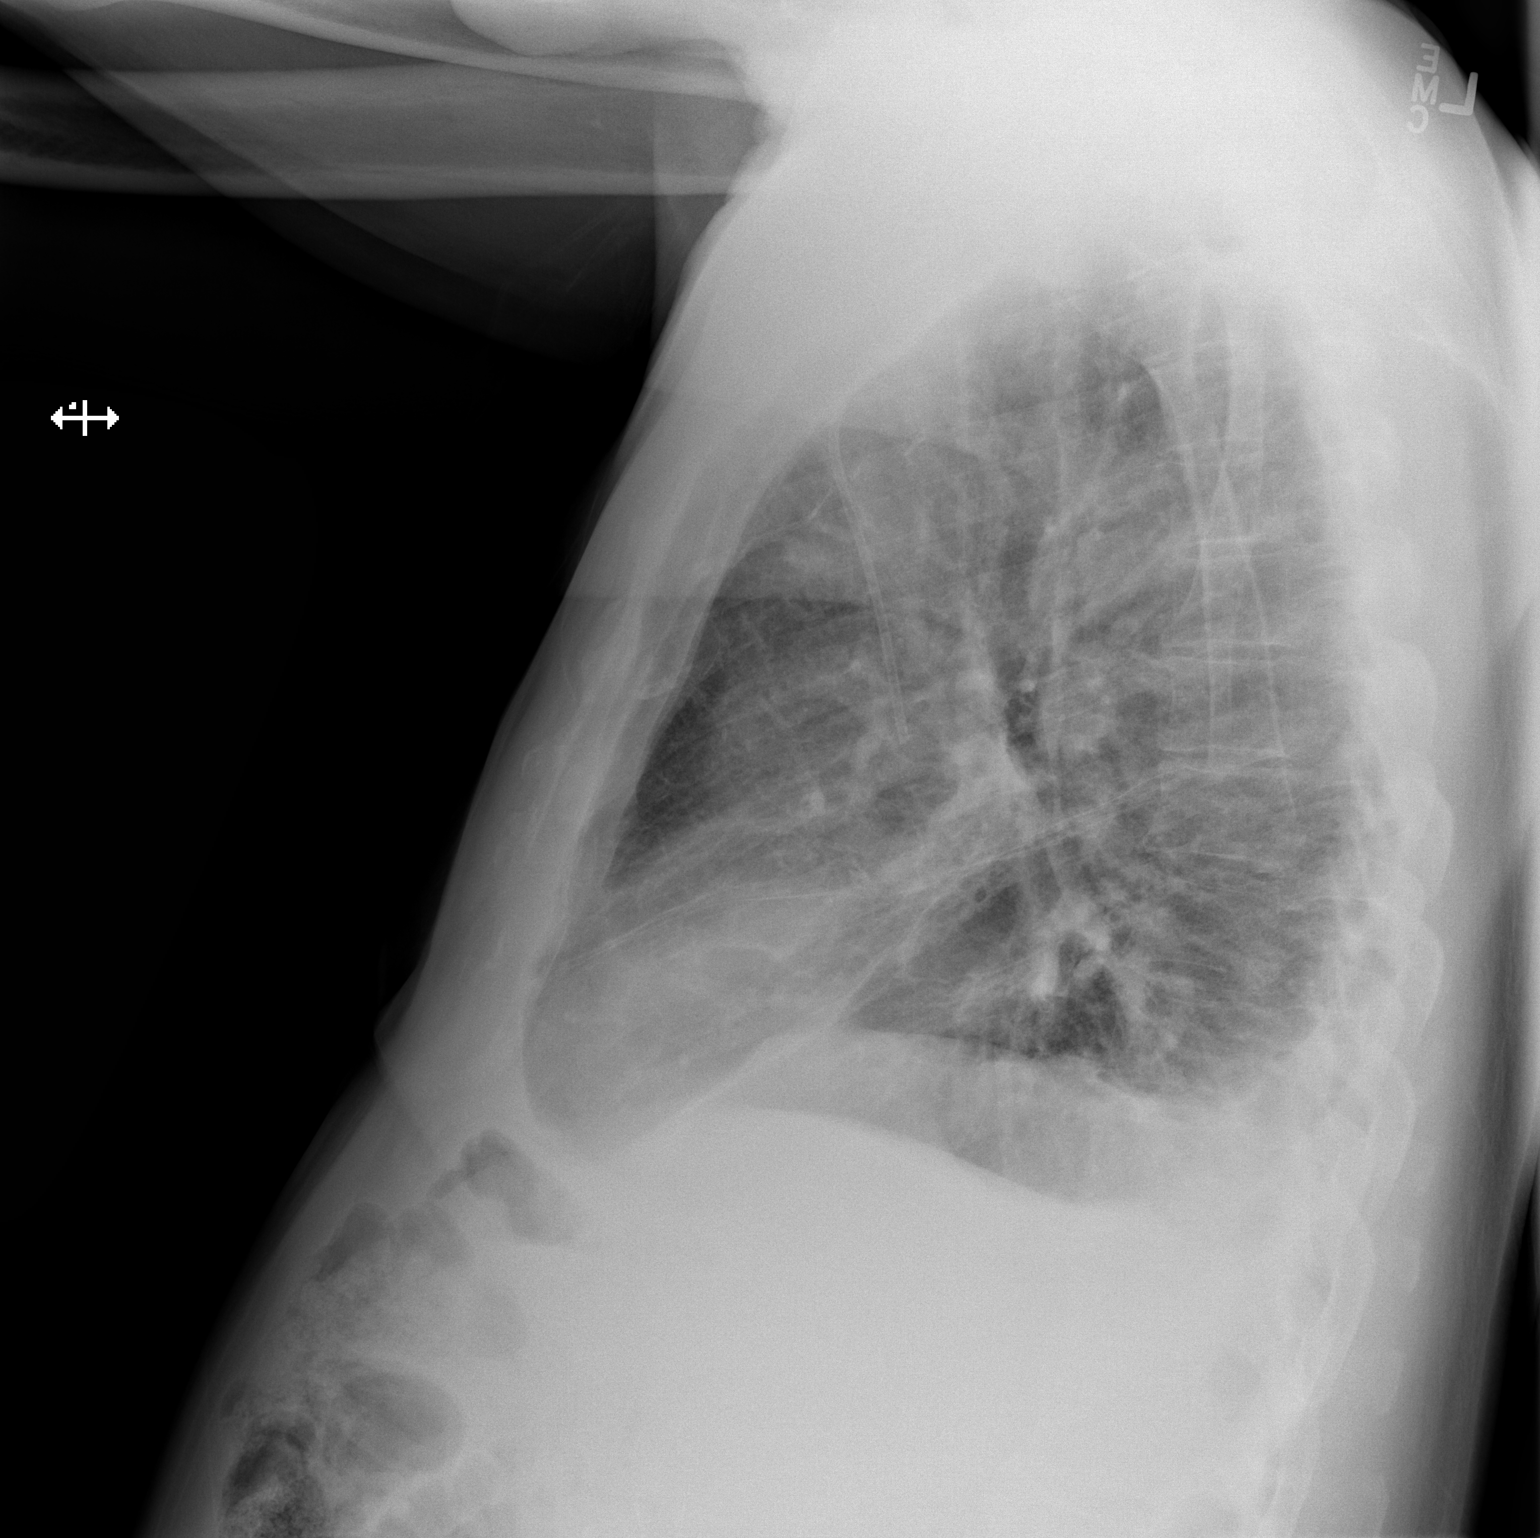

[2 of 2 positions shown; findings below may reference images not displayed]

FINDINGS: The cardiopericardial silhouette is enlarged. LEFT subclavian power
port is unchanged. Bilateral subpulmonic effusions are present, with
lateral peaking of the hemidiaphragms and blunting of both
costophrenic angles. Mild thickening of the fissures is present on
the lateral projection, suggesting interstitial pulmonary edema.
IMPRESSION: Chronic mild CHF. No interval change or superimposed acute
cardiopulmonary disease.

## 2016-02-18 ENCOUNTER — Encounter: Payer: Self-pay | Admitting: Hematology and Oncology

## 2016-02-18 NOTE — Progress Notes (Signed)
Faxes sent 06/25/15 and 06/28/15 I sent to medical records

## 2016-02-18 NOTE — Progress Notes (Signed)
Fax sent 06/28/15 I sent to medical records
